# Patient Record
Sex: Female | Born: 1995 | Race: White | Hispanic: No | Marital: Single | Smoking: Never smoker
Health system: Southern US, Community
[De-identification: ages and names within clinical notes are randomized; demographics above are authoritative.]

## PROBLEM LIST (undated history)

## (undated) ENCOUNTER — Inpatient Hospital Stay (HOSPITAL_COMMUNITY): Payer: Self-pay

## (undated) ENCOUNTER — Inpatient Hospital Stay: Payer: Self-pay

## (undated) DIAGNOSIS — O139 Gestational [pregnancy-induced] hypertension without significant proteinuria, unspecified trimester: Secondary | ICD-10-CM

## (undated) DIAGNOSIS — M519 Unspecified thoracic, thoracolumbar and lumbosacral intervertebral disc disorder: Secondary | ICD-10-CM

## (undated) DIAGNOSIS — M1990 Unspecified osteoarthritis, unspecified site: Secondary | ICD-10-CM

## (undated) DIAGNOSIS — E282 Polycystic ovarian syndrome: Secondary | ICD-10-CM

## (undated) HISTORY — DX: Polycystic ovarian syndrome: E28.2

## (undated) HISTORY — DX: Unspecified osteoarthritis, unspecified site: M19.90

---
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
## 2015-04-13 ENCOUNTER — Other Ambulatory Visit: Payer: Self-pay

## 2015-04-13 DIAGNOSIS — Z3041 Encounter for surveillance of contraceptive pills: Secondary | ICD-10-CM

## 2015-04-13 MED ORDER — DESOGESTREL-ETHINYL ESTRADIOL 0.15-30 MG-MCG PO TABS: 1.0000 | ORAL_TABLET | Freq: Every day | ORAL | Status: DC

## 2015-09-27 ENCOUNTER — Ambulatory Visit (INDEPENDENT_AMBULATORY_CARE_PROVIDER_SITE_OTHER): Payer: PRIVATE HEALTH INSURANCE | Admitting: Physician Assistant

## 2015-09-27 ENCOUNTER — Encounter: Payer: Self-pay | Admitting: Physician Assistant

## 2015-09-27 VITALS — BP 110/70 | HR 111 | Temp 98.4°F | Resp 16 | Wt 167.0 lb

## 2015-09-27 DIAGNOSIS — N63 Unspecified lump in unspecified breast: Secondary | ICD-10-CM | POA: Insufficient documentation

## 2015-09-27 DIAGNOSIS — S8290XA Unspecified fracture of unspecified lower leg, initial encounter for closed fracture: Secondary | ICD-10-CM | POA: Insufficient documentation

## 2015-09-27 DIAGNOSIS — J029 Acute pharyngitis, unspecified: Secondary | ICD-10-CM | POA: Diagnosis not present

## 2015-09-27 DIAGNOSIS — Z811 Family history of alcohol abuse and dependence: Secondary | ICD-10-CM | POA: Insufficient documentation

## 2015-09-27 DIAGNOSIS — J45909 Unspecified asthma, uncomplicated: Secondary | ICD-10-CM | POA: Insufficient documentation

## 2015-09-27 LAB — POCT RAPID STREP A (OFFICE): Rapid Strep A Screen: NEGATIVE

## 2015-09-27 MED ORDER — AMOXICILLIN 875 MG PO TABS: 875.0000 mg | ORAL_TABLET | Freq: Two times a day (BID) | ORAL | Status: DC

## 2015-09-27 MED ORDER — MAGIC MOUTHWASH W/LIDOCAINE: 5.0000 mL | Freq: Three times a day (TID) | ORAL | Status: DC | PRN

## 2015-09-27 NOTE — Patient Instructions (Signed)

## 2015-09-27 NOTE — Progress Notes (Signed)
Patient: Kimberly Farmer Female    DOB: 10-06-1996   19 y.o.   MRN: [id number] Visit Date: 09/27/2015  Today's Provider: Margaretann Loveless, PA-C   Chief Complaint  Patient presents with  . Sore Throat   Subjective:    Sore Throat  This is a new problem. The current episode started in the past 7 days. The problem has been gradually worsening. There has been no fever. Associated symptoms include headaches. Pertinent negatives include no congestion, coughing, diarrhea, ear pain, shortness of breath, swollen glands, trouble swallowing or vomiting. She has tried nothing for the symptoms.   She did previously undergo surgery for fracture and dislocation of the patella of the right knee. This was done on 09/22/2015. She states that for this surgery they did not put her under complete anesthesia and she was not intubated. She is not currently on any antibiotics postoperatively. She has been trying Chloraseptic spray without relief as well as cough drops. She states that yesterday her throat hurts her back she did not even feel like speaking. She states today is a little bit better because she is able to talk but she still having difficulty drinking liquids or eating. She states that to be able to drink or eat she has to use Chloraseptic Spray prior and then she is able to drink something.    Allergies  Allergen Reactions  . Hydrocodone Other (See Comments)    Other Reaction: SEVERE HA & GI Upset  . Oxycodone Other (See Comments)    Other Reaction: SEVERE HA & GI Upset  . Tape Rash   Previous Medications   ASPIRIN EC 325 MG TABLET    TAKE 1 TABLET (325 MG TOTAL) BY MOUTH ONCE DAILY.   DESOGESTREL-ETHINYL ESTRADIOL (APRI,EMOQUETTE,SOLIA) 0.15-30 MG-MCG TABLET    Take 1 tablet by mouth daily.   IBUPROFEN (ADVIL,MOTRIN) 200 MG TABLET    Take by mouth.   LEVONORGESTREL-ETHINYL ESTRADIOL (AVIANE) 0.1-20 MG-MCG TABLET    Take by mouth.   OXYCODONE (OXY IR/ROXICODONE) 5 MG IMMEDIATE  RELEASE TABLET    TAKE 1 TABLET BY MOUTH EVERY 4 HOURS AS NEEDED FOR PAIN FOR UP TO 20 DAYS    Review of Systems  Constitutional: Negative for fever, chills and fatigue.  HENT: Positive for sore throat. Negative for congestion, ear pain, postnasal drip, rhinorrhea, sinus pressure, sneezing, tinnitus, trouble swallowing and voice change.   Eyes: Negative.   Respiratory: Negative.  Negative for cough and shortness of breath.   Cardiovascular: Negative.   Gastrointestinal: Negative for nausea, vomiting and diarrhea.  Neurological: Positive for headaches. Negative for dizziness.  All other systems reviewed and are negative.   Social History  Substance Use Topics  . Smoking status: Never Smoker   . Smokeless tobacco: Never Used  . Alcohol Use: No   Objective:   BP 110/70 mmHg  Pulse 111  Temp(Src) 98.4 F (36.9 C) (Oral)  Resp 16  Wt 167 lb (75.751 kg)  LMP 09/15/2015  Physical Exam  Constitutional: She appears well-developed and well-nourished. No distress.  HENT:  Head: Normocephalic and atraumatic.  Right Ear: Hearing, tympanic membrane, external ear and ear canal normal.  Left Ear: Hearing, tympanic membrane, external ear and ear canal normal.  Nose: Nose normal. Right sinus exhibits no maxillary sinus tenderness and no frontal sinus tenderness. Left sinus exhibits no maxillary sinus tenderness and no frontal sinus tenderness.  Mouth/Throat: Uvula is midline and mucous membranes are normal. Posterior oropharyngeal erythema present.  No oropharyngeal exudate or posterior oropharyngeal edema.  Eyes: Conjunctivae and EOM are normal. Pupils are equal, round, and reactive to light. Right eye exhibits no discharge. Left eye exhibits no discharge.  Neck: Normal range of motion. Neck supple. No JVD present. No tracheal deviation present. No Brudzinski's sign and no Kernig's sign noted. No thyromegaly present.  Cardiovascular: Normal rate, regular rhythm and normal heart sounds.  Exam  reveals no gallop and no friction rub.   No murmur heard. Pulmonary/Chest: Effort normal and breath sounds normal. No stridor. No respiratory distress. She has no wheezes. She has no rales. She exhibits no tenderness.  Lymphadenopathy:    She has no cervical adenopathy.  Skin: Skin is warm and dry.        Assessment & Plan:     1. Pharyngitis Sore throat began 2 days after her surgery on the seventh. She states that it has been worsening until today. She has tried Chloraseptic spray without relief. Rapid strep today in the office was negative. I will go ahead and prophylactically treat or bacterial pharyngitis because of her recent hospitalization. I will treat with amoxicillin as below. I will also give dukes magic mouthwash with lidocaine as below for pain. I will also check a CBC to make sure her white count is not abnormal. Did advise her to please call the office if symptoms fail to improve or worsen over the next 7-10 days. She voiced understanding. - amoxicillin (AMOXIL) 875 MG tablet; Take 1 tablet (875 mg total) by mouth 2 (two) times daily.  Dispense: 20 tablet; Refill: 0 - magic mouthwash w/lidocaine SOLN; Take 5 mLs by mouth 3 (three) times daily as needed for mouth pain.  Dispense: 120 mL; Refill: 0  2. Sore throat See above medical treatment plan. - POCT rapid strep A - CBC with Differential       Margaretann LovelessJennifer M Levana Minetti, PA-C  [facility]

## 2015-09-28 ENCOUNTER — Telehealth: Payer: Self-pay

## 2015-09-28 DIAGNOSIS — J029 Acute pharyngitis, unspecified: Secondary | ICD-10-CM

## 2015-09-28 LAB — CBC WITH DIFFERENTIAL/PLATELET
Basophils Absolute: 0 10*3/uL (ref 0.0–0.2)
Basos: 0 %
EOS (ABSOLUTE): 0 10*3/uL (ref 0.0–0.4)
Eos: 0 %
Hematocrit: 38 % (ref 34.0–46.6)
Hemoglobin: 12.3 g/dL (ref 11.1–15.9)
Immature Grans (Abs): 0 10*3/uL (ref 0.0–0.1)
Immature Granulocytes: 0 %
Lymphocytes Absolute: 3.2 10*3/uL — ABNORMAL HIGH (ref 0.7–3.1)
Lymphs: 35 %
MCH: 23.8 pg — ABNORMAL LOW (ref 26.6–33.0)
MCHC: 32.4 g/dL (ref 31.5–35.7)
MCV: 74 fL — ABNORMAL LOW (ref 79–97)
Monocytes Absolute: 1.1 10*3/uL — ABNORMAL HIGH (ref 0.1–0.9)
Monocytes: 12 %
Neutrophils Absolute: 4.7 10*3/uL (ref 1.4–7.0)
Neutrophils: 53 %
Platelets: 345 10*3/uL (ref 150–379)
RBC: 5.17 x10E6/uL (ref 3.77–5.28)
RDW: 15.2 % (ref 12.3–15.4)
WBC: 9 10*3/uL (ref 3.4–10.8)

## 2015-09-28 MED ORDER — AMOXICILLIN 400 MG/5ML PO SUSR: 400.0000 mg | Freq: Three times a day (TID) | ORAL | Status: DC

## 2015-09-28 NOTE — Telephone Encounter (Signed)
Patient's mother advised as directed below.  Thanks,  -Jimmylee Ratterree 

## 2015-09-28 NOTE — Telephone Encounter (Signed)
Hi Antony ContrasJenni, Eyvonne MechanicSamantha Helser mother Amada Kingfisher(Jennifer Blankenburg) wants to know if the bacterial infection you diagnosed  Patient yesterday is contagious, if it is where does it comes from,does it comes from the surgery, what precautions to take as family. Also wants a liquid antibiotic for patient to take because patient cannot get the Pills in. FYI: I look to see if mother was in the consent but I was not able to find her. I Advised patient about results and asked if she had any questions patient stated no.   Please advise,  Thanks,  -Joseline

## 2015-09-28 NOTE — Telephone Encounter (Signed)
Could be from surgery but unknown source.  As long as she continues to be afebrile most likely not contagious, also considered not contagious 24 hrs after starting antibiotic.  Good hand hygiene and lysol should help stopping spread.  [facility] [place]

## 2015-09-28 NOTE — Telephone Encounter (Signed)
-----   Message from Margaretann LovelessJennifer M Burnette, PA-C sent at 09/28/2015  9:13 AM EST ----- WBC is stable and WNL.

## 2015-09-28 NOTE — Telephone Encounter (Signed)
Patient advised as directed below.  Thanks,  -Joseline 

## 2016-01-05 ENCOUNTER — Other Ambulatory Visit: Payer: Self-pay | Admitting: Physician Assistant

## 2016-04-24 DIAGNOSIS — G8929 Other chronic pain: Secondary | ICD-10-CM | POA: Insufficient documentation

## 2016-04-24 DIAGNOSIS — M545 Low back pain, unspecified: Secondary | ICD-10-CM | POA: Insufficient documentation

## 2016-05-12 ENCOUNTER — Ambulatory Visit (INDEPENDENT_AMBULATORY_CARE_PROVIDER_SITE_OTHER): Payer: PRIVATE HEALTH INSURANCE | Admitting: Physician Assistant

## 2016-05-12 ENCOUNTER — Encounter: Payer: Self-pay | Admitting: Physician Assistant

## 2016-05-12 VITALS — BP 128/70 | HR 94 | Temp 98.8°F | Resp 16 | Wt 205.6 lb

## 2016-05-12 DIAGNOSIS — Z3041 Encounter for surveillance of contraceptive pills: Secondary | ICD-10-CM

## 2016-05-12 DIAGNOSIS — N83202 Unspecified ovarian cyst, left side: Secondary | ICD-10-CM | POA: Diagnosis not present

## 2016-05-12 MED ORDER — DESOGESTREL-ETHINYL ESTRADIOL 0.15-30 MG-MCG PO TABS: 1.0000 | ORAL_TABLET | Freq: Every day | ORAL | 3 refills | Status: DC

## 2016-05-12 NOTE — Progress Notes (Signed)
       Patient: Kimberly Farmer Female    DOB: [birth year]   20 y.o.   MRN: [id number] Visit Date: 05/12/2016  Today's Provider: Margaretann Loveless, PA-C   Chief Complaint  Patient presents with  . Discuss MRI results   Subjective:    HPI Patient is here to Discuss MRI results.She reports that she wants to talk about the Ovarian Cyst that was noted incidentally on the MRI. She reports her periods have always been irregular, but have been controlled on her OCP. Now she reports that she has notice more cramping during her menstrual cycle. She is not sure if there is any family history of ovarian cyst, uterine fibroids or endometriosis. No history of PCOS, uterine, cervical or ovarian cancer.    Allergies  Allergen Reactions  . Hydrocodone Other (See Comments)    Other Reaction: SEVERE HA & GI Upset  . Oxycodone Other (See Comments)    Other Reaction: SEVERE HA & GI Upset  . Tape Rash   Current Meds  Medication Sig  . desogestrel-ethinyl estradiol (APRI,EMOQUETTE,SOLIA) 0.15-30 MG-MCG tablet Take 1 tablet by mouth daily.    Review of Systems  Constitutional: Negative.   Respiratory: Negative.   Cardiovascular: Negative for chest pain, palpitations and leg swelling.  Gastrointestinal: Negative for abdominal pain, constipation, diarrhea, nausea and vomiting.  Genitourinary: Positive for menstrual problem (More cramping). Negative for dyspareunia, dysuria, flank pain, genital sores, vaginal bleeding, vaginal discharge and vaginal pain.  Musculoskeletal: Negative for back pain.    Social History  Substance Use Topics  . Smoking status: Never Smoker  . Smokeless tobacco: Never Used  . Alcohol use No   Objective:   BP 128/70 (BP Location: Left Arm, Patient Position: Sitting, Cuff Size: Normal)   Pulse 94   Temp 98.8 F (37.1 C) (Oral)   Resp 16   Wt 205 lb 9.6 oz (93.3 kg)   LMP 05/01/2016   Physical Exam  Constitutional: She is oriented to person, place, and time. She  appears well-developed and well-nourished. No distress.  Cardiovascular: Normal rate, regular rhythm and normal heart sounds.  Exam reveals no gallop and no friction rub.   No murmur heard. Pulmonary/Chest: Effort normal and breath sounds normal. No respiratory distress. She has no wheezes. She has no rales.  Abdominal: Soft. Normal appearance and bowel sounds are normal. She exhibits no distension and no mass. There is no hepatosplenomegaly. There is no tenderness. There is no rebound, no guarding and no CVA tenderness.  Neurological: She is alert and oriented to person, place, and time.  Skin: Skin is warm and dry. She is not diaphoretic.  Vitals reviewed.      Assessment & Plan:     1. Cyst of left ovary Will order future [place] for recheck in size of the ovarian cyst vs clearance. Orders were placed as below and do not need to be scheduled for 6-8 weeks. Will follow up pending results.  - US Pelvis Complete; Future - US Transvaginal Non-OB; Future  2. Encounter for surveillance of contraceptive pills Stable. Diagnosis pulled for medication refill. Continue current medical treatment plan. - desogestrel-ethinyl estradiol (APRI,EMOQUETTE,SOLIA) 0.15-30 MG-MCG tablet; Take 1 tablet by mouth daily.  Dispense: 3 Package; Refill: 3       Margaretann Loveless, PA-C  [facility]

## 2016-05-12 NOTE — Patient Instructions (Signed)
Ovarian Cyst An ovarian cyst is a fluid-filled sac that forms on an ovary. The ovaries are small organs that produce eggs in women. Various types of cysts can form on the ovaries. Most are not cancerous. Many do not cause problems, and they often go away on their own. Some may cause symptoms and require treatment. Common types of ovarian cysts include:  Functional cysts--These cysts may occur every month during the menstrual cycle. This is normal. The cysts usually go away with the next menstrual cycle if the woman does not get pregnant. Usually, there are no symptoms with a functional cyst.  Endometrioma cysts--These cysts form from the tissue that lines the uterus. They are also called "chocolate cysts" because they become filled with blood that turns brown. This type of cyst can cause pain in the lower abdomen during intercourse and with your menstrual period.  Cystadenoma cysts--This type develops from the cells on the outside of the ovary. These cysts can get very big and cause lower abdomen pain and pain with intercourse. This type of cyst can twist on itself, cut off its blood supply, and cause severe pain. It can also easily rupture and cause a lot of pain.  Dermoid cysts--This type of cyst is sometimes found in both ovaries. These cysts may contain different kinds of body tissue, such as skin, teeth, hair, or cartilage. They usually do not cause symptoms unless they get very big.  Theca lutein cysts--These cysts occur when too much of a certain hormone (human chorionic gonadotropin) is produced and overstimulates the ovaries to produce an egg. This is most common after procedures used to assist with the conception of a baby (in vitro fertilization). CAUSES   Fertility drugs can cause a condition in which multiple large cysts are formed on the ovaries. This is called ovarian hyperstimulation syndrome.  A condition called polycystic ovary syndrome can cause hormonal imbalances that can lead to  nonfunctional ovarian cysts. SIGNS AND SYMPTOMS  Many ovarian cysts do not cause symptoms. If symptoms are present, they may include:  Pelvic pain or pressure.  Pain in the lower abdomen.  Pain during sexual intercourse.  Increasing girth (swelling) of the abdomen.  Abnormal menstrual periods.  Increasing pain with menstrual periods.  Stopping having menstrual periods without being pregnant. DIAGNOSIS  These cysts are commonly found during a routine or annual pelvic exam. Tests may be ordered to find out more about the cyst. These tests may include:  Ultrasound.  X-ray of the pelvis.  CT scan.  MRI.  Blood tests. TREATMENT  Many ovarian cysts go away on their own without treatment. Your health care provider may want to check your cyst regularly for 2-3 months to see if it changes. For women in menopause, it is particularly important to monitor a cyst closely because of the higher rate of ovarian cancer in menopausal women. When treatment is needed, it may include any of the following:  A procedure to drain the cyst (aspiration). This may be done using a long needle and ultrasound. It can also be done through a laparoscopic procedure. This involves using a thin, lighted tube with a tiny camera on the end (laparoscope) inserted through a small incision.  Surgery to remove the whole cyst. This may be done using laparoscopic surgery or an open surgery involving a larger incision in the lower abdomen.  Hormone treatment or birth control pills. These methods are sometimes used to help dissolve a cyst. HOME CARE INSTRUCTIONS   Only take over-the-counter   or prescription medicines as directed by your health care provider.  Follow up with your health care provider as directed.  Get regular pelvic exams and Pap tests. SEEK MEDICAL CARE IF:   Your periods are late, irregular, or painful, or they stop.  Your pelvic pain or abdominal pain does not go away.  Your abdomen becomes  larger or swollen.  You have pressure on your bladder or trouble emptying your bladder completely.  You have pain during sexual intercourse.  You have feelings of fullness, pressure, or discomfort in your stomach.  You lose weight for no apparent reason.  You feel generally ill.  You become constipated.  You lose your appetite.  You develop acne.  You have an increase in body and facial hair.  You are gaining weight, without changing your exercise and eating habits.  You think you are pregnant. SEEK IMMEDIATE MEDICAL CARE IF:   You have increasing abdominal pain.  You feel sick to your stomach (nauseous), and you throw up (vomit).  You develop a fever that comes on suddenly.  You have abdominal pain during a bowel movement.  Your menstrual periods become heavier than usual. MAKE SURE YOU:  Understand these instructions.  Will watch your condition.  Will get help right away if you are not doing well or get worse.   This information is not intended to replace advice given to you by your health care provider. Make sure you discuss any questions you have with your health care provider.   Document Released: 10/02/2005 Document Revised: 10/07/2013 Document Reviewed: 06/09/2013 Elsevier Interactive Patient Education 2016 Elsevier Inc.  

## 2016-05-24 ENCOUNTER — Ambulatory Visit: Payer: 59

## 2016-07-26 ENCOUNTER — Encounter: Payer: Self-pay | Admitting: Physician Assistant

## 2016-07-26 ENCOUNTER — Ambulatory Visit (INDEPENDENT_AMBULATORY_CARE_PROVIDER_SITE_OTHER): Payer: PRIVATE HEALTH INSURANCE | Admitting: Physician Assistant

## 2016-07-26 VITALS — BP 110/70 | HR 68 | Temp 98.4°F | Resp 16 | Ht 66.0 in | Wt 216.0 lb

## 2016-07-26 DIAGNOSIS — N912 Amenorrhea, unspecified: Secondary | ICD-10-CM | POA: Diagnosis not present

## 2016-07-26 DIAGNOSIS — N83202 Unspecified ovarian cyst, left side: Secondary | ICD-10-CM

## 2016-07-26 LAB — POCT URINE PREGNANCY: Preg Test, Ur: NEGATIVE

## 2016-07-26 NOTE — Patient Instructions (Signed)
Primary Amenorrhea Primary amenorrhea is the absence of any menstrual flow in a female by the age of 15 years. An average age for the start of menstruation is the age of 12 years. Primary amenorrhea is not considered to have occurred until a female is older than 15 years and has never menstruated. This may occur with or without other signs of puberty. CAUSES  Some common causes of not menstruating include:  Chromosomal abnormality causing the ovaries to malfunction is the most common cause of primary amenorrhea.  Malnutrition.  Low blood sugar (hypoglycemia).  Polycystic ovary syndrome (cysts in the ovaries, not ovulating).  Absence of the vagina, uterus, or ovaries since birth (congenital).  Extreme obesity.  Cystic fibrosis.  Drastic weight loss from any cause.  Over-exercising (running, biking) causing loss of body fat.  Pituitary gland tumor in the brain.  Long-term (chronic) illnesses.  Cushing disease.  Thyroid disease (hypothyroidism, hyperthyroidism).  Part of the brain (hypothalamus) not functioning normally.  Premature ovarian failure. SYMPTOMS  No menstruation by age 15 years in normally developed females is the primary symptom. Other symptoms may include:  Discharge from the breasts.  Hot flashes.  Adult acne.  Facial or chest hair.  Headaches.  Impaired vision.  Recent stress.  Changes in weight, diet, or exercise patterns. DIAGNOSIS  Primary amenorrhea is diagnosed with the help of a medical history and a physical exam. Other tests that may be recommended include:  Blood tests to check for pregnancy, hormonal changes, a bleeding or thyroid disorder, low iron levels (anemia), or other problems.  Urine tests.  Specialized X-ray exams. TREATMENT  Treatment will depend on the cause. For example, some of the causes of primary amenorrhea, such as congenital absence of sex organs, will require surgery to correct. Others may respond to treatment  with medicine. SEEK MEDICAL CARE IF:  There has not been any menstrual flow by age 15 years.  Body maturation does not occur at a level typical of peers.  Pelvic area pain occurs.  There is unusual weight gain or hair growth.   This information is not intended to replace advice given to you by your health care provider. Make sure you discuss any questions you have with your health care provider.   Document Released: 10/02/2005 Document Revised: 10/23/2014 Document Reviewed: 05/14/2013 Elsevier Interactive Patient Education 2016 Elsevier Inc.  

## 2016-07-26 NOTE — Progress Notes (Signed)
       Patient: Kimberly MainSamantha K Mcmurry Female    DOB: 03/18/1996   20 y.o.   MRN: [id number] Visit Date: 07/26/2016  Today's Provider: Margaretann LovelessJennifer M Burnette, PA-C   Chief Complaint  Patient presents with  . Menstrual Problem   Subjective:    HPI Patient c/o of not having her period for 2 months. Patient reports last period started on 06/01/2016. Patient reports that she has done home pregnancy test and have been negative. Patient denies any symptoms today. Patient reports history of cyst on left ovary. She is taking OCP regularly.    Allergies  Allergen Reactions  . Hydrocodone Other (See Comments)    Other Reaction: SEVERE HA & GI Upset  . Oxycodone Other (See Comments)    Other Reaction: SEVERE HA & GI Upset  . Tape Rash     Current Outpatient Prescriptions:  .  desogestrel-ethinyl estradiol (APRI,EMOQUETTE,SOLIA) 0.15-30 MG-MCG tablet, Take 1 tablet by mouth daily., Disp: 3 Package, Rfl: 3 .  ibuprofen (ADVIL,MOTRIN) 200 MG tablet, Take by mouth., Disp: , Rfl:   Review of Systems  Constitutional: Negative.   Respiratory: Negative.   Cardiovascular: Negative.   Gastrointestinal: Negative.   Genitourinary: Positive for menstrual problem. Negative for dyspareunia, dysuria, flank pain, genital sores, hematuria, pelvic pain, vaginal bleeding, vaginal discharge and vaginal pain.  Musculoskeletal: Negative for back pain.    Social History  Substance Use Topics  . Smoking status: Never Smoker  . Smokeless tobacco: Never Used  . Alcohol use No   Objective:   BP 110/70 (BP Location: Left Arm, Patient Position: Sitting, Cuff Size: Large)   Pulse 68   Temp 98.4 F (36.9 C) (Oral)   Resp 16   Ht 5\' 6"  (1.676 m)   Wt 216 lb (98 kg)   LMP 06/01/2016 (Exact Date)   SpO2 99%   BMI 34.86 kg/m   Physical Exam  Constitutional: She is oriented to person, place, and time. She appears well-developed and well-nourished. No distress.  Cardiovascular: Normal rate, regular rhythm and  normal heart sounds.  Exam reveals no gallop and no friction rub.   No murmur heard. Pulmonary/Chest: Effort normal and breath sounds normal. No respiratory distress. She has no wheezes. She has no rales.  Abdominal: Soft. Normal appearance and bowel sounds are normal. She exhibits no distension and no mass. There is no hepatosplenomegaly. There is no tenderness. There is no rebound, no guarding and no CVA tenderness.  Neurological: She is alert and oriented to person, place, and time.  Skin: Skin is warm and dry. She is not diaphoretic.  Vitals reviewed.     Assessment & Plan:     1. Amenorrhea Urine pregnancy today was negative. Will still check HCG blood test. I will get US to R/O possible ovarian cysts or fibroid as cause for amenorrhea. - POCT urine pregnancy - US Pelvis Complete; Future - US Transvaginal Non-OB; Future - hCG, serum, qualitative  2. Cyst of left ovary H/O this noted on abdominal CT. Will get US to see if still present. - US Pelvis Complete; Future - US Transvaginal Non-OB; Future       Margaretann LovelessJennifer M Burnette, PA-C  [facility]

## 2016-07-27 ENCOUNTER — Telehealth: Payer: Self-pay

## 2016-07-27 LAB — HCG, SERUM, QUALITATIVE: hCG,Beta Subunit,Qual,Serum: NEGATIVE m[IU]/mL (ref <6)

## 2016-07-27 NOTE — Telephone Encounter (Signed)
-----   Message from Margaretann LovelessJennifer M Burnette, PA-C sent at 07/27/2016  8:43 AM EDT ----- Hcg blood test negative. Will f/u with US as discussed.

## 2016-07-27 NOTE — Telephone Encounter (Signed)
Advised pt of lab results. Pt verbally acknowledges understanding. Emily Drozdowski, CMA   

## 2016-07-28 ENCOUNTER — Ambulatory Visit: Payer: 59

## 2016-10-05 ENCOUNTER — Encounter: Payer: Self-pay | Admitting: Emergency Medicine

## 2016-10-05 ENCOUNTER — Emergency Department
Admission: EM | Admit: 2016-10-05 | Discharge: 2016-10-05 | Disposition: A | Discharge status: Home / Self Care | Payer: 59 | Attending: Emergency Medicine | Admitting: Emergency Medicine

## 2016-10-05 ENCOUNTER — Emergency Department: Payer: 59

## 2016-10-05 DIAGNOSIS — O200 Threatened abortion: Secondary | ICD-10-CM | POA: Diagnosis not present

## 2016-10-05 DIAGNOSIS — R102 Pelvic and perineal pain: Secondary | ICD-10-CM | POA: Insufficient documentation

## 2016-10-05 DIAGNOSIS — Z3A01 Less than 8 weeks gestation of pregnancy: Secondary | ICD-10-CM | POA: Insufficient documentation

## 2016-10-05 DIAGNOSIS — O26899 Other specified pregnancy related conditions, unspecified trimester: Secondary | ICD-10-CM

## 2016-10-05 DIAGNOSIS — O209 Hemorrhage in early pregnancy, unspecified: Secondary | ICD-10-CM | POA: Diagnosis present

## 2016-10-05 DIAGNOSIS — J45909 Unspecified asthma, uncomplicated: Secondary | ICD-10-CM | POA: Insufficient documentation

## 2016-10-05 LAB — CBC
HCT: 37.6 % (ref 35.0–47.0)
Hemoglobin: 12.2 g/dL (ref 12.0–16.0)
MCH: 25.3 pg — ABNORMAL LOW (ref 26.0–34.0)
MCHC: 32.5 g/dL (ref 32.0–36.0)
MCV: 78 fL — ABNORMAL LOW (ref 80.0–100.0)
Platelets: 313 10*3/uL (ref 150–440)
RBC: 4.82 MIL/uL (ref 3.80–5.20)
RDW: 15.5 % — ABNORMAL HIGH (ref 11.5–14.5)
WBC: 12.4 10*3/uL — ABNORMAL HIGH (ref 3.6–11.0)

## 2016-10-05 LAB — BASIC METABOLIC PANEL
Anion gap: 7 (ref 5–15)
BUN: 7 mg/dL (ref 6–20)
CO2: 25 mmol/L (ref 22–32)
Calcium: 9.3 mg/dL (ref 8.9–10.3)
Chloride: 105 mmol/L (ref 101–111)
Creatinine, Ser: 0.46 mg/dL (ref 0.44–1.00)
GFR calc Af Amer: >60 mL/min (ref >60)
GFR calc non Af Amer: >60 mL/min (ref >60)
Glucose, Bld: 97 mg/dL (ref 65–99)
Potassium: 3.5 mmol/L (ref 3.5–5.1)
Sodium: 137 mmol/L (ref 135–145)

## 2016-10-05 LAB — URINALYSIS, COMPLETE (UACMP) WITH MICROSCOPIC
Bacteria, UA: NONE SEEN
Bilirubin Urine: NEGATIVE
Glucose, UA: NEGATIVE mg/dL
Hgb urine dipstick: NEGATIVE
Ketones, ur: NEGATIVE mg/dL
Leukocytes, UA: NEGATIVE
Nitrite: NEGATIVE
Protein, ur: NEGATIVE mg/dL
RBC / HPF: NONE SEEN RBC/hpf (ref 0–5)
Specific Gravity, Urine: 1.018 (ref 1.005–1.030)
pH: 6 (ref 5.0–8.0)

## 2016-10-05 LAB — HCG, QUANTITATIVE, PREGNANCY: hCG, Beta Chain, Quant, S: 87950 m[IU]/mL — ABNORMAL HIGH (ref <5)

## 2016-10-05 LAB — ABO/RH: ABO/RH(D): O POS

## 2016-10-05 NOTE — ED Triage Notes (Signed)
Pt in with co lower abd cramping pt is 10-[redacted] weeks pregnant and has had cramping the entire pregnancy. States today pain worsened and noted small of vag bleeding.

## 2016-10-05 NOTE — ED Provider Notes (Signed)
[facility] Emergency Department Provider Note        Time seen: ----------------------------------------- 10:32 PM on 10/05/2016 -----------------------------------------    I have reviewed the triage vital signs and the nursing notes.   HISTORY  Chief Complaint Abdominal Pain    HPI Annie MainSamantha K Seiple is a 20 y.o. female who presents the ER for lower abdominal cramping in the first trimester pregnancy. Patient thinks she is around 10-[redacted] weeks pregnant has had cramping nature pregnancy. She reports a history of polycystic ovaries. She states today the pain is worsened and she noticed a small amount of vaginal bleeding. She denies fevers, chills, chest pain or other complaints.   No past medical history on file.  Patient Active Problem List   Diagnosis Date Noted  . Chronic midline low back pain without sciatica 04/24/2016  . 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 Breast lump 09/27/2015  . F/H of alcoholism 09/27/2015  . Broken leg 09/27/2015  . Contraception 04/13/2015  . Bone disease 09/24/2014  . 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 Idiopathic avascular necrosis 01/10/2012    Past Surgical History:  Procedure Laterality Date  . KNEE SURGERY Left 2011   [name] has had 5 surgeries on her knee  . TONSILLECTOMY AND ADENOIDECTOMY  2007    Allergies Hydrocodone; Oxycodone; and Tape  Social History Social History  Substance Use Topics  . Smoking status: Never Smoker  . Smokeless tobacco: Never Used  . Alcohol use No    Review of Systems Constitutional: Negative for fever. Cardiovascular: Negative for chest pain. Respiratory: Negative for shortness of breath. Gastrointestinal: Positive for abdominal pain Genitourinary: Negative for dysuria. positive for vaginal bleeding Musculoskeletal: Negative for back pain. Skin: Negative for rash. Neurological: Negative for headaches, focal weakness or numbness.  10-point ROS otherwise  negative.  ____________________________________________   PHYSICAL EXAM:  VITAL SIGNS: ED Triage Vitals  Enc Vitals Group     BP 10/05/16 2033 123/75     Pulse Rate 10/05/16 2033 98     Resp 10/05/16 2033 17     Temp 10/05/16 2033 98.1 F (36.7 C)     Temp Source 10/05/16 2033 Oral     SpO2 10/05/16 2033 100 %     Weight 10/05/16 2033 217 lb (98.4 kg)     Height 10/05/16 2033 5\' 6"  (1.676 m)     Head Circumference --      Peak Flow --      Pain Score 10/05/16 2038 5     Pain Loc --      Pain Edu? --      Excl. in GC? --     Constitutional: Alert and oriented. Well appearing and in no distress. Eyes: Conjunctivae are normal. PERRL. Normal extraocular movements. ENT   Head: Normocephalic and atraumatic.   Nose: No congestion/rhinnorhea.   Mouth/Throat: Mucous membranes are moist.   Neck: No stridor. Cardiovascular: Normal rate, regular rhythm. No murmurs, rubs, or gallops. Respiratory: Normal respiratory effort without tachypnea nor retractions. Breath sounds are clear and equal bilaterally. No wheezes/rales/rhonchi. Gastrointestinal: Soft and nontender. Normal bowel sounds Musculoskeletal: Nontender with normal range of motion in all extremities. No lower extremity tenderness nor edema. Neurologic:  Normal speech and language. No gross focal neurologic deficits are appreciated.  Skin:  Skin is warm, dry and intact. No rash noted. Psychiatric: Mood and affect are normal. Speech and behavior are normal.  ___________________________________________  ED COURSE:  Pertinent labs & imaging results that were available during my care of the  patient were reviewed by me and considered in my medical decision making (see chart for details). Clinical Course   Patient's no distress, we will assess with ultrasound and basic labs.  Procedures ____________________________________________   LABS (pertinent positives/negatives)  Labs Reviewed  CBC - Abnormal; Notable for  the following:       Result Value   WBC 12.4 (*)    MCV 78.0 (*)    MCH 25.3 (*)    RDW 15.5 (*)    All other components within normal limits  URINALYSIS, COMPLETE (UACMP) WITH MICROSCOPIC - Abnormal; Notable for the following:    Color, Urine YELLOW (*)    APPearance HAZY (*)    Squamous Epithelial / LPF 6-30 (*)    All other components within normal limits  HCG, QUANTITATIVE, PREGNANCY - Abnormal; Notable for the following:    hCG, Beta Chain, Quant, S 87,950 (*)    All other components within normal limits  BASIC METABOLIC PANEL  ABO/RH    RADIOLOGY  Pregnancy ultrasound Reveals 8 week IUP ____________________________________________  FINAL ASSESSMENT AND PLAN  Threatened miscarriage  Plan: Patient with labs and imaging as dictated above. Patient presents to the ER with single viable IUP. Her exam is very benign. Suspicion for ovarian pathology is low. She is stable for outpatient follow-up with the GYN doctor.   Emily FilbertWilliams, Jonathan E, MD   Note: This dictation was prepared with Dragon dictation. Any transcriptional errors that result from this process are unintentional    Emily FilbertJonathan E Williams, MD 10/05/16 2257

## 2016-10-10 ENCOUNTER — Ambulatory Visit (INDEPENDENT_AMBULATORY_CARE_PROVIDER_SITE_OTHER): Payer: 59 | Admitting: Obstetrics and Gynecology

## 2016-10-10 ENCOUNTER — Encounter: Payer: Self-pay | Admitting: Obstetrics and Gynecology

## 2016-10-10 VITALS — BP 112/75 | HR 93 | Wt 229.0 lb

## 2016-10-10 DIAGNOSIS — Z3491 Encounter for supervision of normal pregnancy, unspecified, first trimester: Secondary | ICD-10-CM | POA: Diagnosis not present

## 2016-10-10 DIAGNOSIS — Z3490 Encounter for supervision of normal pregnancy, unspecified, unspecified trimester: Secondary | ICD-10-CM | POA: Insufficient documentation

## 2016-10-10 DIAGNOSIS — E669 Obesity, unspecified: Secondary | ICD-10-CM

## 2016-10-10 DIAGNOSIS — O99211 Obesity complicating pregnancy, first trimester: Secondary | ICD-10-CM | POA: Diagnosis not present

## 2016-10-10 DIAGNOSIS — Z113 Encounter for screening for infections with a predominantly sexual mode of transmission: Secondary | ICD-10-CM

## 2016-10-10 DIAGNOSIS — Z6837 Body mass index (BMI) 37.0-37.9, adult: Secondary | ICD-10-CM

## 2016-10-10 DIAGNOSIS — O99210 Obesity complicating pregnancy, unspecified trimester: Secondary | ICD-10-CM

## 2016-10-10 LAB — COMPLETE METABOLIC PANEL WITH GFR
ALT: 17 U/L (ref 6–29)
AST: 19 U/L (ref 10–30)
Albumin: 4.1 g/dL (ref 3.6–5.1)
Alkaline Phosphatase: 60 U/L (ref 33–115)
BUN: 9 mg/dL (ref 7–25)
CO2: 25 mmol/L (ref 20–31)
Calcium: 8.8 mg/dL (ref 8.6–10.2)
Chloride: 103 mmol/L (ref 98–110)
Creat: 0.47 mg/dL — ABNORMAL LOW (ref 0.50–1.10)
GFR, Est African American: >89 mL/min (ref >=60)
GFR, Est Non African American: >89 mL/min (ref >=60)
Glucose, Bld: 65 mg/dL (ref 65–99)
Potassium: 4.3 mmol/L (ref 3.5–5.3)
Sodium: 136 mmol/L (ref 135–146)
Total Bilirubin: 0.2 mg/dL (ref 0.2–1.2)
Total Protein: 6.7 g/dL (ref 6.1–8.1)

## 2016-10-10 LAB — TSH: TSH: 0.96 mIU/L

## 2016-10-10 LAB — HEMOGLOBIN A1C
Hgb A1c MFr Bld: 5 % (ref <5.7)
Mean Plasma Glucose: 97 mg/dL

## 2016-10-10 LAB — OB RESULTS CONSOLE GC/CHLAMYDIA: Gonorrhea: NEGATIVE

## 2016-10-10 NOTE — Progress Notes (Addendum)
New OB Note  10/10/2016   [facility]  Chief Complaint: NOB  Transfer of Care Patient: no  History of Present Illness: [name] is a 20 y.o. [name] @ 8/5 weeks (EDC 8/1, based on 8wk u/s) Patient's last menstrual period was 07/26/2016.Preg complicated by has Airway hyperreactivity; F/H of alcoholism; Broken leg; Idiopathic avascular necrosis; Bone disease; Patellar instability; Chronic midline low back pain without sciatica; and Supervision of normal pregnancy, antepartum on her problem list.   She was using no method when she conceived.  She has Negative signs or symptoms of nausea/vomiting of pregnancy. She has Negative signs or symptoms of miscarriage or preterm labor since being seen in the ER with slight abdominal pain on 12/21.  On any different medications around the time she conceived/early pregnancy: No   ROS: A 12-point review of systems was performed and negative, except as stated in the above HPI.  OBGYN History: As per HPI. OB History  Gravida Para Term Preterm AB Living  2 0 0 0 1 0  SAB TAB Ectopic Multiple Live Births  1 0 0 0 0    # Outcome Date GA Lbr Len/2nd Weight Sex Delivery Anes PTL Lv  2 Current           1 SAB 10/16/14 681w0d    SAB         Any issues with any prior pregnancies: not applicable Any prior children are healthy, doing well, without any problems or issues: not applicable History of pap smears: No.  History of STIs: No   Past Medical History: Past Medical History:  Diagnosis Date  . Arthritis    Bilateral knees and back    Past Surgical History: Past Surgical History:  Procedure Laterality Date  . KNEE SURGERY Left 2011   [name] has had 5 surgeries on her knee  . TONSILLECTOMY AND ADENOIDECTOMY  2007    Family History:  Family History  Problem Relation Age of Onset  . Hypertension Mother   . Alcohol abuse Father   . Diabetes Maternal Grandmother   . Hypertension Maternal Grandmother   . Cancer Paternal  Grandfather     skin   She denies any female cancers, bleeding or blood clotting disorders.  She denies any history of mental retardation, birth defects or genetic disorders in her or the FOB's history  Social History:  Social History   Social History  . Marital status: Single    Spouse name: N/A  . Number of children: N/A  . Years of education: N/A   Occupational History  . Not on file.   Social History Main Topics  . Smoking status: Current Every Day Smoker    Types: E-cigarettes  . Smokeless tobacco: Never Used     Comment: Vape - Reduced Nicotine  . Alcohol use No  . Drug use: No  . Sexual activity: Yes    Partners: Male    Birth control/ protection: None   Other Topics Concern  . Not on file   Social History Narrative  . No narrative on file    Allergy: Allergies  Allergen Reactions  . Hydrocodone Other (See Comments)    Other Reaction: SEVERE HA & GI Upset  . Oxycodone Other (See Comments)    Other Reaction: SEVERE HA & GI Upset  . Tape Rash    Health Maintenance:  Mammogram Up to Date: not applicable  Current Outpatient Medications: PNV  Physical Exam:   BP 112/75   Pulse  93   Wt 229 lb (103.9 kg)   LMP 07/26/2016   BMI 36.96 kg/m  Body mass index is 36.96 kg/m. Fundal height: not applicable FHTs: 170s on bedside u/s  General appearance: Well nourished, well developed female in no acute distress.  Neck:  Supple, normal appearance, and no thyromegaly  Cardiovascular: S1, S2 normal, no murmur, rub or gallop, regular rate and rhythm Respiratory:  Clear to auscultation bilateral. Normal respiratory effort Abdomen: positive bowel sounds and no masses, hernias; diffusely non tender to palpation, non distended Neuro/Psych:  Normal mood and affect.  Skin:  Warm and dry.   Laboratory: none  Imaging:  [facility] ER u/s reviewed FHTs: 170s on bedside u/s  Assessment: pt doing well  Plan: 1. Encounter for supervision of normal pregnancy,  antepartum, unspecified gravidity Routine labs. Pt considering genetic screening after options d/w her.   2. BMI 37.0-37.9, adult Baseline pre-x labs and TSH. Consider baby ASA after 12wks. Weight gain during pregnancy d/w pt and goal for 20-25lbs  3. Obesity in pregnancy See above  Problem list reviewed and updated.  Follow up in 4 weeks.  >50% of 20 min visit spent on counseling and coordination of care.     Cornelia Copaharlie Keene Gilkey, Jr. MD Attending [facility](Faculty Practice)

## 2016-10-11 ENCOUNTER — Encounter: Payer: Self-pay | Admitting: Obstetrics and Gynecology

## 2016-10-11 DIAGNOSIS — Z6836 Body mass index (BMI) 36.0-36.9, adult: Secondary | ICD-10-CM | POA: Insufficient documentation

## 2016-10-11 DIAGNOSIS — O99211 Obesity complicating pregnancy, first trimester: Secondary | ICD-10-CM | POA: Insufficient documentation

## 2016-10-11 DIAGNOSIS — Z6837 Body mass index (BMI) 37.0-37.9, adult: Secondary | ICD-10-CM | POA: Insufficient documentation

## 2016-10-11 LAB — PAIN MGMT, PROFILE 6 CONF W/O MM, U
6 Acetylmorphine: NEGATIVE ng/mL (ref <10)
Alcohol Metabolites: NEGATIVE ng/mL (ref <500)
Amphetamines: NEGATIVE ng/mL (ref <500)
Barbiturates: NEGATIVE ng/mL (ref <300)
Benzodiazepines: NEGATIVE ng/mL (ref <100)
Cocaine Metabolite: NEGATIVE ng/mL (ref <150)
Creatinine: 75.7 mg/dL (ref >=20.0)
Marijuana Metabolite: NEGATIVE ng/mL (ref <20)
Methadone Metabolite: NEGATIVE ng/mL (ref <100)
Opiates: NEGATIVE ng/mL (ref <100)
Oxidant: NEGATIVE ug/mL (ref <200)
Oxycodone: NEGATIVE ng/mL (ref <100)
Phencyclidine: NEGATIVE ng/mL (ref <25)
Please note:: 0
pH: 6.74 (ref 4.5–9.0)

## 2016-10-11 LAB — PROTEIN / CREATININE RATIO, URINE
Creatinine, Urine: 92 mg/dL (ref 20–320)
Protein Creatinine Ratio: 87 mg/g creat (ref 21–161)
Total Protein, Urine: 8 mg/dL (ref 5–24)

## 2016-10-12 ENCOUNTER — Telehealth: Payer: Self-pay | Admitting: *Deleted

## 2016-10-12 DIAGNOSIS — Z3490 Encounter for supervision of normal pregnancy, unspecified, unspecified trimester: Secondary | ICD-10-CM

## 2016-10-12 LAB — GC/CHLAMYDIA PROBE AMP (~~LOC~~) NOT AT ARMC
Chlamydia: NEGATIVE
Neisseria Gonorrhea: NEGATIVE

## 2016-10-12 NOTE — Telephone Encounter (Signed)
Spoke to pt this morning and she opted for NT scan due to first cousin with down syndrome. Order placed and sent to scheduler to call pt with appt time.

## 2016-10-16 NOTE — L&D Delivery Note (Signed)
20 y.o. [name] at 3855w3d delivered a viable female infant in cephalic, LOA position. Nuchal cord x1, baby delivered through the cord and it was subsequently easily reduced. Anterior shoulder delivered. Cord clamped x2 and cut. Placenta delivered spontaneously intact, with 3VC. Fundus firm on exam with massage and pitocin. Good hemostasis noted.  Baby noted to have APGAR 0. Code APGAR called. Baby taken to warmer immediately and NICU team at bedside.   Anesthesia: Epidural Laceration: None Suture: None  Good hemostasis noted. EBL: 200cc  Mom and baby recovering in LDR.    Apgars: APGAR (1 MIN): 0   APGAR (5 MINS):  3 APGAR (10 MINS):  7 Weight: Pending skin to skin  Sponge and instrument count were correct x2. Placenta sent to pathology  Howard PouchLauren Feng, MD PGY-2 Family Medicine 04/28/2017, 5:19 AM  Patient is a [name] at 7155w3d who was admitted for IOL due to gHTN , but otherwise mostly uncomplicated prenatal course.  She progressed with augmentation via cytotec, Pit, AROM.  I was gloved and present for delivery in its entirety.  Second stage of labor progressed over a lengthy amount of time with becoming complete at 0100 and laboring down/pushing with min effectiveness for a significant amount of time, baby eventually delivered. Mod decels during second stage noted.  Complications: Peds team called due to unresponsive infant initially; infant taken to NICU for further eval  Lacerations: none  EBL: 200cc  Cord pH: 7.22  Kimberly Farmer, CNM 9:30 AM 04/28/2017

## 2016-10-18 ENCOUNTER — Encounter: Payer: Self-pay | Admitting: *Deleted

## 2016-10-18 LAB — CYSTIC FIBROSIS DIAGNOSTIC STUDY

## 2016-10-23 ENCOUNTER — Telehealth: Payer: Self-pay | Admitting: *Deleted

## 2016-10-23 ENCOUNTER — Ambulatory Visit (INDEPENDENT_AMBULATORY_CARE_PROVIDER_SITE_OTHER): Payer: 59 | Admitting: Obstetrics & Gynecology

## 2016-10-23 VITALS — BP 121/73 | HR 87 | Wt 233.0 lb

## 2016-10-23 DIAGNOSIS — O209 Hemorrhage in early pregnancy, unspecified: Secondary | ICD-10-CM

## 2016-10-23 DIAGNOSIS — Z3481 Encounter for supervision of other normal pregnancy, first trimester: Secondary | ICD-10-CM

## 2016-10-23 NOTE — Progress Notes (Signed)
   PRENATAL VISIT NOTE  Subjective:  Kimberly Farmer is a 21 y.o. [name] at 4639w5d being seen today for evaluation of episode of vaginal bleeding.  She states she experienced two episodes of bright red bleeding yesterday and passed a clot the size of a fifty cent piece.  No current bleeding, pain, abnormal vaginal discharge or other concerns.  Denies leaking of fluid.   She is currently monitored for the following issues for this low-risk pregnancy and has Airway hyperreactivity; F/H of alcoholism; Broken leg; Idiopathic avascular necrosis; Bone disease; Patellar instability; Chronic midline low back pain without sciatica; Supervision of normal pregnancy, antepartum; Obesity affecting pregnancy in first trimester; and BMI 37.0-37.9, adult on her problem list.  The following portions of the patient's history were reviewed and updated as appropriate: allergies, current medications, past family history, past medical history, past social history, past surgical history and problem list. Problem list updated.  Objective:   Vitals:   10/23/16 1359  BP: 121/73  Pulse: 87  Weight: 233 lb (105.7 kg)    Fetal Status:     Movement: Absent    +FHR on bedside ultrasound.  General:  Alert, oriented and cooperative. Patient is in no acute distress.  Skin: Skin is warm and dry. No rash noted.   Cardiovascular: Normal heart rate noted  Respiratory: Normal respiratory effort, no problems with respiration noted  Abdomen: Soft, gravid, appropriate for gestational age. Pain/Pressure: Absent     Pelvic:  Speculum exam done, no bleeding noted. Closed cervix. Normal vaginal discharge noted.         Extremities: Normal range of motion.  Edema: None  Mental Status: Normal mood and affect. Normal behavior. Normal judgment and thought content.   Assessment and Plan:  Pregnancy: [name] at 8539w5d  1. Vaginal bleeding in pregnancy, first trimester Threatened miscarriage. Viable fetus seen. Patient reassured.  Bleeding precautions reviewed.   2. Encounter for supervision of other normal pregnancy in first trimester Will draw missing labs from initial OB visit. - Prenatal Profile I - Hemoglobinopathy evaluation - Culture, OB Urine  No other complaints or concerns.  Routine obstetric precautions reviewed. Please refer to After Visit Summary for other counseling recommendations.  Follow up as scheduled on 11/07/2016 for 12 week Babyscripts visit or earlier if indicated.  Tereso NewcomerUgonna A Shiheem Corporan, MD

## 2016-10-23 NOTE — Telephone Encounter (Signed)
-----   Message from [place] Bingharlie Pickens, MD sent at 10/19/2016  8:55 PM EST ----- I don't see her prenatal panel or her hemoglobinopathy panel resulted. Can y'all find out why? thanks

## 2016-10-23 NOTE — Telephone Encounter (Signed)
Informed pt of results, states she experienced two episodes of bright red bleeding yesterday and passed a clot the size of a fifty cent piece. Scheduled appt today for evaluation.

## 2016-10-23 NOTE — Patient Instructions (Signed)
Return to clinic for any scheduled appointments or obstetric concerns, or go to MAU for evaluation  

## 2016-10-25 ENCOUNTER — Encounter: Payer: Self-pay | Admitting: Obstetrics & Gynecology

## 2016-10-25 DIAGNOSIS — Z283 Underimmunization status: Secondary | ICD-10-CM | POA: Insufficient documentation

## 2016-10-25 DIAGNOSIS — O09899 Supervision of other high risk pregnancies, unspecified trimester: Secondary | ICD-10-CM | POA: Insufficient documentation

## 2016-10-25 DIAGNOSIS — O9989 Other specified diseases and conditions complicating pregnancy, childbirth and the puerperium: Secondary | ICD-10-CM

## 2016-10-25 LAB — URINE CULTURE, OB REFLEX

## 2016-10-25 LAB — CULTURE, OB URINE

## 2016-10-25 NOTE — Progress Notes (Signed)
So [facility] and I will not be able to add it on due to something about the tubes have been opened, not really sure about that rule, guess its for contamination reasons so we will need to redraw.  Is it okay if we do this the next time she needs blood drawn like with her AFP?

## 2016-10-27 LAB — PRENATAL PROFILE I(LABCORP)
Antibody Screen: NEGATIVE
Basophils Absolute: 0 10*3/uL (ref 0.0–0.2)
Basos: 0 %
EOS (ABSOLUTE): 0 10*3/uL (ref 0.0–0.4)
Eos: 0 %
Hematocrit: 36.9 % (ref 34.0–46.6)
Hemoglobin: 11.9 g/dL (ref 11.1–15.9)
Hepatitis B Surface Ag: NEGATIVE
Immature Grans (Abs): 0 10*3/uL (ref 0.0–0.1)
Immature Granulocytes: 0 %
Lymphocytes Absolute: 2.7 10*3/uL (ref 0.7–3.1)
Lymphs: 29 %
MCH: 25.4 pg — ABNORMAL LOW (ref 26.6–33.0)
MCHC: 32.2 g/dL (ref 31.5–35.7)
MCV: 79 fL (ref 79–97)
Monocytes Absolute: 0.7 10*3/uL (ref 0.1–0.9)
Monocytes: 8 %
Neutrophils Absolute: 5.7 10*3/uL (ref 1.4–7.0)
Neutrophils: 63 %
Platelets: 323 10*3/uL (ref 150–379)
RBC: 4.69 x10E6/uL (ref 3.77–5.28)
RDW: 15.8 % — ABNORMAL HIGH (ref 12.3–15.4)
RPR Ser Ql: NONREACTIVE
Rh Factor: POSITIVE
Rubella Antibodies, IGG: <0.9 index — ABNORMAL LOW (ref >0.99)
WBC: 9.3 10*3/uL (ref 3.4–10.8)

## 2016-10-27 LAB — HEMOGLOBINOPATHY EVALUATION
HGB C: 0 %
HGB S: 0 %
HGB VARIANT: 0 %
Hemoglobin A2 Quantitation: 2.5 % (ref 1.8–3.2)
Hemoglobin F Quantitation: 0 % (ref 0.0–2.0)
Hgb A: 97.5 % (ref 96.4–98.8)

## 2016-10-31 ENCOUNTER — Encounter (HOSPITAL_COMMUNITY): Payer: Self-pay | Admitting: Obstetrics and Gynecology

## 2016-11-03 ENCOUNTER — Ambulatory Visit (INDEPENDENT_AMBULATORY_CARE_PROVIDER_SITE_OTHER): Payer: 59 | Admitting: Obstetrics & Gynecology

## 2016-11-03 VITALS — BP 115/81 | HR 99 | Wt 237.0 lb

## 2016-11-03 DIAGNOSIS — Z3490 Encounter for supervision of normal pregnancy, unspecified, unspecified trimester: Secondary | ICD-10-CM

## 2016-11-03 DIAGNOSIS — E669 Obesity, unspecified: Secondary | ICD-10-CM

## 2016-11-03 DIAGNOSIS — O99211 Obesity complicating pregnancy, first trimester: Secondary | ICD-10-CM

## 2016-11-03 NOTE — Progress Notes (Signed)
   PRENATAL VISIT NOTE  Subjective:  Kimberly Farmer is a 21 y.o. [name] at 5260w2d being seen today for ongoing prenatal care.  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  Patient reports no complaints. She fell on the ice at work today. Denies bleeding. Contractions: Not present. Vag. Bleeding: None.  Movement: Absent. Denies leaking of fluid.   The following portions of the patient's history were reviewed and updated as appropriate: allergies, current medications, past family history, past medical history, past social history, past surgical history and problem list. Problem list updated.  Objective:   Vitals:   11/03/16 0858  BP: 115/81  Pulse: 99  Weight: 237 lb (107.5 kg)    Fetal Status: Fetal Heart Rate (bpm): 156   Movement: Absent     General:  Alert, oriented and cooperative. Patient is in no acute distress.  Skin: Skin is warm and dry. No rash noted.   Cardiovascular: Normal heart rate noted  Respiratory: Normal respiratory effort, no problems with respiration noted  Abdomen: Soft, gravid, appropriate for gestational age. Pain/Pressure: Absent     Pelvic:  Cervical exam deferred        Extremities: Normal range of motion.  Edema: None  Mental Status: Normal mood and affect. Normal behavior. Normal judgment and thought content.   Assessment and Plan:  Pregnancy: [name] at 5260w2d  1. Obesity affecting pregnancy in first trimester - She has already gained the entire rec'd weight for the pregnancy. We discussed the increased risk of stillbirth with increasing weight gain. She has agreed to  see a nutritionist  2. Encounter for supervision of normal pregnancy, antepartum, unspecified gravidity  - US MFM OB COMP + 14 WK; Future  Preterm labor symptoms and general obstetric precautions including but not limited to vaginal bleeding, contractions, leaking of fluid and fetal movement were reviewed in detail with the patient. Please refer to After Visit Summary for other counseling recommendations.  Return in about 8 weeks (around 12/29/2016).   Allie BossierMyra C Laelynn Blizzard, MD

## 2016-11-03 NOTE — Progress Notes (Signed)
Pt here today for OB follow-up, states she slipped and fell on ice this morning landing on her left side, predominately the hip area, denies any pain or vaginal bleeding.  Informed pt to go to MAU if she starts to experience heavy vaginal bleeding.

## 2016-11-03 NOTE — Progress Notes (Signed)
   PRENATAL VISIT NOTE  Subjective:  Kimberly Farmer is a 21 y.o. [name] at 7421w2d being seen today for ongoing prenatal care.  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  Patient reports no complaints.   .  .   . Denies leaking of fluid.   The following portions of the patient's history were reviewed and updated as appropriate: allergies, current medications, past family history, past medical history, past social history, past surgical history and problem list. Problem list updated.  Objective:  There were no vitals filed for this visit.  Fetal Status:           General:  Alert, oriented and cooperative. Patient is in no acute distress.  Skin: Skin is warm and dry. No rash noted.   Cardiovascular: Normal heart rate noted  Respiratory: Normal respiratory effort, no problems with respiration noted  Abdomen: Soft, gravid, appropriate for gestational age.       Pelvic:  Cervical exam deferred        Extremities: Normal range of motion.     Mental Status: Normal mood and affect. Normal behavior. Normal judgment and thought content.   Assessment and Plan:  Pregnancy: [name] at 5721w2d  1. Obesity affecting pregnancy in first trimester - nutrition referral, discussed risk of stillbirth due to obesity and excess weight gain  2. Encounter for supervision of normal pregnancy, antepartum, unspecified gravidity - anatomy u/s at [redacted] weeks EGA  Preterm labor symptoms and general obstetric precautions including but not limited to vaginal bleeding, contractions, leaking of fluid and fetal movement were reviewed in  detail with the patient. Please refer to After Visit Summary for other counseling recommendations.  No Follow-up on file.   Allie BossierMyra C Ade Stmarie, MD

## 2016-11-07 ENCOUNTER — Encounter: Payer: 59 | Admitting: Obstetrics & Gynecology

## 2016-11-08 ENCOUNTER — Ambulatory Visit (HOSPITAL_COMMUNITY)
Admission: RE | Admit: 2016-11-08 | Discharge: 2016-11-08 | Disposition: A | Discharge status: Home / Self Care | Payer: 59 | Source: Ambulatory Visit | Attending: Obstetrics and Gynecology | Admitting: Obstetrics and Gynecology

## 2016-11-08 ENCOUNTER — Other Ambulatory Visit: Payer: Self-pay

## 2016-11-08 ENCOUNTER — Encounter (HOSPITAL_COMMUNITY): Payer: Self-pay

## 2016-11-08 ENCOUNTER — Other Ambulatory Visit: Payer: Self-pay | Admitting: Obstetrics and Gynecology

## 2016-11-08 DIAGNOSIS — Z3490 Encounter for supervision of normal pregnancy, unspecified, unspecified trimester: Secondary | ICD-10-CM

## 2016-11-08 DIAGNOSIS — Z3682 Encounter for antenatal screening for nuchal translucency: Secondary | ICD-10-CM

## 2016-11-08 DIAGNOSIS — Z3A13 13 weeks gestation of pregnancy: Secondary | ICD-10-CM | POA: Insufficient documentation

## 2016-11-08 DIAGNOSIS — O99211 Obesity complicating pregnancy, first trimester: Secondary | ICD-10-CM | POA: Insufficient documentation

## 2016-11-08 HISTORY — DX: Unspecified thoracic, thoracolumbar and lumbosacral intervertebral disc disorder: M51.9

## 2016-11-10 ENCOUNTER — Ambulatory Visit: Payer: 59 | Admitting: *Deleted

## 2016-11-10 ENCOUNTER — Ambulatory Visit: Payer: 59 | Admitting: Skilled Nursing Facility1

## 2016-11-10 ENCOUNTER — Encounter: Payer: Medicaid Other | Attending: Obstetrics & Gynecology | Admitting: *Deleted

## 2016-11-10 ENCOUNTER — Ambulatory Visit (HOSPITAL_COMMUNITY)
Admission: RE | Admit: 2016-11-10 | Discharge: 2016-11-10 | Disposition: A | Discharge status: Home / Self Care | Payer: Medicaid Other | Source: Ambulatory Visit | Attending: Physician Assistant | Admitting: Physician Assistant

## 2016-11-10 DIAGNOSIS — Z6837 Body mass index (BMI) 37.0-37.9, adult: Secondary | ICD-10-CM | POA: Insufficient documentation

## 2016-11-10 DIAGNOSIS — Z713 Dietary counseling and surveillance: Secondary | ICD-10-CM | POA: Insufficient documentation

## 2016-11-10 DIAGNOSIS — Z3A13 13 weeks gestation of pregnancy: Secondary | ICD-10-CM | POA: Diagnosis not present

## 2016-11-10 DIAGNOSIS — E669 Obesity, unspecified: Secondary | ICD-10-CM | POA: Diagnosis not present

## 2016-11-10 DIAGNOSIS — O99211 Obesity complicating pregnancy, first trimester: Secondary | ICD-10-CM | POA: Diagnosis present

## 2016-11-10 NOTE — Progress Notes (Signed)
Medical Nutrition Therapy:  Appt start time: 1400 end time:  1500.   Assessment:  Primary concerns today: Patient was scheduled for Nutrition Consult for Obesity with Pregnancy at the [facility] office today at 1:00 PM. [facility], [facility] She did not have time to make her appointment at the original location so I offered to see her here today instead.   She is currently [redacted] weeks pregnant and her weight last week at her OB office was 237 pounds with BMI documented at 37.0. She states she has had multiple knee surgeries resulting in several months of bedrest with weight gain. She states she followed a 500 calorie per day diet too lose weight and stopped when she found out she was pregnant. She has gained 15 pounds since she resumed her normal eating habits which include many convenience foods and fast foods. 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 She states this gives her limited time to cook at home or to exercise.  Preferred Learning Style:No preference indicated   Learning Readiness: Ready  Change in progress   MEDICATIONS: see list   DIETARY INTAKE:  Usual eating pattern includes 3 meals and 1-2 snacks per day.  Everyday foods include sweetened beverages, some higher fat foods, some vegetables and lean meats.  Avoided foods include bread.    24-hr recall:  B ( AM): Apple juice, PNB crackers or snack cake  Snk ( AM): no  L ( PM): fast food chicken sandwich with diluted sweet tea or gatoade Snk ( PM): no D ( PM): meat, starch and vegetables, gatorade Snk ( PM): small glass of milk Beverages: half and half sweet tea, gatorade, water, apple juice  Usual physical activity: not lately due to limited time with full time  job and other responsibilities  Estimated energy needs during pregnancy for appropriate weight gain: 2100 calories 235 g carbohydrates 158 g protein 58 g fat   Provided meal plan contaiing about 1800 calories, 200 g carbohydrate, 135 g protein and 50 g fat to allow for weight maintenance or possible loss of 0.5 pounds per week with added exercise..   Progress Towards Goal(s):  Some progress.   Nutritional Diagnosis:  NI-1.5 Excessive energy intake As related to activity level.  As evidenced by BMI of 37.0.    Intervention:  Nutrition counseling initiated. Discussed Carb Counting by food group as method of portion control, reading food labels, and benefits of increased activity. Also discussed importance of eating a variety of foods instead of calorie counting. Encouraged ways for her to prepare more foods from home to save money as well as improved nutrition. Suggested her boyfriend take his food with him to work and she use that time in the evenings to either prepare healthier meals as well as increase her activity level during that time.   Teaching Method Utilized:  Visual Auditory Hands on  Handouts given during visit include: Carb Counting and Food Label handouts Arm Chair Exercise video suggeations  Calorie [employer] APP for her phone  Barriers to learning/adherence to lifestyle change: very afraid of risks to baby with her own obesity as well as fear of how much she should limit her food and make sure her baby gets adequate nutrition  from her.   Demonstrated degree of understanding via:  Teach Back   Monitoring/Evaluation:  Dietary intake, exercise, reading food labels, and body weight prn  Patient aware that I saw her off site and she has my card to follow up at our [facility] office in the future as needed. Marland Kitchen

## 2016-11-13 ENCOUNTER — Other Ambulatory Visit: Payer: Self-pay

## 2016-11-15 ENCOUNTER — Telehealth: Payer: Self-pay | Admitting: *Deleted

## 2016-11-15 MED ORDER — CYCLOBENZAPRINE HCL 10 MG PO TABS: 10.0000 mg | ORAL_TABLET | Freq: Three times a day (TID) | ORAL | 1 refills | Status: DC | PRN

## 2016-11-15 NOTE — Telephone Encounter (Signed)
-----   Message from Lindell SparHeather L Bacon, [place] sent at 11/15/2016  2:25 PM EST ----- Regarding: pt has headache and seeing spots for 2 days Contact: [phone] Pt is concerned and wanted to speak w nurse about seeing spots and having a headache, requesting a call back please  Thanks

## 2016-11-15 NOTE — Telephone Encounter (Signed)
Sent Flexeril to pharmacy for headache per v/o Gildardo Griffes Pratt, MD

## 2016-11-16 NOTE — Telephone Encounter (Signed)
Mandy spoke to physician and sent Flexeril to pharmacy for pt to take and see if that would help with her symptoms.

## 2016-11-17 ENCOUNTER — Telehealth: Payer: Self-pay | Admitting: *Deleted

## 2016-11-17 DIAGNOSIS — O99210 Obesity complicating pregnancy, unspecified trimester: Secondary | ICD-10-CM

## 2016-11-20 NOTE — Telephone Encounter (Signed)
error 

## 2016-11-30 ENCOUNTER — Ambulatory Visit (INDEPENDENT_AMBULATORY_CARE_PROVIDER_SITE_OTHER): Payer: 59 | Admitting: Student

## 2016-11-30 VITALS — BP 115/78 | HR 112 | Wt 241.0 lb

## 2016-11-30 DIAGNOSIS — Z3490 Encounter for supervision of normal pregnancy, unspecified, unspecified trimester: Secondary | ICD-10-CM

## 2016-11-30 DIAGNOSIS — Z3492 Encounter for supervision of normal pregnancy, unspecified, second trimester: Secondary | ICD-10-CM

## 2016-11-30 NOTE — Patient Instructions (Signed)
Intrauterine Device Information Introduction An intrauterine device (IUD) is a medical device that gets inserted into the uterus to prevent pregnancy. It is a small, T-shaped device that has one or two nylon strings hanging down from it. The strings hang out of the lower part of the uterus (cervix) to allow for future IUD removal. There are two types of IUDs available:  Copper IUD. This type of IUD has copper wire wrapped around it. A copper IUD may last up to 10 years.  Hormone IUD. This type of IUD is made of plastic and contains the hormone progestin (synthetic progesterone). A hormone IUD may last 3 to 5 years. IUDs are inserted through the vagina and placed into the uterus with a minor medical procedure. How does the IUD work? Copper in the copper IUD prevents pregnancy by making the uterus and fallopian tubes produce a fluid that kills sperm. Synthetic progesterone in hormonal IUD prevents pregnancy by:  Thickening cervical mucus to prevent sperm from entering the uterus.  Thinning the uterine lining to prevent implantation of a fertilized egg.  Weakening or killing sperm that get into the uterus. What are the advantages of an IUD?  IUDs are highly effective, reversible, long-acting, and low-maintenance.  There are no estrogen-related side effects.  An IUD can be used when breastfeeding.  IUDs are not associated with weight gain.  Advantages of the copper IUD are that:  It works immediately after insertion.  It does not interfere with your body's natural hormones.  It can be used for 10 years.  Advantages of the hormone IUD are that:  If it is inserted within 7 days of your period starting, it works immediately after insertion. If the hormone IUD is inserted at any other time in your cycle, you will need to use a backup method of birth control for 7 days after insertion.  It can make menstrual periods lighter.  It can decrease menstrual cramping.  It can be used for 3  or 5 years. What are the disadvantages of an IUD?  The hormone IUD may cause irregular menstrual bleeding for a period of time after insertion.  The copper IUD can make your menstrual flow heavier and more painful.  You may experience some pain during insertion, and cramping and vaginal bleeding after insertion. How is the IUD removed? Is the IUD right for me?  Your health care provider will make sure you are a good candidate for an IUD and will discuss side effects with you. This information is not intended to replace advice given to you by your health care provider. Make sure you discuss any questions you have with your health care provider. Document Released: 09/05/2004 Document Revised: 03/09/2016 Document Reviewed: 03/23/2013  2017 Elsevier  

## 2016-11-30 NOTE — Progress Notes (Signed)
   PRENATAL VISIT NOTE  Subjective:  Kimberly Farmer is a 21 y.o. [name] at 4220w1d being seen today for ongoing prenatal care.  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  Patient reports no complaints.  Contractions: Not present. Vag. Bleeding: None.  Movement: Absent. Denies leaking of fluid.   The following portions of the patient's history were reviewed and updated as appropriate: allergies, current medications, past family history, past medical history, past social history, past surgical history and problem list. Problem list updated.  Objective:   Vitals:   11/30/16 1309  BP: 115/78  Pulse: (!) 112  Weight: 241 lb (109.3 kg)    Fetal Status: Fetal Heart Rate (bpm): 156   Movement: Absent     General:  Alert, oriented and cooperative. Patient is in no acute distress.  Skin: Skin is warm and dry. No rash noted.   Cardiovascular: Normal heart rate noted  Respiratory: Normal respiratory effort, no problems with respiration noted  Abdomen: Soft, gravid, appropriate for gestational age. Pain/Pressure: Present     Pelvic:  Cervical exam deferred        Extremities: Normal range of motion.  Edema: None  Mental Status: Normal mood and affect. Normal behavior. Normal judgment and thought content.   Assessment and Plan:  Pregnancy: [name] at 6720w1d  1. Encounter for supervision of normal pregnancy, antepartum, unspecified gravidity -Discussed diet and exercise; encouraged her to do prenatal yoga or work out at home   - HIV antibody (with reflex) - AFP, Serum, Open Spina Bifida  Preterm labor  symptoms and general obstetric precautions including but not limited to vaginal bleeding, contractions, leaking of fluid and fetal movement were reviewed in detail with the patient. Please refer to After Visit Summary for other counseling recommendations.  Return in about 4 weeks (around 12/28/2016).   Kimberly Farmer, CNM

## 2016-12-01 LAB — AFP, SERUM, OPEN SPINA BIFIDA

## 2016-12-01 LAB — HIV ANTIBODY (ROUTINE TESTING W REFLEX): HIV Screen 4th Generation wRfx: NONREACTIVE

## 2016-12-07 LAB — AFP, SERUM, OPEN SPINA BIFIDA
AFP MoM: 0.56
AFP Value: 23.2 ng/mL
Gest. Age on Collection Date: 16.1 weeks
Maternal Age At EDD: 20.8 years
OSBR Risk 1 IN: 10000
Test Results:: NEGATIVE
Weight: 109 [lb_av]

## 2016-12-13 ENCOUNTER — Ambulatory Visit (HOSPITAL_COMMUNITY)
Admission: RE | Admit: 2016-12-13 | Discharge: 2016-12-13 | Disposition: A | Discharge status: Home / Self Care | Payer: Medicaid Other | Source: Ambulatory Visit | Attending: Physician Assistant | Admitting: Physician Assistant

## 2016-12-13 ENCOUNTER — Other Ambulatory Visit: Payer: Self-pay | Admitting: Obstetrics & Gynecology

## 2016-12-13 DIAGNOSIS — Z3A18 18 weeks gestation of pregnancy: Secondary | ICD-10-CM

## 2016-12-13 DIAGNOSIS — Z363 Encounter for antenatal screening for malformations: Secondary | ICD-10-CM

## 2016-12-13 DIAGNOSIS — O99212 Obesity complicating pregnancy, second trimester: Secondary | ICD-10-CM

## 2016-12-13 DIAGNOSIS — Z3490 Encounter for supervision of normal pregnancy, unspecified, unspecified trimester: Secondary | ICD-10-CM

## 2016-12-14 ENCOUNTER — Other Ambulatory Visit (HOSPITAL_COMMUNITY): Payer: Self-pay | Admitting: *Deleted

## 2016-12-14 DIAGNOSIS — O283 Abnormal ultrasonic finding on antenatal screening of mother: Secondary | ICD-10-CM

## 2016-12-28 ENCOUNTER — Encounter: Payer: 59 | Admitting: Student

## 2017-01-05 ENCOUNTER — Encounter: Payer: 59 | Admitting: Obstetrics and Gynecology

## 2017-01-11 ENCOUNTER — Ambulatory Visit (INDEPENDENT_AMBULATORY_CARE_PROVIDER_SITE_OTHER): Payer: 59 | Admitting: Student

## 2017-01-11 DIAGNOSIS — Z3400 Encounter for supervision of normal first pregnancy, unspecified trimester: Secondary | ICD-10-CM

## 2017-01-11 DIAGNOSIS — Z3482 Encounter for supervision of other normal pregnancy, second trimester: Secondary | ICD-10-CM

## 2017-01-11 NOTE — Patient Instructions (Signed)

## 2017-01-11 NOTE — Progress Notes (Signed)
   PRENATAL VISIT NOTE  Subjective:  Kimberly Farmer is a 21 y.o. [name] at 7075w1d being seen today for ongoing prenatal care.  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  Patient reports no complaints.  Contractions: Not present.  .  Movement: Present. Denies leaking of fluid.   The following portions of the patient's history were reviewed and updated as appropriate: allergies, current medications, past family history, past medical history, past social history, past surgical history and problem list. Problem list updated.  Objective:   Vitals:   01/11/17 1510  BP: 114/78  Pulse: (!) 103  Weight: 116.1 kg (256 lb)    Fetal Status: Fetal Heart Rate (bpm): 154   Movement: Present     General:  Alert, oriented and cooperative. Patient is in no acute distress.  Skin: Skin is warm and dry. No rash noted.   Cardiovascular: Normal heart rate noted  Respiratory: Normal respiratory effort, no problems with respiration noted  Abdomen: Soft, gravid, appropriate for gestational age.       Pelvic:  Cervical exam deferred        Extremities: Normal range of motion.  Edema: None  Mental Status: Normal mood and affect. Normal behavior. Normal judgment and thought content.   Assessment and Plan:  Pregnancy: [name] at 8375w1d  1. Supervision of normal first pregnancy, antepartum -follow up US scheduled for CP and pyelactasis on April 11 -patient knows to start using the Baby scripts app, otherwise she will have to transition to regular prenatal visit. I stressed that if she did not start using it witihin 4 days  she would need to re-schedule her next visit for four weeks   Preterm labor symptoms and general obstetric precautions including but not limited to vaginal bleeding, contractions, leaking of fluid and fetal movement were reviewed in detail with the patient. Please refer to After Visit Summary for other counseling recommendations.  Return in about 6 weeks (around 02/22/2017).   Marylene LandKathryn Lorraine Kooistra, CNM

## 2017-01-23 ENCOUNTER — Inpatient Hospital Stay (HOSPITAL_COMMUNITY)
Admission: AD | Admit: 2017-01-23 | Discharge: 2017-01-23 | Disposition: A | Discharge status: Home / Self Care | Payer: Medicaid Other | Source: Ambulatory Visit | Attending: Family Medicine | Admitting: Family Medicine

## 2017-01-23 ENCOUNTER — Encounter (HOSPITAL_COMMUNITY): Payer: Self-pay | Admitting: *Deleted

## 2017-01-23 DIAGNOSIS — Z8249 Family history of ischemic heart disease and other diseases of the circulatory system: Secondary | ICD-10-CM | POA: Insufficient documentation

## 2017-01-23 DIAGNOSIS — O26899 Other specified pregnancy related conditions, unspecified trimester: Secondary | ICD-10-CM

## 2017-01-23 DIAGNOSIS — O162 Unspecified maternal hypertension, second trimester: Secondary | ICD-10-CM

## 2017-01-23 DIAGNOSIS — Z3A23 23 weeks gestation of pregnancy: Secondary | ICD-10-CM | POA: Insufficient documentation

## 2017-01-23 DIAGNOSIS — R109 Unspecified abdominal pain: Secondary | ICD-10-CM | POA: Diagnosis not present

## 2017-01-23 DIAGNOSIS — Z3400 Encounter for supervision of normal first pregnancy, unspecified trimester: Secondary | ICD-10-CM

## 2017-01-23 DIAGNOSIS — Z885 Allergy status to narcotic agent status: Secondary | ICD-10-CM | POA: Insufficient documentation

## 2017-01-23 DIAGNOSIS — Z87891 Personal history of nicotine dependence: Secondary | ICD-10-CM | POA: Diagnosis not present

## 2017-01-23 DIAGNOSIS — O26892 Other specified pregnancy related conditions, second trimester: Secondary | ICD-10-CM | POA: Insufficient documentation

## 2017-01-23 DIAGNOSIS — Z811 Family history of alcohol abuse and dependence: Secondary | ICD-10-CM | POA: Insufficient documentation

## 2017-01-23 LAB — CBC
HCT: 33.5 % — ABNORMAL LOW (ref 36.0–46.0)
Hemoglobin: 10.9 g/dL — ABNORMAL LOW (ref 12.0–15.0)
MCH: 25.9 pg — ABNORMAL LOW (ref 26.0–34.0)
MCHC: 32.5 g/dL (ref 30.0–36.0)
MCV: 79.6 fL (ref 78.0–100.0)
Platelets: 312 10*3/uL (ref 150–400)
RBC: 4.21 MIL/uL (ref 3.87–5.11)
RDW: 14.5 % (ref 11.5–15.5)
WBC: 10.3 10*3/uL (ref 4.0–10.5)

## 2017-01-23 LAB — URINALYSIS, ROUTINE W REFLEX MICROSCOPIC
Bilirubin Urine: NEGATIVE
Glucose, UA: NEGATIVE mg/dL
Hgb urine dipstick: NEGATIVE
Ketones, ur: NEGATIVE mg/dL
Leukocytes, UA: NEGATIVE
Nitrite: NEGATIVE
Protein, ur: NEGATIVE mg/dL
Specific Gravity, Urine: 1.01 (ref 1.005–1.030)
pH: 8 (ref 5.0–8.0)

## 2017-01-23 LAB — COMPREHENSIVE METABOLIC PANEL
ALT: 12 U/L — ABNORMAL LOW (ref 14–54)
AST: 15 U/L (ref 15–41)
Albumin: 3.1 g/dL — ABNORMAL LOW (ref 3.5–5.0)
Alkaline Phosphatase: 74 U/L (ref 38–126)
Anion gap: 7 (ref 5–15)
BUN: 8 mg/dL (ref 6–20)
CO2: 24 mmol/L (ref 22–32)
Calcium: 9 mg/dL (ref 8.9–10.3)
Chloride: 104 mmol/L (ref 101–111)
Creatinine, Ser: 0.38 mg/dL — ABNORMAL LOW (ref 0.44–1.00)
GFR calc Af Amer: >60 mL/min (ref >60)
GFR calc non Af Amer: >60 mL/min (ref >60)
Glucose, Bld: 83 mg/dL (ref 65–99)
Potassium: 4.4 mmol/L (ref 3.5–5.1)
Sodium: 135 mmol/L (ref 135–145)
Total Bilirubin: 0.4 mg/dL (ref 0.3–1.2)
Total Protein: 7.1 g/dL (ref 6.5–8.1)

## 2017-01-23 LAB — PROTEIN / CREATININE RATIO, URINE
Creatinine, Urine: 45 mg/dL
Protein Creatinine Ratio: 0.18 mg/mg{Cre} — ABNORMAL HIGH (ref 0.00–0.15)
Total Protein, Urine: 8 mg/dL

## 2017-01-23 NOTE — MAU Provider Note (Signed)
Chief Complaint:  Abdominal Pain and pelvic pressure   First Provider Initiated Contact with Patient 01/23/17 1055      HPI: Kimberly Farmer is a 21 y.o. [name] at [redacted]w[redacted]d who presents to maternity admissions reporting onset of abdominal cramping yesterday, continuing until now.  The pain is low in the front of her abdomen, is constant at a 7/10, then increases in waves 2-3x/hour to 10/10. She reports waking up through the night crying in pain.  She has tried drinking water and resting but these have not helped.  She has not taken any medications.  There are no associated symptoms.She denies hx of HTN in pregnancy or outside of pregnancy. She reports good fetal movement, denies LOF, vaginal bleeding, vaginal itching/burning, urinary symptoms, h/a, dizziness, n/v, or fever/chills.    HPI  Past Medical History: Past Medical History:  Diagnosis Date  . Arthritis    Bilateral knees and back  . Lumbar disc disease     Past obstetric history: OB History  Gravida Para Term Preterm AB Living  2 0 0 0 1 0  SAB TAB Ectopic Multiple Live Births  1 0 0 0 0    # Outcome Date GA Lbr Len/2nd Weight Sex Delivery Anes PTL Lv  2 Current           1 SAB 10/16/14 [redacted]w[redacted]d    SAB         Past Surgical History: Past Surgical History:  Procedure Laterality Date  . KNEE SURGERY Left 2011   [name] has had 5 surgeries on her knee  . TONSILLECTOMY AND ADENOIDECTOMY  2007    Family History: Family History  Problem Relation Age of Onset  . Hypertension Mother   . Alcohol abuse Father   . Diabetes Maternal Grandmother   . Hypertension Maternal Grandmother   . Cancer Paternal Grandfather     skin    Social History: Social History  Substance Use Topics  . Smoking status: Former Smoker    Types: E-cigarettes  . Smokeless tobacco: Former Neurosurgeon     Comment: Vape - Reduced Nicotine  . Alcohol use No    Allergies:  Allergies  Allergen Reactions  . Hydrocodone Other (See Comments)    Other  Reaction: SEVERE HA & GI Upset  . Oxycodone Other (See Comments)    Other Reaction: SEVERE HA & GI Upset  . Tape Rash    Meds:  Prescriptions Prior to Admission  Medication Sig Dispense Refill Last Dose  . prenatal vitamin w/FE, FA (PRENATAL 1 + 1) 27-1 MG TABS tablet Take 1 tablet by mouth daily at 12 noon.   Past Week at Unknown time  . cyclobenzaprine (FLEXERIL) 10 MG tablet Take 1 tablet (10 mg total) by mouth 3 (three) times daily as needed for muscle spasms (headache). (Patient not taking: Reported on 11/30/2016) 30 tablet 1 Not Taking at Unknown time    ROS:  Review of Systems  Constitutional: Negative for chills, fatigue and fever.  Respiratory: Negative for shortness of breath.   Cardiovascular: Negative for chest pain.  Gastrointestinal: Positive for abdominal pain. Negative for nausea and vomiting.  Genitourinary: Positive for pelvic pain. Negative for difficulty urinating, dysuria, flank pain, vaginal bleeding, vaginal discharge and vaginal pain.  Neurological: Negative for dizziness and headaches.  Psychiatric/Behavioral: Negative.      I have reviewed patient's Past Medical Hx, Surgical Hx, Family Hx, Social Hx, medications and allergies.   Physical Exam   Patient Vitals for the past 24  hrs:  BP Temp Temp src Pulse Resp SpO2 Height Weight  01/23/17 1201 138/85 - - 97 - - - -  01/23/17 1146 130/80 - - 95 - - - -  01/23/17 1131 (!) 141/76 - - 90 - - - -  01/23/17 1116 127/90 - - (!) 128 - - - -  01/23/17 1105 (!) 142/85 - - (!) 101 - - - -  01/23/17 1100 (!) 147/77 - - (!) 103 - - - -  01/23/17 1056 140/84 - - (!) 101 - - - -  01/23/17 1021 (!) 157/83 97.8 F (36.6 C) Oral (!) 108 18 100 %  (1.676 m) 159 lb 0.6 oz (72.1 kg)   Constitutional: Well-developed, well-nourished female in no acute distress.  Cardiovascular: normal rate Respiratory: normal effort GI: Abd soft, non-tender, gravid appropriate for gestational age.  MS: Extremities nontender, no  edema, normal ROM Neurologic: Alert and oriented x 4.  GU: Neg CVAT.   Dilation: Closed Effacement (%): Thick Exam by:: L. Courtney Paris CNM  FHT:  Baseline 155 , moderate variability, accelerations present, no decelerations Contractions: None on toco or to palpation   Labs: Results for orders placed or performed during the hospital encounter of 01/23/17 (from the past 24 hour(s))  Protein / creatinine ratio, urine     Status: Abnormal   Collection Time: 01/23/17 10:23 AM  Result Value Ref Range   Creatinine, Urine 45.00 mg/dL   Total Protein, Urine 8 mg/dL   Protein Creatinine Ratio 0.18 (H) 0.00 - 0.15 mg/mg[Cre]  Urinalysis, Routine w reflex microscopic     Status: None   Collection Time: 01/23/17 10:25 AM  Result Value Ref Range   Color, Urine YELLOW YELLOW   APPearance CLEAR CLEAR   Specific Gravity, Urine 1.010 1.005 - 1.030   pH 8.0 5.0 - 8.0   Glucose, UA NEGATIVE NEGATIVE mg/dL   Hgb urine dipstick NEGATIVE NEGATIVE   Bilirubin Urine NEGATIVE NEGATIVE   Ketones, ur NEGATIVE NEGATIVE mg/dL   Protein, ur NEGATIVE NEGATIVE mg/dL   Nitrite NEGATIVE NEGATIVE   Leukocytes, UA NEGATIVE NEGATIVE  CBC     Status: Abnormal   Collection Time: 01/23/17 11:10 AM  Result Value Ref Range   WBC 10.3 4.0 - 10.5 K/uL   RBC 4.21 3.87 - 5.11 MIL/uL   Hemoglobin 10.9 (L) 12.0 - 15.0 g/dL   HCT 82.9 (L) 56.2 - 13.0 %   MCV 79.6 78.0 - 100.0 fL   MCH 25.9 (L) 26.0 - 34.0 pg   MCHC 32.5 30.0 - 36.0 g/dL   RDW 86.5 78.4 - 69.6 %   Platelets 312 150 - 400 K/uL  Comprehensive metabolic panel     Status: Abnormal   Collection Time: 01/23/17 11:10 AM  Result Value Ref Range   Sodium 135 135 - 145 mmol/L   Potassium 4.4 3.5 - 5.1 mmol/L   Chloride 104 101 - 111 mmol/L   CO2 24 22 - 32 mmol/L   Glucose, Bld 83 65 - 99 mg/dL   BUN 8 6 - 20 mg/dL   Creatinine, Ser 2.95 (L) 0.44 - 1.00 mg/dL   Calcium 9.0 8.9 - 28.4 mg/dL   Total Protein 7.1 6.5 - 8.1 g/dL   Albumin 3.1 (L) 3.5 -  5.0 g/dL   AST 15 15 - 41 U/L   ALT 12 (L) 14 - 54 U/L   Alkaline Phosphatase 74 38 - 126 U/L   Total Bilirubin 0.4 0.3 - 1.2 mg/dL  GFR calc non Af Amer >60 >60 mL/min   GFR calc Af Amer >60 >60 mL/min   Anion gap 7 5 - 15   O/Positive/-- (01/08 1420)  Imaging:  No results found.  MAU Course/MDM: U/A CBC CMP P/C ratio FFN collected but not sent to lab with closed cervix and no contractions on toco I have ordered labs and reviewed results. Preeclampsia labs all wnl. BP borderline, with last 2 wnl. NST reviewed and reactive No evidence of preterm labor, no evidence of infection or other known cause for cramping today. Preterm labor and preeclampsia precautions reviewed. Pt to f/u this week for BP check in office Return to MAU as needed for emergencies Pt stable at time of discharge.  Today's evaluation included a work-up for preterm labor and preeclampsia which can be life-threatening for both mom and baby.  Assessment: 1. Abdominal pain affecting pregnancy   2. Supervision of normal first pregnancy, antepartum   3. Hypertension affecting pregnancy in second trimester     Plan: Discharge home Preterm labor and preeclampsia precautions given  Follow-up Information    [facility] Follow up.   Specialty:  Obstetrics and Gynecology Why:  The office will call you or call to schedule BP check visit this week.  Return to MAU as needed for emergencies. Contact information: [address] [place] [address] [phone]         Allergies as of 01/23/2017      Reactions   Hydrocodone Other (See Comments)   Other Reaction: SEVERE HA & GI Upset   Oxycodone Other (See Comments)   Other Reaction: SEVERE HA & GI Upset   Tape Rash      Medication List    STOP taking these medications   cyclobenzaprine 10 MG tablet Commonly known as:  FLEXERIL     TAKE these medications   prenatal vitamin w/FE, FA 27-1 MG Tabs  tablet Take 1 tablet by mouth daily at 12 noon.       Sharen Counter Certified Nurse-Midwife 01/23/2017 12:55 PM

## 2017-01-23 NOTE — MAU Note (Signed)
Patient c/o lower abdominal cramping that started yesterday with increased vaginal pressure. Denies vaginal bleeding or discharge at this time. States called OB and was advised to come in.

## 2017-01-24 ENCOUNTER — Encounter (HOSPITAL_COMMUNITY): Payer: Self-pay

## 2017-01-24 ENCOUNTER — Ambulatory Visit (HOSPITAL_COMMUNITY)
Admission: RE | Admit: 2017-01-24 | Discharge: 2017-01-24 | Disposition: A | Discharge status: Home / Self Care | Payer: Medicaid Other | Source: Ambulatory Visit | Attending: Physician Assistant | Admitting: Physician Assistant

## 2017-01-24 DIAGNOSIS — O283 Abnormal ultrasonic finding on antenatal screening of mother: Secondary | ICD-10-CM | POA: Diagnosis present

## 2017-01-24 DIAGNOSIS — E669 Obesity, unspecified: Secondary | ICD-10-CM | POA: Diagnosis not present

## 2017-01-24 DIAGNOSIS — Z3A24 24 weeks gestation of pregnancy: Secondary | ICD-10-CM | POA: Diagnosis not present

## 2017-01-24 DIAGNOSIS — O99212 Obesity complicating pregnancy, second trimester: Secondary | ICD-10-CM | POA: Diagnosis not present

## 2017-01-25 ENCOUNTER — Other Ambulatory Visit (HOSPITAL_COMMUNITY): Payer: Self-pay | Admitting: *Deleted

## 2017-01-25 DIAGNOSIS — O358XX0 Maternal care for other (suspected) fetal abnormality and damage, not applicable or unspecified: Secondary | ICD-10-CM

## 2017-01-25 DIAGNOSIS — O35EXX0 Maternal care for other (suspected) fetal abnormality and damage, fetal genitourinary anomalies, not applicable or unspecified: Secondary | ICD-10-CM

## 2017-02-09 ENCOUNTER — Telehealth: Payer: Self-pay | Admitting: *Deleted

## 2017-02-09 NOTE — Telephone Encounter (Signed)
-----   Message from Lindell Spar, [place] sent at 02/09/2017  8:16 AM EDT ----- Regarding: call pt about some issues Contact: [phone] Please call Maniah about some concerns she is having she didn't give me any details

## 2017-02-09 NOTE — Telephone Encounter (Signed)
Tried calling patient - VM not set up and unable to leave message

## 2017-02-23 ENCOUNTER — Ambulatory Visit (INDEPENDENT_AMBULATORY_CARE_PROVIDER_SITE_OTHER): Payer: Medicaid Other | Admitting: Obstetrics and Gynecology

## 2017-02-23 VITALS — BP 119/73 | HR 102 | Wt 266.0 lb

## 2017-02-23 DIAGNOSIS — O9989 Other specified diseases and conditions complicating pregnancy, childbirth and the puerperium: Secondary | ICD-10-CM

## 2017-02-23 DIAGNOSIS — O09899 Supervision of other high risk pregnancies, unspecified trimester: Secondary | ICD-10-CM

## 2017-02-23 DIAGNOSIS — Z3400 Encounter for supervision of normal first pregnancy, unspecified trimester: Secondary | ICD-10-CM

## 2017-02-23 DIAGNOSIS — E669 Obesity, unspecified: Secondary | ICD-10-CM

## 2017-02-23 DIAGNOSIS — Z3482 Encounter for supervision of other normal pregnancy, second trimester: Secondary | ICD-10-CM

## 2017-02-23 DIAGNOSIS — Z283 Underimmunization status: Secondary | ICD-10-CM

## 2017-02-23 DIAGNOSIS — O99211 Obesity complicating pregnancy, first trimester: Secondary | ICD-10-CM

## 2017-02-23 DIAGNOSIS — O99212 Obesity complicating pregnancy, second trimester: Secondary | ICD-10-CM

## 2017-02-23 NOTE — Progress Notes (Signed)
Pt is not fasting today and will need to schedule GTT when she can come in fasting.

## 2017-02-23 NOTE — Progress Notes (Signed)
   PRENATAL VISIT NOTE  Subjective:  Kimberly Farmer is a 21 y.o. [name] at 7638w2d being seen today for ongoing prenatal care.  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  Patient reports no complaints.  Contractions: Not present. Vag. Bleeding: None.  Movement: Present. Denies leaking of fluid.   The following portions of the patient's history were reviewed and updated as appropriate: allergies, current medications, past family history, past medical history, past social history, past surgical history and problem list. Problem list updated.  Objective:   Vitals:   02/23/17 0816  BP: 119/73  Pulse: (!) 102  Weight: 266 lb (120.7 kg)    Fetal Status: Fetal Heart Rate (bpm): 155 Fundal Height: 28 cm Movement: Present     General:  Alert, oriented and cooperative. Patient is in no acute distress.  Skin: Skin is warm and dry. No rash noted.   Cardiovascular: Normal heart rate noted  Respiratory: Normal respiratory effort, no problems with respiration noted  Abdomen: Soft, gravid, appropriate for gestational age. Pain/Pressure: Present     Pelvic:  Cervical exam deferred        Extremities: Normal range of motion.  Edema: None  Mental Status: Normal mood and affect. Normal behavior. Normal judgment and thought content.   Assessment and Plan:  Pregnancy: [name] at 5538w2d  1. Supervision of normal first pregnancy, antepartum Patient is doing well without complaints Patient to return prior to next appointment for 2hr glucola and labs  2. Rubella non-immune status, antepartum Will offer pp  3.  Obesity affecting pregnancy in first trimester Reviewed pregnancy weight gain goal with patient. Advised not to diet but to consume a healthier diet  Preterm labor symptoms and general obstetric precautions including but not limited to vaginal bleeding, contractions, leaking of fluid and fetal movement were reviewed in detail with the patient. Please refer to After Visit Summary for other counseling recommendations.  Return in about 2 weeks (around 03/09/2017) for ROB.   Kyl Givler, Gigi GinPeggy, MD

## 2017-02-26 ENCOUNTER — Other Ambulatory Visit (INDEPENDENT_AMBULATORY_CARE_PROVIDER_SITE_OTHER): Payer: Medicaid Other | Admitting: *Deleted

## 2017-02-26 DIAGNOSIS — Z23 Encounter for immunization: Secondary | ICD-10-CM | POA: Diagnosis not present

## 2017-02-26 DIAGNOSIS — Z3403 Encounter for supervision of normal first pregnancy, third trimester: Secondary | ICD-10-CM

## 2017-02-26 NOTE — Progress Notes (Signed)
Pt is here for 28 wk labs and Tdap injection.

## 2017-02-27 LAB — GLUCOSE TOLERANCE, 2 HOURS W/ 1HR
Glucose, 1 hour: 122 mg/dL (ref 65–179)
Glucose, 2 hour: 120 mg/dL (ref 65–152)
Glucose, Fasting: 74 mg/dL (ref 65–91)

## 2017-02-27 LAB — CBC
Hematocrit: 32.6 % — ABNORMAL LOW (ref 34.0–46.6)
Hemoglobin: 10.3 g/dL — ABNORMAL LOW (ref 11.1–15.9)
MCH: 24.9 pg — ABNORMAL LOW (ref 26.6–33.0)
MCHC: 31.6 g/dL (ref 31.5–35.7)
MCV: 79 fL (ref 79–97)
Platelets: 303 10*3/uL (ref 150–379)
RBC: 4.13 x10E6/uL (ref 3.77–5.28)
RDW: 14.8 % (ref 12.3–15.4)
WBC: 12.6 10*3/uL — ABNORMAL HIGH (ref 3.4–10.8)

## 2017-02-27 LAB — RPR: RPR Ser Ql: NONREACTIVE

## 2017-02-27 LAB — HIV ANTIBODY (ROUTINE TESTING W REFLEX): HIV Screen 4th Generation wRfx: NONREACTIVE

## 2017-03-05 ENCOUNTER — Inpatient Hospital Stay
Admission: EM | Admit: 2017-03-05 | Discharge: 2017-03-05 | Disposition: A | Discharge status: Home / Self Care | Payer: Medicaid Other | Attending: Obstetrics and Gynecology | Admitting: Obstetrics and Gynecology

## 2017-03-05 DIAGNOSIS — Z3A29 29 weeks gestation of pregnancy: Secondary | ICD-10-CM | POA: Diagnosis not present

## 2017-03-05 DIAGNOSIS — N898 Other specified noninflammatory disorders of vagina: Secondary | ICD-10-CM | POA: Diagnosis not present

## 2017-03-05 DIAGNOSIS — O26893 Other specified pregnancy related conditions, third trimester: Secondary | ICD-10-CM | POA: Insufficient documentation

## 2017-03-05 DIAGNOSIS — Z3400 Encounter for supervision of normal first pregnancy, unspecified trimester: Secondary | ICD-10-CM

## 2017-03-05 NOTE — Progress Notes (Signed)
Discharge instructions given and reviewed.  Questions answered.  Verbalized understanding.  

## 2017-03-05 NOTE — OB Triage Note (Signed)
Pt presents c/o mucous discharge with a little bit of blood. States she has had a few ctx but that is normal for her. Denies any LOF. Reports positive fetal movement..Marland Kitchen

## 2017-03-05 NOTE — Final Progress Note (Signed)
Physician Final Progress Note  Patient ID: Kimberly Farmer MRN: [id number] DOB/AGE: 21/06/1996 20 y.o.  Admit date: 03/05/2017 Admitting provider: Vena AustriaAndreas Staebler, MD/ Gasper Lloydolleen L. Sharen HonesGutierrez, CNM Discharge date: 03/05/2017   Admission Diagnoses: IUP at 29wk5d Blood tinged vaginal discharge  Discharge Diagnoses:  Same as above Consults: none  Significant Findings/ Diagnostic Studies: 21 year old G2 P0010 with EDC=05/16/2017 by a 8wk ultrasound presents to L&D at 29wk5d with complaints of blood tinged mucous type discharge x1 when wiping this AM. Had felt a few contractions during the night. No IC last night. No vulvar itching or irritation.  [facility] notable for obesity and bilateral  fetal pyelectasis.   Exam: pleasant WF in NAD Abdomen: obese, soft, NT FHT: baseline 145 with accelerations to 160s to 170, moderate variability Toco: some uterine irritability initially that resolved with po hydration Spec exam: Vulva: no lesions or inflammation Vagina: white mucoepithelial discharge, no blood in vault or on glove. Wet prep: negative for hyphae, Trich, or clue cells Cervix: closed, long, OOP  A: IUP at 29wk5d with c/o blood tinged mucous discharge No current bleeding Not in labor  P: Discharge home Follow up this week as scheduled with prenatal provider.  Procedures: none  Discharge Condition: stable  Disposition: 01-Home or Self Care  Diet: Regular diet  Discharge Activity: Activity as tolerated  Discharge Instructions    Discharge patient    Complete by:  As directed    Discharge disposition:  01-Home or Self Care   Discharge patient date:  03/05/2017     Allergies as of 03/05/2017      Reactions   Hydrocodone Other (See Comments)   Other Reaction: SEVERE HA & GI Upset   Oxycodone Other (See Comments)   Other Reaction: SEVERE HA & GI Upset   Tape Rash      Medication List    TAKE these medications   prenatal vitamin w/FE, FA 27-1 MG Tabs  tablet Take 1 tablet by mouth daily at 12 noon.        Total time spent taking care of this patient: 20 minutes  Signed: [name] 03/05/2017, 10:15 AM

## 2017-03-08 ENCOUNTER — Ambulatory Visit (INDEPENDENT_AMBULATORY_CARE_PROVIDER_SITE_OTHER): Payer: Medicaid Other | Admitting: Obstetrics and Gynecology

## 2017-03-08 VITALS — BP 124/79 | HR 105 | Wt 271.0 lb

## 2017-03-08 DIAGNOSIS — O99211 Obesity complicating pregnancy, first trimester: Secondary | ICD-10-CM

## 2017-03-08 DIAGNOSIS — O09899 Supervision of other high risk pregnancies, unspecified trimester: Secondary | ICD-10-CM

## 2017-03-08 DIAGNOSIS — O99213 Obesity complicating pregnancy, third trimester: Secondary | ICD-10-CM

## 2017-03-08 DIAGNOSIS — Z283 Underimmunization status: Secondary | ICD-10-CM

## 2017-03-08 DIAGNOSIS — Z3400 Encounter for supervision of normal first pregnancy, unspecified trimester: Secondary | ICD-10-CM

## 2017-03-08 DIAGNOSIS — Z3483 Encounter for supervision of other normal pregnancy, third trimester: Secondary | ICD-10-CM

## 2017-03-08 DIAGNOSIS — O9989 Other specified diseases and conditions complicating pregnancy, childbirth and the puerperium: Secondary | ICD-10-CM

## 2017-03-08 DIAGNOSIS — E669 Obesity, unspecified: Secondary | ICD-10-CM

## 2017-03-08 NOTE — Progress Notes (Signed)
   PRENATAL VISIT NOTE  Subjective:  Kimberly Farmer is a 21 y.o. [name] at 7913w1d being seen today for ongoing prenatal care.  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  Patient reports no complaints.  Contractions: Irregular. Vag. Bleeding: None.  Movement: Present. Denies leaking of fluid.   The following portions of the patient's history were reviewed and updated as appropriate: allergies, current medications, past family history, past medical history, past social history, past surgical history and problem list. Problem list updated.  Objective:   Vitals:   03/08/17 0849  BP: 124/79  Pulse: (!) 105  Weight: 271 lb (122.9 kg)    Fetal Status: Fetal Heart Rate (bpm): 160 Fundal Height: 32 cm Movement: Present     General:  Alert, oriented and cooperative. Patient is in no acute distress.  Skin: Skin is warm and dry. No rash noted.   Cardiovascular: Normal heart rate noted  Respiratory: Normal respiratory effort, no problems with respiration noted  Abdomen: Soft, gravid, appropriate for gestational age. Pain/Pressure: Absent     Pelvic:  Cervical exam deferred        Extremities: Normal range of motion.  Edema: Trace  Mental Status: Normal mood and affect. Normal behavior. Normal judgment and thought content.   Assessment and Plan:  Pregnancy: [name] at 5413w1d  1. Supervision of normal first pregnancy, antepartum Patient is doing well [facility] for preterm contractions. Cervix was closed and long Follow up growth ultrasound tomorrow  2. Rubella non-immune status,  antepartum Will offer pp  3. Obesity affecting pregnancy in first trimester   Preterm labor symptoms and general obstetric precautions including but not limited to vaginal bleeding, contractions, leaking of fluid and fetal movement were reviewed in detail with the patient. Please refer to After Visit Summary for other counseling recommendations.  Return in about 2 weeks (around 03/22/2017) for ROB.   Catalina AntiguaPeggy Glen Kesinger, MD

## 2017-03-09 ENCOUNTER — Encounter (HOSPITAL_COMMUNITY): Payer: Self-pay

## 2017-03-09 ENCOUNTER — Other Ambulatory Visit (HOSPITAL_COMMUNITY): Payer: Self-pay | Admitting: Maternal and Fetal Medicine

## 2017-03-09 ENCOUNTER — Ambulatory Visit (HOSPITAL_COMMUNITY)
Admission: RE | Admit: 2017-03-09 | Discharge: 2017-03-09 | Disposition: A | Discharge status: Home / Self Care | Payer: Medicaid Other | Source: Ambulatory Visit | Attending: Physician Assistant | Admitting: Physician Assistant

## 2017-03-09 DIAGNOSIS — Z3A30 30 weeks gestation of pregnancy: Secondary | ICD-10-CM

## 2017-03-09 DIAGNOSIS — O35EXX0 Maternal care for other (suspected) fetal abnormality and damage, fetal genitourinary anomalies, not applicable or unspecified: Secondary | ICD-10-CM

## 2017-03-09 DIAGNOSIS — Z3400 Encounter for supervision of normal first pregnancy, unspecified trimester: Secondary | ICD-10-CM

## 2017-03-09 DIAGNOSIS — O283 Abnormal ultrasonic finding on antenatal screening of mother: Secondary | ICD-10-CM | POA: Insufficient documentation

## 2017-03-09 DIAGNOSIS — O358XX0 Maternal care for other (suspected) fetal abnormality and damage, not applicable or unspecified: Secondary | ICD-10-CM

## 2017-03-21 ENCOUNTER — Encounter: Payer: Self-pay | Admitting: Radiology

## 2017-03-21 ENCOUNTER — Ambulatory Visit (INDEPENDENT_AMBULATORY_CARE_PROVIDER_SITE_OTHER): Payer: Medicaid Other | Admitting: Obstetrics and Gynecology

## 2017-03-21 VITALS — BP 136/82 | HR 109 | Wt 273.0 lb

## 2017-03-21 DIAGNOSIS — O9989 Other specified diseases and conditions complicating pregnancy, childbirth and the puerperium: Secondary | ICD-10-CM

## 2017-03-21 DIAGNOSIS — O99211 Obesity complicating pregnancy, first trimester: Secondary | ICD-10-CM

## 2017-03-21 DIAGNOSIS — Z283 Underimmunization status: Secondary | ICD-10-CM

## 2017-03-21 DIAGNOSIS — O09899 Supervision of other high risk pregnancies, unspecified trimester: Secondary | ICD-10-CM

## 2017-03-21 DIAGNOSIS — O99213 Obesity complicating pregnancy, third trimester: Secondary | ICD-10-CM

## 2017-03-21 DIAGNOSIS — Z3483 Encounter for supervision of other normal pregnancy, third trimester: Secondary | ICD-10-CM

## 2017-03-21 DIAGNOSIS — Z3400 Encounter for supervision of normal first pregnancy, unspecified trimester: Secondary | ICD-10-CM

## 2017-03-21 DIAGNOSIS — E669 Obesity, unspecified: Secondary | ICD-10-CM

## 2017-03-21 NOTE — Progress Notes (Signed)
   PRENATAL VISIT NOTE  Subjective:  Annie MainSamantha K Janak is a 21 y.o. [name] at 35100w0d being seen today for ongoing prenatal care.  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  Patient reports no complaints.  Contractions: Irregular. Vag. Bleeding: None.  Movement: Present. Denies leaking of fluid.   The following portions of the patient's history were reviewed and updated as appropriate: allergies, current medications, past family history, past medical history, past social history, past surgical history and problem list. Problem list updated.  Objective:   Vitals:   03/21/17 0926  BP: 136/82  Pulse: (!) 109  Weight: 273 lb (123.8 kg)    Fetal Status: Fetal Heart Rate (bpm): 156 Fundal Height: 33 cm Movement: Present     General:  Alert, oriented and cooperative. Patient is in no acute distress.  Skin: Skin is warm and dry. No rash noted.   Cardiovascular: Normal heart rate noted  Respiratory: Normal respiratory effort, no problems with respiration noted  Abdomen: Soft, gravid, appropriate for gestational age. Pain/Pressure: Absent     Pelvic:  Cervical exam deferred        Extremities: Normal range of motion.  Edema: Trace  Mental Status: Normal mood and affect. Normal behavior. Normal judgment and thought content.   Assessment and Plan:  Pregnancy: [name] at 44100w0d  1. Supervision of normal first pregnancy, antepartum Patient is doing well without complaints Ultrasound results reviewed with the patient demonstrating minimal bilateral pyelectasis- postnatal follow up needed  2. Rubella non-immune  status, antepartum Will offer pp  3. Obesity affecting pregnancy in first trimester Excessive weight gain discussed  Preterm labor symptoms and general obstetric precautions including but not limited to vaginal bleeding, contractions, leaking of fluid and fetal movement were reviewed in detail with the patient. Please refer to After Visit Summary for other counseling recommendations.  Return in about 2 weeks (around 04/04/2017) for ROB.   Catalina AntiguaPeggy Wynton Hufstetler, MD

## 2017-04-02 ENCOUNTER — Inpatient Hospital Stay (HOSPITAL_COMMUNITY)
Admission: AD | Admit: 2017-04-02 | Discharge: 2017-04-02 | Disposition: A | Discharge status: Home / Self Care | Payer: Medicaid Other | Source: Ambulatory Visit | Attending: Obstetrics and Gynecology | Admitting: Obstetrics and Gynecology

## 2017-04-02 ENCOUNTER — Ambulatory Visit (INDEPENDENT_AMBULATORY_CARE_PROVIDER_SITE_OTHER): Payer: Medicaid Other | Admitting: Obstetrics and Gynecology

## 2017-04-02 VITALS — BP 122/77 | HR 111 | Wt 275.0 lb

## 2017-04-02 DIAGNOSIS — Z885 Allergy status to narcotic agent status: Secondary | ICD-10-CM | POA: Diagnosis not present

## 2017-04-02 DIAGNOSIS — R102 Pelvic and perineal pain: Secondary | ICD-10-CM | POA: Diagnosis present

## 2017-04-02 DIAGNOSIS — O26893 Other specified pregnancy related conditions, third trimester: Secondary | ICD-10-CM

## 2017-04-02 DIAGNOSIS — R109 Unspecified abdominal pain: Secondary | ICD-10-CM | POA: Diagnosis not present

## 2017-04-02 DIAGNOSIS — O9989 Other specified diseases and conditions complicating pregnancy, childbirth and the puerperium: Secondary | ICD-10-CM | POA: Diagnosis not present

## 2017-04-02 DIAGNOSIS — O358XX0 Maternal care for other (suspected) fetal abnormality and damage, not applicable or unspecified: Secondary | ICD-10-CM

## 2017-04-02 DIAGNOSIS — Z3483 Encounter for supervision of other normal pregnancy, third trimester: Secondary | ICD-10-CM

## 2017-04-02 DIAGNOSIS — Z3480 Encounter for supervision of other normal pregnancy, unspecified trimester: Secondary | ICD-10-CM

## 2017-04-02 DIAGNOSIS — O35EXX0 Maternal care for other (suspected) fetal abnormality and damage, fetal genitourinary anomalies, not applicable or unspecified: Secondary | ICD-10-CM

## 2017-04-02 DIAGNOSIS — Z87891 Personal history of nicotine dependence: Secondary | ICD-10-CM | POA: Insufficient documentation

## 2017-04-02 DIAGNOSIS — Z3A33 33 weeks gestation of pregnancy: Secondary | ICD-10-CM | POA: Insufficient documentation

## 2017-04-02 DIAGNOSIS — N949 Unspecified condition associated with female genital organs and menstrual cycle: Secondary | ICD-10-CM

## 2017-04-02 DIAGNOSIS — M8700 Idiopathic aseptic necrosis of unspecified bone: Secondary | ICD-10-CM

## 2017-04-02 DIAGNOSIS — Z6837 Body mass index (BMI) 37.0-37.9, adult: Secondary | ICD-10-CM

## 2017-04-02 DIAGNOSIS — M9320 Osteochondritis dissecans of unspecified site: Secondary | ICD-10-CM

## 2017-04-02 DIAGNOSIS — Z79899 Other long term (current) drug therapy: Secondary | ICD-10-CM | POA: Insufficient documentation

## 2017-04-02 LAB — URINALYSIS, ROUTINE W REFLEX MICROSCOPIC
Bilirubin Urine: NEGATIVE
Glucose, UA: NEGATIVE mg/dL
Hgb urine dipstick: NEGATIVE
Ketones, ur: NEGATIVE mg/dL
Leukocytes, UA: NEGATIVE
Nitrite: NEGATIVE
Protein, ur: NEGATIVE mg/dL
Specific Gravity, Urine: 1.017 (ref 1.005–1.030)
pH: 6 (ref 5.0–8.0)

## 2017-04-02 NOTE — MAU Provider Note (Signed)
History     CSN: [id number]  Arrival date and time: 04/02/17 [id number]   None     Chief Complaint  Patient presents with  . Pelvic Pain   Patient is a 21 year old G2 P0 at 33 weeks and 5 days who presents today with bilateral lower abdomen pain. She reports his been present over the last week or so but is getting worse. She has not taken any medicine and does not use any abdominal support band's. She reports the pain to 3 or 4 out of 10. It is not constant. It is only present with exertion either walking or particularly with getting up off the toilet. She reports its a daily chart note worse on one side over the other. She reports no dysuria or frequency and reports regular fetal movement with no contractions. She really just wanted to make sure it was safe for her to go to work today.    OB History    Gravida Para Term Preterm AB Living   2 0 0 0 1 0   SAB TAB Ectopic Multiple Live Births   1 0 0 0 0      Past Medical History:  Diagnosis Date  . Arthritis    Bilateral knees and back  . Lumbar disc disease     Past Surgical History:  Procedure Laterality Date  . KNEE SURGERY Left 2011   [name] has had 5 surgeries on her knee  . TONSILLECTOMY AND ADENOIDECTOMY  2007    Family History  Problem Relation Age of Onset  . Hypertension Mother   . Alcohol abuse Father   . Diabetes Maternal Grandmother   . Hypertension Maternal Grandmother   . Cancer Paternal Grandfather        skin    Social History  Substance Use Topics  . Smoking status: Former Smoker    Types: E-cigarettes  . Smokeless tobacco: Former NeurosurgeonUser     Comment: Vape - Reduced Nicotine  . Alcohol use No    Allergies:  Allergies  Allergen Reactions  . Hydrocodone Other (See Comments)    Other Reaction: SEVERE HA & GI Upset  . Oxycodone Other (See Comments)    Other Reaction: SEVERE HA & GI Upset  . Tape Rash    Prescriptions Prior to Admission  Medication Sig Dispense Refill Last Dose  . Cetirizine HCl  (ZYRTEC PO) Take by mouth.   Taking  . prenatal vitamin w/FE, FA (PRENATAL 1 + 1) 27-1 MG TABS tablet Take 1 tablet by mouth daily at 12 noon.   Taking    Review of Systems  Constitutional: Negative for chills and fever.  HENT: Negative for congestion and rhinorrhea.   Respiratory: Negative for cough and shortness of breath.   Cardiovascular: Negative for chest pain and palpitations.  Gastrointestinal: Positive for abdominal pain. Negative for abdominal distention, constipation, diarrhea, nausea and vomiting.  Genitourinary: Negative for difficulty urinating, dysuria and frequency.  Neurological: Negative for dizziness and tremors.   Physical Exam   Blood pressure 129/82, pulse (!) 120, temperature 98.2 F (36.8 C), temperature source Oral, resp. rate 20, height 5\' 6"  (1.676 m), weight 277 lb (125.6 kg), last menstrual period 07/26/2016, SpO2 100 %.  Physical Exam  Constitutional: She is oriented to person, place, and time. She appears well-developed and well-nourished.  Obese  HENT:  Head: Normocephalic and atraumatic.  Cardiovascular: Normal rate and intact distal pulses.   Respiratory: Effort normal. No respiratory distress.  GI: Soft. Bowel sounds are normal.  There is no tenderness. There is no rebound and no guarding.  Gravid  Neurological: She is oriented to person, place, and time.  Psychiatric: She has a normal mood and affect. Her behavior is normal.    MAU Course  Procedures  MDM In MA U patient underwent monitoring which revealed no contractions on the fetal heart rate about 150 and was reactive and had moderate variability.   Urinalysis was reviewed and within normal limits.   Assessment and Plan  #1: Round ligament/pelvic pain advised increase hydration and supportive abdominal bracing. Patient voiced understanding. Reactive NST. Okay for DC home.  Ernestina Penna 04/02/2017, 9:17 AM

## 2017-04-02 NOTE — MAU Note (Signed)
Pt reports pelvic pain since yesterday but is worsening today. States the pain is mostly when she is moving around.

## 2017-04-02 NOTE — Discharge Instructions (Signed)

## 2017-04-02 NOTE — Patient Instructions (Signed)
Ask your orthopedic doctors if you can push in dorsal lithotomy.

## 2017-04-02 NOTE — Progress Notes (Signed)
Prenatal Visit Note Date: 04/02/2017 Clinic: [facility]  Subjective:  Kimberly Farmer is a 21 y.o. [name] at 6324w5d being seen today for ongoing prenatal care.  She is currently monitored for the following issues for this high-risk pregnancy and has Airway hyperreactivity; F/H of alcoholism; Broken leg; Idiopathic avascular necrosis; Bone disease; Patellar instability; Chronic midline low back pain without sciatica; Supervision of normal pregnancy, antepartum; Obesity affecting pregnancy in first trimester; BMI 37.0-37.9, adult; Rubella non-immune status, antepartum; and Encounter for repeat ultrasound of fetal pyelectasis in singleton pregnancy, antepartum on her problem list.  Patient reports no complaints.   Contractions: Not present. Vag. Bleeding: None.  Movement: Present. Denies leaking of fluid.   The following portions of the patient's history were reviewed and updated as appropriate: allergies, current medications, past family history, past medical history, past social history, past surgical history and problem list. Problem list updated.  Objective:   Vitals:   04/02/17 1553  BP: 122/77  Pulse: (!) 111  Weight: 275 lb (124.7 kg)    Fetal Status: Fetal Heart Rate (bpm): 151 Fundal Height: 33 cm Movement: Present  Presentation: Vertex  General:  Alert, oriented and cooperative. Patient is in no acute distress.  Skin: Skin is warm and dry. No rash noted.   Cardiovascular: Normal heart rate noted  Respiratory: Normal respiratory effort, no problems with respiration noted  Abdomen: Soft, gravid, appropriate for gestational age. Pain/Pressure: Present     Pelvic:  Cervical exam deferred        Extremities: Normal range of motion.  Edema: Trace  Mental Status: Normal mood and affect. Normal behavior. Normal judgment and thought content.   Urinalysis:      Assessment and Plan:  Pregnancy: [name] at 1824w5d  1. Supervision of other normal pregnancy,  antepartum Seen in MAU earlier today and dx with RL pain. No issues currently. Pt thinks it was fetal positioning. GBS and d/w pt re: BC nv.  2. BMI 37.0-37.9, adult See above  3. Encounter for repeat ultrasound of fetal pyelectasis in singleton pregnancy, antepartum NTD. MFM recs to tell peds at delivery  4. History of Idiopathic avascular necrosis D/w that recommend she call primary ortho to see if okay to push in dorsal lithotomy. D/w her that may want to push on side given her history.   Preterm labor symptoms and general obstetric precautions including but not limited to vaginal bleeding, contractions, leaking of fluid and fetal movement were reviewed in detail with the patient. Please refer to After Visit Summary for other counseling recommendations.  Return in about 2 weeks (around 04/16/2017) for rob.   Maupin BingPickens, Kimberly Siers, MD

## 2017-04-09 ENCOUNTER — Telehealth: Payer: Self-pay | Admitting: *Deleted

## 2017-04-09 NOTE — Telephone Encounter (Signed)
Pt called in stating her dog was recently treated for round worms and wanted to know if it could be transmitted by the dog licking her and cleaning up after urination/BM. Called MD on call to confirm no lab tests are needed. Advised pt to stay away from dog as much as possible until cured. If any symptoms arise she will need eval. Pt expressed understanding.

## 2017-04-16 ENCOUNTER — Telehealth: Payer: Self-pay | Admitting: Obstetrics & Gynecology

## 2017-04-16 DIAGNOSIS — M9320 Osteochondritis dissecans of unspecified site: Secondary | ICD-10-CM

## 2017-04-16 DIAGNOSIS — M8700 Idiopathic aseptic necrosis of unspecified bone: Secondary | ICD-10-CM

## 2017-04-16 NOTE — Telephone Encounter (Signed)
Faculty Practice OB/GYN Attending Phone Call Documentation  I placed call to Kimberly Farmer, who had called earlier requesting that I call her back. Patient has idiopathic vascular necrosis with patellar instability and is under the care of an orthopedist.  Over the last couple of weeks, she reports her "knees give way" and she slips and falls due to her enlarging gravid abdomen. No abdominal injury. This has happened three times in the last couple of weeks; all falls are minor so far.  She reports other family members have this condition and had to be induced around 38 weeks given concern about worsening traumatic injury in pregnancy.  She wanted to know if she can be induced earlier.  Patient was informed that consideration about such inductions will occur around [redacted] weeks gestation, assuming there are no other complications prior to this time.  She was told to follow up with orthopedist to be evaluate for any gait stability aids in the meantime; also recommended cutting down/discontinuing work for now.  She is considering stopping work soon.  Fall precautions reviewed; told to call EMS or come to the ER for any serious falls or any falls involving the abdomen, vaginal bleeding, LOF, contractions or decreased FM.  She will continue regular prenatal care for now; we will consider IOL at 39 weeks if symptoms continue.   [name]  [name], MD, FACOG Attending Obstetrician & Gynecologist, [facility], [facility]

## 2017-04-19 ENCOUNTER — Inpatient Hospital Stay (HOSPITAL_COMMUNITY): Payer: Medicaid Other

## 2017-04-19 ENCOUNTER — Observation Stay (HOSPITAL_COMMUNITY)
Admission: AD | Admit: 2017-04-19 | Discharge: 2017-04-20 | Disposition: A | Discharge status: Home / Self Care | Payer: Medicaid Other | Source: Ambulatory Visit | Attending: Obstetrics and Gynecology | Admitting: Obstetrics and Gynecology

## 2017-04-19 ENCOUNTER — Encounter: Payer: Medicaid Other | Admitting: Obstetrics & Gynecology

## 2017-04-19 ENCOUNTER — Encounter (HOSPITAL_COMMUNITY): Payer: Self-pay | Admitting: *Deleted

## 2017-04-19 DIAGNOSIS — O9989 Other specified diseases and conditions complicating pregnancy, childbirth and the puerperium: Secondary | ICD-10-CM | POA: Diagnosis not present

## 2017-04-19 DIAGNOSIS — Y998 Other external cause status: Secondary | ICD-10-CM | POA: Diagnosis not present

## 2017-04-19 DIAGNOSIS — W010XXA Fall on same level from slipping, tripping and stumbling without subsequent striking against object, initial encounter: Secondary | ICD-10-CM | POA: Insufficient documentation

## 2017-04-19 DIAGNOSIS — O368131 Decreased fetal movements, third trimester, fetus 1: Secondary | ICD-10-CM | POA: Insufficient documentation

## 2017-04-19 DIAGNOSIS — S40811A Abrasion of right upper arm, initial encounter: Secondary | ICD-10-CM | POA: Insufficient documentation

## 2017-04-19 DIAGNOSIS — R296 Repeated falls: Secondary | ICD-10-CM | POA: Diagnosis present

## 2017-04-19 DIAGNOSIS — Z3A36 36 weeks gestation of pregnancy: Secondary | ICD-10-CM | POA: Diagnosis not present

## 2017-04-19 DIAGNOSIS — Z87891 Personal history of nicotine dependence: Secondary | ICD-10-CM | POA: Insufficient documentation

## 2017-04-19 DIAGNOSIS — Z3480 Encounter for supervision of other normal pregnancy, unspecified trimester: Secondary | ICD-10-CM

## 2017-04-19 DIAGNOSIS — T1490XA Injury, unspecified, initial encounter: Secondary | ICD-10-CM | POA: Diagnosis not present

## 2017-04-19 DIAGNOSIS — O4703 False labor before 37 completed weeks of gestation, third trimester: Secondary | ICD-10-CM | POA: Diagnosis present

## 2017-04-19 DIAGNOSIS — O9A213 Injury, poisoning and certain other consequences of external causes complicating pregnancy, third trimester: Secondary | ICD-10-CM | POA: Insufficient documentation

## 2017-04-19 DIAGNOSIS — Y929 Unspecified place or not applicable: Secondary | ICD-10-CM | POA: Diagnosis not present

## 2017-04-19 DIAGNOSIS — Y939 Activity, unspecified: Secondary | ICD-10-CM | POA: Insufficient documentation

## 2017-04-19 DIAGNOSIS — Y92009 Unspecified place in unspecified non-institutional (private) residence as the place of occurrence of the external cause: Secondary | ICD-10-CM

## 2017-04-19 DIAGNOSIS — W19XXXA Unspecified fall, initial encounter: Secondary | ICD-10-CM

## 2017-04-19 LAB — TYPE AND SCREEN
ABO/RH(D): O POS
Antibody Screen: NEGATIVE

## 2017-04-19 MED ORDER — DOCUSATE SODIUM 100 MG PO CAPS: 100.0000 mg | ORAL_CAPSULE | Freq: Every day | ORAL | Status: DC

## 2017-04-19 MED ORDER — SODIUM CHLORIDE 0.9% FLUSH: 3.0000 mL | INTRAVENOUS | Status: DC | PRN

## 2017-04-19 MED ORDER — PRENATAL MULTIVITAMIN CH: 1.0000 | ORAL_TABLET | Freq: Every day | ORAL | Status: DC

## 2017-04-19 MED ORDER — ZOLPIDEM TARTRATE 5 MG PO TABS: 5.0000 mg | ORAL_TABLET | Freq: Every evening | ORAL | Status: DC | PRN

## 2017-04-19 MED ORDER — SODIUM CHLORIDE 0.9 % IV SOLN: 250.0000 mL | INTRAVENOUS | Status: DC | PRN

## 2017-04-19 MED ORDER — SODIUM CHLORIDE 0.9% FLUSH: 3.0000 mL | Freq: Two times a day (BID) | INTRAVENOUS | Status: DC

## 2017-04-19 MED ORDER — CALCIUM CARBONATE ANTACID 500 MG PO CHEW: 2.0000 | CHEWABLE_TABLET | ORAL | Status: DC | PRN

## 2017-04-19 MED ORDER — ACETAMINOPHEN 325 MG PO TABS: 650.0000 mg | ORAL_TABLET | ORAL | Status: DC | PRN

## 2017-04-19 NOTE — MAU Provider Note (Signed)
History     CSN: [id number]  Arrival date and time: 04/19/17 [id number]  First Provider Initiated Contact with Patient 04/19/17 1929      Chief Complaint  Patient presents with  . Fall   HPI Kimberly Farmer is a 21 y.o. [name] at 6086w1d who presents s/p fall. 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 Reports 7 falls in the last 2 weeks. Last fall occurred around 6 pm. States her knees gave out & she landed on her right arm, hip & right side of her abdomen. Reports low back pain since fall. Describes as dull ache that she rates 0/10. Has not taken medication. No fetal movement since fall but states she normally has "low movement". Denies abdominal pain, vaginal bleeding, or LOF.   OB History    Gravida Para Term Preterm AB Living   2 0 0 0 1 0   SAB TAB Ectopic Multiple Live Births   1 0 0 0 0      Past Medical History:  Diagnosis Date  . Arthritis    Bilateral knees and back  . Lumbar disc disease     Past Surgical History:  Procedure Laterality Date  . KNEE SURGERY Left 2011   [name] has had 5 surgeries on her knee  . TONSILLECTOMY AND ADENOIDECTOMY  2007    Family History  Problem Relation Age of Onset  . Hypertension Mother   . Alcohol abuse Father   . Diabetes Maternal Grandmother   . Hypertension Maternal Grandmother   . Cancer Paternal Grandfather        skin    Social History  Substance Use Topics  . Smoking status: Former Smoker    Types: E-cigarettes  . Smokeless tobacco: Former NeurosurgeonUser     Comment: Vape - Reduced Nicotine  . Alcohol use No    Allergies:  Allergies  Allergen Reactions  . Hydrocodone Other (See Comments)    Other Reaction: SEVERE HA & GI Upset  . Oxycodone Other (See Comments)    Other Reaction: SEVERE HA & GI Upset  . Tape Rash    Prescriptions Prior to Admission  Medication Sig Dispense Refill Last Dose  . Cetirizine HCl (ZYRTEC PO) Take by mouth.   Taking  . prenatal vitamin w/FE, FA (PRENATAL 1 + 1) 27-1 MG TABS  tablet Take 1 tablet by mouth daily at 12 noon.   Taking    Review of Systems  Gastrointestinal: Negative.   Genitourinary: Negative.   Musculoskeletal: Positive for back pain.       + fall  Neurological: Negative for dizziness and syncope.   Physical Exam   Blood pressure 132/81, pulse (!) 111, temperature 98.1 F (36.7 C), temperature source Oral, resp. rate 18, last menstrual period 07/26/2016.  Physical Exam  Nursing note and vitals reviewed. Constitutional: She is oriented to person, place, and time. She appears well-developed and well-nourished. No distress.  HENT:  Head: Normocephalic and atraumatic.  Eyes: Conjunctivae are normal. Right eye exhibits no discharge. Left eye exhibits no discharge. No scleral icterus.  Neck: Normal range of motion.  Respiratory: Effort normal. No respiratory distress.  GI: Soft. There is no tenderness.  Abdomen soft & non tender; no bruising  Neurological: She is alert and oriented to person, place, and time.  Skin: Skin is warm and dry. She is not diaphoretic.  Minor abrasions noted on posterior upper arm, no bleeding or bruising  Psychiatric: She has a normal mood and affect.  Her behavior is normal. Judgment and thought content normal.   Fetal Tracing:  Baseline: 140 bpm Variability:moderate Accelerations: 15 x 15 Decelerations: None  Toco: q 2-3 minutes   MAU Course  Procedures No results found for this or any previous visit (from the past 24 hour(s)).  MDM Plan on 4 hours of monitoring Limited ultrasound ordered to assess placenta Patient declines medication for back pain Care turned over to Vonzella Nipple [facility]    2000 - Care assumed from Judeth Horn, NP Preliminary US WNL Discussed patient with Dr. Jolayne Panther. Admit for observation due to regular contractions after a fall.  Assessment and Plan   A: [facility] at [redacted]w[redacted]d Fall at home, initial encounter Preterm contractions, third trimester  P:  Admit to [facility] for continuous  monitoring and observation   [name] 04/19/2017 10:02 PM

## 2017-04-19 NOTE — MAU Note (Signed)
Pt reports she has a bone disorder that makes her knee give out. She has fallen about 7 times in the past 2 weeks. Larey SeatFell today and landed on her stomach. Denies any pain right now but does report she had not felt baby move since the fall. Also reports she often has decreased fetal movemement so it is not unusual for her not to feel the baby move. Pt stated she thought sh might have a a little leakage earlier today prior to her falling.

## 2017-04-20 LAB — ABO/RH: ABO/RH(D): O POS

## 2017-04-20 NOTE — Discharge Instructions (Signed)
Bathing    Place all items within easy reach to avoid bending and twisting. Use long-handled brush to soap and hand-held shower to rinse off. Step on non-skid floor mat to dry feet off. Make sure that tub or shower has non-skid bottom, or use rubber mat to avoid slipping. Safety bars are also recommended.  Copyright  VHI. All rights reserved.

## 2017-04-20 NOTE — Discharge Summary (Signed)
Physician Discharge Summary  Patient ID: Kimberly Farmer MRN: [id number] DOB/AGE: 21/06/1996 20 y.o.  Admit date: 04/19/2017 Discharge date: 04/20/2017  Admission Diagnoses:observation s/p fall  Pregnancy 36+ wk  DAnnie Mainischarge Diagnoses:  Active Problems:   Preterm uterine contractions in third trimester, antepartum Preterm uterine contractions resolved  Discharged Condition: good  Hospital Course: HPI upon admission: Kimberly HarrisonSamantha K Perryis a 20 y.o.[name] at 4931w1d who presents s/p fall. 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 Reports 7 falls in the last 2 weeks. Last fall occurred around 6 pm. States her knees gave out &she landed on her right arm, hip &right side of her abdomen. Reports low back pain since fall. Describes as dull ache that she rates 0/10. Has not taken medication. No fetal movement since fall but state Blood pressure 132/81, pulse (!) 111, temperature 98.1 F (36.7 C), temperature source Oral, resp. rate 18, s she normally has "low movement". Denies abdominal pain, vaginal bleeding, or LOF . Fetal Tracing:  Baseline: 140 bpm Variability:moderate Accelerations: 15 x 15 Decelerations: None  Toco: q 2-3 minutes Patient was kept on continuous fetal monitoring overnight with spontaneous resolution of contractions. Patient overnight borderline elevation of pressure is 130 to 140/85, with nontender abdomen category 1 fetal heart rate tracing upon morning assessment and was discharged home at 12 hours approximately.  Consults: None  Significant Diagnostic Studies: NST, continuous monitoring  Treatments: EFMonitoring  Discharge Exam: Blood pressure 139/85, pulse (!) 122, temperature 98 F (36.7 C), temperature source Oral, resp. rate 20, height 5\' 6"  (1.676 m), weight 278 lb (126.1 kg), last menstrual period 07/26/2016, SpO2 98 %. General appearance: alert, cooperative and no distress Head: Normocephalic, without obvious abnormality,  atraumatic GI: soft, non-tender; bowel sounds normal; no masses,  no organomegaly and nontender uterus. monitor Cat I Extremities: extremities normal, atraumatic, no cyanosis or edema  Disposition: 01-Home or Self Care  Discharge Instructions    Call MD for:  severe uncontrolled pain    Complete by:  As directed    Diet - low sodium heart healthy    Complete by:  As directed    Discharge instructions    Complete by:  As directed    Please speak to work about reduced activity due to tendency to fall. Also speak with husband re: being there when you take showers.   Increase activity slowly    Complete by:  As directed      Allergies as of 04/20/2017      Reactions   Hydrocodone Other (See Comments)   Other Reaction: SEVERE HA & GI Upset   Oxycodone Other (See Comments)   Other Reaction: SEVERE HA & GI Upset   Tape Rash      Medication List    TAKE these medications   prenatal vitamin w/FE, FA 27-1 MG Tabs tablet Take 1 tablet by mouth daily at 12 noon.   ZYRTEC PO Take by mouth.      Follow-up Information    [facility].   Specialty:  Obstetrics and Gynecology Contact information: [address] [place] [id number] [phone]          Signed: Tilda BurrowFERGUSON,Frances Joynt V 04/20/2017, 8:15 AM

## 2017-04-20 NOTE — H&P (Signed)
HPI Kimberly Farmer is a [age] y.o. [name] at 2131w1d who presents s/p fall. 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 Reports 7 falls in the last 2 weeks. Last fall occurred around 6 pm. States her knees gave out & she landed on her right arm, hip & right side of her abdomen. Reports low back pain since fall. Describes as dull ache that she rates 0/10. Has not taken medication. No fetal movement since fall but states she normally has "low movement". Denies abdominal pain, vaginal bleeding, or LOF.           OB History    Gravida Para Term Preterm AB Living   2 0 0 0 1 0   SAB TAB Ectopic Multiple Live Births   1 0 0 0 0          Past Medical History:  Diagnosis Date  . Arthritis    Bilateral knees and back  . Lumbar disc disease          Past Surgical History:  Procedure Laterality Date  . KNEE SURGERY Left 2011   [name] has had 5 surgeries on her knee  . TONSILLECTOMY AND ADENOIDECTOMY  2007         Family History  Problem Relation Age of Onset  . Hypertension Mother   . Alcohol abuse Father   . Diabetes Maternal Grandmother   . Hypertension Maternal Grandmother   . Cancer Paternal Grandfather        skin          Social History  Substance Use Topics  . Smoking status: Former Smoker    Types: E-cigarettes  . Smokeless tobacco: Former NeurosurgeonUser     Comment: Vape - Reduced Nicotine  . Alcohol use No    Allergies:       Allergies  Allergen Reactions  . Hydrocodone Other (See Comments)    Other Reaction: SEVERE HA & GI Upset  . Oxycodone Other (See Comments)    Other Reaction: SEVERE HA & GI Upset  . Tape Rash           Prescriptions Prior to Admission  Medication Sig Dispense Refill Last Dose  . Cetirizine HCl (ZYRTEC PO) Take by mouth.   Taking  . prenatal vitamin w/FE, FA (PRENATAL 1 + 1) 27-1 MG TABS tablet Take 1 tablet by mouth daily at 12 noon.   Taking    Review of Systems  Gastrointestinal:  Negative.   Genitourinary: Negative.   Musculoskeletal: Positive for back pain.       + fall  Neurological: Negative for dizziness and syncope.   Physical Exam   Blood pressure 132/81, pulse (!) 111, temperature 98.1 F (36.7 C), temperature source Oral, resp. rate 18, last menstrual period 07/26/2016.  Physical Exam  Nursing note and vitals reviewed. Constitutional: She is oriented to person, place, and time. She appears well-developed and well-nourished. No distress.  HENT:  Head: Normocephalic and atraumatic.  Eyes: Conjunctivae are normal. Right eye exhibits no discharge. Left eye exhibits no discharge. No scleral icterus.  Neck: Normal range of motion.  Respiratory: Effort normal. No respiratory distress.  GI: Soft. There is no tenderness.  Abdomen soft & non tender; no bruising  Neurological: She is alert and oriented to person, place, and time.  Skin: Skin is warm and dry. She is not diaphoretic.  Minor abrasions noted on posterior upper arm, no bleeding or bruising  Psychiatric: She has a normal mood and  affect. Her behavior is normal. Judgment and thought content normal.   Fetal Tracing:  Baseline: 140 bpm Variability:moderate Accelerations: 15 x 15 Decelerations: None  Toco: q 2-3 minutes    Assessment and Plan   21 yo [name] at [redacted]w[redacted]d s/p fall with frequent contractions - Admit to L&D for 24 hour observation - Ultrasound negative for abruption - Close monitoring - Routine antepartum care

## 2017-04-20 NOTE — Progress Notes (Signed)
Discharge and fall prevention teaching done questions answered, Pt left ambulating with significant other.

## 2017-04-23 ENCOUNTER — Inpatient Hospital Stay (HOSPITAL_COMMUNITY)
Admission: AD | Admit: 2017-04-23 | Discharge: 2017-04-23 | Disposition: A | Discharge status: Home / Self Care | Payer: Medicaid Other | Source: Ambulatory Visit | Attending: Family Medicine | Admitting: Family Medicine

## 2017-04-23 ENCOUNTER — Encounter (HOSPITAL_COMMUNITY): Payer: Self-pay | Admitting: *Deleted

## 2017-04-23 DIAGNOSIS — Z808 Family history of malignant neoplasm of other organs or systems: Secondary | ICD-10-CM | POA: Diagnosis not present

## 2017-04-23 DIAGNOSIS — O9989 Other specified diseases and conditions complicating pregnancy, childbirth and the puerperium: Secondary | ICD-10-CM | POA: Insufficient documentation

## 2017-04-23 DIAGNOSIS — M170 Bilateral primary osteoarthritis of knee: Secondary | ICD-10-CM | POA: Diagnosis not present

## 2017-04-23 DIAGNOSIS — Z9889 Other specified postprocedural states: Secondary | ICD-10-CM | POA: Insufficient documentation

## 2017-04-23 DIAGNOSIS — Z833 Family history of diabetes mellitus: Secondary | ICD-10-CM | POA: Insufficient documentation

## 2017-04-23 DIAGNOSIS — Z885 Allergy status to narcotic agent status: Secondary | ICD-10-CM | POA: Insufficient documentation

## 2017-04-23 DIAGNOSIS — Z3A36 36 weeks gestation of pregnancy: Secondary | ICD-10-CM | POA: Diagnosis not present

## 2017-04-23 DIAGNOSIS — Z8249 Family history of ischemic heart disease and other diseases of the circulatory system: Secondary | ICD-10-CM | POA: Insufficient documentation

## 2017-04-23 DIAGNOSIS — O133 Gestational [pregnancy-induced] hypertension without significant proteinuria, third trimester: Secondary | ICD-10-CM | POA: Diagnosis not present

## 2017-04-23 DIAGNOSIS — Z811 Family history of alcohol abuse and dependence: Secondary | ICD-10-CM | POA: Diagnosis not present

## 2017-04-23 DIAGNOSIS — Z3689 Encounter for other specified antenatal screening: Secondary | ICD-10-CM

## 2017-04-23 DIAGNOSIS — O139 Gestational [pregnancy-induced] hypertension without significant proteinuria, unspecified trimester: Secondary | ICD-10-CM | POA: Diagnosis present

## 2017-04-23 DIAGNOSIS — R296 Repeated falls: Secondary | ICD-10-CM | POA: Insufficient documentation

## 2017-04-23 DIAGNOSIS — O368120 Decreased fetal movements, second trimester, not applicable or unspecified: Secondary | ICD-10-CM

## 2017-04-23 DIAGNOSIS — Z87891 Personal history of nicotine dependence: Secondary | ICD-10-CM | POA: Diagnosis not present

## 2017-04-23 DIAGNOSIS — Z9109 Other allergy status, other than to drugs and biological substances: Secondary | ICD-10-CM | POA: Insufficient documentation

## 2017-04-23 DIAGNOSIS — O368130 Decreased fetal movements, third trimester, not applicable or unspecified: Secondary | ICD-10-CM | POA: Diagnosis present

## 2017-04-23 LAB — CBC
HCT: 31.6 % — ABNORMAL LOW (ref 36.0–46.0)
Hemoglobin: 9.9 g/dL — ABNORMAL LOW (ref 12.0–15.0)
MCH: 22.6 pg — ABNORMAL LOW (ref 26.0–34.0)
MCHC: 31.3 g/dL (ref 30.0–36.0)
MCV: 72.1 fL — ABNORMAL LOW (ref 78.0–100.0)
Platelets: 398 10*3/uL (ref 150–400)
RBC: 4.38 MIL/uL (ref 3.87–5.11)
RDW: 15.4 % (ref 11.5–15.5)
WBC: 13.5 10*3/uL — ABNORMAL HIGH (ref 4.0–10.5)

## 2017-04-23 LAB — COMPREHENSIVE METABOLIC PANEL
ALT: 13 U/L — ABNORMAL LOW (ref 14–54)
AST: 16 U/L (ref 15–41)
Albumin: 3 g/dL — ABNORMAL LOW (ref 3.5–5.0)
Alkaline Phosphatase: 143 U/L — ABNORMAL HIGH (ref 38–126)
Anion gap: 6 (ref 5–15)
BUN: 7 mg/dL (ref 6–20)
CO2: 24 mmol/L (ref 22–32)
Calcium: 9.1 mg/dL (ref 8.9–10.3)
Chloride: 104 mmol/L (ref 101–111)
Creatinine, Ser: 0.54 mg/dL (ref 0.44–1.00)
GFR calc Af Amer: >60 mL/min (ref >60)
GFR calc non Af Amer: >60 mL/min (ref >60)
Glucose, Bld: 84 mg/dL (ref 65–99)
Potassium: 4.4 mmol/L (ref 3.5–5.1)
Sodium: 134 mmol/L — ABNORMAL LOW (ref 135–145)
Total Bilirubin: 0.4 mg/dL (ref 0.3–1.2)
Total Protein: 7.2 g/dL (ref 6.5–8.1)

## 2017-04-23 LAB — PROTEIN / CREATININE RATIO, URINE
Creatinine, Urine: 50 mg/dL
Protein Creatinine Ratio: 0.12 mg/mg{Cre} (ref 0.00–0.15)
Total Protein, Urine: 6 mg/dL

## 2017-04-23 NOTE — MAU Note (Signed)
Urine in lab 

## 2017-04-23 NOTE — MAU Note (Signed)
C/o falling 5 days ago and had to stay in the hospital over night; c/o decreased fetal movement since leaving the hospital;

## 2017-04-23 NOTE — MAU Provider Note (Signed)
History     CSN: [id number]  Arrival date and time: 04/23/17 1348   First Provider Initiated Contact with Patient 04/23/17 1439      Chief Complaint  Patient presents with  . Fall  . Decreased Fetal Movement   HPI   Kimberly Farmer is a 21 y.o. 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  She had a fall last week and was admitted for observation due to frequent contractions. Since she has gone home she has had decreased fetal movement.   Patient states she has fallen 7 times in the last 2 weeks. Wants to be induced due to the falls; her fiance works 2nd shift and is worried about being home alone. She states both knees give out due to the instability and this is the cause of her frequent falls. Since her arrival today she has felt her baby move 5 times in the last hour.   OB History    Gravida Para Term Preterm AB Living   2 0 0 0 1 0   SAB TAB Ectopic Multiple Live Births   1 0 0 0 0      Past Medical History:  Diagnosis Date  . Arthritis    Bilateral knees and back  . Lumbar disc disease     Past Surgical History:  Procedure Laterality Date  . KNEE SURGERY Left 2011   PFraser Dinhas had 5 surgeries on her knee  . TONSILLECTOMY AND ADENOIDECTOMY  2007    Family History  Problem Relation Age of Onset  . Hypertension Mother   . Alcohol abuse Father   . Diabetes Maternal Grandmother   . Hypertension Maternal Grandmother   . Cancer Paternal Grandfather        skin    Social History  Substance Use Topics  . Smoking status: Former Smoker    Types: E-cigarettes  . Smokeless tobacco: Former USystems developer    Comment: Vape - Reduced Nicotine  . Alcohol use No    Allergies:  Allergies  Allergen Reactions  . Hydrocodone Other (See Comments)    Other Reaction: SEVERE HA & GI Upset  . Oxycodone Other (See Comments)    Other Reaction: SEVERE HA & GI Upset  .  Tape Rash    Prescriptions Prior to Admission  Medication Sig Dispense Refill Last Dose  . Prenatal Vit-Fe Fumarate-FA (PRENATAL MULTIVITAMIN) TABS tablet Take 1 tablet by mouth daily at 12 noon.   04/23/2017 at Unknown time   Results for orders placed or performed during the hospital encounter of 04/23/17 (from the past 48 hour(s))  Protein / creatinine ratio, urine     Status: None   Collection Time: 04/23/17  2:55 PM  Result Value Ref Range   Creatinine, Urine 50.00 mg/dL   Total Protein, Urine 6 mg/dL    Comment: NO NORMAL RANGE ESTABLISHED FOR THIS TEST   Protein Creatinine Ratio 0.12 0.00 - 0.15 mg/mg[Cre]  CBC     Status: Abnormal   Collection Time: 04/23/17  3:17 PM  Result Value Ref Range   WBC 13.5 (H) 4.0 - 10.5 K/uL   RBC 4.38 3.87 - 5.11 MIL/uL   Hemoglobin 9.9 (L) 12.0 - 15.0 g/dL   HCT 31.6 (L) 36.0 - 46.0 %   MCV 72.1 (L) 78.0 - 100.0 fL   MCH 22.6 (L) 26.0 - 34.0 pg   MCHC 31.3  30.0 - 36.0 g/dL   RDW 15.4 11.5 - 15.5 %   Platelets 398 150 - 400 K/uL  Comprehensive metabolic panel     Status: Abnormal   Collection Time: 04/23/17  3:17 PM  Result Value Ref Range   Sodium 134 (L) 135 - 145 mmol/L   Potassium 4.4 3.5 - 5.1 mmol/L   Chloride 104 101 - 111 mmol/L   CO2 24 22 - 32 mmol/L   Glucose, Bld 84 65 - 99 mg/dL   BUN 7 6 - 20 mg/dL   Creatinine, Ser 0.54 0.44 - 1.00 mg/dL   Calcium 9.1 8.9 - 10.3 mg/dL   Total Protein 7.2 6.5 - 8.1 g/dL   Albumin 3.0 (L) 3.5 - 5.0 g/dL   AST 16 15 - 41 U/L   ALT 13 (L) 14 - 54 U/L   Alkaline Phosphatase 143 (H) 38 - 126 U/L   Total Bilirubin 0.4 0.3 - 1.2 mg/dL   GFR calc non Af Amer >60 >60 mL/min   GFR calc Af Amer >60 >60 mL/min    Comment: (NOTE) The eGFR has been calculated using the CKD EPI equation. This calculation has not been validated in all clinical situations. eGFR's persistently <60 mL/min signify possible Chronic Kidney Disease.    Anion gap 6 5 - 15   Review of Systems  Constitutional: Negative  for fever.  Eyes: Negative for photophobia and visual disturbance.  Gastrointestinal: Negative for abdominal pain.  Genitourinary: Negative for dysuria.  Neurological: Negative for headaches.   Physical Exam   Blood pressure (!) 137/91, pulse (!) 107, temperature 97.8 F (36.6 C), temperature source Oral, resp. rate 18, last menstrual period 07/26/2016.  Patient Vitals for the past 24 hrs:  BP Temp Temp src Pulse Resp  04/23/17 1530 (!) 137/91 - - (!) 107 -  04/23/17 1515 (!) 149/91 - - (!) 115 -  04/23/17 1502 (!) 152/98 - - (!) 124 -  04/23/17 1453 (!) 156/92 - - (!) 122 -  04/23/17 1410 138/83 - - (!) 129 -  04/23/17 1358 (!) 141/81 97.8 F (36.6 C) Oral (!) 130 18    Physical Exam  Constitutional: She is oriented to person, place, and time. She appears well-developed and well-nourished. No distress.  HENT:  Head: Normocephalic.  Eyes: Pupils are equal, round, and reactive to light.  Neck: Neck supple.  Respiratory: Effort normal and breath sounds normal.  GI: Soft. She exhibits no distension. There is no tenderness. There is no rebound.  Musculoskeletal: Normal range of motion.  Neurological: She is alert and oriented to person, place, and time. She has normal reflexes. She displays normal reflexes.  Negative clonus   Skin: Skin is warm. She is not diaphoretic.  Psychiatric: Her behavior is normal.   Fetal Tracing: Baseline: 125 bpm Variability: Moderate  Accelerations: 15x15 Decelerations: none Toco: UI  MAU Course  Procedures  None  MDM  Elevated BP readings in MAU today   Elevated BP readings on 3 separate visits to the MAU.  PIH labs WNL today, patient is asymptomatic. Discussed patient with Dr. Kennon Rounds, including BP readings and labs. Patient is scheduled to see Dr. Ilda Basset tomorrow in the [place]; will send a message to him regarding elevated BP readings.   Assessment and Plan   A:  1. Pregnancy-induced hypertension in third trimester   2. Decreased  fetal movements in second trimester, single or unspecified fetus   3. NST (non-stress test) reactive     P:  Discharge home in stable condition Keep your appointment with Dr. Ilda Basset tomorrow 7/10 Preeclampsia precautions Strict return precautions Change positions slowly Fetal kick counts   Jermiya Reichl, Artist Pais, NP 04/23/2017 5:23 PM

## 2017-04-23 NOTE — Discharge Instructions (Signed)
Hypertension During Pregnancy Hypertension is also called high blood pressure. High blood pressure means that the force of your blood moving in your body is too strong. When you are pregnant, this condition should be watched carefully. It can cause problems for you and your baby. Follow these instructions at home: Eating and drinking  Drink enough fluid to keep your pee (urine) clear or pale yellow.  Eat healthy foods that are low in salt (sodium). ? Do not add salt to your food. ? Check labels on foods and drinks to see much salt is in them. Look on the label where you see "Sodium." Lifestyle  Do not use any products that contain nicotine or tobacco, such as cigarettes and e-cigarettes. If you need help quitting, ask your doctor.  Do not use alcohol.  Avoid caffeine.  Avoid stress. Rest and get plenty of sleep. General instructions  Take over-the-counter and prescription medicines only as told by your doctor.  While lying down, lie on your left side. This keeps pressure off your baby.  While sitting or lying down, raise (elevate) your feet. Try putting some pillows under your lower legs.  Exercise regularly. Ask your doctor what kinds of exercise are best for you.  Keep all prenatal and follow-up visits as told by your doctor. This is important. Contact a doctor if:  You have symptoms that your doctor told you to watch for, such as: ? Fever. ? Throwing up (vomiting). ? Headache. Get help right away if:  You have very bad pain in your belly (abdomen).  You are throwing up, and this does not get better with treatment.  You suddenly get swelling in your hands, ankles, or face.  You gain 4 lb (1.8 kg) or more in 1 week.  You get bleeding from your vagina.  You have blood in your pee.  You do not feel your baby moving as much as normal.  You have a change in vision.  You have muscle twitching or sudden tightening (spasms).  You have trouble breathing.  Your lips  or fingernails turn blue. This information is not intended to replace advice given to you by your health care provider. Make sure you discuss any questions you have with your health care provider. Document Released: 11/04/2010 Document Revised: 06/13/2016 Document Reviewed: 06/13/2016 Elsevier Interactive Patient Education  2017 Elsevier Inc.  

## 2017-04-24 ENCOUNTER — Other Ambulatory Visit (HOSPITAL_COMMUNITY)
Admission: RE | Admit: 2017-04-24 | Discharge: 2017-04-24 | Disposition: A | Discharge status: Home / Self Care | Payer: Medicaid Other | Source: Ambulatory Visit | Attending: Obstetrics & Gynecology | Admitting: Obstetrics & Gynecology

## 2017-04-24 ENCOUNTER — Ambulatory Visit (INDEPENDENT_AMBULATORY_CARE_PROVIDER_SITE_OTHER): Payer: Medicaid Other | Admitting: Obstetrics and Gynecology

## 2017-04-24 VITALS — BP 123/78 | HR 117 | Wt 286.0 lb

## 2017-04-24 DIAGNOSIS — O99211 Obesity complicating pregnancy, first trimester: Secondary | ICD-10-CM

## 2017-04-24 DIAGNOSIS — O0993 Supervision of high risk pregnancy, unspecified, third trimester: Secondary | ICD-10-CM | POA: Insufficient documentation

## 2017-04-24 DIAGNOSIS — E669 Obesity, unspecified: Secondary | ICD-10-CM

## 2017-04-24 DIAGNOSIS — O99213 Obesity complicating pregnancy, third trimester: Secondary | ICD-10-CM

## 2017-04-24 DIAGNOSIS — O133 Gestational [pregnancy-induced] hypertension without significant proteinuria, third trimester: Secondary | ICD-10-CM

## 2017-04-24 DIAGNOSIS — O35EXX0 Maternal care for other (suspected) fetal abnormality and damage, fetal genitourinary anomalies, not applicable or unspecified: Secondary | ICD-10-CM

## 2017-04-24 DIAGNOSIS — O358XX0 Maternal care for other (suspected) fetal abnormality and damage, not applicable or unspecified: Secondary | ICD-10-CM

## 2017-04-24 LAB — POCT URINALYSIS DIPSTICK
Bilirubin, UA: NEGATIVE
Blood, UA: NEGATIVE
Glucose, UA: NEGATIVE
Ketones, UA: NEGATIVE
Leukocytes, UA: NEGATIVE
Nitrite, UA: NEGATIVE
Protein, UA: NEGATIVE
Spec Grav, UA: 1.01 (ref 1.010–1.025)
Urobilinogen, UA: 0.2 E.U./dL
pH, UA: 7 (ref 5.0–8.0)

## 2017-04-24 NOTE — Addendum Note (Signed)
Addended by: Gita KudoLASSITER, Kamy Poinsett S on: 04/24/2017 04:53 PM   Modules accepted: Orders

## 2017-04-24 NOTE — Progress Notes (Signed)
Prenatal Visit Note Date: 04/24/2017 Clinic: [facility]  Subjective:  Kimberly Farmer is a 21 y.o. [name] at 5354w6d being seen today for ongoing prenatal care.  She is currently monitored for the following issues for this high-risk pregnancy and has Airway hyperreactivity; F/H of alcoholism; Broken leg; Osteochondritis dissecans of bilateral knees; Bone disease; Patellar instability; Chronic midline low back pain without sciatica; Supervision of normal pregnancy, antepartum; Obesity affecting pregnancy in first trimester; BMI 37.0-37.9, adult; Rubella non-immune status, antepartum; Encounter for repeat ultrasound of fetal pyelectasis in singleton pregnancy, antepartum; Preterm uterine contractions in third trimester, antepartum; and Gestational hypertension on her problem list.  Patient reports no complaints.   Contractions: Irregular. Vag. Bleeding: None.  Movement: Present. Denies leaking of fluid.   The following portions of the patient's history were reviewed and updated as appropriate: allergies, current medications, past family history, past medical history, past social history, past surgical history and problem list. Problem list updated.  Objective:   Vitals:   04/24/17 1608  BP: 123/78  Pulse: (!) 117  Weight: 286 lb (129.7 kg)    Fetal Status: Fetal Heart Rate (bpm): 145 Fundal Height: 36 cm Movement: Present  Presentation: Vertex  General:  Alert, oriented and cooperative. Patient is in no acute distress.  Skin: Skin is warm and dry. No rash noted.   Cardiovascular: Normal heart rate noted  Respiratory: Normal respiratory effort, no problems with respiration noted  Abdomen: Soft, gravid, appropriate for gestational age. Pain/Pressure: Absent     Pelvic:  Cervical exam performed Dilation: Closed Effacement (%): Thick Station: Ballotable  Extremities: Normal range of motion.  Edema: Trace  Mental Status: Normal mood and affect. Normal behavior. Normal  judgment and thought content.   Urinalysis:      Assessment and Plan:  Pregnancy: [name] at 3654w6d  1. Supervision of high risk pregnancy in third trimester Routine care. D/w her may have to do PCR if not back in time - Strep Gp B NAA  2. Pregnancy-induced hypertension in third trimester D/w pt that she has this dx. Recommend 37wk IOL which she is amenable. Set up for 2345 on 7/11 IOL. Pre-x precautions given. D/w her likely prolonged IOL given P0 and unripe cervix.   3. Encounter for repeat ultrasound of fetal pyelectasis in singleton pregnancy, antepartum Follow up PP   Preterm labor symptoms and general obstetric precautions including but not limited to vaginal bleeding, contractions, leaking of fluid and fetal movement were reviewed in detail with the patient. Please refer to After Visit Summary for other counseling recommendations.  Return in about 3 weeks (around 05/15/2017) for PP visit.   [place] BingPickens, Ansleigh Safer, MD

## 2017-04-25 ENCOUNTER — Other Ambulatory Visit: Payer: Self-pay | Admitting: Advanced Practice Midwife

## 2017-04-25 ENCOUNTER — Telehealth (HOSPITAL_COMMUNITY): Payer: Self-pay | Admitting: *Deleted

## 2017-04-25 NOTE — Telephone Encounter (Signed)
Preadmission screen  

## 2017-04-26 ENCOUNTER — Encounter (HOSPITAL_COMMUNITY): Payer: Self-pay

## 2017-04-26 ENCOUNTER — Inpatient Hospital Stay (HOSPITAL_COMMUNITY)
Admission: RE | Admit: 2017-04-26 | Discharge: 2017-04-30 | DRG: 775 | Disposition: A | Discharge status: Home / Self Care | Payer: Medicaid Other | Source: Ambulatory Visit | Attending: Obstetrics & Gynecology | Admitting: Obstetrics & Gynecology

## 2017-04-26 VITALS — BP 124/70 | HR 100 | Temp 98.3°F | Resp 18 | Ht 66.0 in | Wt 280.0 lb

## 2017-04-26 DIAGNOSIS — Z68.41 Body mass index (BMI) pediatric, 85th percentile to less than 95th percentile for age: Secondary | ICD-10-CM

## 2017-04-26 DIAGNOSIS — O358XX0 Maternal care for other (suspected) fetal abnormality and damage, not applicable or unspecified: Secondary | ICD-10-CM | POA: Diagnosis present

## 2017-04-26 DIAGNOSIS — Z3A37 37 weeks gestation of pregnancy: Secondary | ICD-10-CM

## 2017-04-26 DIAGNOSIS — O139 Gestational [pregnancy-induced] hypertension without significant proteinuria, unspecified trimester: Secondary | ICD-10-CM | POA: Diagnosis present

## 2017-04-26 DIAGNOSIS — Z87891 Personal history of nicotine dependence: Secondary | ICD-10-CM | POA: Diagnosis not present

## 2017-04-26 DIAGNOSIS — O99214 Obesity complicating childbirth: Secondary | ICD-10-CM | POA: Diagnosis present

## 2017-04-26 DIAGNOSIS — Z3480 Encounter for supervision of other normal pregnancy, unspecified trimester: Secondary | ICD-10-CM

## 2017-04-26 DIAGNOSIS — O99824 Streptococcus B carrier state complicating childbirth: Secondary | ICD-10-CM | POA: Diagnosis present

## 2017-04-26 DIAGNOSIS — O133 Gestational [pregnancy-induced] hypertension without significant proteinuria, third trimester: Secondary | ICD-10-CM

## 2017-04-26 DIAGNOSIS — O134 Gestational [pregnancy-induced] hypertension without significant proteinuria, complicating childbirth: Secondary | ICD-10-CM | POA: Diagnosis present

## 2017-04-26 DIAGNOSIS — O99820 Streptococcus B carrier state complicating pregnancy: Secondary | ICD-10-CM

## 2017-04-26 LAB — CBC
HCT: 30.7 % — ABNORMAL LOW (ref 36.0–46.0)
Hemoglobin: 9.6 g/dL — ABNORMAL LOW (ref 12.0–15.0)
MCH: 22.4 pg — ABNORMAL LOW (ref 26.0–34.0)
MCHC: 31.3 g/dL (ref 30.0–36.0)
MCV: 71.7 fL — ABNORMAL LOW (ref 78.0–100.0)
Platelets: 401 10*3/uL — ABNORMAL HIGH (ref 150–400)
RBC: 4.28 MIL/uL (ref 3.87–5.11)
RDW: 15.5 % (ref 11.5–15.5)
WBC: 13.6 10*3/uL — ABNORMAL HIGH (ref 4.0–10.5)

## 2017-04-26 LAB — TYPE AND SCREEN
ABO/RH(D): O POS
Antibody Screen: NEGATIVE

## 2017-04-26 LAB — STREP GP B NAA: Strep Gp B NAA: POSITIVE — AB

## 2017-04-26 LAB — GROUP B STREP BY PCR: Group B strep by PCR: POSITIVE — AB

## 2017-04-26 LAB — RPR: RPR Ser Ql: NONREACTIVE

## 2017-04-26 LAB — OB RESULTS CONSOLE GBS: GBS: POSITIVE

## 2017-04-26 MED ORDER — ACETAMINOPHEN 325 MG PO TABS: 650.0000 mg | ORAL_TABLET | ORAL | Status: DC | PRN

## 2017-04-26 MED ORDER — PENICILLIN G POT IN DEXTROSE 60000 UNIT/ML IV SOLN
3.0000 10*6.[IU] | INTRAVENOUS | Status: DC
  Administered 2017-04-27 – 2017-04-28 (×5): 3 10*6.[IU] via INTRAVENOUS
  Filled 2017-04-26 (×12): qty 50

## 2017-04-26 MED ORDER — ZOLPIDEM TARTRATE 5 MG PO TABS
5.0000 mg | ORAL_TABLET | Freq: Every evening | ORAL | Status: DC | PRN
  Administered 2017-04-26: 5 mg via ORAL
  Filled 2017-04-26: qty 1

## 2017-04-26 MED ORDER — FENTANYL CITRATE (PF) 100 MCG/2ML IJ SOLN
100.0000 ug | INTRAMUSCULAR | Status: DC | PRN
  Administered 2017-04-26 – 2017-04-27 (×7): 100 ug via INTRAVENOUS
  Filled 2017-04-26 (×7): qty 2

## 2017-04-26 MED ORDER — LIDOCAINE HCL (PF) 1 % IJ SOLN
30.0000 mL | INTRAMUSCULAR | Status: DC | PRN
  Filled 2017-04-26: qty 30

## 2017-04-26 MED ORDER — PENICILLIN G POTASSIUM 5000000 UNITS IJ SOLR
5.0000 10*6.[IU] | Freq: Once | INTRAVENOUS | Status: AC
  Administered 2017-04-27: 5 10*6.[IU] via INTRAVENOUS
  Filled 2017-04-26: qty 5

## 2017-04-26 MED ORDER — ONDANSETRON HCL 4 MG/2ML IJ SOLN: 4.0000 mg | Freq: Four times a day (QID) | INTRAMUSCULAR | Status: DC | PRN

## 2017-04-26 MED ORDER — LACTATED RINGERS IV SOLN
INTRAVENOUS | Status: DC
  Administered 2017-04-26 – 2017-04-28 (×6): via INTRAVENOUS

## 2017-04-26 MED ORDER — SOD CITRATE-CITRIC ACID 500-334 MG/5ML PO SOLN: 30.0000 mL | ORAL | Status: DC | PRN

## 2017-04-26 MED ORDER — LACTATED RINGERS IV SOLN
500.0000 mL | INTRAVENOUS | Status: DC | PRN
  Administered 2017-04-27 – 2017-04-28 (×3): 500 mL via INTRAVENOUS

## 2017-04-26 MED ORDER — OXYTOCIN 40 UNITS IN LACTATED RINGERS INFUSION - SIMPLE MED
2.5000 [IU]/h | INTRAVENOUS | Status: DC
  Filled 2017-04-26: qty 1000

## 2017-04-26 MED ORDER — OXYTOCIN BOLUS FROM INFUSION
500.0000 mL | Freq: Once | INTRAVENOUS | Status: AC
  Administered 2017-04-28: 500 mL via INTRAVENOUS

## 2017-04-26 MED ORDER — TERBUTALINE SULFATE 1 MG/ML IJ SOLN: 0.2500 mg | Freq: Once | INTRAMUSCULAR | Status: DC | PRN

## 2017-04-26 MED ORDER — MISOPROSTOL 25 MCG QUARTER TABLET
25.0000 ug | ORAL_TABLET | ORAL | Status: DC | PRN
  Administered 2017-04-26 (×3): 25 ug via VAGINAL
  Filled 2017-04-26 (×4): qty 1

## 2017-04-26 MED ORDER — OXYTOCIN 40 UNITS IN LACTATED RINGERS INFUSION - SIMPLE MED
1.0000 m[IU]/min | INTRAVENOUS | Status: DC
  Administered 2017-04-26 – 2017-04-27 (×2): 2 m[IU]/min via INTRAVENOUS

## 2017-04-26 NOTE — Progress Notes (Addendum)
Patient ID: Kimberly MainSamantha K Milford, female   DOB: 07/11/1996, 21 y.o.   MRN: [id number]  LABOR PROGRESS NOTE  Kimberly Farmer is a 21 y.o. [name] at 4354w1d  admitted for IOL for gHTN.   Subjective: Doing well  Objective: BP 126/72   Pulse (!) 101   Temp 98.3 F (36.8 C) (Oral)   Resp 15   Ht 5\' 6"  (1.676 m)   Wt 286 lb (129.7 kg)   LMP 07/26/2016   BMI 46.16 kg/m  or  Vitals:   04/26/17 1000 04/26/17 1100 04/26/17 1200 04/26/17 1348  BP: 125/69 (!) 141/85 126/72   Pulse: (!) 106 100 (!) 101   Resp: 15 15 15 15   Temp:   98.3 F (36.8 C)   TempSrc:   Oral   Weight:      Height:        Dilation: Fingertip Effacement (%): Thick Cervical Position: Posterior Station: -3 Presentation: Vertex Exam by:: Fellow MD FHT: baseline rate 145, moderate varibility, +acel, occasional variable Toco: irregular   Assessment / Plan: 21 y.o. [name] at 5154w1d here for IOL for gHTN  Labor: 3rd cytotec placed Fetal Wellbeing:  Cat I Pain Control:  Per patient's request Anticipated MOD:  SVD ID: GBS+ -- start PCN when in active labor  Frederik PearJulie P Faye Strohman, MD 04/26/2017, 1:54 PM

## 2017-04-26 NOTE — Progress Notes (Signed)
Telemetry monitors applied.  Pt up and walking with family

## 2017-04-26 NOTE — Progress Notes (Signed)
Attempt to place a Foley bulb in cervix unsuccessful.

## 2017-04-26 NOTE — Progress Notes (Signed)
Labor Progress Note  S: [name] here for IOL for gHTN. Patient asleep and resting comfortably in bed.  O:  BP 108/74   Pulse 98   Temp 97.6 F (36.4 C) (Oral)   Resp 16   Ht 5\' 6"  (1.676 m)   Wt 129.7 kg (286 lb)   LMP 07/26/2016   BMI 46.16 kg/m    Cat 1. Baseline FHR 135, mod variability, accels present, no decels.  CVE: Dilation: Closed Effacement (%): Thick Cervical Position: Posterior Station: Ballotable Exam by:: L.Stubbs, RN   A&P: 21 y.o. [name] 3912w1d  1st Cytotec given Expectant management Anticipate SVD  Kimberly Farmer, Medical Student 5:22 AM

## 2017-04-26 NOTE — Progress Notes (Signed)
Dr Erin FullingHarraway-Smith and Dr Omer JackMumaw in department.  Will be rounding on pt momentarily.

## 2017-04-26 NOTE — Progress Notes (Signed)
Patient ID: Annie MainSamantha K Reppert, female   DOB: 07/11/1996, 21 y.o.   MRN: [id number]  S: Patient seen & examined for progress of labor induction. Patient comfortable.    O:  Vitals:   04/26/17 1401 04/26/17 1501 04/26/17 1605 04/26/17 1701  BP:      Pulse:      Resp: 18 16 16 16   Temp:      TempSrc:      Weight:      Height:        Dilation: 1 Effacement (%): Thick Cervical Position: Posterior Station: -3 Presentation: Vertex Exam by:: Dr Omer JackMumaw  FB placed with ease, inflated with 60cc saline.   FHT: 135bpm, mod var, +accels, no decels TOCO: Quiet   A/P: FB placed Continue cytotec Continue expectant management Anticipate SVD

## 2017-04-26 NOTE — Progress Notes (Signed)
Pt contracting too often to place cytotec.  Faculty updated.  Will come by room after C/S another pt and reassess POC.

## 2017-04-26 NOTE — H&P (Signed)
LABOR ADMISSION HISTORY AND PHYSICAL  Kimberly Farmer is a 21 y.o. female [name] with IUP at 4874w1d by US presenting for IOL for gHTN. She reports fetal movements. No painful contractions, leakage of fluid, or vaginal bleeding. No blurry vision, headaches or peripheral edema, and RUQ pain.  She plans on breast feeding. She request POP for birth control.  Dating: By [place] --->  Estimated Date of Delivery: 05/16/17  Sono:    @[redacted]w[redacted]d , CWD, normal anatomy, cephalic presentation, placenta posterior above cervical os, 53% EFW   Prenatal History/Complications:  Past Medical History: Past Medical History:  Diagnosis Date  . Arthritis    Bilateral knees and back  . Lumbar disc disease     Past Surgical History: Past Surgical History:  Procedure Laterality Date  . KNEE SURGERY Left 2011   [name] has had 5 surgeries on her knee  . TONSILLECTOMY AND ADENOIDECTOMY  2007    Obstetrical History: OB History    Gravida Para Term Preterm AB Living   2 0 0 0 1 0   SAB TAB Ectopic Multiple Live Births   1 0 0 0 0      Social History: Social History   Social History  . Marital status: Single    Spouse name: N/A  . Number of children: N/A  . Years of education: N/A   Social History Main Topics  . Smoking status: Former Smoker    Types: E-cigarettes  . Smokeless tobacco: Former NeurosurgeonUser     Comment: Vape - Reduced Nicotine  . Alcohol use No  . Drug use: No  . Sexual activity: Yes    Partners: Male    Birth control/ protection: None   Other Topics Concern  . None   Social History Narrative  . None    Family History: Family History  Problem Relation Age of Onset  . Hypertension Mother   . Alcohol abuse Father   . Diabetes Maternal Grandmother   . Hypertension Maternal Grandmother   . Cancer Paternal Grandfather        skin    Allergies: Allergies  Allergen Reactions  . Hydrocodone Other (See Comments)    Other Reaction: SEVERE HA & GI Upset  . Oxycodone Other (See  Comments)    Other Reaction: SEVERE HA & GI Upset  . Tape Rash    Prescriptions Prior to Admission  Medication Sig Dispense Refill Last Dose  . Prenatal Vit-Fe Fumarate-FA (PRENATAL MULTIVITAMIN) TABS tablet Take 1 tablet by mouth daily at 12 noon.   04/25/2017 at 1300     Review of Systems   All systems reviewed and negative except as stated in HPI  Blood pressure 127/72, pulse 98, temperature 98.2 F (36.8 C), temperature source Oral, resp. rate 16, height 5\' 6"  (1.676 m), weight 129.7 kg (286 lb), last menstrual period 07/26/2016. General appearance: alert, cooperative and no distress Presentation: cephalic Fetal monitoring: Baseline: 130 bpm, Variability: Good {> 6 bpm), Accelerations: Reactive and Decelerations: Absent Uterine activity: None  Dilation: Closed Effacement (%): Thick Station: Ballotable Exam by:: L.Stubbs, RN   Prenatal labs: ABO, Rh: --/--/O POS (07/12 0142) Antibody: NEG (07/12 0142) Rubella: Nonimmune RPR: Non Reactive (05/14 0814)  HBsAg: Negative (01/08 1420)  HIV: Non Reactive (05/14 0814)  GBS:    1 hr Glucola: 122, normal Genetic screening: Normal Anatomy US: Normal anatomy  Prenatal Transfer Tool  Maternal Diabetes: No Genetic Screening: Normal Maternal Ultrasounds/Referrals: Abnormal:  Findings:   Fetal renal pyelectasis- mild, bilateral Fetal Ultrasounds  or other Referrals:  None Maternal Substance Abuse:  No Significant Maternal Medications:  None Significant Maternal Lab Results: Lab values include: Other: GBS culture and PCR pending  Results for orders placed or performed during the hospital encounter of 04/26/17 (from the past 24 hour(s))  CBC   Collection Time: 04/26/17  1:41 AM  Result Value Ref Range   WBC 13.6 (H) 4.0 - 10.5 K/uL   RBC 4.28 3.87 - 5.11 MIL/uL   Hemoglobin 9.6 (L) 12.0 - 15.0 g/dL   HCT 96.0 (L) 45.4 - 09.8 %   MCV 71.7 (L) 78.0 - 100.0 fL   MCH 22.4 (L) 26.0 - 34.0 pg   MCHC 31.3 30.0 - 36.0 g/dL   RDW  11.9 14.7 - 82.9 %   Platelets 401 (H) 150 - 400 K/uL  Type and screen   Collection Time: 04/26/17  1:42 AM  Result Value Ref Range   ABO/RH(D) O POS    Antibody Screen NEG    Sample Expiration 04/29/2017     Patient Active Problem List   Diagnosis Date Noted  . Gestational hypertension 04/23/2017  . Preterm uterine contractions in third trimester, antepartum 04/19/2017  . Encounter for repeat ultrasound of fetal pyelectasis in singleton pregnancy, antepartum 04/02/2017  . Rubella non-immune status, antepartum 10/25/2016  . Obesity affecting pregnancy in first trimester 10/11/2016  . BMI 37.0-37.9, adult 10/11/2016  . Supervision of normal pregnancy, antepartum 10/10/2016  . Chronic midline low back pain without sciatica 04/24/2016  . 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 F/H of alcoholism 09/27/2015  . Broken leg 09/27/2015  . Bone disease 09/24/2014  . 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    Assessment: Kimberly Farmer is a 21 y.o. [name] at [redacted]w[redacted]d here for IOL for gHTN  #Labor: Expectant management #Pain: Epidural PRN #FWB: Cat 1 #ID:  #MOF: Breast #MOC: POP #Circ: Yes, outpatient   Jeanie Cooks, Medical Student 04/26/2017, 3:18 AM   CNM attestation:  I have seen and examined this patient; I agree with above documentation in the med student's note.   Kimberly Farmer is a 21 y.o. [name] here for IOL for gHTN  PE: BP 126/66   Pulse (!) 104   Temp 97.6 F (36.4 C) (Oral)   Resp 16   Ht 5\' 6"  (1.676 m)   Wt 129.7 kg (286 lb)   LMP 07/26/2016   BMI 46.16 kg/m  Gen: calm comfortable, NAD Resp: normal effort, no distress Abd: gravid  ROS, labs, PMH reviewed  Nl P/C ratio and pre-e labs on 7/9  Plan: Admit to [facility] cytotec for cx ripening until able to place cervical foley GBS PCR as GBS culture from 7/10 is still pending Anticipate SVD  Cam Hai CNM 04/26/2017, 6:34  AM

## 2017-04-26 NOTE — Anesthesia Pain Management Evaluation Note (Signed)
  CRNA Pain Management Visit Note  Patient: Kimberly Farmer, 21 y.o., female  "Hello I am a [facility]. We have an anesthesia team available at all times to provide care throughout the hospital, including epidural management and anesthesia for C-section. I don't know your plan for the delivery whether it a natural birth, water birth, IV sedation, nitrous supplementation, doula or epidural, but we want to meet your pain goals."   1.Was your pain managed to your expectations on prior hospitalizations?   Yes   2.What is your expectation for pain management during this hospitalization?    natural  3.How can we help you reach that goal? Nursing support   Record the patient's initial score and the patient's pain goal.  0/10  Pain: 0/10  Pain Goal: 10/10 [facility] wants you to be able to say your pain was always managed very well.  Salome ArntSterling, Falicity Sheets Marie 04/26/2017

## 2017-04-26 NOTE — Progress Notes (Signed)
Foley fell out around 2215.  Cx 4/thick/post/-3.  FHR Cat 1.  Mild and irregular ctx  Will start pitocin  Vitals:   04/26/17 1701 04/26/17 1928 04/26/17 2102 04/26/17 2305  BP:  117/72  136/82  Pulse:  79  85  Resp: 16   16  Temp:   98.5 F (36.9 C) 98.7 F (37.1 C)  TempSrc:   Oral Oral  Weight:      Height:        Temp:  [97.6 F (36.4 C)-98.7 F (37.1 C)] 98.7 F (37.1 C) (07/12 2305) Pulse Rate:  [79-118] 85 (07/12 2305) Resp:  [15-18] 16 (07/12 2305) BP: (101-149)/(60-97) 136/82 (07/12 2305) Weight:  [129.7 kg (286 lb)] 129.7 kg (286 lb) (07/12 0036) No intake/output data recorded. No intake/output data recorded. No intake or output data in the 24 hours ending 04/26/17 2345   Current Vital Signs 24h Vital Sign Ranges  T 98.7 F (37.1 C) Temp  Avg: 98.3 F (36.8 C)  Min: 97.6 F (36.4 C)  Max: 98.7 F (37.1 C)  BP 136/82 BP  Min: 101/63  Max: 149/97  HR 85 Pulse  Avg: 97.4  Min: 79  Max: 118  RR 16 Resp  Avg: 16  Min: 15  Max: 18  SaO2     No Data Recorded       24 Hour I/O Current Shift I/O  Time Ins Outs No intake/output data recorded. No intake/output data recorded.   Patient Vitals for the past 12 hrs:  BP Temp Temp src Pulse Resp  04/26/17 2305 136/82 98.7 F (37.1 C) Oral 85 16  04/26/17 2102 - 98.5 F (36.9 C) Oral - -  04/26/17 1928 117/72 - - 79 -  04/26/17 1701 - - - - 16  04/26/17 1700 137/90 - - 80 -  04/26/17 1605 - - - - 16  04/26/17 1501 - - - - 16  04/26/17 1401 - - - - 18  04/26/17 1348 - - - - 15  04/26/17 1200 126/72 98.3 F (36.8 C) Oral (!) 101 15

## 2017-04-26 NOTE — Progress Notes (Signed)
Patient ID: Kimberly Farmer, female   DOB: 01/24/1996, 21 y.o.   MRN: [id number]  Doing well BP 126/66, other VSS FHR 150s, +accels, no decels Ctx q 2 min, mild  IUP@term  gHTN Cx unfavorable  Second cytotec placed Anticipate a couple more doses before cervical foley may be able to be placed  Cam HaiSHAW, Ozelle Brubacher [facility] Hospital[facility] 04/26/2017 6:31 AM

## 2017-04-27 ENCOUNTER — Inpatient Hospital Stay (HOSPITAL_COMMUNITY): Payer: Medicaid Other | Admitting: Anesthesiology

## 2017-04-27 LAB — CBC
HCT: 32 % — ABNORMAL LOW (ref 36.0–46.0)
Hemoglobin: 9.8 g/dL — ABNORMAL LOW (ref 12.0–15.0)
MCH: 22.2 pg — ABNORMAL LOW (ref 26.0–34.0)
MCHC: 30.6 g/dL (ref 30.0–36.0)
MCV: 72.6 fL — ABNORMAL LOW (ref 78.0–100.0)
Platelets: 364 10*3/uL (ref 150–400)
RBC: 4.41 MIL/uL (ref 3.87–5.11)
RDW: 15.2 % (ref 11.5–15.5)
WBC: 17.1 10*3/uL — ABNORMAL HIGH (ref 4.0–10.5)

## 2017-04-27 LAB — URINE CYTOLOGY ANCILLARY ONLY
Chlamydia: NEGATIVE
Neisseria Gonorrhea: NEGATIVE

## 2017-04-27 MED ORDER — LACTATED RINGERS IV SOLN
500.0000 mL | Freq: Once | INTRAVENOUS | Status: AC
  Administered 2017-04-27: 500 mL via INTRAVENOUS

## 2017-04-27 MED ORDER — DIPHENHYDRAMINE HCL 50 MG/ML IJ SOLN: 12.5000 mg | INTRAMUSCULAR | Status: DC | PRN

## 2017-04-27 MED ORDER — FENTANYL 2.5 MCG/ML BUPIVACAINE 1/10 % EPIDURAL INFUSION (WH - ANES)
14.0000 mL/h | INTRAMUSCULAR | Status: DC | PRN
  Administered 2017-04-27 – 2017-04-28 (×3): 14 mL/h via EPIDURAL
  Filled 2017-04-27 (×3): qty 100

## 2017-04-27 MED ORDER — PHENYLEPHRINE 40 MCG/ML (10ML) SYRINGE FOR IV PUSH (FOR BLOOD PRESSURE SUPPORT)
80.0000 ug | PREFILLED_SYRINGE | INTRAVENOUS | Status: DC | PRN
  Filled 2017-04-27: qty 5
  Filled 2017-04-27: qty 10

## 2017-04-27 MED ORDER — EPHEDRINE 5 MG/ML INJ
10.0000 mg | INTRAVENOUS | Status: DC | PRN
  Filled 2017-04-27: qty 2

## 2017-04-27 MED ORDER — LIDOCAINE HCL (PF) 1 % IJ SOLN
INTRAMUSCULAR | Status: DC | PRN
Start: 2017-04-27 — End: 2017-04-28
  Administered 2017-04-27 (×2): 4 mL via EPIDURAL

## 2017-04-27 MED ORDER — PHENYLEPHRINE 40 MCG/ML (10ML) SYRINGE FOR IV PUSH (FOR BLOOD PRESSURE SUPPORT)
80.0000 ug | PREFILLED_SYRINGE | INTRAVENOUS | Status: DC | PRN
  Filled 2017-04-27: qty 5

## 2017-04-27 NOTE — Anesthesia Preprocedure Evaluation (Signed)
Anesthesia Evaluation  Patient identified by MRN, date of birth, ID band Patient awake    Reviewed: Allergy & Precautions, H&P , NPO status , Patient's Chart, lab work & pertinent test results  History of Anesthesia Complications Negative for: history of anesthetic complications  Airway Mallampati: II  TM Distance: >3 FB Neck ROM: full    Dental no notable dental hx. (+) Teeth Intact   Pulmonary former smoker,    Pulmonary exam normal breath sounds clear to auscultation       Cardiovascular negative cardio ROS Normal cardiovascular exam Rhythm:regular Rate:Normal     Neuro/Psych negative neurological ROS  negative psych ROS   GI/Hepatic negative GI ROS, Neg liver ROS,   Endo/Other  Morbid obesity  Renal/GU negative Renal ROS  negative genitourinary   Musculoskeletal   Abdominal   Peds  Hematology  (+) anemia ,   Anesthesia Other Findings Super obese  Reproductive/Obstetrics (+) Pregnancy                             Anesthesia Physical Anesthesia Plan  ASA: III  Anesthesia Plan: Epidural   Post-op Pain Management:    Induction:   PONV Risk Score and Plan:   Airway Management Planned:   Additional Equipment:   Intra-op Plan:   Post-operative Plan:   Informed Consent: I have reviewed the patients History and Physical, chart, labs and discussed the procedure including the risks, benefits and alternatives for the proposed anesthesia with the patient or authorized representative who has indicated his/her understanding and acceptance.     Plan Discussed with:   Anesthesia Plan Comments:         Anesthesia Quick Evaluation

## 2017-04-27 NOTE — Progress Notes (Signed)
Patient feeling inc pressure, checked and 7/80/-2. FHR 150 bmp with accels and no decels. Noted to have resting uterine tone elevated after IUPC was re-zeroed. IUPC replaced and resting uterine tone now normal.  Anticipate SVD. Continue expectant management.

## 2017-04-27 NOTE — Progress Notes (Signed)
Kimberly MainSamantha K Farmer is a 21 y.o. [name] at 462w2d admitted for [facility] of labor [facility].  Subjective: Pt comfortable with epidural, family in room for support  Objective: BP 109/67   Pulse 84   Temp 97.9 F (36.6 C) (Oral)   Resp 18   Ht 5\' 6"  (1.676 m)   Wt 286 lb (129.7 kg)   LMP 07/26/2016   BMI 46.16 kg/m  No intake/output data recorded. No intake/output data recorded.  FHT:  FHR: 140 bpm, variability: moderate,  accelerations:  Present,  decelerations:  Present repetitive variables, resolved after Pit off/IV fluids/Position change but resumed following AROM UC:   regular, every 2 minutes SVE:   Dilation: 5 Effacement (%): 70 Station: -2 Exam by:: Leftwich-Kirby, CNM AROM with clear fluid, pt tolerated well  Labs: Lab Results  Component Value Date   WBC 17.1 (H) 04/27/2017   HGB 9.8 (L) 04/27/2017   HCT 32.0 (L) 04/27/2017   MCV 72.6 (L) 04/27/2017   PLT 364 04/27/2017    Assessment / Plan: Induction of labor due to gestational hypertension Pitocin off  Labor: Progressing normally Preeclampsia:  n/a Fetal Wellbeing:  Category II Pain Control:  Epidural I/D:  GBS pos on PCN Anticipated MOD:  NSVD  Sharen CounterLisa Leftwich-Kirby 04/27/2017, 1:06 PM

## 2017-04-27 NOTE — Anesthesia Procedure Notes (Addendum)
Epidural Patient location during procedure: OB Start time: 04/27/2017 10:07 AM  Staffing Anesthesiologist: Karna ChristmasELLENDER, RYAN P Performed: anesthesiologist   Preanesthetic Checklist Completed: patient identified, site marked, pre-op evaluation, timeout performed, IV checked, risks and benefits discussed and monitors and equipment checked  Epidural Patient position: sitting Prep: DuraPrep Patient monitoring: heart rate, cardiac monitor, continuous pulse ox and blood pressure Approach: midline Location: L2-L3 Injection technique: LOR air  Needle:  Needle type: Tuohy  Needle gauge: 17 G Needle length: 9 cm Needle insertion depth: 9 cm Catheter type: closed end flexible Catheter size: 19 Gauge Catheter at skin depth: 14 cm Test dose: negative and Other  Assessment Events: blood not aspirated, injection not painful, no injection resistance and negative IV test  Additional Notes Informed consent obtained prior to proceeding including risk of failure, 1% risk of PDPH, risk of minor discomfort and bruising. Discussed alternatives to epidural analgesia and patient desires to proceed.  Timeout performed pre-procedure verifying patient name, procedure, and platelet count.  Patient tolerated procedure well. Reason for block:procedure for pain

## 2017-04-27 NOTE — Progress Notes (Signed)
Patient ID: Kimberly MainSamantha K Shelley, female   DOB: 10/26/1995, 21 y.o.   MRN: [id number]  Mostly comfortable w/ epidural, other than some back pain  BP 132/91, P103, T99.8 FHR 120-130, +accels, no decels Ctx q 2-4 mins w/ 1414mu/min Pit, MVUs hovering around adequate, 185-195 Cx deferred, but was 5/80/-2 @ 1945  IUP@term  IOL process gHTN  Continue to increase Pit to achieve adequate MVUs over time Fluid bolus for temp  Cam HaiSHAW, Onell Mcmath CNM 04/27/2017 9:46 PM

## 2017-04-28 ENCOUNTER — Encounter (HOSPITAL_COMMUNITY): Payer: Self-pay

## 2017-04-28 DIAGNOSIS — O134 Gestational [pregnancy-induced] hypertension without significant proteinuria, complicating childbirth: Secondary | ICD-10-CM

## 2017-04-28 DIAGNOSIS — Z3A37 37 weeks gestation of pregnancy: Secondary | ICD-10-CM

## 2017-04-28 MED ORDER — IBUPROFEN 600 MG PO TABS
600.0000 mg | ORAL_TABLET | Freq: Four times a day (QID) | ORAL | Status: DC
  Administered 2017-04-28 – 2017-04-30 (×7): 600 mg via ORAL
  Filled 2017-04-28 (×7): qty 1

## 2017-04-28 MED ORDER — ACETAMINOPHEN 325 MG PO TABS
650.0000 mg | ORAL_TABLET | ORAL | Status: DC | PRN
  Administered 2017-04-28: 650 mg via ORAL
  Filled 2017-04-28: qty 2

## 2017-04-28 MED ORDER — DIPHENHYDRAMINE HCL 25 MG PO CAPS: 25.0000 mg | ORAL_CAPSULE | Freq: Four times a day (QID) | ORAL | Status: DC | PRN

## 2017-04-28 MED ORDER — SENNOSIDES-DOCUSATE SODIUM 8.6-50 MG PO TABS
2.0000 | ORAL_TABLET | ORAL | Status: DC
  Administered 2017-04-29 – 2017-04-30 (×2): 2 via ORAL
  Filled 2017-04-28 (×2): qty 2

## 2017-04-28 MED ORDER — WITCH HAZEL-GLYCERIN EX PADS: 1.0000 "application " | MEDICATED_PAD | CUTANEOUS | Status: DC | PRN

## 2017-04-28 MED ORDER — BENZOCAINE-MENTHOL 20-0.5 % EX AERO: 1.0000 "application " | INHALATION_SPRAY | CUTANEOUS | Status: DC | PRN

## 2017-04-28 MED ORDER — TETANUS-DIPHTH-ACELL PERTUSSIS 5-2.5-18.5 LF-MCG/0.5 IM SUSP: 0.5000 mL | Freq: Once | INTRAMUSCULAR | Status: DC

## 2017-04-28 MED ORDER — DIBUCAINE 1 % RE OINT
1.0000 | TOPICAL_OINTMENT | RECTAL | Status: DC | PRN
Start: 2017-04-28 — End: 2017-04-30

## 2017-04-28 MED ORDER — ONDANSETRON HCL 4 MG/2ML IJ SOLN: 4.0000 mg | INTRAMUSCULAR | Status: DC | PRN

## 2017-04-28 MED ORDER — ONDANSETRON HCL 4 MG PO TABS: 4.0000 mg | ORAL_TABLET | ORAL | Status: DC | PRN

## 2017-04-28 MED ORDER — ZOLPIDEM TARTRATE 5 MG PO TABS: 5.0000 mg | ORAL_TABLET | Freq: Every evening | ORAL | Status: DC | PRN

## 2017-04-28 MED ORDER — LACTATED RINGERS IV SOLN
INTRAVENOUS | Status: DC
  Administered 2017-04-28: 03:00:00 via INTRAUTERINE

## 2017-04-28 MED ORDER — PRENATAL MULTIVITAMIN CH
1.0000 | ORAL_TABLET | Freq: Every day | ORAL | Status: DC
  Administered 2017-04-28 – 2017-04-29 (×2): 1 via ORAL
  Filled 2017-04-28 (×2): qty 1

## 2017-04-28 MED ORDER — COCONUT OIL OIL
1.0000 "application " | TOPICAL_OIL | Status: DC | PRN
  Administered 2017-04-28: 1 via TOPICAL
  Filled 2017-04-28: qty 120

## 2017-04-28 MED ORDER — SIMETHICONE 80 MG PO CHEW: 80.0000 mg | CHEWABLE_TABLET | ORAL | Status: DC | PRN

## 2017-04-28 NOTE — Lactation Note (Signed)
This note was copied from a baby's chart. Lactation Consultation Note  Patient Name: Boy Aftyn Hamman ZHYEyvonne MechanicQM'VToday's Date: 04/28/2017 Reason for consult: Initial assessment;NICU baby  NICU baby 7 hours old. Mom reports that she is comfortable with hand expression and has colostrum container with 1-2 ml of colostrum collected. Mom using DEBP for the first times and states that she will be taking EBM to NICU after she finishes pumping. Discussed progression of milk coming to volume and supply and demand. Enc mom to pump every 2-3 hours for a total of 8-12 times/24 hours followed by hand expression. Discussed the benefits of hospital-grade DEBP and mom aware of rental program. Mom given NICU booklet with EBM storage guidelines reviewed, and discussed how to transport EBM back to the hospital. Mom aware of pumping rooms in NICU as well. Mom aware of OP/BFSG and LC phone line assistance after D/C.   Maternal Data Has patient been taught Hand Expression?: Yes Does the patient have breastfeeding experience prior to this delivery?: No  Feeding    LATCH Score/Interventions                      Lactation Tools Discussed/Used Tools: Pump Breast pump type: Double-Electric Breast Pump Pump Review: Setup, frequency, and cleaning;Milk Storage Initiated by:: bedside RN Date initiated:: 04/28/17   Consult Status Consult Status: Follow-up Date: 04/29/17 Follow-up type: In-patient    Sherlyn HayJennifer D Jhalil Silvera 04/28/2017, 12:15 PM

## 2017-04-28 NOTE — Anesthesia Postprocedure Evaluation (Signed)
Anesthesia Post Note  Patient: Kimberly Farmer  Procedure(s) Performed: * No procedures listed *     Patient location during evaluation: [facility] Unit Anesthesia Type: Epidural Level of consciousness: awake and alert, oriented and patient cooperative Pain management: pain level controlled Vital Signs Assessment: post-procedure vital signs reviewed and stable Respiratory status: spontaneous breathing Cardiovascular status: stable Postop Assessment: no headache, epidural receding, patient able to bend at knees and no signs of nausea or vomiting Anesthetic complications: no Comments: Pt sleeping prior to interview.  Denies pain.     Last Vitals:  Vitals:   04/28/17 0600 04/28/17 0641  BP: 135/67 125/72  Pulse: 95 83  Resp: 18   Temp: 37 C 36.8 C    Last Pain:  Vitals:   04/28/17 0730  TempSrc:   PainSc: Asleep   Pain Goal: Patients Stated Pain Goal: 3 (04/28/17 0641)               Merrilyn PumaWRINKLE,Raelan Burgoon

## 2017-04-28 NOTE — Progress Notes (Signed)
Pt and FOB up to visit baby in NICU.  

## 2017-04-28 NOTE — Progress Notes (Signed)
Pt and FOB up to visit baby in NICU.

## 2017-04-28 NOTE — Progress Notes (Signed)
Pt and Friend going up to visit baby in NICU. Pt states she will be doing skin to skin time with baby this visit.

## 2017-04-29 NOTE — Progress Notes (Signed)
Tech called this RN to check patient because patient was in the room crying. Pt states that she was upset about the baby needing x-rays. Asked the patient if there was any thing that she needed and she declined. Will continue to monitor. Carmelina DaneERRI L Ajaya Crutchfield, RN

## 2017-04-29 NOTE — Lactation Note (Signed)
This note was copied from a baby's chart. Lactation Consultation Note  Patient Name: Kimberly Farmer FAOZH'YToday's Date: 04/29/2017 Reason for consult: Follow-up assessment Baby at 30 hr of life. Mom reports her nipples re cracked and sore. She is using the #24 flange and pumping on level 5. Instructed mom to use coconut oil on her breast before pumping, move to the #27 flanges, do not press the pump flanges into the breast, and turn down the suction to 1-4. Mom will call when she pumps again to have lactation assess the flange size. Mom was in the NICU visiting with the baby and did not want to pump at this time. Reviewed nipple care and breast changes. Mom is aware of lactation services and support group.    Maternal Data    Feeding Feeding Type: Breast Milk with Formula added Nipple Type: Slow - flow Length of feed: 5 min  LATCH Score/Interventions                      Lactation Tools Discussed/Used     Consult Status Consult Status: Follow-up Date: 04/30/17 Follow-up type: In-patient    Kimberly Farmer 04/29/2017, 11:32 AM

## 2017-04-29 NOTE — Progress Notes (Signed)
Patient ID: Kimberly Farmer, female   DOB: 05/26/1996, 21 y.o.   MRN: [id number] Post Partum Day 1 Subjective: Pt without complaints this morning. Ambulating and voiding without problems. Pain controlled. Tolerating diet. Breast pumping. Lochia min. Reports infant stable in NICU.   Objective: Blood pressure 124/74, pulse 100, temperature 97.9 F (36.6 C), temperature source Oral, resp. rate 20, height 5\' 6"  (1.676 m), weight 127 kg (280 lb), last menstrual period 07/26/2016, SpO2 99 %, unknown if currently breastfeeding.  Physical Exam:  General: alert Lochia: appropriate Uterine Fundus: firm DVT Evaluation: No evidence of DVT seen on physical exam.   Recent Labs  04/27/17 0845  HGB 9.8*  HCT 32.0*    Assessment/Plan: Plan for discharge tomorrow   LOS: 3 days   Kimberly StaggersMichael L Yarexi Pawlicki 04/29/2017, 7:48 AM

## 2017-04-30 MED ORDER — IBUPROFEN 600 MG PO TABS: 600.0000 mg | ORAL_TABLET | Freq: Four times a day (QID) | ORAL | 0 refills | Status: DC

## 2017-04-30 NOTE — Progress Notes (Signed)
Patient discharged with printed instructions. Pt verbalized an understanding. No concern noted. Carmelina DaneERRI L Joslyne Marshburn, RN

## 2017-04-30 NOTE — Clinical Social Work Maternal (Signed)
  CLINICAL SOCIAL WORK MATERNAL/CHILD NOTE  Patient Details  Name: Kimberly Farmer MRN: [id number] Date of Birth: 02/05/1996  Date:  04/30/2017  Clinical Social Worker Initiating Note:  Xariah Silvernail, LCSW Date/ Time Initiated:  04/30/17/1030     Child's Name:  Kimberly Farmer   Legal Guardian:  Other (Comment) (Parents: Una Face and Caleb Farmer)   Need for Interpreter:  None   Date of Referral:   (No referral-NICU admission)     Reason for Referral:      Referral Source:      Address:  [address] [place], [place] [address]  Phone number:  [phone] (FOB's cell: [phone])   Household Members:  Significant Other   Natural Supports (not living in the home):  Immediate Family, Friends, Extended Family (Parents report that they have a good support system.)   Professional Supports: None   Employment:     Type of Work:     Education:      Financial Resources:  Medicaid   Other Resources:      Cultural/Religious Considerations Which May Impact Care: None stated.  MOB's facesheet notes religion as Christian.  Strengths:  Ability to meet basic needs , Home prepared for child    Risk Factors/Current Problems:  None   Cognitive State:  Able to Concentrate , Alert , Linear Thinking , Insightful    Mood/Affect:  Calm , Euthymic , Interested , Comfortable    CSW Assessment: CSW met with parents in MOB's third floor room/320 to offer support, introduce services and complete assessment due to baby's admission to NICU at 37.3 weeks following Code Apgar.  Parents were quiet, but extremely pleasant and welcoming of CSW's visit. Parents report they are doing well and seem to have a good understanding of baby's medical needs at this time.  FOB was quiet, but engaged.  MOB did most of the talking.  She concludes that she just wants her baby to get better and understands that no one can tell her how long this will take.  She states, "he will let us know  when he is ready to go home."  CSW praised her for this outlook on the situation.  She states she has felt very comfortable with the NICU staff and feels "he is in good hands.  Everyone has been so wonderful."     Parents report that they have all supplies needed for baby at home and are aware of SIDS precautions as reviewed by CSW.  They state they have a great support system and that they are engaged, but have postponed the wedding to focus on baby.  MOB reports that they found out she was pregnant 7 days after getting engaged.   CSW discussed common emotions often experienced after delivery and with a NICU hospitalization.  CSW provided information about PMADs and informed parents of ongoing support services offered by NICU CSW.  Parents stated appreciation and no current concerns.  Parents report no hx of mental illness.   CSW offered gas cards as parents will be traveling from [place] to be with baby after MOB's discharged today.  Parents were appreciative.  CSW provided parents with contact information and encouraged them to call anytime.    CSW Plan/Description:  No Further Intervention Required/No Barriers to Discharge, Patient/Family Education     Sherrise Liberto Elizabeth, LCSW 04/30/2017, 12:43 PM 

## 2017-04-30 NOTE — Lactation Note (Addendum)
This note was copied from a baby's chart. Lactation Consultation Note  Patient Name: Kimberly Eyvonne MechanicSamantha Farmer ZOXWR'UToday's Date: 04/30/2017 Reason for consult: Follow-up assessment;NICU baby  NICU baby 2554 hours old. Mom reports that she has been pumping at least 8-12 times/24 hours followed by hand expression. Mom states that she intends to use the hospital-grade pumps in NICU often while visiting the baby. Mom aware of DEBP rental program as needed. Mom reports that her nipples were sore, but after "drinking more water and getting enough rest" her nipples are fine. Mom states that she was able to pump and hand express 10 ml this morning and then took the EBM to the NICU. Enc mom to take pumping supplies, and mom aware of OP/BFSG and LC phone line assistance after D/C.   Maternal Data    Feeding    LATCH Score/Interventions                      Lactation Tools Discussed/Used Tools: Pump Breast pump type: Double-Electric Breast Pump   Consult Status Consult Status: PRN    Sherlyn HayJennifer D Halei Hanover 04/30/2017, 11:01 AM

## 2017-04-30 NOTE — Discharge Instructions (Signed)

## 2017-04-30 NOTE — Discharge Summary (Signed)
OB Discharge Summary     Patient Name: Annie MainSamantha K Acree DOB: 12/27/1995 MRN: [id number]  Date of admission: 04/26/2017 Delivering MD: Cam HaiSHAW, KIMBERLY D   Date of discharge: 04/30/2017  Admitting diagnosis: INDUCTION Intrauterine pregnancy: 877w3d     Secondary diagnosis:  Active Problems:   Gestational hypertension   GBS (group B Streptococcus carrier), +RV culture, currently pregnant   Spontaneous vaginal delivery  Additional problems: none     Discharge diagnosis: Term Pregnancy Delivered                                                                                                Post partum procedures:none  Augmentation: AROM, Pitocin, Cytotec and Foley Balloon  Complications: None  Hospital course:  Induction of Labor With Vaginal Delivery   21 y.o. yo G2P1011 at 657w3d was admitted to the hospital 04/26/2017 for induction of labor.  Indication for induction: Gestational hypertension.  Patient had an uncomplicated labor course as follows: Membrane Rupture Time/Date: 1:00 PM ,04/27/2017   Intrapartum Procedures: Episiotomy: None [1]                                         Lacerations:  None [1]  Patient had delivery of a Viable infant.  Information for the patient's newborn:  Domenica Failerry, Boy Lumina [[id number]][[id number]]  Delivery Method: Vaginal, Spontaneous Delivery (Filed from Delivery Summary)   04/28/2017  Details of delivery can be found in separate delivery note.  Patient had a routine postpartum course. Patient is discharged home 04/30/17.  Physical exam  Vitals:   04/29/17 2100 04/30/17 0100 04/30/17 0422 04/30/17 0829  BP: (!) 141/74 (!) 145/83 (!) 149/95 124/70  Pulse: 80 87 (!) 111 100  Resp: 16 18 17 18   Temp: 98.4 F (36.9 C) 97.9 F (36.6 C) 97.7 F (36.5 C) 98.3 F (36.8 C)  TempSrc: Oral Oral Oral Oral  SpO2: 100% 100% 99% 100%  Weight:      Height:       General: alert, cooperative and no distress Lochia: appropriate Uterine Fundus: firm DVT Evaluation:  No evidence of DVT seen on physical exam. Negative Homan's sign. No cords or calf tenderness. Labs: Lab Results  Component Value Date   WBC 17.1 (H) 04/27/2017   HGB 9.8 (L) 04/27/2017   HCT 32.0 (L) 04/27/2017   MCV 72.6 (L) 04/27/2017   PLT 364 04/27/2017   CMP Latest Ref Rng & Units 04/23/2017  Glucose 65 - 99 mg/dL 84  BUN 6 - 20 mg/dL 7  Creatinine 7.820.44 - 9.561.00 mg/dL 2.130.54  Sodium 086135 - 578145 mmol/L 134(L)  Potassium 3.5 - 5.1 mmol/L 4.4  Chloride 101 - 111 mmol/L 104  CO2 22 - 32 mmol/L 24  Calcium 8.9 - 10.3 mg/dL 9.1  Total Protein 6.5 - 8.1 g/dL 7.2  Total Bilirubin 0.3 - 1.2 mg/dL 0.4  Alkaline Phos 38 - 126 U/L 143(H)  AST 15 - 41 U/L 16  ALT 14 - 54 U/L 13(L)  Discharge instruction: per After Visit Summary and "Baby and Me Booklet".  After visit meds:  Allergies as of 04/30/2017      Reactions   Hydrocodone Other (See Comments)   Other Reaction: SEVERE HA & GI Upset   Oxycodone Other (See Comments)   Other Reaction: SEVERE HA & GI Upset   Tape Rash      Medication List    TAKE these medications   ibuprofen 600 MG tablet Commonly known as:  ADVIL,MOTRIN Take 1 tablet (600 mg total) by mouth every 6 (six) hours.   prenatal multivitamin Tabs tablet Take 1 tablet by mouth daily at 12 noon.       Diet: routine diet  Activity: Advance as tolerated. Pelvic rest for 6 weeks.   Outpatient follow up:1 week Follow up Appt:Future Appointments Date Time Provider Department Center  05/15/2017 1:45 PM Anyanwu, Jethro Bastos, MD [facility]   Follow up Visit:No Follow-up on file.  Postpartum contraception: Progesterone only pills  Newborn Data: Live born female  Birth Weight: 7 lb 14.8 oz (3595 g) APGAR: 0, 3  Baby Feeding: Breast Disposition:NICU   04/30/2017 Levie Heritage, DO

## 2017-05-15 ENCOUNTER — Ambulatory Visit: Payer: Medicaid Other | Admitting: Obstetrics & Gynecology

## 2017-05-15 NOTE — Progress Notes (Deleted)
Post Partum Exam  Kimberly MainSamantha K Warmoth is a 21 y.o. 142P1011 female who presents for a postpartum visit. She is {1-10:13787} {time; units:18646} postpartum following a {delivery:12449}. I have fully reviewed the prenatal and intrapartum course. The delivery was at 37.3 gestational weeks.  Anesthesia: {anesthesia types:812}. Postpartum course has been ***. Baby's course has been ***. Baby is feeding by {breast/bottle:69}. Bleeding {vag bleed:12292}. Bowel function is {normal:32111}. Bladder function is {normal:32111}. Patient {is/is not:9024} sexually active. Contraception method is {contraceptive method:5051}. Postpartum depression screening:neg  {Common ambulatory SmartLinks:19316}  Review of Systems {ros; complete:30496}    Objective:  unknown if currently breastfeeding.  General:  {gen appearance:16600}   Breasts:  {breast exam:1202::"inspection negative, no nipple discharge or bleeding, no masses or nodularity palpable"}  Lungs: {lung exam:16931}  Heart:  {heart exam:5510}  Abdomen: {abdomen exam:16834}   Vulva:  {labia exam:12198}  Vagina: {vagina exam:12200}  Cervix:  {cervix exam:14595}  Corpus: {uterus exam:12215}  Adnexa:  {adnexa exam:12223}  Rectal Exam: {rectal/vaginal exam:12274}        Assessment:    *** postpartum exam. Pap smear {done:10129} at today's visit.   Plan:   1. Contraception: {method:5051} 2. *** 3. Follow up in: {1-10:13787} {time; units:19136} or as needed.

## 2017-05-30 ENCOUNTER — Telehealth: Payer: Self-pay | Admitting: Radiology

## 2017-05-30 NOTE — Telephone Encounter (Signed)
[facility], patient's phone has been disconnected or is no longer in service. No other number available in chart.

## 2017-08-09 ENCOUNTER — Telehealth (HOSPITAL_COMMUNITY): Payer: Self-pay | Admitting: Lactation Services

## 2017-08-09 NOTE — Telephone Encounter (Signed)
Lactation Services- return Telephone -  Mom called due to milk supply decreasing since she has been back to work  5 weeks ago. Per mom has been only pumping one breast at a time due to her dog chewing  Up the pump piece and she hasn't replaced it. Baby had been in NICU to start so the baby has never latched  And she has been exclusively pumping since the birth of the baby.  Initially was pump[ing off 6-8 oz a session and now it is 1-2 oz.  LC reviewed supply and demand and recommended in the next day of 2 to obtain the other pump piece so she can meet her  Pumping needs. Pumping at least 9 x's a day both breast , and not avoid staring at the bottles while pumping.  Consider power pumping once a day for 60 mins.  Also consider taking Mother Love Plus - with the 3 beneficial herbs to increase milk supply  Google Low milk supply for receipt for lactation cookies.  Work on the above plan and call back if needed.

## 2017-10-23 ENCOUNTER — Encounter: Payer: Self-pay | Admitting: Family Medicine

## 2017-10-23 ENCOUNTER — Ambulatory Visit: Payer: PRIVATE HEALTH INSURANCE | Admitting: Family Medicine

## 2017-10-23 VITALS — BP 120/88 | HR 104 | Temp 98.5°F | Resp 16 | Wt 261.0 lb

## 2017-10-23 DIAGNOSIS — Z8669 Personal history of other diseases of the nervous system and sense organs: Secondary | ICD-10-CM

## 2017-10-23 DIAGNOSIS — H1031 Unspecified acute conjunctivitis, right eye: Secondary | ICD-10-CM | POA: Diagnosis not present

## 2017-10-23 MED ORDER — ERYTHROMYCIN 5 MG/GM OP OINT: TOPICAL_OINTMENT | OPHTHALMIC | 0 refills | Status: DC

## 2017-10-23 NOTE — Progress Notes (Signed)
Subjective:     Patient ID: Kimberly Farmer, female   DOB: 06/07/1996, 22 y.o.   MRN: [id number] Chief Complaint  Patient presents with  . Eye Problem    Patient woke up with morning with red watery eye on her right side, patient states it felt like something was in her eye but was not able to see anything. Patient reports that she had blurred vision in eye and has swelling of lid and pressure in eye  . Migraine    Patient reports having a migraine headache that began yesterday she states that she has tried taking otc Tylenol for relief.    HPI Reports her young son is not ill at this time. She is breast feeding. States vision ok now and redness has spontaneously improved. No contact use. Headache improved. States she usually get two migraines a month with use of a cold rag, dark room, and sleep to resolve. No sure of the triggers.  Currently working at [employer]. Review of Systems     Objective:   Physical Exam  Constitutional: She appears well-developed and well-nourished. No distress.  Eyes: Pupils are equal, round, and reactive to light.  R eye with no f.b.visualized. Minimal sclera injection. V.A. Intact to # of fingers. No discharge.       Assessment:    1. Acute conjunctivitis of right eye, unspecified acute conjunctivitis type: will start erythromycin abx ointment if sx not improving in 24 hours.  2. History of migraine headaches    Plan:    Warm wet compresses today. Start abx ointment if not improving.

## 2017-10-23 NOTE — Patient Instructions (Signed)
Discusses use of frequent warm, wet compresses to her right eye. If eye matting or not improving in 24 hours start the eye antibiotic.

## 2018-03-04 NOTE — Progress Notes (Signed)
Needs pap - Had ovarian cyst last Saturday  Wants to discuss birth control options.

## 2018-03-05 ENCOUNTER — Other Ambulatory Visit (HOSPITAL_COMMUNITY)
Admission: RE | Admit: 2018-03-05 | Discharge: 2018-03-05 | Disposition: A | Discharge status: Home / Self Care | Payer: 59 | Source: Ambulatory Visit | Attending: Obstetrics & Gynecology | Admitting: Obstetrics & Gynecology

## 2018-03-05 ENCOUNTER — Ambulatory Visit (INDEPENDENT_AMBULATORY_CARE_PROVIDER_SITE_OTHER): Payer: 59 | Admitting: Obstetrics & Gynecology

## 2018-03-05 ENCOUNTER — Encounter: Payer: Self-pay | Admitting: Obstetrics & Gynecology

## 2018-03-05 VITALS — BP 128/88 | HR 89 | Ht 66.0 in | Wt 249.0 lb

## 2018-03-05 DIAGNOSIS — Z30011 Encounter for initial prescription of contraceptive pills: Secondary | ICD-10-CM

## 2018-03-05 DIAGNOSIS — Z8249 Family history of ischemic heart disease and other diseases of the circulatory system: Secondary | ICD-10-CM | POA: Diagnosis not present

## 2018-03-05 DIAGNOSIS — Z01419 Encounter for gynecological examination (general) (routine) without abnormal findings: Secondary | ICD-10-CM

## 2018-03-05 DIAGNOSIS — Z885 Allergy status to narcotic agent status: Secondary | ICD-10-CM | POA: Diagnosis not present

## 2018-03-05 DIAGNOSIS — E282 Polycystic ovarian syndrome: Secondary | ICD-10-CM | POA: Insufficient documentation

## 2018-03-05 DIAGNOSIS — Z3202 Encounter for pregnancy test, result negative: Secondary | ICD-10-CM | POA: Diagnosis not present

## 2018-03-05 DIAGNOSIS — Z87891 Personal history of nicotine dependence: Secondary | ICD-10-CM | POA: Insufficient documentation

## 2018-03-05 LAB — POCT URINE PREGNANCY: Preg Test, Ur: NEGATIVE

## 2018-03-05 MED ORDER — LEVONORGESTREL-ETHINYL ESTRAD 0.1-20 MG-MCG PO TABS: 1.0000 | ORAL_TABLET | Freq: Every day | ORAL | 11 refills | Status: DC

## 2018-03-05 MED ORDER — SPIRONOLACTONE 50 MG PO TABS: 50.0000 mg | ORAL_TABLET | Freq: Two times a day (BID) | ORAL | 2 refills | Status: DC

## 2018-03-05 NOTE — Patient Instructions (Signed)
Preventive Care 18-39 Years, Female Preventive care refers to lifestyle choices and visits with your health care provider that can promote health and wellness. What does preventive care include?  A yearly physical exam. This is also called an annual well check.  Dental exams once or twice a year.  Routine eye exams. Ask your health care provider how often you should have your eyes checked.  Personal lifestyle choices, including: ? Daily care of your teeth and gums. ? Regular physical activity. ? Eating a healthy diet. ? Avoiding tobacco and drug use. ? Limiting alcohol use. ? Practicing safe sex. ? Taking vitamin and mineral supplements as recommended by your health care provider. What happens during an annual well check? The services and screenings done by your health care provider during your annual well check will depend on your age, overall health, lifestyle risk factors, and family history of disease. Counseling Your health care provider may ask you questions about your:  Alcohol use.  Tobacco use.  Drug use.  Emotional well-being.  Home and relationship well-being.  Sexual activity.  Eating habits.  Work and work Statistician.  Method of birth control.  Menstrual cycle.  Pregnancy history.  Screening You may have the following tests or measurements:  Height, weight, and BMI.  Diabetes screening. This is done by checking your blood sugar (glucose) after you have not eaten for a while (fasting).  Blood pressure.  Lipid and cholesterol levels. These may be checked every 5 years starting at age 66.  Skin check.  Hepatitis C blood test.  Hepatitis B blood test.  Sexually transmitted disease (STD) testing.  BRCA-related cancer screening. This may be done if you have a family history of breast, ovarian, tubal, or peritoneal cancers.  Pelvic exam and Pap test. This may be done every 3 years starting at age 40. Starting at age 59, this may be done every 5  years if you have a Pap test in combination with an HPV test.  Discuss your test results, treatment options, and if necessary, the need for more tests with your health care provider. Vaccines Your health care provider may recommend certain vaccines, such as:  Influenza vaccine. This is recommended every year.  Tetanus, diphtheria, and acellular pertussis (Tdap, Td) vaccine. You may need a Td booster every 10 years.  Varicella vaccine. You may need this if you have not been vaccinated.  HPV vaccine. If you are 69 or younger, you may need three doses over 6 months.  Measles, mumps, and rubella (MMR) vaccine. You may need at least one dose of MMR. You may also need a second dose.  Pneumococcal 13-valent conjugate (PCV13) vaccine. You may need this if you have certain conditions and were not previously vaccinated.  Pneumococcal polysaccharide (PPSV23) vaccine. You may need one or two doses if you smoke cigarettes or if you have certain conditions.  Meningococcal vaccine. One dose is recommended if you are age 27-21 years and a first-year college student living in a residence hall, or if you have one of several medical conditions. You may also need additional booster doses.  Hepatitis A vaccine. You may need this if you have certain conditions or if you travel or work in places where you may be exposed to hepatitis A.  Hepatitis B vaccine. You may need this if you have certain conditions or if you travel or work in places where you may be exposed to hepatitis B.  Haemophilus influenzae type b (Hib) vaccine. You may need this if  you have certain risk factors.  Talk to your health care provider about which screenings and vaccines you need and how often you need them. This information is not intended to replace advice given to you by your health care provider. Make sure you discuss any questions you have with your health care provider. Document Released: 11/28/2001 Document Revised: 06/21/2016  Document Reviewed: 08/03/2015 Elsevier Interactive Patient Education  [employer].

## 2018-03-05 NOTE — Progress Notes (Signed)
GYNECOLOGY ANNUAL PREVENTATIVE CARE ENCOUNTER NOTE  Subjective:   Kimberly Farmer is a 22 y.o. G81P1011 female here for a routine annual gynecologic exam.  Current complaints: desires medication to help with PCOS related hirsutism, also wants to restart OCPs for contraception.   Denies abnormal vaginal bleeding, discharge, pelvic pain, problems with intercourse or other gynecologic concerns.    Gynecologic History Patient's last menstrual period was 01/08/2018. Contraception: none currently Has received HPV vaccine series  Obstetric History OB History  Gravida Para Term Preterm AB Living  0 1 1  SAB TAB Ectopic Multiple Live Births  1 0 0 0 1    # Outcome Date GA Lbr Len/2nd Weight Sex Delivery Anes PTL Lv  2 Term 04/28/17 [redacted]w[redacted]d 02:57 / 03:53 7 lb 14.8 oz (3.595 kg) M Vag-Spont EPI  LIV  1 SAB 10/16/14 [redacted]w[redacted]d    SAB       Past Medical History:  Diagnosis Date  . Arthritis    Bilateral knees and back  . Lumbar disc disease   . PCOS (polycystic ovarian syndrome)     Past Surgical History:  Procedure Laterality Date  . KNEE SURGERY Left 2011   Kimberly Farmer has had 5 surgeries on her knee  . TONSILLECTOMY AND ADENOIDECTOMY  2007    No current outpatient medications on file prior to visit.   No current facility-administered medications on file prior to visit.     Allergies  Allergen Reactions  . Hydrocodone Other (See Comments)    Other Reaction: SEVERE HA & GI Upset  . Oxycodone Other (See Comments)    Other Reaction: SEVERE HA & GI Upset  . Tape Rash    Social History   Socioeconomic History  . Marital status: Single    Spouse name: Not on file  . Number of children: Not on file  . Years of education: Not on file  . Highest education level: Not on file  Occupational History  . Not on file  Social Needs  . Financial resource strain: Not on file  . Food insecurity:    Worry: Not on file    Inability: Not on file  . Transportation needs:    Medical: Not  on file    Non-medical: Not on file  Tobacco Use  . Smoking status: Former Smoker    Types: E-cigarettes  . Smokeless tobacco: Former Neurosurgeon  . Tobacco comment: Vape - Reduced Nicotine  Substance and Sexual Activity  . Alcohol use: No  . Drug use: No  . Sexual activity: Yes    Partners: Male    Birth control/protection: None  Lifestyle  . Physical activity:    Days per week: Not on file    Minutes per session: Not on file  . Stress: Not on file  Relationships  . Social connections:    Talks on phone: Not on file    Gets together: Not on file    Attends religious service: Not on file    Active member of club or organization: Not on file    Attends meetings of clubs or organizations: Not on file    Relationship status: Not on file  . Intimate partner violence:    Fear of current or ex partner: Not on file    Emotionally abused: Not on file    Physically abused: Not on file    Forced sexual activity: Not on file  Other Topics Concern  . Not on file  Social History  Narrative  . Not on file    Family History  Problem Relation Age of Onset  . Hypertension Mother   . Alcohol abuse Father   . Diabetes Maternal Grandmother   . Hypertension Maternal Grandmother   . Cancer Paternal Grandfather        skin    The following portions of the patient's history were reviewed and updated as appropriate: allergies, current medications, past family history, past medical history, past social history, past surgical history and problem list.  Review of Systems Pertinent items noted in HPI and remainder of comprehensive ROS otherwise negative.   Objective:  BP 128/88   Pulse 89   Ht  (1.676 m)   Wt 249 lb (112.9 kg)   LMP 01/08/2018   Breastfeeding? No   BMI 40.19 kg/m  CONSTITUTIONAL: Well-developed, well-nourished female in no acute distress.  HENT:  Normocephalic, atraumatic, External right and left ear normal. Oropharynx is clear and moist EYES: Conjunctivae and EOM are  normal. Pupils are equal, round, and reactive to light. No scleral icterus.  NECK: Normal range of motion, supple, no masses.  Normal thyroid.  SKIN: Skin is warm and dry. No rash noted. Not diaphoretic. No erythema. No pallor. NEUROLOGIC: Alert and oriented to person, place, and time. Normal reflexes, muscle tone coordination. No cranial nerve deficit noted. PSYCHIATRIC: Normal mood and affect. Normal behavior. Normal judgment and thought content. CARDIOVASCULAR: Normal heart rate noted, regular rhythm RESPIRATORY: Clear to auscultation bilaterally. Effort and breath sounds normal, no problems with respiration noted. BREASTS: Symmetric in size. No masses, skin changes, nipple drainage, or lymphadenopathy. ABDOMEN: Soft, normal bowel sounds, no distention noted.  No tenderness, rebound or guarding.  PELVIC: Normal appearing external genitalia; normal appearing vaginal mucosa and cervix.  No abnormal discharge noted.  Pap smear obtained.  Normal uterine size, no other palpable masses, no uterine or adnexal tenderness. MUSCULOSKELETAL: Normal range of motion. No tenderness.  No cyanosis, clubbing, or edema.  2+ distal pulses.   Assessment and Plan:  1. Well woman exam with routine gynecological exam Will follow up results of pap smear and manage accordingly. - Cytology - PAP  - Cervicovaginal ancillary only  2. Oral contraception initiation The use of the oral contraceptive has been fully discussed with the patient. This includes the proper method to initiate (same day start recommended) and continue the pills, the need for regular compliance to ensure adequate contraceptive effect, the physiology which make the pill effective, the instructions for what to do in event of a missed pill, and warnings about anticipated minor side effects such as breakthrough spotting, nausea, breast tenderness, weight changes, acne, headaches, etc.  She has been told of the more serious potential side effects such as  MI, stroke, and deep vein thrombosis, all of which are very unlikely.  She has been asked to report any signs of such serious problems immediately.  Backup contraception was recommended for first two weeks. The need for additional protection, such as a condom, to prevent exposure to sexually transmitted diseases has also been discussed- the patient has been clearly reminded that OCP's cannot protect her against diseases such as HIV and others. She understands and wishes to take the medication as prescribed.She was given a prescription for Aviane (has tolerated this in past) and will return in 2 months for BP and contraceptive check. - POCT urine pregnancy today was negative - levonorgestrel-ethinyl estradiol (AVIANE) 0.1-20 MG-MCG tablet; Take 1 tablet by mouth daily.  Dispense: 1 Package; Refill:  11  3. PCOS (polycystic ovarian syndrome) Spironolactone prescribed, cautioned about possible hypokalemia and encouraged to eat potassium-rich foods. Will check BMET at next visit.  - spironolactone (ALDACTONE) 50 MG tablet; Take 1 tablet (50 mg total) by mouth 2 (two) times daily. Will start with 50 mg twice daily, and increase to 100 mg twice daily as needed  Dispense: 60 tablet; Refill: 2  Routine preventative health maintenance measures emphasized. Please refer to After Visit Summary for other counseling recommendations.    Jaynie Collins, MD, FACOG Obstetrician & Gynecologist, [facility] for [employer], [facility]

## 2018-03-06 LAB — CYTOLOGY - PAP: Diagnosis: NEGATIVE

## 2018-03-06 LAB — CERVICOVAGINAL ANCILLARY ONLY
Chlamydia: NEGATIVE
Neisseria Gonorrhea: NEGATIVE

## 2018-05-02 ENCOUNTER — Encounter: Payer: Self-pay | Admitting: Obstetrics & Gynecology

## 2018-05-02 ENCOUNTER — Ambulatory Visit (INDEPENDENT_AMBULATORY_CARE_PROVIDER_SITE_OTHER): Payer: 59 | Admitting: Obstetrics & Gynecology

## 2018-05-02 VITALS — BP 126/83 | HR 72 | Wt 248.0 lb

## 2018-05-02 DIAGNOSIS — Z3041 Encounter for surveillance of contraceptive pills: Secondary | ICD-10-CM

## 2018-05-02 DIAGNOSIS — E282 Polycystic ovarian syndrome: Secondary | ICD-10-CM | POA: Diagnosis not present

## 2018-05-02 MED ORDER — DROSPIRENONE-ETHINYL ESTRADIOL 3-0.03 MG PO TABS: 1.0000 | ORAL_TABLET | Freq: Every day | ORAL | 11 refills | Status: DC

## 2018-05-02 NOTE — Progress Notes (Signed)
   GYNECOLOGY OFFICE VISIT NOTE  History:  22 y.o. G2P1011 here today for OCP check.  Was prescribed Aviane.  Reports significant breakthrough bleeding and cramping. Wants to try another type of OCP. She denies any other concerns.   Past Medical History:  Diagnosis Date  . Arthritis    Bilateral knees and back  . Lumbar disc disease   . PCOS (polycystic ovarian syndrome)     Past Surgical History:  Procedure Laterality Date  . KNEE SURGERY Left 2011   [name] has had 5 surgeries on her knee  . TONSILLECTOMY AND ADENOIDECTOMY  2007    The following portions of the patient's history were reviewed and updated as appropriate: allergies, current medications, past family history, past medical history, past social history, past surgical history and problem list.   Health Maintenance:  Normal pap on 03/05/2018.    Review of Systems:  Pertinent items noted in HPI and remainder of comprehensive ROS otherwise negative.  Objective:  Physical Exam BP 126/83   Pulse 72   Wt 248 lb (112.5 kg)   BMI 40.03 kg/m  CONSTITUTIONAL: Well-developed, well-nourished female in no acute distress.  MUSCULOSKELETAL: Normal range of motion. No edema noted. NEUROLOGIC: Alert and oriented to person, place, and time. Normal muscle tone coordination. No cranial nerve deficit noted. PSYCHIATRIC: Normal mood and affect. Normal behavior. Normal judgment and thought content. CARDIOVASCULAR: Normal heart rate noted RESPIRATORY: Effort and breath sounds normal, no problems with respiration noted ABDOMEN: Soft, no distention noted.   PELVIC: Deferred   Assessment & Plan:  1. Oral contraceptive pill surveillance 2. PCOS (polycystic ovarian syndrome) Will go from the low dose Aviane to Zarah (also has PCOS associated hirsutism and acne).  Will monitor response. - drospirenone-ethinyl estradiol (YASMIN,ZARAH,SYEDA) 3-0.03 MG tablet; Take 1 tablet by mouth daily.  Dispense: 1 Package; Refill: 11  Return in about 2  months (around 07/03/2018) for OCP Reevaluation.   Total face-to-face time with patient: 10 minutes.  Over 50% of encounter was spent on counseling and coordination of care.   [name]  Yariel Ferraris, MD, FACOG Obstetrician & Gynecologist, [facility] [employer], [facility]

## 2018-05-16 ENCOUNTER — Ambulatory Visit (INDEPENDENT_AMBULATORY_CARE_PROVIDER_SITE_OTHER): Payer: 59 | Admitting: Obstetrics and Gynecology

## 2018-05-16 ENCOUNTER — Encounter: Payer: Self-pay | Admitting: Obstetrics and Gynecology

## 2018-05-16 ENCOUNTER — Ambulatory Visit: Payer: 59 | Admitting: Physician Assistant

## 2018-05-16 ENCOUNTER — Encounter: Payer: Self-pay | Admitting: Physician Assistant

## 2018-05-16 VITALS — BP 122/80 | HR 90 | Temp 98.5°F | Resp 16 | Wt 235.0 lb

## 2018-05-16 VITALS — BP 120/76 | HR 83 | Ht 66.0 in | Wt 233.0 lb

## 2018-05-16 DIAGNOSIS — N921 Excessive and frequent menstruation with irregular cycle: Secondary | ICD-10-CM | POA: Insufficient documentation

## 2018-05-16 DIAGNOSIS — Z3041 Encounter for surveillance of contraceptive pills: Secondary | ICD-10-CM | POA: Diagnosis not present

## 2018-05-16 DIAGNOSIS — G43109 Migraine with aura, not intractable, without status migrainosus: Secondary | ICD-10-CM

## 2018-05-16 HISTORY — DX: Excessive and frequent menstruation with irregular cycle: N92.1

## 2018-05-16 MED ORDER — TOPIRAMATE 50 MG PO TABS: ORAL_TABLET | ORAL | 0 refills | Status: DC

## 2018-05-16 NOTE — Patient Instructions (Signed)
Excedrin migraine for breakthrough migraine MigraineBuddy app for tracking  Migraine Headache A migraine headache is a very strong throbbing pain on one side or both sides of your head. Migraines can also cause other symptoms. Talk with your doctor about what things may bring on (trigger) your migraine headaches. Follow these instructions at home: Medicines  Take over-the-counter and prescription medicines only as told by your doctor.  Do not drive or use heavy machinery while taking prescription pain medicine.  To prevent or treat constipation while you are taking prescription pain medicine, your doctor may recommend that you: ? Drink enough fluid to keep your pee (urine) clear or pale yellow. ? Take over-the-counter or prescription medicines. ? Eat foods that are high in fiber. These include fresh fruits and vegetables, whole grains, and beans. ? Limit foods that are high in fat and processed sugars. These include fried and sweet foods. Lifestyle  Avoid alcohol.  Do not use any products that contain nicotine or tobacco, such as cigarettes and e-cigarettes. If you need help quitting, ask your doctor.  Get at least 8 hours of sleep every night.  Limit your stress. General instructions   Keep a journal to find out what may bring on your migraines. For example, write down: ? What you eat and drink. ? How much sleep you get. ? Any change in what you eat or drink. ? Any change in your medicines.  If you have a migraine: ? Avoid things that make your symptoms worse, such as bright lights. ? It may help to lie down in a dark, quiet room. ? Do not drive or use heavy machinery. ? Ask your doctor what activities are safe for you.  Keep all follow-up visits as told by your doctor. This is important. Contact a doctor if:  You get a migraine that is different or worse than your usual migraines. Get help right away if:  Your migraine gets very bad.  You have a fever.  You have a  stiff neck.  You have trouble seeing.  Your muscles feel weak or like you cannot control them.  You start to lose your balance a lot.  You start to have trouble walking.  You pass out (faint). This information is not intended to replace advice given to you by your health care provider. Make sure you discuss any questions you have with your health care provider. Document Released: 07/11/2008 Document Revised: 04/21/2016 Document Reviewed: 03/20/2016 Elsevier Interactive Patient Education  2018 [employer]

## 2018-05-16 NOTE — Progress Notes (Signed)
Patient: Kimberly Farmer Female    DOB: 12/08/1995   21 y.o.   MRN: [id number] Visit Date: 05/16/2018  Today's Provider: Margaretann LovelessJennifer M Burnette, PA-C   Chief Complaint  Patient presents with  . Headache   Subjective:    Headache   This is a new problem. The current episode started more than 1 year ago. The problem occurs intermittently (2-3 times week she gets headache). The problem has been unchanged. The pain is located in the frontal and temporal region. The pain does not radiate. The quality of the pain is described as dull and throbbing. The pain is at a severity of 6/10. The pain is moderate. Associated symptoms include blurred vision, photophobia and a visual change. Pertinent negatives include no eye pain, facial sweating, nausea or scalp tenderness. The symptoms are aggravated by bright light, fatigue and noise. She has tried NSAIDs (IBU and Bc powders) for the symptoms. The treatment provided mild relief. Her past medical history is significant for migraine headaches.      Allergies  Allergen Reactions  . Hydrocodone Other (See Comments)    Other Reaction: SEVERE HA & GI Upset  . Oxycodone Other (See Comments)    Other Reaction: SEVERE HA & GI Upset  . Tape Rash     Current Outpatient Medications:  .  drospirenone-ethinyl estradiol (YASMIN,ZARAH,SYEDA) 3-0.03 MG tablet, Take 1 tablet by mouth daily., Disp: 1 Package, Rfl: 11  Review of Systems  Constitutional: Positive for fatigue.  Eyes: Positive for blurred vision and photophobia. Negative for pain.  Respiratory: Negative.   Cardiovascular: Negative.   Gastrointestinal: Negative for nausea.  Neurological: Positive for headaches.  Psychiatric/Behavioral: Positive for decreased concentration.    Social History   Tobacco Use  . Smoking status: Former Smoker    Types: E-cigarettes  . Smokeless tobacco: Former NeurosurgeonUser  . Tobacco comment: Vape - Reduced Nicotine  Substance Use Topics  . Alcohol use: No    Objective:   BP 122/80 (BP Location: Right Arm, Patient Position: Sitting, Cuff Size: Normal)   Pulse 90   Temp 98.5 F (36.9 C) (Oral)   Resp 16   Wt 235 lb (106.6 kg)   LMP 04/11/2018   SpO2 97%   BMI 37.93 kg/m  Vitals:   05/16/18 0923  BP: 122/80  Pulse: 90  Resp: 16  Temp: 98.5 F (36.9 C)  TempSrc: Oral  SpO2: 97%  Weight: 235 lb (106.6 kg)     Physical Exam  Constitutional: She is oriented to person, place, and time. She appears well-developed and well-nourished. No distress.  Neck: Normal range of motion. Neck supple. No JVD present. No tracheal deviation present. No thyromegaly present.  Cardiovascular: Normal rate, regular rhythm and normal heart sounds. Exam reveals no gallop and no friction rub.  No murmur heard. Pulmonary/Chest: Effort normal and breath sounds normal. No respiratory distress. She has no wheezes. She has no rales.  Lymphadenopathy:    She has no cervical adenopathy.  Neurological: She is alert and oriented to person, place, and time. She has normal strength. She is not disoriented. No cranial nerve deficit or sensory deficit. She displays a negative Romberg sign. Coordination and gait normal.  Skin: She is not diaphoretic.  Psychiatric: She has a normal mood and affect. Her behavior is normal. Her mood appears not anxious. She is not agitated.  Vitals reviewed.      Assessment & Plan:     1. 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 She is to try Excedrin Migraine for any breakthrough migraines. Discussed using MigraineBuddy app to track migraines. I will see her back in 4 weeks. Call if no improvements or symptoms worsen.  - topiramate (TOPAMAX) 50 MG tablet; Start with one tab PO daily x one week then increase to one tab PO BID  Dispense: 60 tablet; Refill: 0       Margaretann Loveless, PA-C  [facility]

## 2018-05-16 NOTE — Progress Notes (Signed)
Margaretann LovelessBurnette, Jennifer M, PA-C   Chief Complaint  Patient presents with  . Dysmenorrhea    Severe Pain with periods, goes through tampon and bad in 20 mins, with big blood clots     HPI:      Ms. Annie MainSamantha K Baise is a 22 y.o. G2P1011 who LMP was Patient's last menstrual period was 05/12/2018 (exact date)., [facility]. Hx of PCOS with menses Q3-4 months, very heavy with clots and dysmen. Pt had annual 5/19 with Dr. [name] Anyanwu and was started on aviane for cycle control/hirsutism sx. Pt then went back 7/19  because she was having BTB doing cont dosing of OCPs, and was changed to yasmin. Pt still taking yasmin and hasn't done placebo wk yet. Still bleeding. Pt had normal pap/neg STD testing 5/19.     Past Medical History:  Diagnosis Date  . Arthritis    Bilateral knees and back  . Lumbar disc disease   . PCOS (polycystic ovarian syndrome)     Past Surgical History:  Procedure Laterality Date  . KNEE SURGERY Left 2011   [name] has had 5 surgeries on her knee  . TONSILLECTOMY AND ADENOIDECTOMY  2007    Family History  Problem Relation Age of Onset  . Hypertension Mother   . Alcohol abuse Father   . Diabetes Maternal Grandmother   . Hypertension Maternal Grandmother   . Cancer Paternal Grandfather        skin    Social History   Socioeconomic History  . Marital status: Single    Spouse name: Not on file  . Number of children: Not on file  . Years of education: Not on file  . Highest education level: Not on file  Occupational History  . Not on file  Social Needs  . Financial resource strain: Not on file  . Food insecurity:    Worry: Not on file    Inability: Not on file  . Transportation needs:    Medical: Not on file    Non-medical: Not on file  Tobacco Use  . Smoking status: Former Smoker    Types: E-cigarettes  . Smokeless tobacco: Former NeurosurgeonUser  . Tobacco comment: Vape - Reduced Nicotine  Substance and Sexual Activity  . Alcohol use: No  .  Drug use: No  . Sexual activity: Not Currently    Partners: Male    Birth control/protection: Pill  Lifestyle  . Physical activity:    Days per week: Not on file    Minutes per session: Not on file  . Stress: Not on file  Relationships  . Social connections:    Talks on phone: Not on file    Gets together: Not on file    Attends religious service: Not on file    Active member of club or organization: Not on file    Attends meetings of clubs or organizations: Not on file    Relationship status: Not on file  . Intimate partner violence:    Fear of current or ex partner: Not on file    Emotionally abused: Not on file    Physically abused: Not on file    Forced sexual activity: Not on file  Other Topics Concern  . Not on file  Social History Narrative  . Not on file    Outpatient Medications Prior to Visit  Medication Sig Dispense Refill  . drospirenone-ethinyl estradiol (YASMIN,ZARAH,SYEDA) 3-0.03 MG tablet Take 1 tablet by mouth daily. 1 Package 11  .  topiramate (TOPAMAX) 50 MG tablet Start with one tab PO daily x one week then increase to one tab PO BID 60 tablet 0   No facility-administered medications prior to visit.       ROS:  Review of Systems  Constitutional: Positive for fatigue. Negative for fever and unexpected weight change.  Respiratory: Negative for cough, shortness of breath and wheezing.   Cardiovascular: Negative for chest pain, palpitations and leg swelling.  Gastrointestinal: Negative for blood in stool, constipation, diarrhea, nausea and vomiting.  Endocrine: Negative for cold intolerance, heat intolerance and polyuria.  Genitourinary: Positive for menstrual problem. Negative for dyspareunia, dysuria, flank pain, frequency, genital sores, hematuria, pelvic pain, urgency, vaginal bleeding, vaginal discharge and vaginal pain.  Musculoskeletal: Negative for back pain, joint swelling and myalgias.  Skin: Negative for rash.  Neurological: Positive for  headaches. Negative for dizziness, syncope, light-headedness and numbness.  Hematological: Negative for adenopathy.  Psychiatric/Behavioral: Positive for dysphoric mood. Negative for agitation, confusion, sleep disturbance and suicidal ideas. The patient is not nervous/anxious.     OBJECTIVE:   Vitals:  BP 120/76   Pulse 83   Ht 5\' 6"  (1.676 m)   Wt 233 lb (105.7 kg)   LMP 05/12/2018 (Exact Date)   Breastfeeding? No   BMI 37.61 kg/m   Physical Exam  Constitutional: She is oriented to person, place, and time. She appears well-developed.  Neck: Normal range of motion.  Pulmonary/Chest: Effort normal.  Musculoskeletal: Normal range of motion.  Neurological: She is alert and oriented to person, place, and time. No cranial nerve deficit.  Psychiatric: She has a normal mood and affect. Her behavior is normal. Judgment and thought content normal.  Vitals reviewed.   Assessment/Plan: Breakthrough bleeding on birth control pills - Since starting OCPs 5/19, doing cont dosing. Suggested she do placebo wk and start over on active pills. Has f/u with GYN 9/19.   Encounter for surveillance of contraceptive pills    Return if symptoms worsen or fail to improve.  Britiney Blahnik B. Jeb Schloemer, PA-C 05/16/2018 4:52 PM

## 2018-05-16 NOTE — Patient Instructions (Signed)
I value your feedback and entrusting us with your care. If you get a [facility] patient survey, I would appreciate you taking the time to let us know about your experience today. Thank you! 

## 2018-06-10 ENCOUNTER — Encounter: Payer: Self-pay | Admitting: Physician Assistant

## 2018-06-10 ENCOUNTER — Ambulatory Visit (INDEPENDENT_AMBULATORY_CARE_PROVIDER_SITE_OTHER): Payer: 59 | Admitting: Physician Assistant

## 2018-06-10 ENCOUNTER — Other Ambulatory Visit: Payer: Self-pay | Admitting: Physician Assistant

## 2018-06-10 VITALS — BP 128/80 | HR 110 | Temp 98.5°F | Resp 16 | Wt 223.0 lb

## 2018-06-10 DIAGNOSIS — M5030 Other cervical disc degeneration, unspecified cervical region: Secondary | ICD-10-CM

## 2018-06-10 DIAGNOSIS — M5020 Other cervical disc displacement, unspecified cervical region: Secondary | ICD-10-CM | POA: Diagnosis not present

## 2018-06-10 DIAGNOSIS — M5137 Other intervertebral disc degeneration, lumbosacral region: Secondary | ICD-10-CM | POA: Diagnosis not present

## 2018-06-10 DIAGNOSIS — M62830 Muscle spasm of back: Secondary | ICD-10-CM

## 2018-06-10 MED ORDER — METHYLPREDNISOLONE 4 MG PO TBPK: ORAL_TABLET | ORAL | 0 refills | Status: DC

## 2018-06-10 MED ORDER — PREDNISONE 10 MG (21) PO TBPK: ORAL_TABLET | ORAL | 0 refills | Status: DC

## 2018-06-10 MED ORDER — CYCLOBENZAPRINE HCL 5 MG PO TABS: 5.0000 mg | ORAL_TABLET | Freq: Three times a day (TID) | ORAL | 1 refills | Status: DC | PRN

## 2018-06-10 NOTE — Progress Notes (Signed)
Patient: Kimberly Farmer Female    DOB: 1996/05/24   22 y.o.   MRN: [id number] Visit Date: 06/10/2018  Today's Provider: Margaretann Loveless, PA-C   Chief Complaint  Patient presents with  . Back Pain   Subjective:    Back Pain  This is a chronic (She usually sees [facility] for this.) problem. The problem has been gradually worsening since onset. The quality of the pain is described as aching and stabbing ("Pressure Pain"). The pain does not radiate. The pain is the same all the time. The symptoms are aggravated by bending, sitting and position. Pertinent negatives include no numbness, tingling or weakness. She has tried nothing (Reports that she was going to start some injections at [facility] but she got pregnant. She has not followed with them.Marland Kitchen) for the symptoms.   Patient states that on Saturday she went to bend down to pick up her son. Before she could even pick him up she felt a sharp, stabbing pain in the left side of her low back that radiated across midline. She was unable to stand up and ending up falling over into a door frame. She reports she went and took some IBU and started using a heating pad. She took it easy all day yesterday hoping to get better by work today. Then this morning when she awoke symptoms were worse. Very stiff, sharp pains. No sciatica or radiating symptoms down legs. Has been seen by [facility] in 2017 for similar symptoms. Was found to have DDD in L3-L4, L4-L5, and L5-S1. There was also noted to have moderate disc protrusions at L4-L5 with some mild-mod central canal stenosis. This was from an MRI done on 05/02/16 (report attached below). No loss of bowel or bladder control.     Allergies  Allergen Reactions  . Hydrocodone Other (See Comments)    Other Reaction: SEVERE HA & GI Upset  . Oxycodone Other (See Comments)    Other Reaction: SEVERE HA & GI Upset  . Tape Rash     Current Outpatient Medications:  .  drospirenone-ethinyl estradiol  (YASMIN,ZARAH,SYEDA) 3-0.03 MG tablet, Take 1 tablet by mouth daily., Disp: 1 Package, Rfl: 11 .  topiramate (TOPAMAX) 50 MG tablet, Start with one tab PO daily x one week then increase to one tab PO BID, Disp: 60 tablet, Rfl: 0  Review of Systems  Constitutional: Negative.   Respiratory: Negative.   Cardiovascular: Negative.   Gastrointestinal: Negative.   Musculoskeletal: Positive for back pain and myalgias.  Neurological: Negative for tingling, weakness and numbness.    Social History   Tobacco Use  . Smoking status: Former Smoker    Types: E-cigarettes  . Smokeless tobacco: Former Neurosurgeon  . Tobacco comment: Vape - Reduced Nicotine  Substance Use Topics  . Alcohol use: No   Objective:   BP 128/80 (BP Location: Left Arm, Patient Position: Sitting, Cuff Size: Normal)   Pulse (!) 110   Temp 98.5 F (36.9 C) (Oral)   Resp 16   Wt 223 lb (101.2 kg)   LMP 05/12/2018 (Exact Date)   SpO2 97%   BMI 35.99 kg/m  Vitals:   06/10/18 1047  BP: 128/80  Pulse: (!) 110  Resp: 16  Temp: 98.5 F (36.9 C)  TempSrc: Oral  SpO2: 97%  Weight: 223 lb (101.2 kg)     Physical Exam  Constitutional: She appears well-developed and well-nourished. No distress.  Neck: Normal range of motion. Neck supple.  Cardiovascular:  Normal rate, regular rhythm and normal heart sounds. Exam reveals no gallop and no friction rub.  No murmur heard. Pulmonary/Chest: Effort normal and breath sounds normal. No respiratory distress. She has no wheezes. She has no rales.  Musculoskeletal:       Lumbar back: She exhibits decreased range of motion (back flexion), tenderness, bony tenderness and spasm.       Back:  Skin: She is not diaphoretic.  Vitals reviewed.   MRI LUMBAR SPINE WITHOUT CONTRAST  Comparison: Radiographs 02/17/2016  Indication: LOW BACK PAIN, >6 WKS / RED FLAG(S) / RADICULOPATHY, Midline low back pain without radiation to LEs, M54.5 Low back pain, G89.29 Other chronic pain. Severe low  back pain.  Technique: Multiplanar MR sequences were performed of the lumbar spine without IV contrast.  Findings: The conus is normal in appearance and position.Alignment of the lumbar spine is normal.Bone marrow signal is normal for age.   L1-L2: No disc protrusion, canal stenosis, or foraminal stenosis.   L2-L3: No disc protrusion, canal stenosis, or foraminal stenosis.   L3-L4: Mild disc desiccation. Minimal broad-based disc bulge with superimposed small posterior disc protrusion. No canal or foraminal stenosis.  L4-L5: Mild disc desiccation. Small broad-based disc bulge with superimposed large central disc protrusion which appears to abut or have mild mass effect on the descending right L5 nerve roots. Together with mild bilateral facet hypertrophy, this results in mild-moderate narrowing of the central canal and moderate narrowing of the right lateral recess. No neural foraminal narrowing  L5-S1: Moderate disc desiccation with minimal broad-based disc bulge. Mild bilateral facet hypertrophy. No canal or foraminal stenosis.  Sacroiliac joints: Normal.  Soft tissues: Incidental note is made of a 2.4 cm T2 bright cyst in the left ovary. Tubular T2 bright structures within the pelvis (series 9B, image 62) appear to represent bowel.  Impression: 1. Multilevel degenerative disc and facet disease in the lower lumbar spine, greatest at L4-L5 where a large central disc protrusion results in mild-moderate central canal stenosis and moderate narrowing of the right lateral recess. The disc protrusion appears to abut or have mild mass effect on the descending right L5 nerve roots. 2. Incidental 2.4 cm left ovarian cyst.  Electronically Signed ZO:XWRUEA Riofrio, MD Electronically Signed on:05/02/2016 10:12 AM    Assessment & Plan:     1. DDD (degenerative disc disease), lumbosacral Will treat acute exacerbation with medrol dose pak and muscle relaxer. Continue using  moist heating pad on area. Back exercises and stretches printed for patient. If symptoms fail to improve or worsen she is to call the office and we can facilitate referral back to the [facility] She is in agreement with plan. - methylPREDNISolone (MEDROL) 4 MG TBPK tablet; 6 day taper; take as directed on package instructions  Dispense: 21 tablet; Refill: 0 - cyclobenzaprine (FLEXERIL) 5 MG tablet; Take 1 tablet (5 mg total) by mouth 3 (three) times daily as needed for muscle spasms.  Dispense: 30 tablet; Refill: 1  2. 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 - methylPREDNISolone (MEDROL) 4 MG TBPK tablet; 6 day taper; take as directed on package instructions  Dispense: 21 tablet; Refill: 0 - cyclobenzaprine (FLEXERIL) 5 MG tablet; Take 1 tablet (5 mg total) by mouth 3 (three) times daily as needed for muscle spasms.  Dispense: 30 tablet; Refill: 1  3. Muscle spasm of back See above medical treatment plan. - methylPREDNISolone (MEDROL) 4 MG TBPK tablet; 6 day taper; take as directed on package  instructions  Dispense: 21 tablet; Refill: 0 - cyclobenzaprine (FLEXERIL) 5 MG tablet; Take 1 tablet (5 mg total) by mouth 3 (three) times daily as needed for muscle spasms.  Dispense: 30 tablet; Refill: 1       Margaretann LovelessJennifer M Burnette, PA-C  [facility]

## 2018-06-10 NOTE — Telephone Encounter (Signed)
Changed due to medrol dose pak on backprder

## 2018-06-10 NOTE — Patient Instructions (Signed)

## 2018-06-12 ENCOUNTER — Other Ambulatory Visit: Payer: Self-pay | Admitting: Physician Assistant

## 2018-06-12 DIAGNOSIS — G43109 Migraine with aura, not intractable, without status migrainosus: Secondary | ICD-10-CM

## 2018-06-13 ENCOUNTER — Telehealth: Payer: Self-pay | Admitting: Physician Assistant

## 2018-06-13 ENCOUNTER — Ambulatory Visit: Payer: Self-pay | Admitting: Physician Assistant

## 2018-06-13 NOTE — Telephone Encounter (Signed)
[facility] Top[facility] 50MG .  States she has questions regarding the medication.

## 2018-06-13 NOTE — Telephone Encounter (Signed)
Called patient to get more detail but no answer.

## 2018-06-14 ENCOUNTER — Ambulatory Visit (INDEPENDENT_AMBULATORY_CARE_PROVIDER_SITE_OTHER): Payer: 59

## 2018-06-14 VITALS — BP 124/81 | HR 106 | Resp 20 | Ht 66.0 in | Wt 225.4 lb

## 2018-06-14 DIAGNOSIS — Z3201 Encounter for pregnancy test, result positive: Secondary | ICD-10-CM

## 2018-06-14 DIAGNOSIS — Z3200 Encounter for pregnancy test, result unknown: Secondary | ICD-10-CM

## 2018-06-14 LAB — POCT URINE PREGNANCY: Preg Test, Ur: POSITIVE — AB

## 2018-06-14 NOTE — Telephone Encounter (Signed)
Tried calling; pt's voicemail is not set up.   Thanks,   -Laura  

## 2018-06-14 NOTE — Telephone Encounter (Signed)
Patient advised as directed below.  Thanks,  -Jalayia Bagheri 

## 2018-06-14 NOTE — Progress Notes (Signed)
[name] presents today for UPT. She has no unusual complaints. Patient reports she discontinued taking Cyclobenzaprine (Flexeril), Topiramate (Topamax) and OCP once she confirmed pregnancy at home.   LMP: 05/12/18  EDD: 02/16/2019    OBJECTIVE: Appears well, in no apparent distress.  OB History    Gravida  2   Para  1   Term  1   Preterm  0   AB  1   Living  1     SAB  1   TAB  0   Ectopic  0   Multiple  0   Live Births  1          Home UPT Result: 06/13/18  Positive In-Office UPT result: Positive   I have reviewed the patient's medical, obstetrical, social, and family histories, and medications.   ASSESSMENT: Positive pregnancy test  PLAN:  [facility] Advised patient to schedule Initial OB visit for 10-12 weeks. Begin Prenatal vitamins. Re emphasized to discontinue taking prescribed medications.  Advised if begins having shortness of breath, chest pains, acute heavy bleeding to go to the nearest hospital or [facility] to be evaluated and treated accordingly.

## 2018-06-14 NOTE — Telephone Encounter (Signed)
Pt returned call. Please advised. Thanks TNP

## 2018-06-14 NOTE — Telephone Encounter (Signed)
Yes it is ok to stop Topamax  Bacigalupo, Marzella SchleinAngela M, MD, MPH [facility] 06/14/2018 11:45 AM

## 2018-06-14 NOTE — Telephone Encounter (Signed)
Patient calling wanting to know if she can stop the Topiramate because she is [redacted] weeks pregnant. Please advise.  FYI: Patient was seen by Verdis FredericksonMorehead,[facility] today and reported not taking her medications and was re emphasized to discontinue taking prescribed medications. So in reality I don't know why she called today.

## 2018-06-18 ENCOUNTER — Other Ambulatory Visit: Payer: Self-pay

## 2018-06-18 ENCOUNTER — Emergency Department: Payer: 59

## 2018-06-18 ENCOUNTER — Emergency Department
Admission: EM | Admit: 2018-06-18 | Discharge: 2018-06-18 | Disposition: A | Discharge status: Home / Self Care | Payer: 59 | Attending: Emergency Medicine | Admitting: Emergency Medicine

## 2018-06-18 DIAGNOSIS — Z3A01 Less than 8 weeks gestation of pregnancy: Secondary | ICD-10-CM | POA: Insufficient documentation

## 2018-06-18 DIAGNOSIS — Z87891 Personal history of nicotine dependence: Secondary | ICD-10-CM | POA: Insufficient documentation

## 2018-06-18 DIAGNOSIS — O200 Threatened abortion: Secondary | ICD-10-CM | POA: Insufficient documentation

## 2018-06-18 DIAGNOSIS — O26851 Spotting complicating pregnancy, first trimester: Secondary | ICD-10-CM

## 2018-06-18 DIAGNOSIS — O26891 Other specified pregnancy related conditions, first trimester: Secondary | ICD-10-CM

## 2018-06-18 DIAGNOSIS — R102 Pelvic and perineal pain: Secondary | ICD-10-CM | POA: Diagnosis not present

## 2018-06-18 DIAGNOSIS — O9989 Other specified diseases and conditions complicating pregnancy, childbirth and the puerperium: Secondary | ICD-10-CM | POA: Diagnosis present

## 2018-06-18 LAB — COMPREHENSIVE METABOLIC PANEL
ALT: 21 U/L (ref 0–44)
AST: 18 U/L (ref 15–41)
Albumin: 4.1 g/dL (ref 3.5–5.0)
Alkaline Phosphatase: 54 U/L (ref 38–126)
Anion gap: 7 (ref 5–15)
BUN: 5 mg/dL — ABNORMAL LOW (ref 6–20)
CO2: 25 mmol/L (ref 22–32)
Calcium: 9.1 mg/dL (ref 8.9–10.3)
Chloride: 107 mmol/L (ref 98–111)
Creatinine, Ser: 0.49 mg/dL (ref 0.44–1.00)
GFR calc Af Amer: >60 mL/min (ref >60)
GFR calc non Af Amer: >60 mL/min (ref >60)
Glucose, Bld: 84 mg/dL (ref 70–99)
Potassium: 3.5 mmol/L (ref 3.5–5.1)
Sodium: 139 mmol/L (ref 135–145)
Total Bilirubin: 0.5 mg/dL (ref 0.3–1.2)
Total Protein: 7.1 g/dL (ref 6.5–8.1)

## 2018-06-18 LAB — LIPASE, BLOOD: Lipase: 41 U/L (ref 11–51)

## 2018-06-18 LAB — URINALYSIS, COMPLETE (UACMP) WITH MICROSCOPIC
Bilirubin Urine: NEGATIVE
Glucose, UA: NEGATIVE mg/dL
Hgb urine dipstick: NEGATIVE
Ketones, ur: 5 mg/dL — AB
Leukocytes, UA: NEGATIVE
Nitrite: NEGATIVE
Protein, ur: NEGATIVE mg/dL
Specific Gravity, Urine: 1.003 — ABNORMAL LOW (ref 1.005–1.030)
pH: 6 (ref 5.0–8.0)

## 2018-06-18 LAB — CBC
HCT: 38.4 % (ref 35.0–47.0)
Hemoglobin: 12.6 g/dL (ref 12.0–16.0)
MCH: 25.3 pg — ABNORMAL LOW (ref 26.0–34.0)
MCHC: 32.9 g/dL (ref 32.0–36.0)
MCV: 76.9 fL — ABNORMAL LOW (ref 80.0–100.0)
Platelets: 293 10*3/uL (ref 150–440)
RBC: 4.99 MIL/uL (ref 3.80–5.20)
RDW: 18.1 % — ABNORMAL HIGH (ref 11.5–14.5)
WBC: 9 10*3/uL (ref 3.6–11.0)

## 2018-06-18 LAB — HCG, QUANTITATIVE, PREGNANCY: hCG, Beta Chain, Quant, S: 4567 m[IU]/mL — ABNORMAL HIGH (ref <5)

## 2018-06-18 NOTE — ED Provider Notes (Signed)
[facility] Emergency Department Provider Note       Time seen: ----------------------------------------- 10:10 AM on 06/18/2018 -----------------------------------------   I have reviewed the triage vital signs and the nursing notes.  HISTORY   Chief Complaint Abdominal Pain    HPI Kimberly Farmer is a 22 y.o. female with a history of arthritis, polycystic ovarian disease who presents to the ED for left-sided abdominal pain for the past 3 weeks.  She has had nausea and has had intermittent abdominal spotting.  She also has had nausea, just found out she is pregnant last week.  She thinks she is between 4 and [redacted] weeks pregnant.  She is G2, P1 Ab0  Past Medical History:  Diagnosis Date  . Arthritis    Bilateral knees and back  . Lumbar disc disease   . PCOS (polycystic ovarian syndrome)     Patient Active Problem List   Diagnosis Date Noted  . Breakthrough bleeding on birth control pills 05/16/2018  . PCOS (polycystic ovarian syndrome) 03/05/2018  . BMI 37.0-37.9, adult 10/11/2016  . Chronic midline low back pain without sciatica 04/24/2016  . 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 F/H of alcoholism 09/27/2015  . Broken leg 09/27/2015  . Bone disease 09/24/2014  . 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 KNEE SURGERY Left 2011   [name] has had 5 surgeries on her knee  . TONSILLECTOMY AND ADENOIDECTOMY  2007    Allergies Hydrocodone; Oxycodone; and Tape  Social History Social History   Tobacco Use  . Smoking status: Former Smoker    Types: E-cigarettes  . Smokeless tobacco: Former Neurosurgeon  . Tobacco comment: Vape - Reduced Nicotine  Substance Use Topics  . Alcohol use: No  . Drug use: No   Review of Systems Constitutional: Negative for fever. Cardiovascular: Negative for chest pain. Respiratory: Negative for shortness of  breath. Gastrointestinal: Positive for abdominal pain Genitourinary: Positive for vaginal spotting Musculoskeletal: Negative for back pain. Skin: Negative for rash. Neurological: Negative for headaches, focal weakness or numbness.  All systems negative/normal/unremarkable except as stated in the HPI  ____________________________________________   PHYSICAL EXAM:  VITAL SIGNS: ED Triage Vitals  Enc Vitals Group     BP 06/18/18 0924 134/67     Pulse Rate 06/18/18 0924 91     Resp 06/18/18 0924 20     Temp 06/18/18 0924 98.5 F (36.9 C)     Temp Source 06/18/18 0924 Oral     SpO2 06/18/18 0924 100 %     Weight 06/18/18 0924 225 lb (102.1 kg)     Height 06/18/18 0924 5\' 6"  (1.676 m)     Head Circumference --      Peak Flow --      Pain Score 06/18/18 0936 4     Pain Loc --      Pain Edu? --      Excl. in GC? --    Constitutional: Alert and oriented. Well appearing and in no distress. Eyes: Conjunctivae are normal. Normal extraocular movements. Cardiovascular: Normal rate, regular rhythm. No murmurs, rubs, or gallops. Respiratory: Normal respiratory effort without tachypnea nor retractions. Breath sounds are clear and equal bilaterally. No wheezes/rales/rhonchi. Gastrointestinal: Soft and nontender. Normal bowel sounds Musculoskeletal: Nontender with normal range of motion in extremities. No lower extremity tenderness nor edema. Neurologic:  Normal speech and language. No gross focal neurologic deficits are appreciated.  Skin:  Skin is warm, dry and intact. No rash noted. Psychiatric: Mood and affect are normal. Speech and behavior are normal.   ____________________________________________  ED COURSE:  As part of my medical decision making, I reviewed the following data within the electronic MEDICAL RECORD NUMBER History obtained from family if available, nursing notes, old chart and ekg, as well as notes from prior ED visits. Patient presented for abdominal pain as well as  vaginal spotting in early pregnancy, we will assess with labs and imaging as indicated at this time.   Procedures ____________________________________________   LABS (pertinent positives/negatives)  Labs Reviewed  COMPREHENSIVE METABOLIC PANEL - Abnormal; Notable for the following components:      Result Value   BUN 5 (*)    All other components within normal limits  CBC - Abnormal; Notable for the following components:   MCV 76.9 (*)    MCH 25.3 (*)    RDW 18.1 (*)    All other components within normal limits  URINALYSIS, COMPLETE (UACMP) WITH MICROSCOPIC - Abnormal; Notable for the following components:   Color, Urine STRAW (*)    APPearance CLEAR (*)    Specific Gravity, Urine 1.003 (*)    Ketones, ur 5 (*)    Bacteria, UA RARE (*)    All other components within normal limits  HCG, QUANTITATIVE, PREGNANCY - Abnormal; Notable for the following components:   hCG, Beta Chain, Quant, S 4,567 (*)    All other components within normal limits  LIPASE, BLOOD    RADIOLOGY Images were viewed by me  Pregnancy ultrasound IMPRESSION: Small intrauterine gestational sac without fetal pole or yolk sac. By mean sac diameter, the estimated gestational age is 5 weeks 2 days which is significantly less than the estimated gestational age based on last menstrual period. Recommend follow-up quantitative B-HCG levels and follow-up US in 14 days to assess viability. This recommendation follows SRU consensus guidelines: Diagnostic Criteria for Nonviable Pregnancy Early in the First Trimester. Malva Limes Med 2013; 161:0960-45.  Normal ovaries.  No evidence of ovarian torsion at this time. ____________________________________________  DIFFERENTIAL DIAGNOSIS   Normal pregnancy, polycystic ovaries, ectopic, threatened miscarriage  FINAL ASSESSMENT AND PLAN  Threatened miscarriage   Plan: The patient had presented for abdominal pain and spotting in early pregnancy. Patient's labs did  reveal an elevated hCG level as expected. Patient's imaging revealed about a 5-week and 2-day intrauterine pregnancy.  She is advised to follow-up in 14 days for reevaluation.   Ulice Dash, MD   Note: This note was generated in part or whole with voice recognition software. Voice recognition is usually quite accurate but there are transcription errors that can and very often do occur. I apologize for any typographical errors that were not detected and corrected.     Emily Filbert, MD 06/18/18 1153

## 2018-06-18 NOTE — ED Triage Notes (Signed)
Pt c/o left mid abd pain for the past 3 weeks  With nausea and spotting. Pt states she just found out she is pregnant last week.

## 2018-06-19 NOTE — Progress Notes (Signed)
I have reviewed the chart and agree with nursing staff's documentation of this patient's encounter.  [place] Bing, MD 06/19/2018 11:35 AM

## 2018-06-21 ENCOUNTER — Other Ambulatory Visit (HOSPITAL_COMMUNITY)
Admission: RE | Admit: 2018-06-21 | Discharge: 2018-06-21 | Disposition: A | Discharge status: Home / Self Care | Payer: 59 | Source: Ambulatory Visit | Attending: Obstetrics & Gynecology | Admitting: Obstetrics & Gynecology

## 2018-06-21 ENCOUNTER — Encounter: Payer: Self-pay | Admitting: Obstetrics & Gynecology

## 2018-06-21 ENCOUNTER — Ambulatory Visit (INDEPENDENT_AMBULATORY_CARE_PROVIDER_SITE_OTHER): Payer: 59 | Admitting: Obstetrics & Gynecology

## 2018-06-21 VITALS — BP 110/60 | Wt 233.0 lb

## 2018-06-21 DIAGNOSIS — Z124 Encounter for screening for malignant neoplasm of cervix: Secondary | ICD-10-CM

## 2018-06-21 DIAGNOSIS — N926 Irregular menstruation, unspecified: Secondary | ICD-10-CM

## 2018-06-21 DIAGNOSIS — O09299 Supervision of pregnancy with other poor reproductive or obstetric history, unspecified trimester: Secondary | ICD-10-CM

## 2018-06-21 DIAGNOSIS — O0991 Supervision of high risk pregnancy, unspecified, first trimester: Secondary | ICD-10-CM

## 2018-06-21 DIAGNOSIS — O0990 Supervision of high risk pregnancy, unspecified, unspecified trimester: Secondary | ICD-10-CM | POA: Insufficient documentation

## 2018-06-21 DIAGNOSIS — Z3A01 Less than 8 weeks gestation of pregnancy: Secondary | ICD-10-CM

## 2018-06-21 DIAGNOSIS — O09291 Supervision of pregnancy with other poor reproductive or obstetric history, first trimester: Secondary | ICD-10-CM

## 2018-06-21 LAB — BETA HCG QUANT (REF LAB): hCG Quant: 8396 m[IU]/mL

## 2018-06-21 NOTE — Progress Notes (Signed)
06/21/2018   Chief Complaint: Missed period, Irreg Cycles  Transfer of Care Patient: no  History of Present Illness: Kimberly Farmer is a 22 y.o. G3P1011 [redacted]w[redacted]d based on Patient's last menstrual period was 05/12/2018. with an Estimated Date of Delivery: 02/16/19, with the above CC.   Her periods were: irregular periods w PCOS, LMP June although had 1 days bleed in July. She was using oral contraceptives (estrogen/progesterone) when she conceived.  She has Positive signs or symptoms of nausea/vomiting of pregnancy. She has Negative signs or symptoms of miscarriage or preterm labor She identifies Negative Zika risk factors for her and her partner On any different medications around the time she conceived/early pregnancy: Yes  History of varicella: Yes    Pt had beta 4500 on 06/18/18 and [place] that day w Gest Sac but no CRL  ROS: A 12-point review of systems was performed and negative, except as stated in the above HPI.  OBGYN History: As per HPI. OB History  Gravida Para Term Preterm AB Living  3 1 1  0 1 1  SAB TAB Ectopic Multiple Live Births  1 0 0 0 1    # Outcome Date GA Lbr Len/2nd Weight Sex Delivery Anes PTL Lv  3 Current           2 Term 04/28/17 [redacted]w[redacted]d 02:57 / 03:53 7 lb 14.8 oz (3.595 kg) M Vag-Spont EPI  LIV  1 SAB 10/16/14 [redacted]w[redacted]d    SAB       Any issues with any prior pregnancies: yes- Preeclampsia Any prior children are healthy, doing well, without any problems or issues: yes History of pap smears:No. Abnormal: not applicable  History of STIs: No   Past Medical History: Past Medical History:  Diagnosis Date  . Arthritis    Bilateral knees and back  . Lumbar disc disease   . PCOS (polycystic ovarian syndrome)     Past Surgical History: Past Surgical History:  Procedure Laterality Date  . KNEE SURGERY Left 2011   [name] has had 5 surgeries on her knee  . TONSILLECTOMY AND ADENOIDECTOMY  2007    Family History:  Family History  Problem Relation Age of Onset  .  Hypertension Mother   . Alcohol abuse Father   . Diabetes Maternal Grandmother   . Hypertension Maternal Grandmother   . Cancer Paternal Grandfather        skin   She denies any female cancers, bleeding or blood clotting disorders.  She denies any history of mental retardation, birth defects or genetic disorders in her or the FOB's history  Social History:  Social History   Socioeconomic History  . Marital status: Single    Spouse name: Not on file  . Number of children: Not on file  . Years of education: Not on file  . Highest education level: Not on file  Occupational History  . Not on file  Social Needs  . Financial resource strain: Not on file  . Food insecurity:    Worry: Not on file    Inability: Not on file  . Transportation needs:    Medical: Not on file    Non-medical: Not on file  Tobacco Use  . Smoking status: Former Smoker    Types: E-cigarettes  . Smokeless tobacco: Former Neurosurgeon  . Tobacco comment: Vape - Reduced Nicotine  Substance and Sexual Activity  . Alcohol use: No  . Drug use: No  . Sexual activity: Not Currently    Partners: Male  Birth control/protection: Pill  Lifestyle  . Physical activity:    Days per week: Not on file    Minutes per session: Not on file  . Stress: Not on file  Relationships  . Social connections:    Talks on phone: Not on file    Gets together: Not on file    Attends religious service: Not on file    Active member of club or organization: Not on file    Attends meetings of clubs or organizations: Not on file    Relationship status: Not on file  . Intimate partner violence:    Fear of current or ex partner: Not on file    Emotionally abused: Not on file    Physically abused: Not on file    Forced sexual activity: Not on file  Other Topics Concern  . Not on file  Social History Narrative  . Not on file   Any pets in the household: no  Allergy: Allergies  Allergen Reactions  . Hydrocodone Other (See Comments)     Other Reaction: SEVERE HA & GI Upset  . Oxycodone Other (See Comments)    Other Reaction: SEVERE HA & GI Upset  . Tape Rash    Current Outpatient Medications:  Current Outpatient Medications:  .  cyclobenzaprine (FLEXERIL) 5 MG tablet, Take 1 tablet (5 mg total) by mouth 3 (three) times daily as needed for muscle spasms. (Patient not taking: Reported on 06/14/2018), Disp: 30 tablet, Rfl: 1 .  topiramate (TOPAMAX) 50 MG tablet, START WITH 1 TABLET BY MOUTH DAILY FOR 1 WEEK, THEN INCREASE TO 1 TABLET BY MOUTH TWICE A DAY (Patient not taking: Reported on 06/14/2018), Disp: 60 tablet, Rfl: 5   Physical Exam:   BP 110/60   Wt 233 lb (105.7 kg)   LMP 05/12/2018   BMI 37.61 kg/m  Body mass index is 37.61 kg/m. Constitutional: Well nourished, well developed female in no acute distress.  Neck:  Supple, normal appearance, and no thyromegaly  Cardiovascular: S1, S2 normal, no murmur, rub or gallop, regular rate and rhythm Respiratory:  Clear to auscultation bilateral. Normal respiratory effort Abdomen: positive bowel sounds and no masses, hernias; diffusely non tender to palpation, non distended Breasts: breasts appear normal, no suspicious masses, no skin or nipple changes or axillary nodes. Neuro/Psych:  Normal mood and affect.  Skin:  Warm and dry.  Lymphatic:  No inguinal lymphadenopathy.   Pelvic exam: is not limited by body habitus EGBUS: within normal limits, Vagina: within normal limits and with no blood in the vault, Cervix: normal appearing cervix without discharge or lesions, closed/long/high, Uterus:  enlarged: 4-5 weeks, and Adnexa:  normal adnexa  Assessment: Kimberly Farmer is a 22 y.o. G3P1011 [redacted]w[redacted]d based on Patient's last menstrual period was 05/12/2018. with an Estimated Date of Delivery: 02/16/19,  for prenatal care.  Plan:  1) Avoid alcoholic beverages. 2) Patient encouraged not to smoke.  3) Discontinue the use of all non-medicinal drugs and chemicals.  4) Take prenatal  vitamins daily.  5) Seatbelt use advised 6) Nutrition, food safety (fish, cheese advisories, and high nitrite foods) and exercise discussed. 7) Hospital and practice style delivering at [facility] discussed  8) Patient is asked about travel to areas at risk for the Zika virus, and counseled to avoid travel and exposure to mosquitoes or sexual partners who may have themselves been exposed to the virus. Testing is discussed, and will be ordered as appropriate.  9) Childbirth classes at [facility] advised 10) [place] for  dating next week and beta hCG today 11) h/o Preeclampsia. Baseline labs today. Plan ASA after 12 weeks as preventative measure (discussed w pt today). 12) Obesity (BMI 36) discussed.  Problem list reviewed and updated.  Annamarie Major, MD, [facility], [facility] 06/21/2018  9:54 AM

## 2018-06-21 NOTE — Patient Instructions (Signed)
First Trimester of Pregnancy The first trimester of pregnancy is from week 1 until the end of week 13 (months 1 through 3). A week after a sperm fertilizes an egg, the egg will implant on the wall of the uterus. This embryo will begin to develop into a baby. Genes from you and your partner will form the baby. The female genes will determine whether the baby will be a boy or a girl. At 6-8 weeks, the eyes and face will be formed, and the heartbeat can be seen on ultrasound. At the end of 12 weeks, all the baby's organs will be formed. Now that you are pregnant, you will want to do everything you can to have a healthy baby. Two of the most important things are to get good prenatal care and to follow your health care provider's instructions. Prenatal care is all the medical care you receive before the baby's birth. This care will help prevent, find, and treat any problems during the pregnancy and childbirth. Body changes during your first trimester Your body goes through many changes during pregnancy. The changes vary from woman to woman.  You may gain or lose a couple of pounds at first.  You may feel sick to your stomach (nauseous) and you may throw up (vomit). If the vomiting is uncontrollable, call your health care provider.  You may tire easily.  You may develop headaches that can be relieved by medicines. All medicines should be approved by your health care provider.  You may urinate more often. Painful urination may mean you have a bladder infection.  You may develop heartburn as a result of your pregnancy.  You may develop constipation because certain hormones are causing the muscles that push stool through your intestines to slow down.  You may develop hemorrhoids or swollen veins (varicose veins).  Your breasts may begin to grow larger and become tender. Your nipples may stick out more, and the tissue that surrounds them (areola) may become darker.  Your gums may bleed and may be  sensitive to brushing and flossing.  Dark spots or blotches (chloasma, mask of pregnancy) may develop on your face. This will likely fade after the baby is born.  Your menstrual periods will stop.  You may have a loss of appetite.  You may develop cravings for certain kinds of food.  You may have changes in your emotions from day to day, such as being excited to be pregnant or being concerned that something may go wrong with the pregnancy and baby.  You may have more vivid and strange dreams.  You may have changes in your hair. These can include thickening of your hair, rapid growth, and changes in texture. Some women also have hair loss during or after pregnancy, or hair that feels dry or thin. Your hair will most likely return to normal after your baby is born.  What to expect at prenatal visits During a routine prenatal visit:  You will be weighed to make sure you and the baby are growing normally.  Your blood pressure will be taken.  Your abdomen will be measured to track your baby's growth.  The fetal heartbeat will be listened to between weeks 10 and 14 of your pregnancy.  Test results from any previous visits will be discussed.  Your health care provider may ask you:  How you are feeling.  If you are feeling the baby move.  If you have had any abnormal symptoms, such as leaking fluid, bleeding, severe headaches,   or abdominal cramping.  If you are using any tobacco products, including cigarettes, chewing tobacco, and electronic cigarettes.  If you have any questions.  Other tests that may be performed during your first trimester include:  Blood tests to find your blood type and to check for the presence of any previous infections. The tests will also be used to check for low iron levels (anemia) and protein on red blood cells (Rh antibodies). Depending on your risk factors, or if you previously had diabetes during pregnancy, you may have tests to check for high blood  sugar that affects pregnant women (gestational diabetes).  Urine tests to check for infections, diabetes, or protein in the urine.  An ultrasound to confirm the proper growth and development of the baby.  Fetal screens for spinal cord problems (spina bifida) and Down syndrome.  HIV (human immunodeficiency virus) testing. Routine prenatal testing includes screening for HIV, unless you choose not to have this test.  You may need other tests to make sure you and the baby are doing well.  Follow these instructions at home: Medicines  Follow your health care provider's instructions regarding medicine use. Specific medicines may be either safe or unsafe to take during pregnancy.  Take a prenatal vitamin that contains at least 600 micrograms (mcg) of folic acid.  If you develop constipation, try taking a stool softener if your health care provider approves. Eating and drinking  Eat a balanced diet that includes fresh fruits and vegetables, whole grains, good sources of protein such as meat, eggs, or tofu, and low-fat dairy. Your health care provider will help you determine the amount of weight gain that is right for you.  Avoid raw meat and uncooked cheese. These carry germs that can cause birth defects in the baby.  Eating four or five small meals rather than three large meals a day may help relieve nausea and vomiting. If you start to feel nauseous, eating a few soda crackers can be helpful. Drinking liquids between meals, instead of during meals, also seems to help ease nausea and vomiting.  Limit foods that are high in fat and processed sugars, such as fried and sweet foods.  To prevent constipation: ? Eat foods that are high in fiber, such as fresh fruits and vegetables, whole grains, and beans. ? Drink enough fluid to keep your urine clear or pale yellow. Activity  Exercise only as directed by your health care provider. Most women can continue their usual exercise routine during  pregnancy. Try to exercise for 30 minutes at least 5 days a week. Exercising will help you: ? Control your weight. ? Stay in shape. ? Be prepared for labor and delivery.  Experiencing pain or cramping in the lower abdomen or lower back is a good sign that you should stop exercising. Check with your health care provider before continuing with normal exercises.  Try to avoid standing for long periods of time. Move your legs often if you must stand in one place for a long time.  Avoid heavy lifting.  Wear low-heeled shoes and practice good posture.  You may continue to have sex unless your health care provider tells you not to. Relieving pain and discomfort  Wear a good support bra to relieve breast tenderness.  Take warm sitz baths to soothe any pain or discomfort caused by hemorrhoids. Use hemorrhoid cream if your health care provider approves.  Rest with your legs elevated if you have leg cramps or low back pain.  If you develop   varicose veins in your legs, wear support hose. Elevate your feet for 15 minutes, 3-4 times a day. Limit salt in your diet. Prenatal care  Schedule your prenatal visits by the twelfth week of pregnancy. They are usually scheduled monthly at first, then more often in the last 2 months before delivery.  Write down your questions. Take them to your prenatal visits.  Keep all your prenatal visits as told by your health care provider. This is important. Safety  Wear your seat belt at all times when driving.  Make a list of emergency phone numbers, including numbers for family, friends, the hospital, and police and fire departments. General instructions  Ask your health care provider for a referral to a local prenatal education class. Begin classes no later than the beginning of month 6 of your pregnancy.  Ask for help if you have counseling or nutritional needs during pregnancy. Your health care provider can offer advice or refer you to specialists for help  with various needs.  Do not use hot tubs, steam rooms, or saunas.  Do not douche or use tampons or scented sanitary pads.  Do not cross your legs for long periods of time.  Avoid cat litter boxes and soil used by cats. These carry germs that can cause birth defects in the baby and possibly loss of the fetus by miscarriage or stillbirth.  Avoid all smoking, herbs, alcohol, and medicines not prescribed by your health care provider. Chemicals in these products affect the formation and growth of the baby.  Do not use any products that contain nicotine or tobacco, such as cigarettes and e-cigarettes. If you need help quitting, ask your health care provider. You may receive counseling support and other resources to help you quit.  Schedule a dentist appointment. At home, brush your teeth with a soft toothbrush and be gentle when you floss. Contact a health care provider if:  You have dizziness.  You have mild pelvic cramps, pelvic pressure, or nagging pain in the abdominal area.  You have persistent nausea, vomiting, or diarrhea.  You have a bad smelling vaginal discharge.  You have pain when you urinate.  You notice increased swelling in your face, hands, legs, or ankles.  You are exposed to fifth disease or chickenpox.  You are exposed to German measles (rubella) and have never had it. Get help right away if:  You have a fever.  You are leaking fluid from your vagina.  You have spotting or bleeding from your vagina.  You have severe abdominal cramping or pain.  You have rapid weight gain or loss.  You vomit blood or material that looks like coffee grounds.  You develop a severe headache.  You have shortness of breath.  You have any kind of trauma, such as from a fall or a car accident. Summary  The first trimester of pregnancy is from week 1 until the end of week 13 (months 1 through 3).  Your body goes through many changes during pregnancy. The changes vary from  woman to woman.  You will have routine prenatal visits. During those visits, your health care provider will examine you, discuss any test results you may have, and talk with you about how you are feeling. This information is not intended to replace advice given to you by your health care provider. Make sure you discuss any questions you have with your health care provider. Document Released: 09/26/2001 Document Revised: 09/13/2016 Document Reviewed: 09/13/2016 Elsevier Interactive Patient Education  2018 Elsevier   Inc.  

## 2018-06-22 LAB — CMP AND LIVER
ALT: 16 IU/L (ref 0–32)
AST: 14 IU/L (ref 0–40)
Albumin: 4.5 g/dL (ref 3.5–5.5)
Alkaline Phosphatase: 62 IU/L (ref 39–117)
BUN: 5 mg/dL — ABNORMAL LOW (ref 6–20)
Bilirubin Total: 0.2 mg/dL (ref 0.0–1.2)
Bilirubin, Direct: 0.08 mg/dL (ref 0.00–0.40)
CO2: 20 mmol/L (ref 20–29)
Calcium: 9.7 mg/dL (ref 8.7–10.2)
Chloride: 106 mmol/L (ref 96–106)
Creatinine, Ser: 0.56 mg/dL — ABNORMAL LOW (ref 0.57–1.00)
GFR calc Af Amer: 154 mL/min/{1.73_m2} (ref >59)
GFR calc non Af Amer: 134 mL/min/{1.73_m2} (ref >59)
Glucose: 85 mg/dL (ref 65–99)
Potassium: 3.9 mmol/L (ref 3.5–5.2)
Sodium: 138 mmol/L (ref 134–144)
Total Protein: 6.9 g/dL (ref 6.0–8.5)

## 2018-06-22 LAB — RPR+RH+ABO+RUB AB+AB SCR+CB...
Antibody Screen: NEGATIVE
HIV Screen 4th Generation wRfx: NONREACTIVE
Hematocrit: 41.1 % (ref 34.0–46.6)
Hemoglobin: 13 g/dL (ref 11.1–15.9)
Hepatitis B Surface Ag: NEGATIVE
MCH: 24.7 pg — ABNORMAL LOW (ref 26.6–33.0)
MCHC: 31.6 g/dL (ref 31.5–35.7)
MCV: 78 fL — ABNORMAL LOW (ref 79–97)
Platelets: 377 10*3/uL (ref 150–450)
RBC: 5.27 x10E6/uL (ref 3.77–5.28)
RDW: 16.6 % — ABNORMAL HIGH (ref 12.3–15.4)
RPR Ser Ql: NONREACTIVE
Rh Factor: POSITIVE
Rubella Antibodies, IGG: <0.9 index — ABNORMAL LOW (ref >0.99)
Varicella zoster IgG: 202 index (ref >165)
WBC: 8.9 10*3/uL (ref 3.4–10.8)

## 2018-06-23 LAB — PROTEIN / CREATININE RATIO, URINE
Creatinine, Urine: 14 mg/dL
Protein, Ur: <4 mg/dL

## 2018-06-23 LAB — DRUG SCREEN, URINE
Amphetamines, Urine: NEGATIVE ng/mL
Barbiturate screen, urine: NEGATIVE ng/mL
Benzodiazepine Quant, Ur: NEGATIVE ng/mL
Cannabinoid Quant, Ur: NEGATIVE ng/mL
Cocaine (Metab.): NEGATIVE ng/mL
Opiate Quant, Ur: NEGATIVE ng/mL
PCP Quant, Ur: NEGATIVE ng/mL

## 2018-06-23 LAB — URINE CULTURE: Organism ID, Bacteria: NO GROWTH

## 2018-06-24 ENCOUNTER — Telehealth: Payer: Self-pay | Admitting: Obstetrics & Gynecology

## 2018-06-24 LAB — CYTOLOGY - PAP
Chlamydia: NEGATIVE
Diagnosis: NEGATIVE
Neisseria Gonorrhea: NEGATIVE
Trichomonas: NEGATIVE

## 2018-06-24 NOTE — Progress Notes (Signed)
Plz try to move her [place] and F/U appt to Wednesday, OK to see me anytime.

## 2018-06-24 NOTE — Telephone Encounter (Signed)
Patient is reschedule 06/26/18

## 2018-06-24 NOTE — Telephone Encounter (Signed)
-----   Message from Nadara Mustard, MD sent at 06/24/2018  7:35 AM EDT ----- Plz try to move her [place] and F/U appt to Wednesday, OK to see me anytime.

## 2018-06-25 ENCOUNTER — Ambulatory Visit (INDEPENDENT_AMBULATORY_CARE_PROVIDER_SITE_OTHER): Payer: 59 | Admitting: Obstetrics and Gynecology

## 2018-06-25 ENCOUNTER — Ambulatory Visit (INDEPENDENT_AMBULATORY_CARE_PROVIDER_SITE_OTHER): Payer: 59

## 2018-06-25 ENCOUNTER — Encounter: Payer: Self-pay | Admitting: Obstetrics and Gynecology

## 2018-06-25 VITALS — BP 118/78 | Wt 228.0 lb

## 2018-06-25 DIAGNOSIS — Z3A01 Less than 8 weeks gestation of pregnancy: Secondary | ICD-10-CM

## 2018-06-25 DIAGNOSIS — O99210 Obesity complicating pregnancy, unspecified trimester: Secondary | ICD-10-CM | POA: Insufficient documentation

## 2018-06-25 DIAGNOSIS — O0991 Supervision of high risk pregnancy, unspecified, first trimester: Secondary | ICD-10-CM

## 2018-06-25 DIAGNOSIS — Z6837 Body mass index (BMI) 37.0-37.9, adult: Secondary | ICD-10-CM

## 2018-06-25 DIAGNOSIS — O09299 Supervision of pregnancy with other poor reproductive or obstetric history, unspecified trimester: Secondary | ICD-10-CM

## 2018-06-25 DIAGNOSIS — O99211 Obesity complicating pregnancy, first trimester: Secondary | ICD-10-CM

## 2018-06-25 DIAGNOSIS — O09291 Supervision of pregnancy with other poor reproductive or obstetric history, first trimester: Secondary | ICD-10-CM

## 2018-06-25 DIAGNOSIS — O3680X0 Pregnancy with inconclusive fetal viability, not applicable or unspecified: Secondary | ICD-10-CM

## 2018-06-25 NOTE — Progress Notes (Signed)
Routine Prenatal Care Visit  Subjective  Kimberly Farmer is a 22 y.o. G3P1011 at [redacted]w[redacted]d being seen today for ongoing prenatal care.  She is currently monitored for the following issues for this high-risk pregnancy and has Airway hyperreactivity; F/H of alcoholism; Broken leg; Osteochondritis dissecans of bilateral knees; Bone disease; Patellar instability; Chronic midline low back pain without sciatica; BMI 37.0-37.9, adult; PCOS (polycystic ovarian syndrome); Breakthrough bleeding on birth control pills; Supervision of high risk pregnancy, antepartum, first trimester; History of pre-eclampsia in prior pregnancy, currently pregnant; and Obesity affecting pregnancy on their problem list.  ----------------------------------------------------------------------------------- Patient reports no complaints.    . Vag. Bleeding: None.   . Denies leaking of fluid.  U/S confirms EDD. Heart rate 104 bpm, likely due to early gestational age (measured [redacted]w[redacted]d on u/s).  ----------------------------------------------------------------------------------- The following portions of the patient's history were reviewed and updated as appropriate: allergies, current medications, past family history, past medical history, past social history, past surgical history and problem list. Problem list updated.  Objective  Blood pressure 118/78, weight 228 lb (103.4 kg), last menstrual period 05/12/2018, not currently breastfeeding. Pregravid weight 225 lb (102.1 kg) Total Weight Gain 3 lb (1.361 kg) Urinalysis: Urine Protein    Urine Glucose    Fetal Status: Fetal Heart Rate (bpm): 104         General:  Alert, oriented and cooperative. Patient is in no acute distress.  Skin: Skin is warm and dry. No rash noted.   Cardiovascular: Normal heart rate noted  Respiratory: Normal respiratory effort, no problems with respiration noted  Abdomen: Soft, gravid, appropriate for gestational age. Pain/Pressure: Absent     Pelvic:   Cervical exam deferred        Extremities: Normal range of motion.     Mental Status: Normal mood and affect. Normal behavior. Normal judgment and thought content.   US Ob Transvaginal  Result Date: 06/25/2018 Patient Name: Kimberly Farmer DOB: April 01, 1996 MRN: [id number] ULTRASOUND REPORT Location: [facility] Date of Service: 06/25/2018 Indications:dating Findings: Mason Jim intrauterine pregnancy is visualized with a CRL consistent with [redacted]w[redacted]d gestation, giving an (U/S) EDD of 02/19/19. The (U/S) EDD is consistent with the clinically established EDD of 02/16/19. FHR: 104bpm (lower than the normal) CRL measurement: 2.9 mm Yolk sac is visualized and appears normal and early anatomy is normal. Amnion: not visualized Right Ovary is normal in appearance. Left Ovary is normal appearance. Corpus luteal cyst:  is not visualized Survey of the adnexa demonstrates no adnexal masses. There is no free peritoneal fluid in the cul de sac. Impression: 1. [redacted]w[redacted]d Viable Singleton Intrauterine pregnancy by U/S. 2. (U/S) EDD is consistent with Clinically established EDD of 02/16/19 3. FHR: 104bpm (lower than the normal). Needs a f/u US to recheck the FHR Recommendations: 1.Clinical correlation with the patient's History and Physical Exam. 2. Follow up ultrasound in one week to re-assess fetal heart rate. Abeer Alsammarraie,RDMS There is a viable singleton gestation.  Detailed evaluation of the fetal anatomy is precluded by early gestational age.  It must be noted that a normal ultrasound particular at this early gestational age is unable to rule out fetal aneuploidy, risk of first trimester miscarriage, or anatomic birth defects. Thomasene Mohair, MD, [facility], [facility] 06/25/2018 11:34 AM    Assessment   21 y.o. [id number] at [redacted]w[redacted]d by  02/16/2019, by Last Menstrual Period presenting for routine prenatal visit  Plan   pregnancy 3 Problems (from 04/08/18 to present)    Problem Noted  Resolved   Obesity  affecting pregnancy 06/25/2018 by Conard Novak, MD No   Supervision of high risk pregnancy, antepartum, first trimester 06/21/2018 by Nadara Mustard, MD No   Overview Signed 06/21/2018  9:58 AM by Nadara Mustard, MD    Clinic [facility] Prenatal Labs  Dating [place] Blood type:     Genetic Screen 1 Screen:    AFP:     Quad:     NIPS: Antibody:   Anatomic [place]  Rubella:   Varicella: @VZVIGG @  GTT Early:               Third trimester:  RPR:     Rhogam na HBsAg:     TDaP vaccine                       Flu Shot: HIV:     Baby Food                                GBS:   Contraception  Pap:  CBB     CS/VBAC    Support Person         History of pre-eclampsia in prior pregnancy, currently pregnant 06/21/2018 by Nadara Mustard, MD No   BMI 37.0-37.9, adult 10/11/2016 by Matthews Bing, MD No       Preterm labor symptoms and general obstetric precautions including but not limited to vaginal bleeding, contractions, leaking of fluid and fetal movement were reviewed in detail with the patient. Please refer to After Visit Summary for other counseling recommendations.   Return in about 1 week (around 07/02/2018) for schedule viability u/s, 1 hour gtt lab, and routine prenatal.  Thomasene Mohair, MD, [facility], [facility] 06/27/2018 10:17 AM

## 2018-06-26 ENCOUNTER — Encounter: Payer: 59 | Admitting: Obstetrics & Gynecology

## 2018-06-26 ENCOUNTER — Other Ambulatory Visit: Payer: 59

## 2018-06-27 ENCOUNTER — Other Ambulatory Visit: Payer: 59

## 2018-06-27 ENCOUNTER — Encounter: Payer: 59 | Admitting: Advanced Practice Midwife

## 2018-07-04 ENCOUNTER — Encounter: Payer: 59 | Admitting: Obstetrics and Gynecology

## 2018-07-04 ENCOUNTER — Other Ambulatory Visit: Payer: 59

## 2018-07-04 ENCOUNTER — Encounter: Payer: Self-pay | Admitting: Obstetrics and Gynecology

## 2018-07-04 ENCOUNTER — Ambulatory Visit (INDEPENDENT_AMBULATORY_CARE_PROVIDER_SITE_OTHER): Payer: 59 | Admitting: Obstetrics and Gynecology

## 2018-07-04 ENCOUNTER — Ambulatory Visit (INDEPENDENT_AMBULATORY_CARE_PROVIDER_SITE_OTHER): Payer: 59

## 2018-07-04 VITALS — BP 118/78 | Wt 222.0 lb

## 2018-07-04 DIAGNOSIS — O09299 Supervision of pregnancy with other poor reproductive or obstetric history, unspecified trimester: Secondary | ICD-10-CM

## 2018-07-04 DIAGNOSIS — O09291 Supervision of pregnancy with other poor reproductive or obstetric history, first trimester: Secondary | ICD-10-CM | POA: Diagnosis not present

## 2018-07-04 DIAGNOSIS — O99211 Obesity complicating pregnancy, first trimester: Secondary | ICD-10-CM

## 2018-07-04 DIAGNOSIS — Z3A01 Less than 8 weeks gestation of pregnancy: Secondary | ICD-10-CM

## 2018-07-04 DIAGNOSIS — O0991 Supervision of high risk pregnancy, unspecified, first trimester: Secondary | ICD-10-CM

## 2018-07-04 DIAGNOSIS — Z6837 Body mass index (BMI) 37.0-37.9, adult: Secondary | ICD-10-CM

## 2018-07-04 DIAGNOSIS — O3680X0 Pregnancy with inconclusive fetal viability, not applicable or unspecified: Secondary | ICD-10-CM | POA: Diagnosis not present

## 2018-07-04 DIAGNOSIS — O0990 Supervision of high risk pregnancy, unspecified, unspecified trimester: Secondary | ICD-10-CM

## 2018-07-04 DIAGNOSIS — Z1379 Encounter for other screening for genetic and chromosomal anomalies: Secondary | ICD-10-CM

## 2018-07-04 NOTE — Progress Notes (Signed)
Routine Prenatal Care Visit  Subjective  Kimberly Farmer is a 22 y.o. G3P1011 at [redacted]w[redacted]d being seen today for ongoing prenatal care.  She is currently monitored for the following issues for this high-risk pregnancy and has Airway hyperreactivity; F/H of alcoholism; Broken leg; Osteochondritis dissecans of bilateral knees; Bone disease; Patellar instability; Chronic midline low back pain without sciatica; BMI 37.0-37.9, adult; PCOS (polycystic ovarian syndrome); Breakthrough bleeding on birth control pills; Supervision of high risk pregnancy, antepartum; History of pre-eclampsia in prior pregnancy, currently pregnant; and Obesity affecting pregnancy on their problem list.  ----------------------------------------------------------------------------------- Patient reports no complaints.   Contractions: Not present. Vag. Bleeding: None.   . Denies leaking of fluid.  ----------------------------------------------------------------------------------- The following portions of the patient's history were reviewed and updated as appropriate: allergies, current medications, past family history, past medical history, past social history, past surgical history and problem list. Problem list updated.   Objective  Blood pressure 118/78, weight 222 lb (100.7 kg), last menstrual period 05/12/2018, not currently breastfeeding. Pregravid weight 225 lb (102.1 kg) Total Weight Gain -3 lb (-1.361 kg) Urinalysis: Urine Protein    Urine Glucose    Fetal Status: Fetal Heart Rate (bpm): 145         General:  Alert, oriented and cooperative. Patient is in no acute distress.  Skin: Skin is warm and dry. No rash noted.   Cardiovascular: Normal heart rate noted  Respiratory: Normal respiratory effort, no problems with respiration noted  Abdomen: Soft, gravid, appropriate for gestational age. Pain/Pressure: Absent     Pelvic:  Cervical exam deferred        Extremities: Normal range of motion.     Mental Status: Normal  mood and affect. Normal behavior. Normal judgment and thought content.   Imaging Studies: US Ob Comp Less 14 Wks  Result Date: 07/04/2018 ULTRASOUND REPORT Location: [facility] Date of Service: 07/04/2018 Patient Name: Kimberly Farmer DOB: [birth year] MRN: [id number] Indications:Viability, Low heart rate Findings: Mason Jim intrauterine pregnancy is visualized with a CRL consistent with [redacted]w[redacted]d gestation, giving an (U/S) EDD of 02/18/2019. The (U/S) EDD is consistent with the clinically established EDD of 02/16/2019. FHR: 145 bpm CRL measurement: 11.6 mm Yolk sac is visualized and appears normal and early anatomy is normal. Amnion: visualized and appears normal Right Ovary is normal in appearance. Left Ovary is normal appearance. Left ovary paraovarian cystic area measures 2.0 x 1.7 cm Corpus luteal cyst:  is not visualized Survey of the adnexa demonstrates no adnexal masses. There is no free peritoneal fluid in the cul de sac. Impression: 1. [redacted]w[redacted]d Viable Singleton Intrauterine pregnancy by U/S. 2. (U/S) EDD is consistent with Clinically established EDD of 02/16/2019. 3. Left ovary paraovarian cystic area measures 2.0 x 1.7 cm Mital Patel, RDMS There is a viable singleton gestation.  Detailed evaluation of the fetal anatomy is precluded by early gestational age.  It must be noted that a normal ultrasound particular at this early gestational age is unable to rule out fetal aneuploidy, risk of first trimester miscarriage, or anatomic birth defects. Thomasene Mohair, MD, [facility], [facility] 07/04/2018 3:15 PM      Assessment   21 y.o. [id number] at [redacted]w[redacted]d by  02/16/2019, by Last Menstrual Period presenting for routine prenatal visit  Plan   pregnancy 3 Problems (from 04/08/18 to present)    Problem Noted Resolved   Obesity affecting pregnancy 06/25/2018 by Conard Novak, MD No   Supervision of high risk pregnancy, antepartum 06/21/2018 by Tiburcio Pea,  Harrel Lemonobert P, MD No   Overview Signed  06/21/2018  9:58 AM by Nadara MustardHarris, Robert P, MD    Clinic [facility] Prenatal Labs  Dating US Blood type:     Genetic Screen 1 Screen:    AFP:     Quad:     NIPS: Antibody:   Anatomic US  Rubella:   Varicella: @VZVIGG @  GTT Early:               Third trimester:  RPR:     Rhogam na HBsAg:     TDaP vaccine                       Flu Shot: HIV:     Baby Food                                GBS:   Contraception  Pap:  CBB     CS/VBAC    Support Person            History of pre-eclampsia in prior pregnancy, currently pregnant 06/21/2018 by Nadara MustardHarris, Robert P, MD No   BMI 37.0-37.9, adult 10/11/2016 by [place] BingPickens, Charlie, MD No       Preterm labor symptoms and general obstetric precautions including but not limited to vaginal bleeding, contractions, leaking of fluid and fetal movement were reviewed in detail with the patient. Please refer to After Visit Summary for other counseling recommendations.   - Patient reassured regarding heart rate. - accepts genetic screening testing  Return in about 4 weeks (around 08/01/2018) for U/S For NT and labs and Routine Prenatal Appointment.  Thomasene MohairStephen Beverlee Wilmarth, MD, [facility] FrederickFACOG [facility], [facility] 07/04/2018 3:27 PM

## 2018-07-05 LAB — GLUCOSE, 1 HOUR GESTATIONAL: Gestational Diabetes Screen: 98 mg/dL (ref 65–139)

## 2018-07-12 ENCOUNTER — Ambulatory Visit: Payer: 59 | Admitting: Obstetrics & Gynecology

## 2018-07-17 ENCOUNTER — Telehealth: Payer: Self-pay

## 2018-07-17 NOTE — Telephone Encounter (Signed)
vm not set up yet

## 2018-07-17 NOTE — Telephone Encounter (Signed)
Pt has had a lot of mucus. It was normal colored & then turned pink and about an hour ago has a red tint. ZO#[phone].

## 2018-07-17 NOTE — Telephone Encounter (Signed)
Patient is returning missed call please advise? 

## 2018-07-24 ENCOUNTER — Encounter: Payer: 59 | Admitting: Family Medicine

## 2018-08-01 ENCOUNTER — Ambulatory Visit (INDEPENDENT_AMBULATORY_CARE_PROVIDER_SITE_OTHER): Payer: 59 | Admitting: Maternal Newborn

## 2018-08-01 ENCOUNTER — Ambulatory Visit (INDEPENDENT_AMBULATORY_CARE_PROVIDER_SITE_OTHER): Payer: 59

## 2018-08-01 VITALS — BP 110/60 | Wt 224.0 lb

## 2018-08-01 DIAGNOSIS — O0990 Supervision of high risk pregnancy, unspecified, unspecified trimester: Secondary | ICD-10-CM

## 2018-08-01 DIAGNOSIS — Z3682 Encounter for antenatal screening for nuchal translucency: Secondary | ICD-10-CM | POA: Diagnosis not present

## 2018-08-01 DIAGNOSIS — Z23 Encounter for immunization: Secondary | ICD-10-CM | POA: Diagnosis not present

## 2018-08-01 DIAGNOSIS — Z1379 Encounter for other screening for genetic and chromosomal anomalies: Secondary | ICD-10-CM

## 2018-08-01 DIAGNOSIS — O09291 Supervision of pregnancy with other poor reproductive or obstetric history, first trimester: Secondary | ICD-10-CM

## 2018-08-01 DIAGNOSIS — Z3A11 11 weeks gestation of pregnancy: Secondary | ICD-10-CM

## 2018-08-01 NOTE — Patient Instructions (Signed)
First Trimester of Pregnancy The first trimester of pregnancy is from week 1 until the end of week 13 (months 1 through 3). A week after a sperm fertilizes an egg, the egg will implant on the wall of the uterus. This embryo will begin to develop into a baby. Genes from you and your partner will form the baby. The female genes will determine whether the baby will be a boy or a girl. At 6-8 weeks, the eyes and face will be formed, and the heartbeat can be seen on ultrasound. At the end of 12 weeks, all the baby's organs will be formed. Now that you are pregnant, you will want to do everything you can to have a healthy baby. Two of the most important things are to get good prenatal care and to follow your health care provider's instructions. Prenatal care is all the medical care you receive before the baby's birth. This care will help prevent, find, and treat any problems during the pregnancy and childbirth. Body changes during your first trimester Your body goes through many changes during pregnancy. The changes vary from woman to woman.  You may gain or lose a couple of pounds at first.  You may feel sick to your stomach (nauseous) and you may throw up (vomit). If the vomiting is uncontrollable, call your health care provider.  You may tire easily.  You may develop headaches that can be relieved by medicines. All medicines should be approved by your health care provider.  You may urinate more often. Painful urination may mean you have a bladder infection.  You may develop heartburn as a result of your pregnancy.  You may develop constipation because certain hormones are causing the muscles that push stool through your intestines to slow down.  You may develop hemorrhoids or swollen veins (varicose veins).  Your breasts may begin to grow larger and become tender. Your nipples may stick out more, and the tissue that surrounds them (areola) may become darker.  Your gums may bleed and may be  sensitive to brushing and flossing.  Dark spots or blotches (chloasma, mask of pregnancy) may develop on your face. This will likely fade after the baby is born.  Your menstrual periods will stop.  You may have a loss of appetite.  You may develop cravings for certain kinds of food.  You may have changes in your emotions from day to day, such as being excited to be pregnant or being concerned that something may go wrong with the pregnancy and baby.  You may have more vivid and strange dreams.  You may have changes in your hair. These can include thickening of your hair, rapid growth, and changes in texture. Some women also have hair loss during or after pregnancy, or hair that feels dry or thin. Your hair will most likely return to normal after your baby is born.  What to expect at prenatal visits During a routine prenatal visit:  You will be weighed to make sure you and the baby are growing normally.  Your blood pressure will be taken.  Your abdomen will be measured to track your baby's growth.  The fetal heartbeat will be listened to between weeks 10 and 14 of your pregnancy.  Test results from any previous visits will be discussed.  Your health care provider may ask you:  How you are feeling.  If you are feeling the baby move.  If you have had any abnormal symptoms, such as leaking fluid, bleeding, severe headaches,   or abdominal cramping.  If you are using any tobacco products, including cigarettes, chewing tobacco, and electronic cigarettes.  If you have any questions.  Other tests that may be performed during your first trimester include:  Blood tests to find your blood type and to check for the presence of any previous infections. The tests will also be used to check for low iron levels (anemia) and protein on red blood cells (Rh antibodies). Depending on your risk factors, or if you previously had diabetes during pregnancy, you may have tests to check for high blood  sugar that affects pregnant women (gestational diabetes).  Urine tests to check for infections, diabetes, or protein in the urine.  An ultrasound to confirm the proper growth and development of the baby.  Fetal screens for spinal cord problems (spina bifida) and Down syndrome.  HIV (human immunodeficiency virus) testing. Routine prenatal testing includes screening for HIV, unless you choose not to have this test.  You may need other tests to make sure you and the baby are doing well.  Follow these instructions at home: Medicines  Follow your health care provider's instructions regarding medicine use. Specific medicines may be either safe or unsafe to take during pregnancy.  Take a prenatal vitamin that contains at least 600 micrograms (mcg) of folic acid.  If you develop constipation, try taking a stool softener if your health care provider approves. Eating and drinking  Eat a balanced diet that includes fresh fruits and vegetables, whole grains, good sources of protein such as meat, eggs, or tofu, and low-fat dairy. Your health care provider will help you determine the amount of weight gain that is right for you.  Avoid raw meat and uncooked cheese. These carry germs that can cause birth defects in the baby.  Eating four or five small meals rather than three large meals a day may help relieve nausea and vomiting. If you start to feel nauseous, eating a few soda crackers can be helpful. Drinking liquids between meals, instead of during meals, also seems to help ease nausea and vomiting.  Limit foods that are high in fat and processed sugars, such as fried and sweet foods.  To prevent constipation: ? Eat foods that are high in fiber, such as fresh fruits and vegetables, whole grains, and beans. ? Drink enough fluid to keep your urine clear or pale yellow. Activity  Exercise only as directed by your health care provider. Most women can continue their usual exercise routine during  pregnancy. Try to exercise for 30 minutes at least 5 days a week. Exercising will help you: ? Control your weight. ? Stay in shape. ? Be prepared for labor and delivery.  Experiencing pain or cramping in the lower abdomen or lower back is a good sign that you should stop exercising. Check with your health care provider before continuing with normal exercises.  Try to avoid standing for long periods of time. Move your legs often if you must stand in one place for a long time.  Avoid heavy lifting.  Wear low-heeled shoes and practice good posture.  You may continue to have sex unless your health care provider tells you not to. Relieving pain and discomfort  Wear a good support bra to relieve breast tenderness.  Take warm sitz baths to soothe any pain or discomfort caused by hemorrhoids. Use hemorrhoid cream if your health care provider approves.  Rest with your legs elevated if you have leg cramps or low back pain.  If you develop   varicose veins in your legs, wear support hose. Elevate your feet for 15 minutes, 3-4 times a day. Limit salt in your diet. Prenatal care  Schedule your prenatal visits by the twelfth week of pregnancy. They are usually scheduled monthly at first, then more often in the last 2 months before delivery.  Write down your questions. Take them to your prenatal visits.  Keep all your prenatal visits as told by your health care provider. This is important. Safety  Wear your seat belt at all times when driving.  Make a list of emergency phone numbers, including numbers for family, friends, the hospital, and police and fire departments. General instructions  Ask your health care provider for a referral to a local prenatal education class. Begin classes no later than the beginning of month 6 of your pregnancy.  Ask for help if you have counseling or nutritional needs during pregnancy. Your health care provider can offer advice or refer you to specialists for help  with various needs.  Do not use hot tubs, steam rooms, or saunas.  Do not douche or use tampons or scented sanitary pads.  Do not cross your legs for long periods of time.  Avoid cat litter boxes and soil used by cats. These carry germs that can cause birth defects in the baby and possibly loss of the fetus by miscarriage or stillbirth.  Avoid all smoking, herbs, alcohol, and medicines not prescribed by your health care provider. Chemicals in these products affect the formation and growth of the baby.  Do not use any products that contain nicotine or tobacco, such as cigarettes and e-cigarettes. If you need help quitting, ask your health care provider. You may receive counseling support and other resources to help you quit.  Schedule a dentist appointment. At home, brush your teeth with a soft toothbrush and be gentle when you floss. Contact a health care provider if:  You have dizziness.  You have mild pelvic cramps, pelvic pressure, or nagging pain in the abdominal area.  You have persistent nausea, vomiting, or diarrhea.  You have a bad smelling vaginal discharge.  You have pain when you urinate.  You notice increased swelling in your face, hands, legs, or ankles.  You are exposed to fifth disease or chickenpox.  You are exposed to German measles (rubella) and have never had it. Get help right away if:  You have a fever.  You are leaking fluid from your vagina.  You have spotting or bleeding from your vagina.  You have severe abdominal cramping or pain.  You have rapid weight gain or loss.  You vomit blood or material that looks like coffee grounds.  You develop a severe headache.  You have shortness of breath.  You have any kind of trauma, such as from a fall or a car accident. Summary  The first trimester of pregnancy is from week 1 until the end of week 13 (months 1 through 3).  Your body goes through many changes during pregnancy. The changes vary from  woman to woman.  You will have routine prenatal visits. During those visits, your health care provider will examine you, discuss any test results you may have, and talk with you about how you are feeling. This information is not intended to replace advice given to you by your health care provider. Make sure you discuss any questions you have with your health care provider. Document Released: 09/26/2001 Document Revised: 09/13/2016 Document Reviewed: 09/13/2016 Elsevier Interactive Patient Education  2018 Elsevier   Inc.  

## 2018-08-01 NOTE — Progress Notes (Signed)
Routine Prenatal Care Visit  Subjective  Kimberly Farmer is a 22 y.o. G3P1011 at [redacted]w[redacted]d being seen today for ongoing prenatal care.  She is currently monitored for the following issues for this high-risk pregnancy and has Airway hyperreactivity; F/H of alcoholism; Broken leg; Osteochondritis dissecans of bilateral knees; Bone disease; Patellar instability; Chronic midline low back pain without sciatica; BMI 37.0-37.9, adult; PCOS (polycystic ovarian syndrome); Breakthrough bleeding on birth control pills; Supervision of high risk pregnancy, antepartum; History of pre-eclampsia in prior pregnancy, currently pregnant; and Obesity affecting pregnancy on their problem list.  ----------------------------------------------------------------------------------- Patient reports nausea.  Prefers not to take medications. Contractions: Not present. Vag. Bleeding: None. No leaking of fluid.  ----------------------------------------------------------------------------------- The following portions of the patient's history were reviewed and updated as appropriate: allergies, current medications, past family history, past medical history, past social history, past surgical history and problem list. Problem list updated.   Objective  Blood pressure 110/60, weight 224 lb (101.6 kg), last menstrual period 05/12/2018. Pregravid weight 225 lb (102.1 kg) Total Weight Gain -1 lb (-0.454 kg) Body mass index is 36.15 kg/m. Urinalysis: Not done Fetal Status: Fetal Heart Rate (bpm): 160         General:  Alert, oriented and cooperative. Patient is in no acute distress.  Skin: Skin is warm and dry. No rash noted.   Cardiovascular: Normal heart rate noted  Respiratory: Normal respiratory effort, no problems with respiration noted  Abdomen: Soft, gravid, appropriate for gestational age. Pain/Pressure: Absent     Pelvic:  Cervical exam deferred        Extremities: Normal range of motion.     Mental Status: Normal  mood and affect. Normal behavior. Normal judgment and thought content.     Assessment   22 y.o. G3P1011 at [redacted]w[redacted]d, EDD 02/16/2019 by Last Menstrual Period presenting for a routine prenatal visit.  Plan   pregnancy 3 Problems (from 04/08/18 to present)    Problem Noted Resolved   Obesity affecting pregnancy 06/25/2018 by Conard Novak, MD No   Supervision of high risk pregnancy, antepartum 06/21/2018 by Nadara Mustard, MD No   Overview Signed 06/21/2018  9:58 AM by Nadara Mustard, MD    Clinic [facility] Prenatal Labs  Dating [place] Blood type:     Genetic Screen 1 Screen:    AFP:     Quad:     NIPS: Antibody:   Anatomic [place]  Rubella:   Varicella: @VZVIGG @  GTT Early:               Third trimester:  RPR:     Rhogam na HBsAg:     TDaP vaccine                       Flu Shot: HIV:     Baby Food                                GBS:   Contraception  Pap:  CBB     CS/VBAC    Support Person            History of pre-eclampsia in prior pregnancy, currently pregnant 06/21/2018 by Nadara Mustard, MD No   BMI 37.0-37.9, adult 10/11/2016 by Reedley Bing, MD No     First trimester screen and NT scan done today. Flu vaccine accepted.  Return in about 4 weeks (around 08/29/2018) for ROB.  Marcelyn Bruins, CNM 08/01/2018  11:02 AM

## 2018-08-02 ENCOUNTER — Encounter: Payer: Self-pay | Admitting: Maternal Newborn

## 2018-08-04 LAB — FIRST TRIMESTER SCREEN W/NT
CRL: 51.7 mm
DIA MoM: 0.75
DIA Value: 155.7 pg/mL
Gest Age-Collect: 11.7 weeks
Maternal Age At EDD: 22.6 yr
Nuchal Translucency MoM: 1.41
Nuchal Translucency: 1.8 mm
Number of Fetuses: 1
PAPP-A MoM: 0.84
PAPP-A Value: 374.3 ng/mL
Test Results:: NEGATIVE
Weight: 224 [lb_av]
hCG MoM: 0.65
hCG Value: 52.5 IU/mL

## 2018-08-06 ENCOUNTER — Emergency Department
Admission: EM | Admit: 2018-08-06 | Discharge: 2018-08-06 | Disposition: A | Discharge status: Home / Self Care | Payer: Medicaid Other | Attending: Emergency Medicine | Admitting: Emergency Medicine

## 2018-08-06 ENCOUNTER — Other Ambulatory Visit: Payer: Self-pay

## 2018-08-06 DIAGNOSIS — Z3A13 13 weeks gestation of pregnancy: Secondary | ICD-10-CM | POA: Insufficient documentation

## 2018-08-06 DIAGNOSIS — O99332 Smoking (tobacco) complicating pregnancy, second trimester: Secondary | ICD-10-CM | POA: Insufficient documentation

## 2018-08-06 DIAGNOSIS — N898 Other specified noninflammatory disorders of vagina: Secondary | ICD-10-CM | POA: Diagnosis not present

## 2018-08-06 DIAGNOSIS — F17290 Nicotine dependence, other tobacco product, uncomplicated: Secondary | ICD-10-CM | POA: Diagnosis not present

## 2018-08-06 DIAGNOSIS — O9989 Other specified diseases and conditions complicating pregnancy, childbirth and the puerperium: Secondary | ICD-10-CM | POA: Diagnosis not present

## 2018-08-06 LAB — CHLAMYDIA/NGC RT PCR (ARMC ONLY)
Chlamydia Tr: NOT DETECTED
N gonorrhoeae: NOT DETECTED

## 2018-08-06 LAB — URINALYSIS, ROUTINE W REFLEX MICROSCOPIC
Bacteria, UA: NONE SEEN
Bilirubin Urine: NEGATIVE
Glucose, UA: NEGATIVE mg/dL
Hgb urine dipstick: NEGATIVE
Ketones, ur: NEGATIVE mg/dL
Nitrite: NEGATIVE
Protein, ur: NEGATIVE mg/dL
Specific Gravity, Urine: 1.005 (ref 1.005–1.030)
pH: 7 (ref 5.0–8.0)

## 2018-08-06 LAB — CBC
HCT: 39.2 % (ref 36.0–46.0)
Hemoglobin: 12.5 g/dL (ref 12.0–15.0)
MCH: 25.5 pg — ABNORMAL LOW (ref 26.0–34.0)
MCHC: 31.9 g/dL (ref 30.0–36.0)
MCV: 80 fL (ref 80.0–100.0)
Platelets: 301 10*3/uL (ref 150–400)
RBC: 4.9 MIL/uL (ref 3.87–5.11)
RDW: 15.4 % (ref 11.5–15.5)
WBC: 10.6 10*3/uL — ABNORMAL HIGH (ref 4.0–10.5)
nRBC: 0 % (ref 0.0–0.2)

## 2018-08-06 LAB — BASIC METABOLIC PANEL
Anion gap: 9 (ref 5–15)
BUN: 6 mg/dL (ref 6–20)
CO2: 24 mmol/L (ref 22–32)
Calcium: 9.1 mg/dL (ref 8.9–10.3)
Chloride: 103 mmol/L (ref 98–111)
Creatinine, Ser: 0.46 mg/dL (ref 0.44–1.00)
GFR calc Af Amer: >60 mL/min (ref >60)
GFR calc non Af Amer: >60 mL/min (ref >60)
Glucose, Bld: 83 mg/dL (ref 70–99)
Potassium: 3.6 mmol/L (ref 3.5–5.1)
Sodium: 136 mmol/L (ref 135–145)

## 2018-08-06 LAB — WET PREP, GENITAL
Clue Cells Wet Prep HPF POC: NONE SEEN
Sperm: NONE SEEN
Trich, Wet Prep: NONE SEEN
Yeast Wet Prep HPF POC: NONE SEEN

## 2018-08-06 MED ORDER — METRONIDAZOLE 500 MG PO TABS: 500.0000 mg | ORAL_TABLET | Freq: Two times a day (BID) | ORAL | 0 refills | Status: DC

## 2018-08-06 NOTE — ED Provider Notes (Addendum)
[facility] Emergency Department Provider Note  ____________________________________________  Time seen: Approximately 5:54 PM  I have reviewed the triage vital signs and the nursing notes.   HISTORY  Chief Complaint Vaginal Discharge   HPI Kimberly Farmer is a 22 y.o. female G2, P1 at [redacted] weeks gestational age who presents for evaluation of vaginal discharge.  Patient reports that she was wiping after urinating when she noticed a thick brownish discharge on the toilet paper.  She has not been having any more discharge since then.  She denies abdominal pain, loss of fluid per vagina, vaginal bleeding, dysuria, hematuria, fever or chills.  Patient was last sexually active 4 days ago with no condom.  She has establish care with OB/GYN and has had several ultrasounds already.  Past Medical History:  Diagnosis Date  . Arthritis    Bilateral knees and back  . Lumbar disc disease   . PCOS (polycystic ovarian syndrome)     Patient Active Problem List   Diagnosis Date Noted  . Obesity affecting pregnancy 06/25/2018  . Supervision of high risk pregnancy, antepartum 06/21/2018  . History of pre-eclampsia in prior pregnancy, currently pregnant 06/21/2018  . Breakthrough bleeding on birth control pills 05/16/2018  . PCOS (polycystic ovarian syndrome) 03/05/2018  . BMI 37.0-37.9, adult 10/11/2016  . Chronic midline low back pain without sciatica 04/24/2016  . 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 F/H of alcoholism 09/27/2015  . Broken leg 09/27/2015  . Bone disease 09/24/2014  . 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 KNEE SURGERY Left 2011   [name] has had 5 surgeries on her knee  . TONSILLECTOMY AND ADENOIDECTOMY  2007    Prior to Admission medications   Medication Sig Start Date End Date Taking? Authorizing Provider  cyclobenzaprine (FLEXERIL) 5 MG  tablet Take 1 tablet (5 mg total) by mouth 3 (three) times daily as needed for muscle spasms. Patient not taking: Reported on 06/14/2018 06/10/18   Margaretann Loveless, PA-C  topiramate (TOPAMAX) 50 MG tablet START WITH 1 TABLET BY MOUTH DAILY FOR 1 WEEK, THEN INCREASE TO 1 TABLET BY MOUTH TWICE A DAY Patient not taking: Reported on 06/14/2018 06/12/18   Margaretann Loveless, PA-C    Allergies Hydrocodone; Oxycodone; and Tape  Family History  Problem Relation Age of Onset  . Hypertension Mother   . Alcohol abuse Father   . Diabetes Maternal Grandmother   . Hypertension Maternal Grandmother   . Cancer Paternal Grandfather        skin    Social History Social History   Tobacco Use  . Smoking status: Former Smoker    Types: E-cigarettes  . Smokeless tobacco: Former Neurosurgeon  . Tobacco comment: Vape - Reduced Nicotine  Substance Use Topics  . Alcohol use: No  . Drug use: No    Review of Systems  Constitutional: Negative for fever. Eyes: Negative for visual changes. ENT: Negative for sore throat. Neck: No neck pain  Cardiovascular: Negative for chest pain. Respiratory: Negative for shortness of breath. Gastrointestinal: Negative for abdominal pain, vomiting or diarrhea. Genitourinary: Negative for dysuria. + vaginal discharge Musculoskeletal: Negative for back pain. Skin: Negative for rash. Neurological: Negative for headaches, weakness or numbness. Psych: No SI or HI  ____________________________________________   PHYSICAL EXAM:  VITAL SIGNS: ED Triage Vitals  Enc Vitals Group     BP 08/06/18 1550 137/69  Pulse Rate 08/06/18 1550 99     Resp --      Temp 08/06/18 1550 98.6 F (37 C)     Temp Source 08/06/18 1550 Oral     SpO2 08/06/18 1550 98 %     Weight 08/06/18 1550 223 lb (101.2 kg)     Height 08/06/18 1550 5\' 6"  (1.676 m)     Head Circumference --      Peak Flow --      Pain Score 08/06/18 1552 0     Pain Loc --      Pain Edu? --      Excl. in GC? --      Constitutional: Alert and oriented. Well appearing and in no apparent distress. HEENT:      Head: Normocephalic and atraumatic.         Eyes: Conjunctivae are normal. Sclera is non-icteric.       Mouth/Throat: Mucous membranes are moist.       Neck: Supple with no signs of meningismus. Cardiovascular: Regular rate and rhythm. No murmurs, gallops, or rubs. 2+ symmetrical distal pulses are present in all extremities. No JVD. Respiratory: Normal respiratory effort. Lungs are clear to auscultation bilaterally. No wheezes, crackles, or rhonchi.  Gastrointestinal: Soft, non tender, and non distended with positive bowel sounds. No rebound or guarding. Pelvic exam: Normal external genitalia, no rashes or lesions. Normal cervical mucus. Os closed. No cervical motion tenderness.  No uterine or adnexal tenderness.   Musculoskeletal: Nontender with normal range of motion in all extremities. No edema, cyanosis, or erythema of extremities. Neurologic: Normal speech and language. Face is symmetric. Moving all extremities. No gross focal neurologic deficits are appreciated. Skin: Skin is warm, dry and intact. No rash noted. Psychiatric: Mood and affect are normal. Speech and behavior are normal.  ____________________________________________   LABS (all labs ordered are listed, but only abnormal results are displayed)  Labs Reviewed  WET PREP, GENITAL - Abnormal; Notable for the following components:      Result Value   WBC, Wet Prep HPF POC FEW (*)    All other components within normal limits  URINALYSIS, ROUTINE W REFLEX MICROSCOPIC - Abnormal; Notable for the following components:   Color, Urine STRAW (*)    APPearance CLEAR (*)    Leukocytes, UA TRACE (*)    All other components within normal limits  CBC - Abnormal; Notable for the following components:   WBC 10.6 (*)    MCH 25.5 (*)    All other components within normal limits  CHLAMYDIA/NGC RT PCR ([facility] ONLY)  BASIC METABOLIC  PANEL   ____________________________________________  EKG  none  ____________________________________________  RADIOLOGY  none  ____________________________________________   PROCEDURES  Procedure(s) performed: None Procedures Critical Care performed:  None ____________________________________________   INITIAL IMPRESSION / ASSESSMENT AND PLAN / ED COURSE   22 y.o. female G49, P1 at [redacted] weeks gestational age who presents for evaluation of vaginal discharge.  Pelvic exam normal showing no CMT, no discharge, closed os.  Abdomen soft with no tenderness.  Wet prep, GC and chlamydia sent.  Bedside ultrasound showing normal fetal activity with fetal heart rate of 152.   ED COURSE: wet prep, Gc/chlamydia negative. Patient dc with f/u with ObGYN.  As part of my medical decision making, I reviewed the following data within the electronic MEDICAL RECORD NUMBER Nursing notes reviewed and incorporated, Labs reviewed , Old chart reviewed, Notes from prior ED visits and [place] Controlled Substance Database  Pertinent labs & imaging results that were available during my care of the patient were reviewed by me and considered in my medical decision making (see chart for details).    ____________________________________________   FINAL CLINICAL IMPRESSION(S) / ED DIAGNOSES  Final diagnoses:  Vaginal discharge      NEW MEDICATIONS STARTED DURING THIS VISIT:  ED Discharge Orders         Ordered    metroNIDAZOLE (FLAGYL) 500 MG tablet  2 times daily,   Status:  Discontinued     08/06/18 1903           Note:  This document was prepared using Dragon voice recognition software and may include unintentional dictation errors.    Don Perking, [place], MD 08/06/18 Avanell Shackleton    Don Perking, [place], MD 08/07/18 [phone]

## 2018-08-06 NOTE — ED Notes (Signed)
Pt is ambulatory to POV without difficulty. VSS. NAD. Discharge instructions, RX and follow up discussed. All questions and concerns addressed.

## 2018-08-06 NOTE — ED Triage Notes (Addendum)
Pt states [redacted] weeks pregnant. Had some "brown mucous stuff" today. States "2 drops of blood" which she states is normal for her during pregnancy. A&O, ambulatory. Anxious. Sees [facility] for OB care.

## 2018-08-14 ENCOUNTER — Encounter: Payer: Self-pay | Admitting: Obstetrics and Gynecology

## 2018-08-14 ENCOUNTER — Ambulatory Visit (INDEPENDENT_AMBULATORY_CARE_PROVIDER_SITE_OTHER): Payer: 59 | Admitting: Obstetrics and Gynecology

## 2018-08-14 VITALS — BP 114/68 | Wt 229.5 lb

## 2018-08-14 DIAGNOSIS — Z3A13 13 weeks gestation of pregnancy: Secondary | ICD-10-CM

## 2018-08-14 DIAGNOSIS — O09291 Supervision of pregnancy with other poor reproductive or obstetric history, first trimester: Secondary | ICD-10-CM

## 2018-08-14 DIAGNOSIS — O99211 Obesity complicating pregnancy, first trimester: Secondary | ICD-10-CM

## 2018-08-14 DIAGNOSIS — O09299 Supervision of pregnancy with other poor reproductive or obstetric history, unspecified trimester: Secondary | ICD-10-CM

## 2018-08-14 DIAGNOSIS — O0990 Supervision of high risk pregnancy, unspecified, unspecified trimester: Secondary | ICD-10-CM

## 2018-08-14 MED ORDER — FOLIC ACID 1 MG PO TABS: 1.0000 mg | ORAL_TABLET | Freq: Every day | ORAL | 10 refills | Status: DC

## 2018-08-14 MED ORDER — ASPIRIN EC 81 MG PO TBEC: 81.0000 mg | DELAYED_RELEASE_TABLET | Freq: Every day | ORAL | 2 refills | Status: DC

## 2018-08-14 NOTE — Progress Notes (Signed)
ER follow up  Vaginal spotting for a few hours, stomach is hurting the entire pregnancy

## 2018-08-14 NOTE — Progress Notes (Signed)
Routine Prenatal Care Visit  Subjective  Kimberly Farmer is a 22 y.o. G3P1011 at [redacted]w[redacted]d being seen today for ongoing prenatal care.  She is currently monitored for the following issues for this high-risk pregnancy and has Airway hyperreactivity; F/H of alcoholism; Broken leg; Osteochondritis dissecans of bilateral knees; Bone disease; Patellar instability; Chronic midline low back pain without sciatica; BMI 37.0-37.9, adult; PCOS (polycystic ovarian syndrome); Breakthrough bleeding on birth control pills; Supervision of high risk pregnancy, antepartum; History of pre-eclampsia in prior pregnancy, currently pregnant; and Obesity affecting pregnancy on their problem list.  ----------------------------------------------------------------------------------- Patient reports she has had some small discharge. She feels like bleeding and discharge are improved since her visit in the ER> She does not have itching or pain. She was negative for infection in the ER> .   Contractions: Not present. Vag. Bleeding: None.   . Denies leaking of fluid.  ----------------------------------------------------------------------------------- The following portions of the patient's history were reviewed and updated as appropriate: allergies, current medications, past family history, past medical history, past social history, past surgical history and problem list. Problem list updated.   Objective  Blood pressure 114/68, weight 229 lb 8 oz (104.1 kg), last menstrual period 05/12/2018, not currently breastfeeding. Pregravid weight 225 lb (102.1 kg) Total Weight Gain 4 lb 8 oz (2.041 kg) Urinalysis:      Fetal Status: Fetal Heart Rate (bpm): 156         General:  Alert, oriented and cooperative. Patient is in no acute distress.  Skin: Skin is warm and dry. No rash noted.   Cardiovascular: Normal heart rate noted  Respiratory: Normal respiratory effort, no problems with respiration noted  Abdomen: Soft, gravid,  appropriate for gestational age. Pain/Pressure: Absent     Pelvic:  Cervical exam deferred        Extremities: Normal range of motion.     Mental Status: Normal mood and affect. Normal behavior. Normal judgment and thought content.     Assessment   22 y.o. G3P1011 at [redacted]w[redacted]d by  02/16/2019, by Last Menstrual Period presenting for routine prenatal visit  Plan   pregnancy 3 Problems (from 04/08/18 to present)    Problem Noted Resolved   Obesity affecting pregnancy 06/25/2018 by Conard Novak, MD No   Supervision of high risk pregnancy, antepartum 06/21/2018 by Nadara Mustard, MD No   Overview Addendum 08/14/2018  2:02 PM by Natale Milch, MD    Clinic [facility] Prenatal Labs  Dating LMP=6wk [place] Blood type: O/Positive/-- (09/06 1006)   Genetic Screen 1 Screen: Negative   AFP:     Antibody:Negative (09/06 1006)  Anatomic [place]  Rubella: <0.90 NON Immune Varicella:Immune   GTT Early:  98              Third trimester:  RPR: Non Reactive (09/06 1006)   Rhogam na HBsAg: Negative (09/06 1006)   TDaP vaccine                        Flu Shot:08/01/18 HIV: Non Reactive (09/06 1006)   Baby Food                                GBS:   Contraception  Pap: 2019 NIL  CBB     CS/VBAC    Support Person            History of pre-eclampsia in prior pregnancy, currently  pregnant 06/21/2018 by Nadara Mustard, MD No   BMI 37.0-37.9, adult 10/11/2016 by  Bing, MD No       Gestational age appropriate obstetric precautions including but not limited to vaginal bleeding, contractions, leaking of fluid and fetal movement were reviewed in detail with the patient.    Return for ROb as scheduled.  Natale Milch MD [facility], [facility] 08/14/2018, 2:06 PM

## 2018-08-27 ENCOUNTER — Encounter: Payer: Self-pay | Admitting: Obstetrics and Gynecology

## 2018-08-27 ENCOUNTER — Ambulatory Visit (INDEPENDENT_AMBULATORY_CARE_PROVIDER_SITE_OTHER): Payer: 59 | Admitting: Obstetrics and Gynecology

## 2018-08-27 VITALS — BP 112/68 | Wt 229.0 lb

## 2018-08-27 DIAGNOSIS — O09292 Supervision of pregnancy with other poor reproductive or obstetric history, second trimester: Secondary | ICD-10-CM

## 2018-08-27 DIAGNOSIS — O0990 Supervision of high risk pregnancy, unspecified, unspecified trimester: Secondary | ICD-10-CM

## 2018-08-27 DIAGNOSIS — O09299 Supervision of pregnancy with other poor reproductive or obstetric history, unspecified trimester: Secondary | ICD-10-CM

## 2018-08-27 DIAGNOSIS — Z3A15 15 weeks gestation of pregnancy: Secondary | ICD-10-CM

## 2018-08-27 LAB — POCT URINALYSIS DIPSTICK OB: Glucose, UA: NEGATIVE

## 2018-08-27 NOTE — Progress Notes (Signed)
Routine Prenatal Care Visit  Subjective  Kimberly Farmer is a 22 y.o. G3P1011 at 10768w2d being seen today for ongoing prenatal care.  She is currently monitored for the following issues for this high-risk pregnancy and has Airway hyperreactivity; F/H of alcoholism; Broken leg; Osteochondritis dissecans of bilateral knees; Bone disease; Patellar instability; Chronic midline low back pain without sciatica; BMI 37.0-37.9, adult; PCOS (polycystic ovarian syndrome); Breakthrough bleeding on birth control pills; Supervision of high risk pregnancy, antepartum; History of pre-eclampsia in prior pregnancy, currently pregnant; and Obesity affecting pregnancy on their problem list.  ----------------------------------------------------------------------------------- Patient reports that yesterday she felt a crampy contraction pain but today she feels better.  Denies abnormal discharge. Denies dysuria. Denies vaginal bleeding.  Contractions: Not present. Vag. Bleeding: None.   . Denies leaking of fluid.  ----------------------------------------------------------------------------------- The following portions of the patient's history were reviewed and updated as appropriate: allergies, current medications, past family history, past medical history, past social history, past surgical history and problem list. Problem list updated.   Objective  Blood pressure 112/68, weight 229 lb (103.9 kg), last menstrual period 05/12/2018, not currently breastfeeding. Pregravid weight 225 lb (102.1 kg) Total Weight Gain 4 lb (1.814 kg) Body mass index is 36.96 kg/m.  Urinalysis:      Fetal Status: Fetal Heart Rate (bpm): 141         General:  Alert, oriented and cooperative. Patient is in no acute distress.  Skin: Skin is warm and dry. No rash noted.   Cardiovascular: Normal heart rate noted  Respiratory: Normal respiratory effort, no problems with respiration noted  Abdomen: Soft, gravid, appropriate for  gestational age. Pain/Pressure: Absent     Pelvic:  Cervical exam deferred        Extremities: Normal range of motion.     Mental Status: Normal mood and affect. Normal behavior. Normal judgment and thought content.     Assessment   22 y.o. G3P1011 at 6068w2d by  02/16/2019, by Last Menstrual Period presenting for routine prenatal visit  Plan   pregnancy 3 Problems (from 04/08/18 to present)    Problem Noted Resolved   Obesity affecting pregnancy 06/25/2018 by Conard NovakJackson, Stephen D, MD No   Supervision of high risk pregnancy, antepartum 06/21/2018 by Nadara MustardHarris, Robert P, MD No   Overview Addendum 08/14/2018  2:02 PM by Natale MilchSchuman,  R, MD    Clinic [facility] Prenatal Labs  Dating LMP=6wk US Blood type: O/Positive/-- (09/06 1006)   Genetic Screen 1 Screen: Negative   AFP:     Antibody:Negative (09/06 1006)  Anatomic US  Rubella: <0.90 NON Immune Varicella:Immune   GTT Early:  98              Third trimester:  RPR: Non Reactive (09/06 1006)   Rhogam na HBsAg: Negative (09/06 1006)   TDaP vaccine                        Flu Shot:08/01/18 HIV: Non Reactive (09/06 1006)   Baby Food                                GBS:   Contraception  Pap: 2019 NIL  CBB     CS/VBAC    Support Person            History of pre-eclampsia in prior pregnancy, currently pregnant 06/21/2018 by Nadara MustardHarris, Robert P, MD No   BMI 37.0-37.9,  adult 10/11/2016 by [place] Bing, MD No       Gestational age appropriate obstetric precautions including but not limited to vaginal bleeding, contractions, leaking of fluid and fetal movement were reviewed in detail with the patient.    Encouraged her to start taking ASA, she has not yet because it will cost $12.00 to pick up the prescription  Return in about 4 weeks (around 09/24/2018) for ROB and [place].  Natale Milch MD [facility], [facility] 08/27/2018, 8:41 AM

## 2018-08-27 NOTE — Progress Notes (Signed)
ROB  Early braxton hicks possibly yesderday

## 2018-08-29 ENCOUNTER — Encounter: Payer: 59 | Admitting: Advanced Practice Midwife

## 2018-09-17 ENCOUNTER — Ambulatory Visit (INDEPENDENT_AMBULATORY_CARE_PROVIDER_SITE_OTHER): Payer: 59 | Admitting: Obstetrics and Gynecology

## 2018-09-17 ENCOUNTER — Ambulatory Visit (INDEPENDENT_AMBULATORY_CARE_PROVIDER_SITE_OTHER): Payer: 59

## 2018-09-17 VITALS — BP 122/80 | Wt 238.0 lb

## 2018-09-17 DIAGNOSIS — O3503X0 Maternal care for (suspected) central nervous system malformation or damage in fetus, choroid plexus cysts, not applicable or unspecified: Secondary | ICD-10-CM | POA: Insufficient documentation

## 2018-09-17 DIAGNOSIS — O350XX0 Maternal care for (suspected) central nervous system malformation in fetus, not applicable or unspecified: Secondary | ICD-10-CM | POA: Insufficient documentation

## 2018-09-17 DIAGNOSIS — O99212 Obesity complicating pregnancy, second trimester: Secondary | ICD-10-CM

## 2018-09-17 DIAGNOSIS — O09292 Supervision of pregnancy with other poor reproductive or obstetric history, second trimester: Secondary | ICD-10-CM

## 2018-09-17 DIAGNOSIS — O99211 Obesity complicating pregnancy, first trimester: Secondary | ICD-10-CM

## 2018-09-17 DIAGNOSIS — Z363 Encounter for antenatal screening for malformations: Secondary | ICD-10-CM | POA: Diagnosis not present

## 2018-09-17 DIAGNOSIS — O09299 Supervision of pregnancy with other poor reproductive or obstetric history, unspecified trimester: Secondary | ICD-10-CM

## 2018-09-17 DIAGNOSIS — Z3A18 18 weeks gestation of pregnancy: Secondary | ICD-10-CM

## 2018-09-17 DIAGNOSIS — IMO0002 Reserved for concepts with insufficient information to code with codable children: Secondary | ICD-10-CM

## 2018-09-17 DIAGNOSIS — Z0489 Encounter for examination and observation for other specified reasons: Secondary | ICD-10-CM

## 2018-09-17 DIAGNOSIS — M25369 Other instability, unspecified knee: Secondary | ICD-10-CM

## 2018-09-17 DIAGNOSIS — O0990 Supervision of high risk pregnancy, unspecified, unspecified trimester: Secondary | ICD-10-CM

## 2018-09-17 HISTORY — DX: Maternal care for (suspected) central nervous system malformation in fetus, not applicable or unspecified: O35.0XX0

## 2018-09-17 HISTORY — DX: Maternal care for (suspected) central nervous system malformation or damage in fetus, choroid plexus cysts, not applicable or unspecified: O35.03X0

## 2018-09-17 LAB — POCT URINALYSIS DIPSTICK OB
Glucose, UA: NEGATIVE
POC,PROTEIN,UA: NEGATIVE

## 2018-09-17 NOTE — Progress Notes (Signed)
ROB Anatomy Scan/ It is a boy 

## 2018-09-18 NOTE — Progress Notes (Signed)
Routine Prenatal Care Visit  Subjective  Kimberly Farmer is a 22 y.o. G3P1011 at 2566w3d being seen today for ongoing prenatal care.  She is currently monitored for the following issues for this low-risk pregnancy and has Airway hyperreactivity; F/H of alcoholism; Broken leg; Osteochondritis dissecans of bilateral knees; Bone disease; Patellar instability; Chronic midline low back pain without sciatica; BMI 37.0-37.9, adult; PCOS (polycystic ovarian syndrome); Breakthrough bleeding on birth control pills; Supervision of high risk pregnancy, antepartum; History of pre-eclampsia in prior pregnancy, currently pregnant; Obesity affecting pregnancy; and Choroid plexus cysts, fetal, affecting care of mother, antepartum on their problem list.  ----------------------------------------------------------------------------------- Patient reports no complaints.   Contractions: Not present. Vag. Bleeding: None.  Movement: Present. Denies leaking of fluid.  ----------------------------------------------------------------------------------- The following portions of the patient's history were reviewed and updated as appropriate: allergies, current medications, past family history, past medical history, past social history, past surgical history and problem list. Problem list updated.   Objective  Blood pressure 122/80, weight 238 lb (108 kg), last menstrual period 05/12/2018, not currently breastfeeding. Pregravid weight 225 lb (102.1 kg) Total Weight Gain 13 lb (5.897 kg) Urinalysis:      Fetal Status: Fetal Heart Rate (bpm): 150   Movement: Present     General:  Alert, oriented and cooperative. Patient is in no acute distress.  Skin: Skin is warm and dry. No rash noted.   Cardiovascular: Normal heart rate noted  Respiratory: Normal respiratory effort, no problems with respiration noted  Abdomen: Soft, gravid, appropriate for gestational age. Pain/Pressure: Absent     Pelvic:  Cervical exam deferred         Extremities: Normal range of motion.     ental Status: Normal mood and affect. Normal behavior. Normal judgment and thought content.   [place] Ob Comp + 14 Wk  Result Date: 09/17/2018 Patient Name: Kimberly Farmer DOB: 09/25/1996 MRN: [id number] ULTRASOUND REPORT Location: [facility] Date of Service: 09/17/2018 Indications:Anatomy Ultrasound Findings: Mason JimSingleton intrauterine pregnancy is visualized with FHR at 144 BPM. Biometrics give an (U/S) Gestational age of 183w2d and an (U/S) EDD of 02/16/2019; this correlates with the clinically established Estimated Date of Delivery: 02/16/19 Fetal presentation is Cephalic. EFW: 227g (8oz). Placenta: posterior. Grade: 0 AFI: subjectively normal. Anatomic survey is incomplete for nose/lips and cardiac views; Gender - female.  Right Ovary is normal in appearance. Left Ovary is normal appearance. Survey of the adnexa demonstrates no adnexal masses. There is no free peritoneal fluid in the cul de sac. Impression: 1. 1783w2d Viable Singleton Intrauterine pregnancy by U/S. 2. (U/S) EDD is consistent with Clinically established Estimated Date of Delivery: 02/16/19 . 3. Bilateral choroid plexus cysts seen. 4. Follow up nose/lips and cardiac views. Recommendations: 1.Clinical correlation with the patient's History and Physical Exam. Darlina GuysAbby M Clarke, RDMS RVT  There is a singleton gestation with subjectively normal amniotic fluid volume. The fetal biometry correlates with established dating. Detailed evaluation of the fetal anatomy was performed.The fetal anatomical survey appears within normal limits within the resolution of ultrasound as described above.  Choroid plexus cysts are identified in approximately 1% to 2% of fetuses in the second trimester and they occur equally in female and female fetuses. The only association of some significance between an isolated choroid plexus cyst and a possible fetal problem is with trisomy 8718. When a fetus is affected by trisomy 3518, multiple  structural anomalies are almost always evident, including structural heart defects, clenched hands, talipes deformity of the feet, growth restriction,  and polyhydramnios. When a structural anomaly is present in addition to choroid plexus cysts, the probability of trisomy 18 is 37%. In the absence of associated sonographic abnormalities, the likelihood of trisomy 30 is extremely low in otherwise low-risk pregnancies. The patient had negative first trimester screening. It must be noted that a normal ultrasound is unable to rule out fetal aneuploidy.  Vena Austria, MD, FACOG [facility], [facility] 09/17/2018, 4:00 PM     Assessment   22 y.o. G3P1011 at [redacted]w[redacted]d by  02/16/2019, by Last Menstrual Period presenting for routine prenatal visit  Plan   pregnancy 3 Problems (from 04/08/18 to present)    Problem Noted Resolved   Obesity affecting pregnancy 06/25/2018 by Conard Novak, MD No   Supervision of high risk pregnancy, antepartum 06/21/2018 by Nadara Mustard, MD No   Overview Addendum 08/14/2018  2:02 PM by Natale Milch, MD    Clinic [facility] Prenatal Labs  Dating LMP=6wk [place] Blood type: O/Positive/-- (09/06 1006)   Genetic Screen 1 Screen: Negative      Antibody:Negative (09/06 1006)  Anatomic [place] Incomplete face/profile, bilateral chorid plexus cyst Rubella: <0.90 NON Immune Varicella:Immune   GTT Early:  98              Third trimester:  RPR: Non Reactive (09/06 1006)   Rhogam na HBsAg: Negative (09/06 1006)   TDaP vaccine                        Flu Shot:08/01/18 HIV: Non Reactive (09/06 1006)   Baby Food                                GBS:   Contraception  Pap: 2019 NIL  CBB     CS/VBAC    Support Person            History of pre-eclampsia in prior pregnancy, currently pregnant 06/21/2018 by Nadara Mustard, MD No   Overview Signed 08/27/2018  8:35 AM by Natale Milch, MD    [ ]   ASA prescription sent, but patient has not started taking it  yet because of the expense.       BMI 37.0-37.9, adult 10/11/2016 by Adrian Bing, MD No       Gestational age appropriate obstetric precautions including but not limited to vaginal bleeding, contractions, leaking of fluid and fetal movement were reviewed in detail with the patient.    Return in about 4 weeks (around 10/15/2018) for ROB and follow up anatomy scan.  Vena Austria, MD, [facility], [facility]

## 2018-09-24 ENCOUNTER — Other Ambulatory Visit: Payer: 59

## 2018-09-24 ENCOUNTER — Encounter: Payer: 59 | Admitting: Obstetrics and Gynecology

## 2018-10-04 ENCOUNTER — Emergency Department
Admission: EM | Admit: 2018-10-04 | Discharge: 2018-10-04 | Disposition: A | Discharge status: Home / Self Care | Payer: Medicaid Other | Attending: Emergency Medicine | Admitting: Emergency Medicine

## 2018-10-04 ENCOUNTER — Encounter: Payer: Self-pay | Admitting: Emergency Medicine

## 2018-10-04 ENCOUNTER — Other Ambulatory Visit: Payer: Self-pay

## 2018-10-04 DIAGNOSIS — Z3A21 21 weeks gestation of pregnancy: Secondary | ICD-10-CM | POA: Diagnosis not present

## 2018-10-04 DIAGNOSIS — E860 Dehydration: Secondary | ICD-10-CM | POA: Diagnosis not present

## 2018-10-04 DIAGNOSIS — O26892 Other specified pregnancy related conditions, second trimester: Secondary | ICD-10-CM | POA: Diagnosis not present

## 2018-10-04 DIAGNOSIS — Z87891 Personal history of nicotine dependence: Secondary | ICD-10-CM | POA: Insufficient documentation

## 2018-10-04 DIAGNOSIS — Z7982 Long term (current) use of aspirin: Secondary | ICD-10-CM | POA: Insufficient documentation

## 2018-10-04 DIAGNOSIS — Z79899 Other long term (current) drug therapy: Secondary | ICD-10-CM | POA: Diagnosis not present

## 2018-10-04 DIAGNOSIS — Z3492 Encounter for supervision of normal pregnancy, unspecified, second trimester: Secondary | ICD-10-CM

## 2018-10-04 DIAGNOSIS — R42 Dizziness and giddiness: Secondary | ICD-10-CM | POA: Diagnosis not present

## 2018-10-04 LAB — URINALYSIS, COMPLETE (UACMP) WITH MICROSCOPIC
Bilirubin Urine: NEGATIVE
Glucose, UA: NEGATIVE mg/dL
Hgb urine dipstick: NEGATIVE
Ketones, ur: NEGATIVE mg/dL
Nitrite: NEGATIVE
Protein, ur: NEGATIVE mg/dL
Specific Gravity, Urine: 1.017 (ref 1.005–1.030)
pH: 6 (ref 5.0–8.0)

## 2018-10-04 LAB — BASIC METABOLIC PANEL
Anion gap: 9 (ref 5–15)
BUN: 5 mg/dL — ABNORMAL LOW (ref 6–20)
CO2: 22 mmol/L (ref 22–32)
Calcium: 8.8 mg/dL — ABNORMAL LOW (ref 8.9–10.3)
Chloride: 105 mmol/L (ref 98–111)
Creatinine, Ser: 0.41 mg/dL — ABNORMAL LOW (ref 0.44–1.00)
GFR calc Af Amer: >60 mL/min (ref >60)
GFR calc non Af Amer: >60 mL/min (ref >60)
Glucose, Bld: 96 mg/dL (ref 70–99)
Potassium: 3.6 mmol/L (ref 3.5–5.1)
Sodium: 136 mmol/L (ref 135–145)

## 2018-10-04 LAB — CBC
HCT: 37.7 % (ref 36.0–46.0)
Hemoglobin: 12.2 g/dL (ref 12.0–15.0)
MCH: 26.6 pg (ref 26.0–34.0)
MCHC: 32.4 g/dL (ref 30.0–36.0)
MCV: 82.3 fL (ref 80.0–100.0)
Platelets: 274 10*3/uL (ref 150–400)
RBC: 4.58 MIL/uL (ref 3.87–5.11)
RDW: 14.4 % (ref 11.5–15.5)
WBC: 10.4 10*3/uL (ref 4.0–10.5)
nRBC: 0 % (ref 0.0–0.2)

## 2018-10-04 LAB — HCG, QUANTITATIVE, PREGNANCY: hCG, Beta Chain, Quant, S: 12486 m[IU]/mL — ABNORMAL HIGH (ref <5)

## 2018-10-04 NOTE — ED Triage Notes (Signed)
Pt arrives POV to triage with c/o dizziness x 1 day where she has felt some numbness in her hands. Pt is talking in complete sentences in triage and is in NAD.

## 2018-10-04 NOTE — ED Provider Notes (Signed)
[facility] Emergency Department Provider Note  ____________________________________________  Time seen: Approximately 9:06 PM  I have reviewed the triage vital signs and the nursing notes.   HISTORY  Chief Complaint Dizziness    HPI Kimberly Farmer is a 22 y.o. female with a past history of PCOS, obesity, and currently about [redacted] weeks pregnant, following up at [facility], who complains of dizziness that started this morning, gradual onset, constant, feeling sluggish and cognitively slowed.  No falls or trauma.  No chest pain shortness of breath cough fevers chills sweats.  Does report that she has had decreased appetite and not eating or drinking that much over the past several days.  Also she works in a [employer] where they are using a lot of volatile organic chemicals with heavy fumes, that she breathes a lot of while walking around the shop.  Also because of recent cold weather, doors have been closed and there is less ventilation.  Patient feels better now here in the ED.  Denies any current complaints.  Has her next appointment with [facility] on October 14, 2018.   Denies any vaginal bleeding, leakage of fluid, decreased fetal movements or feeling contractions.     Past Medical History:  Diagnosis Date  . Arthritis    Bilateral knees and back  . Lumbar disc disease   . PCOS (polycystic ovarian syndrome)      Patient Active Problem List   Diagnosis Date Noted  . Choroid plexus cysts, fetal, affecting care of mother, antepartum 09/17/2018  . Obesity affecting pregnancy 06/25/2018  . Supervision of high risk pregnancy, antepartum 06/21/2018  . History of pre-eclampsia in prior pregnancy, currently pregnant 06/21/2018  . Breakthrough bleeding on birth control pills 05/16/2018  . PCOS (polycystic ovarian syndrome) 03/05/2018  . BMI 37.0-37.9, adult 10/11/2016  . Chronic midline low back pain without sciatica 04/24/2016  . Airway hyperreactivity  09/27/2015  . F/H of alcoholism 09/27/2015  . Broken leg 09/27/2015  . Bone disease 09/24/2014  . 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     Past Surgical History:  Procedure Laterality Date  . KNEE SURGERY Left 2011   Kimberly Farmer has had 5 surgeries on her knee  . TONSILLECTOMY AND ADENOIDECTOMY  2007     Prior to Admission medications   Medication Sig Start Date End Date Taking? Authorizing Provider  aspirin EC 81 MG tablet Take 1 tablet (81 mg total) by mouth daily. Take after 12 weeks for prevention of preeclampssia later in pregnancy 08/14/18   Natale Milch, MD  cyclobenzaprine (FLEXERIL) 5 MG tablet Take 1 tablet (5 mg total) by mouth 3 (three) times daily as needed for muscle spasms. 06/10/18   Margaretann Loveless, PA-C  folic acid (FOLVITE) 1 MG tablet Take 1 tablet (1 mg total) by mouth daily. 08/14/18   Schuman, Jaquelyn Bitter, MD  Prenatal MV & Min w/FA-DHA (ONE A DAY PRENATAL PO) Take by mouth.    [provider]  topiramate (TOPAMAX) 50 MG tablet START WITH 1 TABLET BY MOUTH DAILY FOR 1 WEEK, THEN INCREASE TO 1 TABLET BY MOUTH TWICE A DAY 06/12/18   Margaretann Loveless, PA-C     Allergies Hydrocodone; Oxycodone; and Tape   Family History  Problem Relation Age of Onset  . Hypertension Mother   . Alcohol abuse Father   . Diabetes Maternal Grandmother   . Hypertension Maternal Grandmother   . Cancer Paternal Grandfather  skin    Social History Social History   Tobacco Use  . Smoking status: Former Smoker    Types: E-cigarettes  . Smokeless tobacco: Former NeurosurgeonUser  . Tobacco comment: Vape - Reduced Nicotine  Substance Use Topics  . Alcohol use: No  . Drug use: No    Review of Systems  Constitutional:   No fever or chills.  ENT:   No sore throat. No rhinorrhea. Cardiovascular:   No chest pain or syncope. Respiratory:   No dyspnea or cough. Gastrointestinal:   Negative for abdominal pain,  vomiting and diarrhea.  Musculoskeletal:   Negative for focal pain or swelling All other systems reviewed and are negative except as documented above in ROS and HPI.  ____________________________________________   PHYSICAL EXAM:  VITAL SIGNS: ED Triage Vitals  Enc Vitals Group     BP 10/04/18 1903 123/71     Pulse Rate 10/04/18 1903 89     Resp 10/04/18 1903 20     Temp 10/04/18 1903 98.5 F (36.9 C)     Temp Source 10/04/18 1903 Oral     SpO2 10/04/18 1903 98 %     Weight 10/04/18 1905 234 lb (106.1 kg)     Height 10/04/18 1905 5\' 6"  (1.676 m)     Head Circumference --      Peak Flow --      Pain Score 10/04/18 1905 0     Pain Loc --      Pain Edu? --      Excl. in GC? --     Vital signs reviewed, nursing assessments reviewed.   Constitutional:   Alert and oriented. Non-toxic appearance. Eyes:   Conjunctivae are normal. EOMI. PERRL. ENT      Head:   Normocephalic and atraumatic.      Nose:   No congestion/rhinnorhea.       Mouth/Throat:   MMM, no pharyngeal erythema. No peritonsillar mass.       Neck:   No meningismus. Full ROM. Hematological/Lymphatic/Immunilogical:   No cervical lymphadenopathy. Cardiovascular:   RRR. Symmetric bilateral radial and DP pulses.  No murmurs. Cap refill less than 2 seconds. Respiratory:   Normal respiratory effort without tachypnea/retractions. Breath sounds are clear and equal bilaterally. No wheezes/rales/rhonchi. Gastrointestinal:   Soft and nontender.  Gravid abdomen. There is no CVA tenderness.  No rebound, rigidity, or guarding. Musculoskeletal:   Normal range of motion in all extremities. No joint effusions.  No lower extremity tenderness.  No edema. Neurologic:   Normal speech and language.  Motor grossly intact. No acute focal neurologic deficits are appreciated.  Skin:    Skin is warm, dry and intact. No rash noted.  No petechiae, purpura, or bullae.  ____________________________________________    LABS (pertinent  positives/negatives) (all labs ordered are listed, but only abnormal results are displayed) Labs Reviewed  BASIC METABOLIC PANEL - Abnormal; Notable for the following components:      Result Value   BUN 5 (*)    Creatinine, Ser 0.41 (*)    Calcium 8.8 (*)    All other components within normal limits  URINALYSIS, COMPLETE (UACMP) WITH MICROSCOPIC - Abnormal; Notable for the following components:   Color, Urine YELLOW (*)    APPearance HAZY (*)    Leukocytes, UA TRACE (*)    Bacteria, UA RARE (*)    All other components within normal limits  HCG, QUANTITATIVE, PREGNANCY - Abnormal; Notable for the following components:   hCG, Beta Chain, Quant, S 12,486 (*)  All other components within normal limits  CBC  CBG MONITORING, ED   ____________________________________________   EKG  Interpreted by me Normal sinus rhythm rate of 86, normal axis and intervals.  Normal QRS ST segments.  Isolated T wave inversion in lead III which is nonspecific.Marland Kitchen.  No acute ischemic changes.  ____________________________________________    RADIOLOGY  No results found.  ____________________________________________   PROCEDURES Procedures  ____________________________________________    CLINICAL IMPRESSION / ASSESSMENT AND PLAN / ED COURSE  Pertinent labs & imaging results that were available during my care of the patient were reviewed by me and considered in my medical decision making (see chart for details).    Patient presents with dizziness that is now resolved, likely due to combination of dehydration which she is particularly susceptible to at this point in her pregnancy as well as exposure to volatile organic compounds.  EKG is nonischemic, not consistent with right heart strain.  I do not think she has a PE or infectious process.  Her labs are unremarkable, no evidence of urinary tract infection.  No symptoms or exam findings to suggest that she has neurologic, cardiac, or vascular  disease causing the symptoms.  Counseled to increase eating and fluid intake, avoid heavy fumes, seek ventilation.  Continue follow-up and prenatals..      ____________________________________________   FINAL CLINICAL IMPRESSION(S) / ED DIAGNOSES    Final diagnoses:  Dizziness  Dehydration  Second trimester pregnancy     ED Discharge Orders    None      Portions of this note were generated with dragon dictation software. Dictation errors may occur despite best attempts at proofreading.   Sharman CheekStafford, Collyn Ribas, MD 10/04/18 2110

## 2018-10-15 ENCOUNTER — Ambulatory Visit (INDEPENDENT_AMBULATORY_CARE_PROVIDER_SITE_OTHER): Payer: 59 | Admitting: Maternal Newborn

## 2018-10-15 ENCOUNTER — Ambulatory Visit (INDEPENDENT_AMBULATORY_CARE_PROVIDER_SITE_OTHER): Payer: 59

## 2018-10-15 ENCOUNTER — Encounter: Payer: Self-pay | Admitting: Maternal Newborn

## 2018-10-15 VITALS — BP 120/80 | Wt 240.0 lb

## 2018-10-15 DIAGNOSIS — O350XX0 Maternal care for (suspected) central nervous system malformation in fetus, not applicable or unspecified: Secondary | ICD-10-CM | POA: Diagnosis not present

## 2018-10-15 DIAGNOSIS — IMO0002 Reserved for concepts with insufficient information to code with codable children: Secondary | ICD-10-CM

## 2018-10-15 DIAGNOSIS — O09292 Supervision of pregnancy with other poor reproductive or obstetric history, second trimester: Secondary | ICD-10-CM | POA: Diagnosis not present

## 2018-10-15 DIAGNOSIS — O99212 Obesity complicating pregnancy, second trimester: Secondary | ICD-10-CM

## 2018-10-15 DIAGNOSIS — O0990 Supervision of high risk pregnancy, unspecified, unspecified trimester: Secondary | ICD-10-CM

## 2018-10-15 DIAGNOSIS — Z3A22 22 weeks gestation of pregnancy: Secondary | ICD-10-CM | POA: Diagnosis not present

## 2018-10-15 DIAGNOSIS — O09299 Supervision of pregnancy with other poor reproductive or obstetric history, unspecified trimester: Secondary | ICD-10-CM

## 2018-10-15 DIAGNOSIS — O99211 Obesity complicating pregnancy, first trimester: Secondary | ICD-10-CM

## 2018-10-15 DIAGNOSIS — Z0489 Encounter for examination and observation for other specified reasons: Secondary | ICD-10-CM

## 2018-10-15 DIAGNOSIS — O3503X0 Maternal care for (suspected) central nervous system malformation or damage in fetus, choroid plexus cysts, not applicable or unspecified: Secondary | ICD-10-CM

## 2018-10-15 NOTE — Progress Notes (Signed)
Routine Prenatal Care Visit  Subjective  Kimberly Farmer is a 22 y.o. G3P1011 at 2080w2d being seen today for ongoing prenatal care.  She is currently monitored for the following issues for this high-risk pregnancy and has Airway hyperreactivity; F/H of alcoholism; Broken leg; Osteochondritis dissecans of bilateral knees; Bone disease; Patellar instability; Chronic midline low back pain without sciatica; BMI 37.0-37.9, adult; PCOS (polycystic ovarian syndrome); Breakthrough bleeding on birth control pills; Supervision of high risk pregnancy, antepartum; History of pre-eclampsia in prior pregnancy, currently pregnant; Obesity affecting pregnancy; and Choroid plexus cysts, fetal, affecting care of mother, antepartum on their problem list.  ----------------------------------------------------------------------------------- Patient reports an occasional pain in her lower abdomen on the right side. It is not severe and only happens when she moves in a certain manner. Likely musculoskeletal in origin.   Contractions: Not present. Vag. Bleeding: None.  Movement: Present. No leaking of fluid.  ----------------------------------------------------------------------------------- The following portions of the patient's history were reviewed and updated as appropriate: allergies, current medications, past family history, past medical history, past social history, past surgical history and problem list. Problem list updated.  Objective  Blood pressure 120/80, weight 240 lb (108.9 kg), last menstrual period 05/12/2018, not currently breastfeeding. Pregravid weight 225 lb (102.1 kg) Total Weight Gain 15 lb (6.804 kg) Body mass index is 38.74 kg/m.   Fetal Status: Fetal Heart Rate (bpm): 148 Fundal Height: 23 cm Movement: Present     General:  Alert, oriented and cooperative. Patient is in no acute distress.  Skin: Skin is warm and dry. No rash noted.   Cardiovascular: Normal heart rate noted  Respiratory:  Normal respiratory effort, no problems with respiration noted  Abdomen: Soft, gravid, appropriate for gestational age. Pain/Pressure: Absent     Pelvic:  Cervical exam deferred        Extremities: Normal range of motion.     Mental Status: Normal mood and affect. Normal behavior. Normal judgment and thought content.    Assessment   22 y.o. G3P1011 at 5280w2d, EDD 02/16/2019 by Last Menstrual Period presenting for a routine prenatal visit.  Plan   pregnancy 3 Problems (from 04/08/18 to present)    Problem Noted Resolved   Obesity affecting pregnancy 06/25/2018 by Conard NovakJackson, Stephen D, MD No   Supervision of high risk pregnancy, antepartum 06/21/2018 by Nadara MustardHarris, Robert P, MD No   Overview Addendum 09/18/2018  1:15 PM by Vena AustriaStaebler, Andreas, MD    Clinic [facility] Prenatal Labs  Dating LMP=6wk US Blood type: O/Positive/-- (09/06 1006)   Genetic Screen 1 Screen: Negative   AFP:     Antibody:Negative (09/06 1006)  Anatomic US Incomplete face/profile, bilateral chorid plexus cyst (Female) Rubella: <0.90 NON Immune Varicella:Immune   GTT Early:  398              Third trimester:  RPR: Non Reactive (09/06 1006)   Rhogam na HBsAg: Negative (09/06 1006)   TDaP vaccine                        Flu Shot:08/01/18 HIV: Non Reactive (09/06 1006)   Baby Food                                GBS:   Contraception  Pap: 2019 NIL  CBB     CS/VBAC    Support Person            History of  pre-eclampsia in prior pregnancy, currently pregnant 06/21/2018 by Nadara MustardHarris, Robert P, MD No   Overview Signed 08/27/2018  8:35 AM by Natale MilchSchuman, Christanna R, MD    [ ]   ASA prescription sent, but patient has not started taking it yet because of the expense.       BMI 37.0-37.9, adult 10/11/2016 by Lindsey BingPickens, Charlie, MD No    Follow up anatomy scan today is complete and normal.  General obstetric precautions were reviewed.  Please refer to After Visit Summary for other counseling recommendations.   Return in about 4 weeks (around  11/12/2018) for ROB.  Marcelyn BruinsJacelyn Zarahi Fuerst, CNM 10/15/2018  8:55 AM

## 2018-10-15 NOTE — Patient Instructions (Signed)

## 2018-11-12 ENCOUNTER — Encounter: Payer: Self-pay | Admitting: Advanced Practice Midwife

## 2018-11-12 ENCOUNTER — Ambulatory Visit (INDEPENDENT_AMBULATORY_CARE_PROVIDER_SITE_OTHER): Payer: 59 | Admitting: Advanced Practice Midwife

## 2018-11-12 VITALS — BP 118/72 | Wt 249.0 lb

## 2018-11-12 DIAGNOSIS — Z3A26 26 weeks gestation of pregnancy: Secondary | ICD-10-CM

## 2018-11-12 DIAGNOSIS — O0990 Supervision of high risk pregnancy, unspecified, unspecified trimester: Secondary | ICD-10-CM

## 2018-11-12 DIAGNOSIS — Z130 Encounter for screening for diseases of the blood and blood-forming organs and certain disorders involving the immune mechanism: Secondary | ICD-10-CM

## 2018-11-12 DIAGNOSIS — Z113 Encounter for screening for infections with a predominantly sexual mode of transmission: Secondary | ICD-10-CM

## 2018-11-12 DIAGNOSIS — O99212 Obesity complicating pregnancy, second trimester: Secondary | ICD-10-CM

## 2018-11-12 DIAGNOSIS — Z131 Encounter for screening for diabetes mellitus: Secondary | ICD-10-CM

## 2018-11-12 LAB — POCT URINALYSIS DIPSTICK OB: Glucose, UA: NEGATIVE

## 2018-11-12 NOTE — Progress Notes (Signed)
ROB

## 2018-11-12 NOTE — Progress Notes (Signed)
Routine Prenatal Care Visit  Subjective  Kimberly Farmer is a 23 y.o. G3P1011 at [redacted]w[redacted]d being seen today for ongoing prenatal care.  She is currently monitored for the following issues for this high-risk pregnancy and has Airway hyperreactivity; F/H of alcoholism; Broken leg; Osteochondritis dissecans of bilateral knees; Bone disease; Patellar instability; Chronic midline low back pain without sciatica; BMI 37.0-37.9, adult; PCOS (polycystic ovarian syndrome); Breakthrough bleeding on birth control pills; Supervision of high risk pregnancy, antepartum; History of pre-eclampsia in prior pregnancy, currently pregnant; Obesity affecting pregnancy; and Choroid plexus cysts, fetal, affecting care of mother, antepartum on their problem list.  ----------------------------------------------------------------------------------- Patient reports no complaints. Discussion of birth control. She is undecided, this is her second pregnancy resulted while on bc pills. Doesn't want IUD. Reviewed options. Discussed healthy diet and decreased carbohydrates, increase hydration.  Contractions: Not present. Vag. Bleeding: None.  Movement: Present. Denies leaking of fluid.  ----------------------------------------------------------------------------------- The following portions of the patient's history were reviewed and updated as appropriate: allergies, current medications, past family history, past medical history, past social history, past surgical history and problem list. Problem list updated.   Objective  Blood pressure 118/72, weight 249 lb (112.9 kg), last menstrual period 05/12/2018, not currently breastfeeding. Pregravid weight 225 lb (102.1 kg) Total Weight Gain 24 lb (10.9 kg) Urinalysis: Urine Protein Small (1+)  Urine Glucose Negative  Fetal Status: Fetal Heart Rate (bpm): 140 Fundal Height: 28 cm Movement: Present     General:  Alert, oriented and cooperative. Patient is in no acute distress.  Skin:  Skin is warm and dry. No rash noted.   Cardiovascular: Normal heart rate noted  Respiratory: Normal respiratory effort, no problems with respiration noted  Abdomen: Soft, gravid, appropriate for gestational age. Pain/Pressure: Present     Pelvic:  Cervical exam deferred        Extremities: Normal range of motion.  Edema: None  Mental Status: Normal mood and affect. Normal behavior. Normal judgment and thought content.   Assessment   23 y.o. G3P1011 at [redacted]w[redacted]d by  02/16/2019, by Last Menstrual Period presenting for routine prenatal visit  Plan   pregnancy 3 Problems (from 04/08/18 to present)    Problem Noted Resolved   Obesity affecting pregnancy 06/25/2018 by Conard Novak, MD No   Supervision of high risk pregnancy, antepartum 06/21/2018 by Nadara Mustard, MD No   Overview Addendum 09/18/2018  1:15 PM by Vena Austria, MD    Clinic [facility] Prenatal Labs  Dating LMP=6wk [place] Blood type: O/Positive/-- (09/06 1006)   Genetic Screen 1 Screen: Negative   AFP:     Antibody:Negative (09/06 1006)  Anatomic [place] Incomplete face/profile, bilateral chorid plexus cyst (Female) Rubella: <0.90 NON Immune Varicella:Immune   GTT Early:  40              Third trimester:  RPR: Non Reactive (09/06 1006)   Rhogam na HBsAg: Negative (09/06 1006)   TDaP vaccine                        Flu Shot:08/01/18 HIV: Non Reactive (09/06 1006)   Baby Food   Breast                             GBS:   Contraception  Pap: 2019 NIL  CBB     CS/VBAC    Support Person  History of pre-eclampsia in prior pregnancy, currently pregnant 06/21/2018 by Nadara Mustard, MD No   Overview Signed 08/27/2018  8:35 AM by Natale Milch, MD    [ ]   ASA prescription sent, but patient has not started taking it yet because of the expense.       BMI 37.0-37.9, adult 10/11/2016 by [place] Bing, MD No       Preterm labor symptoms and general obstetric precautions including but not limited to vaginal  bleeding, contractions, leaking of fluid and fetal movement were reviewed in detail with the patient.   Return in about 2 weeks (around 11/26/2018) for 28 wk labs and rob.  Tresea Mall, CNM 11/12/2018 8:39 AM

## 2018-11-22 ENCOUNTER — Telehealth: Payer: Self-pay

## 2018-11-22 NOTE — Telephone Encounter (Signed)
Pt states she has a headache from smelling car pain fumes at work today.  Someone painted a car where they were not supposed to. Adv e.s. tylenol two po q4-6hrs while awake, allowed 16oz caffeine which includes choc.

## 2018-11-22 NOTE — Telephone Encounter (Signed)
Can she take Goody's powder?  [phone]  UTR mailbox not set up.

## 2018-11-25 ENCOUNTER — Encounter: Payer: 59 | Admitting: Obstetrics and Gynecology

## 2018-11-25 ENCOUNTER — Other Ambulatory Visit: Payer: 59

## 2018-11-28 ENCOUNTER — Telehealth: Payer: Self-pay

## 2018-11-28 NOTE — Telephone Encounter (Signed)
Pt 28wks; dec FM last couple of days; not really any today.  [phone]  Pt has been home c todler today.  Adv to eat/drink something sweet/cold and lay on side and really pay attention to movements.  If less than four to go to L&D for eval.  [place] notified.

## 2018-12-08 ENCOUNTER — Other Ambulatory Visit: Payer: Self-pay

## 2018-12-08 ENCOUNTER — Encounter: Payer: Self-pay | Admitting: *Deleted

## 2018-12-08 ENCOUNTER — Observation Stay
Admission: EM | Admit: 2018-12-08 | Discharge: 2018-12-08 | Disposition: A | Discharge status: Home / Self Care | Payer: Medicaid Other | Attending: Obstetrics and Gynecology | Admitting: Obstetrics and Gynecology

## 2018-12-08 DIAGNOSIS — O26893 Other specified pregnancy related conditions, third trimester: Principal | ICD-10-CM | POA: Insufficient documentation

## 2018-12-08 DIAGNOSIS — R109 Unspecified abdominal pain: Secondary | ICD-10-CM

## 2018-12-08 DIAGNOSIS — O99211 Obesity complicating pregnancy, first trimester: Secondary | ICD-10-CM

## 2018-12-08 DIAGNOSIS — O0990 Supervision of high risk pregnancy, unspecified, unspecified trimester: Secondary | ICD-10-CM

## 2018-12-08 DIAGNOSIS — Z6837 Body mass index (BMI) 37.0-37.9, adult: Secondary | ICD-10-CM

## 2018-12-08 DIAGNOSIS — Z87891 Personal history of nicotine dependence: Secondary | ICD-10-CM | POA: Diagnosis not present

## 2018-12-08 DIAGNOSIS — R1030 Lower abdominal pain, unspecified: Secondary | ICD-10-CM | POA: Diagnosis present

## 2018-12-08 DIAGNOSIS — Z3A30 30 weeks gestation of pregnancy: Secondary | ICD-10-CM | POA: Insufficient documentation

## 2018-12-08 DIAGNOSIS — O09299 Supervision of pregnancy with other poor reproductive or obstetric history, unspecified trimester: Secondary | ICD-10-CM

## 2018-12-08 LAB — URINALYSIS, COMPLETE (UACMP) WITH MICROSCOPIC
Bilirubin Urine: NEGATIVE
Glucose, UA: NEGATIVE mg/dL
Hgb urine dipstick: NEGATIVE
Ketones, ur: NEGATIVE mg/dL
Nitrite: NEGATIVE
Protein, ur: NEGATIVE mg/dL
Specific Gravity, Urine: 1.019 (ref 1.005–1.030)
pH: 6 (ref 5.0–8.0)

## 2018-12-08 NOTE — OB Triage Note (Signed)
Discharge home. Left floor ambulatory with support person. Elaina Hoops

## 2018-12-08 NOTE — Final Progress Note (Signed)
Physician Final Progress Note  Patient ID: Kimberly Farmer MRN: [id number] DOB/AGE: September 05, 1996 23 y.o.  Admit date: 12/08/2018 Admitting provider: Natale Milch, MD Discharge date: 12/08/2018   Admission Diagnoses: Lower abdominal pain  Discharge Diagnoses: Same  History of Present Illness: The patient is a 23 y.o. female G3P1011 at [redacted]w[redacted]d who presents for an increase in lower abdominal pain since 0500 this morning. She reports that she has been having Braxton-Hicks contractions and these are in the same location, but more intense. She rates the pain as 5/10. She denies loss of fluid and vaginal bleeding. No dysuria, CVAT, or suprapubic pain. She has been drinking fluids. She endorses good fetal movement.  Review of Systems: Review of systems negative unless otherwise noted in HPI.   Past Medical History:  Diagnosis Date  . Arthritis    Bilateral knees and back  . Lumbar disc disease   . PCOS (polycystic ovarian syndrome)     Past Surgical History:  Procedure Laterality Date  . KNEE SURGERY Left 2011   Kimberly Farmer has had 5 surgeries on her knee  . TONSILLECTOMY AND ADENOIDECTOMY  2007    No current facility-administered medications on file prior to encounter.    Current Outpatient Medications on File Prior to Encounter  Medication Sig Dispense Refill  . Prenatal MV & Min w/FA-DHA (ONE A DAY PRENATAL PO) Take by mouth.    Marland Kitchen aspirin EC 81 MG tablet Take 1 tablet (81 mg total) by mouth daily. Take after 12 weeks for prevention of preeclampssia later in pregnancy (Patient not taking: Reported on 12/08/2018) 300 tablet 2  . cyclobenzaprine (FLEXERIL) 5 MG tablet Take 1 tablet (5 mg total) by mouth 3 (three) times daily as needed for muscle spasms. (Patient not taking: Reported on 12/08/2018) 30 tablet 1  . folic acid (FOLVITE) 1 MG tablet Take 1 tablet (1 mg total) by mouth daily. (Patient not taking: Reported on 12/08/2018) 30 tablet 10  . topiramate (TOPAMAX) 50 MG tablet START WITH  1 TABLET BY MOUTH DAILY FOR 1 WEEK, THEN INCREASE TO 1 TABLET BY MOUTH TWICE A DAY (Patient not taking: Reported on 12/08/2018) 60 tablet 5    Allergies  Allergen Reactions  . Hydrocodone Other (See Comments)    Other Reaction: SEVERE HA & GI Upset  . Oxycodone Other (See Comments)    Other Reaction: SEVERE HA & GI Upset  . Tape Rash    Social History   Socioeconomic History  . Marital status: Single    Spouse name: Not on file  . Number of children: Not on file  . Years of education: Not on file  . Highest education level: Not on file  Occupational History  . Not on file  Social Needs  . Financial resource strain: Not on file  . Food insecurity:    Worry: Not on file    Inability: Not on file  . Transportation needs:    Medical: Not on file    Non-medical: Not on file  Tobacco Use  . Smoking status: Former Smoker    Types: E-cigarettes  . Smokeless tobacco: Former Neurosurgeon  . Tobacco comment: Vape - Reduced Nicotine  Substance and Sexual Activity  . Alcohol use: No  . Drug use: No  . Sexual activity: Not Currently    Partners: Male    Birth control/protection: Pill  Lifestyle  . Physical activity:    Days per week: Not on file    Minutes per session: Not on file  .  Stress: Not on file  Relationships  . Social connections:    Talks on phone: Not on file    Gets together: Not on file    Attends religious service: Not on file    Active member of club or organization: Not on file    Attends meetings of clubs or organizations: Not on file    Relationship status: Not on file  . Intimate partner violence:    Fear of current or ex partner: Not on file    Emotionally abused: Not on file    Physically abused: Not on file    Forced sexual activity: Not on file  Other Topics Concern  . Not on file  Social History Narrative  . Not on file    Family history: Negative/unremarkable except as detailed in HPI. No family history of birth defects.   Physical Exam: BP  132/78 (BP Location: Right Arm)   Pulse 92   Temp 98.1 F (36.7 C) (Oral)   Resp 16   Ht 5\' 6"  (1.676 m)   Wt 111.1 kg   LMP 05/12/2018   BMI 39.54 kg/m   Gen: NAD CV: Regular rate Pulm: No increased work of breathing Pelvic: Deferred Ext: No signs of DVT  NST:  Baseline: 130 Variability: moderate Accelerations: present Decelerations: present, occasional variable with good return to baseline Tocometry: uterine irritability The patient was monitored for >30 minutes, fetal heart rate tracing was deemed reactive.  Consults: None  Significant Findings/ Diagnostic Studies: labs: UA normal except small leukocytes, urine culture sent  Procedures: NST  Discharge Condition: good  Disposition: Discharge disposition: 01-Home or Self Care       Diet: Regular diet  Discharge Activity: Activity as tolerated   Allergies as of 12/08/2018      Reactions   Hydrocodone Other (See Comments)   Other Reaction: SEVERE HA & GI Upset   Oxycodone Other (See Comments)   Other Reaction: SEVERE HA & GI Upset   Tape Rash      Medication List    TAKE these medications   aspirin EC 81 MG tablet Take 1 tablet (81 mg total) by mouth daily. Take after 12 weeks for prevention of preeclampssia later in pregnancy   cyclobenzaprine 5 MG tablet Commonly known as:  FLEXERIL Take 1 tablet (5 mg total) by mouth 3 (three) times daily as needed for muscle spasms.   folic acid 1 MG tablet Commonly known as:  FOLVITE Take 1 tablet (1 mg total) by mouth daily.   ONE A DAY PRENATAL PO Take by mouth.   topiramate 50 MG tablet Commonly known as:  TOPAMAX START WITH 1 TABLET BY MOUTH DAILY FOR 1 WEEK, THEN INCREASE TO 1 TABLET BY MOUTH TWICE A DAY      No signs of preterm labor. Discussed comfort measures for painful Braxton-Hicks: hydrotherapy, increasing hydration, rest, heating pad on low setting. Will notify patient if urine culture results necessitate antibiotic  therapy,  Signed: Oswaldo Farmer, CNM  12/08/2018

## 2018-12-08 NOTE — OB Triage Note (Signed)
CTx since 0500 this am worsening throughout the morning. Denies vaginal bleeding, LOF. Reports good fetal movement. Elaina Hoops

## 2018-12-08 NOTE — Discharge Summary (Signed)
See Final Progress Note 12/08/2018. 

## 2018-12-11 LAB — URINE CULTURE: Culture: <10000 — AB

## 2018-12-12 ENCOUNTER — Ambulatory Visit (INDEPENDENT_AMBULATORY_CARE_PROVIDER_SITE_OTHER): Payer: 59 | Admitting: Maternal Newborn

## 2018-12-12 ENCOUNTER — Other Ambulatory Visit: Payer: 59

## 2018-12-12 VITALS — BP 120/80

## 2018-12-12 DIAGNOSIS — Z23 Encounter for immunization: Secondary | ICD-10-CM | POA: Diagnosis not present

## 2018-12-12 DIAGNOSIS — Z131 Encounter for screening for diabetes mellitus: Secondary | ICD-10-CM

## 2018-12-12 DIAGNOSIS — O0990 Supervision of high risk pregnancy, unspecified, unspecified trimester: Secondary | ICD-10-CM

## 2018-12-12 DIAGNOSIS — Z130 Encounter for screening for diseases of the blood and blood-forming organs and certain disorders involving the immune mechanism: Secondary | ICD-10-CM

## 2018-12-12 DIAGNOSIS — O09293 Supervision of pregnancy with other poor reproductive or obstetric history, third trimester: Secondary | ICD-10-CM

## 2018-12-12 DIAGNOSIS — Z113 Encounter for screening for infections with a predominantly sexual mode of transmission: Secondary | ICD-10-CM

## 2018-12-12 DIAGNOSIS — Z3A30 30 weeks gestation of pregnancy: Secondary | ICD-10-CM

## 2018-12-12 NOTE — Progress Notes (Signed)
Routine Prenatal Care Visit  Subjective  Kimberly Farmer is a 23 y.o. G3P1011 at [redacted]w[redacted]d being seen today for ongoing prenatal care.  She is currently monitored for the following issues for this high-risk pregnancy and has Airway hyperreactivity; F/H of alcoholism; Broken leg; Osteochondritis dissecans of bilateral knees; Bone disease; Patellar instability; Chronic midline low back pain without sciatica; BMI 37.0-37.9, adult; PCOS (polycystic ovarian syndrome); Breakthrough bleeding on birth control pills; Supervision of high risk pregnancy, antepartum; History of pre-eclampsia in prior pregnancy, currently pregnant; Obesity affecting pregnancy; Choroid plexus cysts, fetal, affecting care of mother, antepartum; and Indication for care in labor and delivery, antepartum on their problem list.  ----------------------------------------------------------------------------------- Patient reports Braxton Hicks contractions.   Contractions: Not present. Vag. Bleeding: None.  Movement: Present. No leaking of fluid.  ----------------------------------------------------------------------------------- The following portions of the patient's history were reviewed and updated as appropriate: allergies, current medications, past family history, past medical history, past social history, past surgical history and problem list. Problem list updated.  Objective  Blood pressure 120/80, last menstrual period 05/12/2018, not currently breastfeeding. Pregravid weight 225 lb (102.1 kg) Total Weight Gain 20 lb (9.072 kg)  Fetal Status: Fetal Heart Rate (bpm): 146 Fundal Height: 32 cm Movement: Present     General:  Alert, oriented and cooperative. Patient is in no acute distress.  Skin: Skin is warm and dry. No rash noted.   Cardiovascular: Normal heart rate noted  Respiratory: Normal respiratory effort, no problems with respiration noted  Abdomen: Soft, gravid, appropriate for gestational age. Pain/Pressure:  Absent     Pelvic:  Cervical exam deferred        Extremities: Normal range of motion.  Edema: None  Mental Status: Normal mood and affect. Normal behavior. Normal judgment and thought content.    Assessment   22 y.o. G3P1011 at [redacted]w[redacted]d, EDD 02/16/2019 by Last Menstrual Period presenting for a routine prenatal visit.  Plan   pregnancy 3 Problems (from 04/08/18 to present)    Problem Noted Resolved   Obesity affecting pregnancy 06/25/2018 by Conard Novak, MD No   Supervision of high risk pregnancy, antepartum 06/21/2018 by Nadara Mustard, MD No   Overview Addendum 09/18/2018  1:15 PM by Vena Austria, MD    [facility]inic [facility] Prenatal Labs  Dating LMP=6wk [place] Blood type: O/Positive/-- (09/06 1006)   Genetic Screen 1 Screen: Negative   AFP:     Antibody:Negative (09/06 1006)  Anatomic [place] Incomplete face/profile, bilateral chorid plexus cyst (Female) Rubella: <0.90 NON Immune Varicella:Immune   GTT Early:  66              Third trimester:  RPR: Non Reactive (09/06 1006)   Rhogam na HBsAg: Negative (09/06 1006)   TDaP vaccine                        Flu Shot:08/01/18 HIV: Non Reactive (09/06 1006)   Baby Food                                GBS:   Contraception  Pap: 2019 NIL  CBB     CS/VBAC    Support Person            History of pre-eclampsia in prior pregnancy, currently pregnant 06/21/2018 by Nadara Mustard, MD No   Overview Signed 08/27/2018  8:35 AM by Natale Milch, MD    [ ]   ASA prescription sent, but patient has not started taking it yet because of the expense.       BMI 37.0-37.9, adult 10/11/2016 by [place] Bing, MD No    GTT and labs today  Please refer to After Visit Summary for other counseling recommendations.   Return in about 2 weeks (around 12/26/2018) for ROB.  Marcelyn Bruins, CNM 12/12/2018

## 2018-12-12 NOTE — Progress Notes (Signed)
28 week labs today. Pt. Stated mucus discharge.

## 2018-12-12 NOTE — Progress Notes (Signed)
Negative, Released to mychart 

## 2018-12-13 LAB — 28 WEEK RH+PANEL
Basophils Absolute: 0 x10E3/uL (ref 0.0–0.2)
Basos: 0 %
EOS (ABSOLUTE): 0.1 x10E3/uL (ref 0.0–0.4)
Eos: 1 %
Gestational Diabetes Screen: 89 mg/dL (ref 65–139)
HIV Screen 4th Generation wRfx: NONREACTIVE
Hematocrit: 34.8 % (ref 34.0–46.6)
Hemoglobin: 11.7 g/dL (ref 11.1–15.9)
Immature Grans (Abs): 0 x10E3/uL (ref 0.0–0.1)
Immature Granulocytes: 0 %
Lymphocytes Absolute: 2.3 x10E3/uL (ref 0.7–3.1)
Lymphs: 20 %
MCH: 26.8 pg (ref 26.6–33.0)
MCHC: 33.6 g/dL (ref 31.5–35.7)
MCV: 80 fL (ref 79–97)
Monocytes Absolute: 0.6 x10E3/uL (ref 0.1–0.9)
Monocytes: 5 %
Neutrophils Absolute: 8.2 x10E3/uL — ABNORMAL HIGH (ref 1.4–7.0)
Neutrophils: 74 %
Platelets: 310 x10E3/uL (ref 150–450)
RBC: 4.37 x10E6/uL (ref 3.77–5.28)
RDW: 13.2 % (ref 11.7–15.4)
RPR Ser Ql: NONREACTIVE
WBC: 11.2 x10E3/uL — ABNORMAL HIGH (ref 3.4–10.8)

## 2018-12-16 ENCOUNTER — Encounter: Payer: Self-pay | Admitting: Maternal Newborn

## 2018-12-16 NOTE — Patient Instructions (Signed)
Third Trimester of Pregnancy The third trimester is from week 28 through week 40 (months 7 through 9). The third trimester is a time when the unborn baby (fetus) is growing rapidly. At the end of the ninth month, the fetus is about 20 inches in length and weighs 6-10 pounds. Body changes during your third trimester Your body will continue to go through many changes during pregnancy. The changes vary from woman to woman. During the third trimester:  Your weight will continue to increase. You can expect to gain 25-35 pounds (11-16 kg) by the end of the pregnancy.  You may begin to get stretch marks on your hips, abdomen, and breasts.  You may urinate more often because the fetus is moving lower into your pelvis and pressing on your bladder.  You may develop or continue to have heartburn. This is caused by increased hormones that slow down muscles in the digestive tract.  You may develop or continue to have constipation because increased hormones slow digestion and cause the muscles that push waste through your intestines to relax.  You may develop hemorrhoids. These are swollen veins (varicose veins) in the rectum that can itch or be painful.  You may develop swollen, bulging veins (varicose veins) in your legs.  You may have increased body aches in the pelvis, back, or thighs. This is due to weight gain and increased hormones that are relaxing your joints.  You may have changes in your hair. These can include thickening of your hair, rapid growth, and changes in texture. Some women also have hair loss during or after pregnancy, or hair that feels dry or thin. Your hair will most likely return to normal after your baby is born.  Your breasts will continue to grow and they will continue to become tender. A yellow fluid (colostrum) may leak from your breasts. This is the first milk you are producing for your baby.  Your belly button may stick out.  You may notice more swelling in your hands,  face, or ankles.  You may have increased tingling or numbness in your hands, arms, and legs. The skin on your belly may also feel numb.  You may feel short of breath because of your expanding uterus.  You may have more problems sleeping. This can be caused by the size of your belly, increased need to urinate, and an increase in your body's metabolism.  You may notice the fetus "dropping," or moving lower in your abdomen (lightening).  You may have increased vaginal discharge.  You may notice your joints feel loose and you may have pain around your pelvic bone. What to expect at prenatal visits You will have prenatal exams every 2 weeks until week 36. Then you will have weekly prenatal exams. During a routine prenatal visit:  You will be weighed to make sure you and the baby are growing normally.  Your blood pressure will be taken.  Your abdomen will be measured to track your baby's growth.  The fetal heartbeat will be listened to.  Any test results from the previous visit will be discussed.  You may have a cervical check near your due date to see if your cervix has softened or thinned (effaced).  You will be tested for Group B streptococcus. This happens between 35 and 37 weeks. Your health care provider may ask you:  What your birth plan is.  How you are feeling.  If you are feeling the baby move.  If you have had any abnormal   symptoms, such as leaking fluid, bleeding, severe headaches, or abdominal cramping.  If you are using any tobacco products, including cigarettes, chewing tobacco, and electronic cigarettes.  If you have any questions. Other tests or screenings that may be performed during your third trimester include:  Blood tests that check for low iron levels (anemia).  Fetal testing to check the health, activity level, and growth of the fetus. Testing is done if you have certain medical conditions or if there are problems during the pregnancy.  Nonstress test  (NST). This test checks the health of your baby to make sure there are no signs of problems, such as the baby not getting enough oxygen. During this test, a belt is placed around your belly. The baby is made to move, and its heart rate is monitored during movement. What is false labor? False labor is a condition in which you feel small, irregular tightenings of the muscles in the womb (contractions) that usually go away with rest, changing position, or drinking water. These are called Braxton Hicks contractions. Contractions may last for hours, days, or even weeks before true labor sets in. If contractions come at regular intervals, become more frequent, increase in intensity, or become painful, you should see your health care provider. What are the signs of labor?  Abdominal cramps.  Regular contractions that start at 10 minutes apart and become stronger and more frequent with time.  Contractions that start on the top of the uterus and spread down to the lower abdomen and back.  Increased pelvic pressure and dull back pain.  A watery or bloody mucus discharge that comes from the vagina.  Leaking of amniotic fluid. This is also known as your "water breaking." It could be a slow trickle or a gush. Let your health care provider know if it has a color or strange odor. If you have any of these signs, call your health care provider right away, even if it is before your due date. Follow these instructions at home: Medicines  Follow your health care provider's instructions regarding medicine use. Specific medicines may be either safe or unsafe to take during pregnancy.  Take a prenatal vitamin that contains at least 600 micrograms (mcg) of folic acid.  If you develop constipation, try taking a stool softener if your health care provider approves. Eating and drinking   Eat a balanced diet that includes fresh fruits and vegetables, whole grains, good sources of protein such as meat, eggs, or tofu,  and low-fat dairy. Your health care provider will help you determine the amount of weight gain that is right for you.  Avoid raw meat and uncooked cheese. These carry germs that can cause birth defects in the baby.  If you have low calcium intake from food, talk to your health care provider about whether you should take a daily calcium supplement.  Eat four or five small meals rather than three large meals a day.  Limit foods that are high in fat and processed sugars, such as fried and sweet foods.  To prevent constipation: ? Drink enough fluid to keep your urine clear or pale yellow. ? Eat foods that are high in fiber, such as fresh fruits and vegetables, whole grains, and beans. Activity  Exercise only as directed by your health care provider. Most women can continue their usual exercise routine during pregnancy. Try to exercise for 30 minutes at least 5 days a week. Stop exercising if you experience uterine contractions.  Avoid heavy lifting.  Do   not exercise in extreme heat or humidity, or at high altitudes.  Wear low-heel, comfortable shoes.  Practice good posture.  You may continue to have sex unless your health care provider tells you otherwise. Relieving pain and discomfort  Take frequent breaks and rest with your legs elevated if you have leg cramps or low back pain.  Take warm sitz baths to soothe any pain or discomfort caused by hemorrhoids. Use hemorrhoid cream if your health care provider approves.  Wear a good support bra to prevent discomfort from breast tenderness.  If you develop varicose veins: ? Wear support pantyhose or compression stockings as told by your healthcare provider. ? Elevate your feet for 15 minutes, 3-4 times a day. Prenatal care  Write down your questions. Take them to your prenatal visits.  Keep all your prenatal visits as told by your health care provider. This is important. Safety  Wear your seat belt at all times when driving.  Make  a list of emergency phone numbers, including numbers for family, friends, the hospital, and police and fire departments. General instructions  Avoid cat litter boxes and soil used by cats. These carry germs that can cause birth defects in the baby. If you have a cat, ask someone to clean the litter box for you.  Do not travel far distances unless it is absolutely necessary and only with the approval of your health care provider.  Do not use hot tubs, steam rooms, or saunas.  Do not drink alcohol.  Do not use any products that contain nicotine or tobacco, such as cigarettes and e-cigarettes. If you need help quitting, ask your health care provider.  Do not use any medicinal herbs or unprescribed drugs. These chemicals affect the formation and growth of the baby.  Do not douche or use tampons or scented sanitary pads.  Do not cross your legs for long periods of time.  To prepare for the arrival of your baby: ? Take prenatal classes to understand, practice, and ask questions about labor and delivery. ? Make a trial run to the hospital. ? Visit the hospital and tour the maternity area. ? Arrange for maternity or paternity leave through employers. ? Arrange for family and friends to take care of pets while you are in the hospital. ? Purchase a rear-facing car seat and make sure you know how to install it in your car. ? Pack your hospital bag. ? Prepare the baby's nursery. Make sure to remove all pillows and stuffed animals from the baby's crib to prevent suffocation.  Visit your dentist if you have not gone during your pregnancy. Use a soft toothbrush to brush your teeth and be gentle when you floss. Contact a health care provider if:  You are unsure if you are in labor or if your water has broken.  You become dizzy.  You have mild pelvic cramps, pelvic pressure, or nagging pain in your abdominal area.  You have lower back pain.  You have persistent nausea, vomiting, or  diarrhea.  You have an unusual or bad smelling vaginal discharge.  You have pain when you urinate. Get help right away if:  Your water breaks before 37 weeks.  You have regular contractions less than 5 minutes apart before 37 weeks.  You have a fever.  You are leaking fluid from your vagina.  You have spotting or bleeding from your vagina.  You have severe abdominal pain or cramping.  You have rapid weight loss or weight gain.  You have   shortness of breath with chest pain.  You notice sudden or extreme swelling of your face, hands, ankles, feet, or legs.  Your baby makes fewer than 10 movements in 2 hours.  You have severe headaches that do not go away when you take medicine.  You have vision changes. Summary  The third trimester is from week 28 through week 40, months 7 through 9. The third trimester is a time when the unborn baby (fetus) is growing rapidly.  During the third trimester, your discomfort may increase as you and your baby continue to gain weight. You may have abdominal, leg, and back pain, sleeping problems, and an increased need to urinate.  During the third trimester your breasts will keep growing and they will continue to become tender. A yellow fluid (colostrum) may leak from your breasts. This is the first milk you are producing for your baby.  False labor is a condition in which you feel small, irregular tightenings of the muscles in the womb (contractions) that eventually go away. These are called Braxton Hicks contractions. Contractions may last for hours, days, or even weeks before true labor sets in.  Signs of labor can include: abdominal cramps; regular contractions that start at 10 minutes apart and become stronger and more frequent with time; watery or bloody mucus discharge that comes from the vagina; increased pelvic pressure and dull back pain; and leaking of amniotic fluid. This information is not intended to replace advice given to you by your  health care provider. Make sure you discuss any questions you have with your health care provider. Document Released: 09/26/2001 Document Revised: 11/07/2016 Document Reviewed: 11/07/2016 Elsevier Interactive Patient Education  2019 [employer]  

## 2018-12-23 ENCOUNTER — Telehealth: Payer: Self-pay

## 2018-12-23 NOTE — Telephone Encounter (Signed)
Pt's grandfather passed away.  The funeral is 3.5hrs away.  Okay to travel?  [phone]  Adv ok to travel; to stop every hour to walk and go to bathroom; drink her water/stay hydrated.

## 2018-12-24 ENCOUNTER — Encounter: Payer: Self-pay | Admitting: Radiology

## 2018-12-25 ENCOUNTER — Other Ambulatory Visit: Payer: Self-pay

## 2018-12-25 ENCOUNTER — Encounter: Payer: Self-pay | Admitting: Maternal Newborn

## 2018-12-25 ENCOUNTER — Ambulatory Visit (INDEPENDENT_AMBULATORY_CARE_PROVIDER_SITE_OTHER): Payer: 59 | Admitting: Maternal Newborn

## 2018-12-25 VITALS — BP 120/68 | Wt 257.0 lb

## 2018-12-25 DIAGNOSIS — O09293 Supervision of pregnancy with other poor reproductive or obstetric history, third trimester: Secondary | ICD-10-CM

## 2018-12-25 DIAGNOSIS — O0990 Supervision of high risk pregnancy, unspecified, unspecified trimester: Secondary | ICD-10-CM

## 2018-12-25 DIAGNOSIS — Z3A32 32 weeks gestation of pregnancy: Secondary | ICD-10-CM

## 2018-12-25 LAB — POCT URINALYSIS DIPSTICK OB
Glucose, UA: NEGATIVE
POC,PROTEIN,UA: NEGATIVE

## 2018-12-25 NOTE — Progress Notes (Signed)
Routine Prenatal Care Visit  Subjective  Kimberly Farmer is a 23 y.o. G3P1011 at [redacted]w[redacted]d being seen today for ongoing prenatal care.  She is currently monitored for the following issues for this high-risk pregnancy and has Airway hyperreactivity; F/H of alcoholism; Broken leg; Osteochondritis dissecans of bilateral knees; Bone disease; Patellar instability; Chronic midline low back pain without sciatica; BMI 37.0-37.9, adult; PCOS (polycystic ovarian syndrome); Breakthrough bleeding on birth control pills; Supervision of high risk pregnancy, antepartum; History of pre-eclampsia in prior pregnancy, currently pregnant; Obesity affecting pregnancy; Choroid plexus cysts, fetal, affecting care of mother, antepartum; and Indication for care in labor and delivery, antepartum on their problem list.  ----------------------------------------------------------------------------------- Patient reports no complaints.   Contractions: Not present. Vag. Bleeding: None.  Movement: Present. No leaking of fluid.  ----------------------------------------------------------------------------------- The following portions of the patient's history were reviewed and updated as appropriate: allergies, current medications, past family history, past medical history, past social history, past surgical history and problem list. Problem list updated.  Objective  Blood pressure 120/68, weight 257 lb (116.6 kg), last menstrual period 05/12/2018, not currently breastfeeding. Pregravid weight 225 lb (102.1 kg) Total Weight Gain 32 lb (14.5 kg)   Urinalysis: Urine dipstick shows negative for glucose, protein.  Fetal Status: Fetal Heart Rate (bpm): 148 Fundal Height: 33 cm Movement: Present     General:  Alert, oriented and cooperative. Patient is in no acute distress.  Skin: Skin is warm and dry. No rash noted.   Cardiovascular: Normal heart rate noted  Respiratory: Normal respiratory effort, no problems with respiration  noted  Abdomen: Soft, gravid, appropriate for gestational age. Pain/Pressure: Present     Pelvic:  Cervical exam deferred        Extremities: Normal range of motion.  Edema: None  Mental Status: Normal mood and affect. Normal behavior. Normal judgment and thought content.    Assessment   23 y.o. G3P1011 at [redacted]w[redacted]d, EDD 02/16/2019 by Last Menstrual Period presenting for a routine prenatal visit.  Plan   pregnancy 3 Problems (from 04/08/18 to present)    Problem Noted Resolved   Obesity affecting pregnancy 06/25/2018 by Conard Novak, MD No   Supervision of high risk pregnancy, antepartum 06/21/2018 by Nadara Mustard, MD No   Overview Addendum 09/18/2018  1:15 PM by Vena Austria, MD    [facility]inic [facility] Prenatal Labs  Dating LMP=6wk [place] Blood type: O/Positive/-- (09/06 1006)   Genetic Screen 1 Screen: Negative   AFP:     Antibody:Negative (09/06 1006)  Anatomic [place] Incomplete face/profile, bilateral chorid plexus cyst (Female) Rubella: <0.90 NON Immune Varicella:Immune   GTT Early:  64              Third trimester:  RPR: Non Reactive (09/06 1006)   Rhogam na HBsAg: Negative (09/06 1006)   TDaP vaccine                        Flu Shot:08/01/18 HIV: Non Reactive (09/06 1006)   Baby Food                                GBS:   Contraception  Pap: 2019 NIL  CBB     CS/VBAC    Support Person            History of pre-eclampsia in prior pregnancy, currently pregnant 06/21/2018 by Nadara Mustard, MD No   Overview  Signed 08/27/2018  8:35 AM by Natale Milch, MD    [ ]   ASA prescription sent, but patient has not started taking it yet because of the expense.       BMI 37.0-37.9, adult 10/11/2016 by [place] Bing, MD No      Please refer to After Visit Summary for other counseling recommendations.   Return in about 2 weeks (around 01/08/2019) for ROB.  Marcelyn Bruins, CNM 12/25/2018

## 2018-12-25 NOTE — Patient Instructions (Signed)
Third Trimester of Pregnancy The third trimester is from week 28 through week 40 (months 7 through 9). The third trimester is a time when the unborn baby (fetus) is growing rapidly. At the end of the ninth month, the fetus is about 20 inches in length and weighs 6-10 pounds. Body changes during your third trimester Your body will continue to go through many changes during pregnancy. The changes vary from woman to woman. During the third trimester:  Your weight will continue to increase. You can expect to gain 25-35 pounds (11-16 kg) by the end of the pregnancy.  You may begin to get stretch marks on your hips, abdomen, and breasts.  You may urinate more often because the fetus is moving lower into your pelvis and pressing on your bladder.  You may develop or continue to have heartburn. This is caused by increased hormones that slow down muscles in the digestive tract.  You may develop or continue to have constipation because increased hormones slow digestion and cause the muscles that push waste through your intestines to relax.  You may develop hemorrhoids. These are swollen veins (varicose veins) in the rectum that can itch or be painful.  You may develop swollen, bulging veins (varicose veins) in your legs.  You may have increased body aches in the pelvis, back, or thighs. This is due to weight gain and increased hormones that are relaxing your joints.  You may have changes in your hair. These can include thickening of your hair, rapid growth, and changes in texture. Some women also have hair loss during or after pregnancy, or hair that feels dry or thin. Your hair will most likely return to normal after your baby is born.  Your breasts will continue to grow and they will continue to become tender. A yellow fluid (colostrum) may leak from your breasts. This is the first milk you are producing for your baby.  Your belly button may stick out.  You may notice more swelling in your hands,  face, or ankles.  You may have increased tingling or numbness in your hands, arms, and legs. The skin on your belly may also feel numb.  You may feel short of breath because of your expanding uterus.  You may have more problems sleeping. This can be caused by the size of your belly, increased need to urinate, and an increase in your body's metabolism.  You may notice the fetus "dropping," or moving lower in your abdomen (lightening).  You may have increased vaginal discharge.  You may notice your joints feel loose and you may have pain around your pelvic bone. What to expect at prenatal visits You will have prenatal exams every 2 weeks until week 36. Then you will have weekly prenatal exams. During a routine prenatal visit:  You will be weighed to make sure you and the baby are growing normally.  Your blood pressure will be taken.  Your abdomen will be measured to track your baby's growth.  The fetal heartbeat will be listened to.  Any test results from the previous visit will be discussed.  You may have a cervical check near your due date to see if your cervix has softened or thinned (effaced).  You will be tested for Group B streptococcus. This happens between 35 and 37 weeks. Your health care provider may ask you:  What your birth plan is.  How you are feeling.  If you are feeling the baby move.  If you have had any abnormal   symptoms, such as leaking fluid, bleeding, severe headaches, or abdominal cramping.  If you are using any tobacco products, including cigarettes, chewing tobacco, and electronic cigarettes.  If you have any questions. Other tests or screenings that may be performed during your third trimester include:  Blood tests that check for low iron levels (anemia).  Fetal testing to check the health, activity level, and growth of the fetus. Testing is done if you have certain medical conditions or if there are problems during the pregnancy.  Nonstress test  (NST). This test checks the health of your baby to make sure there are no signs of problems, such as the baby not getting enough oxygen. During this test, a belt is placed around your belly. The baby is made to move, and its heart rate is monitored during movement. What is false labor? False labor is a condition in which you feel small, irregular tightenings of the muscles in the womb (contractions) that usually go away with rest, changing position, or drinking water. These are called Braxton Hicks contractions. Contractions may last for hours, days, or even weeks before true labor sets in. If contractions come at regular intervals, become more frequent, increase in intensity, or become painful, you should see your health care provider. What are the signs of labor?  Abdominal cramps.  Regular contractions that start at 10 minutes apart and become stronger and more frequent with time.  Contractions that start on the top of the uterus and spread down to the lower abdomen and back.  Increased pelvic pressure and dull back pain.  A watery or bloody mucus discharge that comes from the vagina.  Leaking of amniotic fluid. This is also known as your "water breaking." It could be a slow trickle or a gush. Let your health care provider know if it has a color or strange odor. If you have any of these signs, call your health care provider right away, even if it is before your due date. Follow these instructions at home: Medicines  Follow your health care provider's instructions regarding medicine use. Specific medicines may be either safe or unsafe to take during pregnancy.  Take a prenatal vitamin that contains at least 600 micrograms (mcg) of folic acid.  If you develop constipation, try taking a stool softener if your health care provider approves. Eating and drinking   Eat a balanced diet that includes fresh fruits and vegetables, whole grains, good sources of protein such as meat, eggs, or tofu,  and low-fat dairy. Your health care provider will help you determine the amount of weight gain that is right for you.  Avoid raw meat and uncooked cheese. These carry germs that can cause birth defects in the baby.  If you have low calcium intake from food, talk to your health care provider about whether you should take a daily calcium supplement.  Eat four or five small meals rather than three large meals a day.  Limit foods that are high in fat and processed sugars, such as fried and sweet foods.  To prevent constipation: ? Drink enough fluid to keep your urine clear or pale yellow. ? Eat foods that are high in fiber, such as fresh fruits and vegetables, whole grains, and beans. Activity  Exercise only as directed by your health care provider. Most women can continue their usual exercise routine during pregnancy. Try to exercise for 30 minutes at least 5 days a week. Stop exercising if you experience uterine contractions.  Avoid heavy lifting.  Do   not exercise in extreme heat or humidity, or at high altitudes.  Wear low-heel, comfortable shoes.  Practice good posture.  You may continue to have sex unless your health care provider tells you otherwise. Relieving pain and discomfort  Take frequent breaks and rest with your legs elevated if you have leg cramps or low back pain.  Take warm sitz baths to soothe any pain or discomfort caused by hemorrhoids. Use hemorrhoid cream if your health care provider approves.  Wear a good support bra to prevent discomfort from breast tenderness.  If you develop varicose veins: ? Wear support pantyhose or compression stockings as told by your healthcare provider. ? Elevate your feet for 15 minutes, 3-4 times a day. Prenatal care  Write down your questions. Take them to your prenatal visits.  Keep all your prenatal visits as told by your health care provider. This is important. Safety  Wear your seat belt at all times when driving.  Make  a list of emergency phone numbers, including numbers for family, friends, the hospital, and police and fire departments. General instructions  Avoid cat litter boxes and soil used by cats. These carry germs that can cause birth defects in the baby. If you have a cat, ask someone to clean the litter box for you.  Do not travel far distances unless it is absolutely necessary and only with the approval of your health care provider.  Do not use hot tubs, steam rooms, or saunas.  Do not drink alcohol.  Do not use any products that contain nicotine or tobacco, such as cigarettes and e-cigarettes. If you need help quitting, ask your health care provider.  Do not use any medicinal herbs or unprescribed drugs. These chemicals affect the formation and growth of the baby.  Do not douche or use tampons or scented sanitary pads.  Do not cross your legs for long periods of time.  To prepare for the arrival of your baby: ? Take prenatal classes to understand, practice, and ask questions about labor and delivery. ? Make a trial run to the hospital. ? Visit the hospital and tour the maternity area. ? Arrange for maternity or paternity leave through employers. ? Arrange for family and friends to take care of pets while you are in the hospital. ? Purchase a rear-facing car seat and make sure you know how to install it in your car. ? Pack your hospital bag. ? Prepare the baby's nursery. Make sure to remove all pillows and stuffed animals from the baby's crib to prevent suffocation.  Visit your dentist if you have not gone during your pregnancy. Use a soft toothbrush to brush your teeth and be gentle when you floss. Contact a health care provider if:  You are unsure if you are in labor or if your water has broken.  You become dizzy.  You have mild pelvic cramps, pelvic pressure, or nagging pain in your abdominal area.  You have lower back pain.  You have persistent nausea, vomiting, or  diarrhea.  You have an unusual or bad smelling vaginal discharge.  You have pain when you urinate. Get help right away if:  Your water breaks before 37 weeks.  You have regular contractions less than 5 minutes apart before 37 weeks.  You have a fever.  You are leaking fluid from your vagina.  You have spotting or bleeding from your vagina.  You have severe abdominal pain or cramping.  You have rapid weight loss or weight gain.  You have   shortness of breath with chest pain.  You notice sudden or extreme swelling of your face, hands, ankles, feet, or legs.  Your baby makes fewer than 10 movements in 2 hours.  You have severe headaches that do not go away when you take medicine.  You have vision changes. Summary  The third trimester is from week 28 through week 40, months 7 through 9. The third trimester is a time when the unborn baby (fetus) is growing rapidly.  During the third trimester, your discomfort may increase as you and your baby continue to gain weight. You may have abdominal, leg, and back pain, sleeping problems, and an increased need to urinate.  During the third trimester your breasts will keep growing and they will continue to become tender. A yellow fluid (colostrum) may leak from your breasts. This is the first milk you are producing for your baby.  False labor is a condition in which you feel small, irregular tightenings of the muscles in the womb (contractions) that eventually go away. These are called Braxton Hicks contractions. Contractions may last for hours, days, or even weeks before true labor sets in.  Signs of labor can include: abdominal cramps; regular contractions that start at 10 minutes apart and become stronger and more frequent with time; watery or bloody mucus discharge that comes from the vagina; increased pelvic pressure and dull back pain; and leaking of amniotic fluid. This information is not intended to replace advice given to you by your  health care provider. Make sure you discuss any questions you have with your health care provider. Document Released: 09/26/2001 Document Revised: 11/07/2016 Document Reviewed: 11/07/2016 Elsevier Interactive Patient Education  2019 [employer]  

## 2018-12-25 NOTE — Progress Notes (Signed)
Pelvic pressure. + Braxton Hicks ctx.

## 2018-12-26 ENCOUNTER — Encounter: Payer: 59 | Admitting: Advanced Practice Midwife

## 2019-01-08 ENCOUNTER — Encounter: Payer: Self-pay | Admitting: Obstetrics and Gynecology

## 2019-01-08 ENCOUNTER — Other Ambulatory Visit: Payer: Self-pay

## 2019-01-08 ENCOUNTER — Ambulatory Visit (INDEPENDENT_AMBULATORY_CARE_PROVIDER_SITE_OTHER): Payer: 59 | Admitting: Obstetrics and Gynecology

## 2019-01-08 DIAGNOSIS — Z3A34 34 weeks gestation of pregnancy: Secondary | ICD-10-CM

## 2019-01-08 DIAGNOSIS — O09299 Supervision of pregnancy with other poor reproductive or obstetric history, unspecified trimester: Secondary | ICD-10-CM

## 2019-01-08 DIAGNOSIS — O0990 Supervision of high risk pregnancy, unspecified, unspecified trimester: Secondary | ICD-10-CM

## 2019-01-08 DIAGNOSIS — O09293 Supervision of pregnancy with other poor reproductive or obstetric history, third trimester: Secondary | ICD-10-CM

## 2019-01-08 NOTE — Patient Instructions (Signed)
COVID-19 and Your Pregnancy FAQ  How can I prevent infection with COVID-19 during my pregnancy? Social distancing is key. Please limit any interactions in public. Try and work from home if possible. Frequently wash your hands after touching possibly contaminated surfaces. Avoid touching your face.  Minimize trips to the store. Consider online ordering when possible.   Should I wear a mask? Masks should only be worn by those experiencing symptoms of COVID-19 or those with confirmed COVID-19 when they are in public or around other individuals.  What are the symptoms of COVID-19? Fever (greater than 100.4 F), dry cough, shortness of breath.  Am I more at risk for COVID-19 since I am pregnant? There is not currently data showing that pregnant women are more adversely impacted by COVID-19 than the general population. However, we know that pregnant women tend to have worse respiratory complications from similar diseases such as the flu and SARS and for this reason should be considered an at-risk popoulation.  What do I do if I am experiencing the symptoms of COVID-19? Testing is being limited because of test availability. If you are experiencing symptoms you should quarantine yourself, and the members of your family, for at least 2 weeks at home.   Please visit this website for more information: [email or web address]  When should I go to the Emergency Room? Please go to the emergency room if you are experiencing ANY of theses symptoms*:  1.    Difficulty breathing or shortness of breath 2.    Persistent pain or pressure in the chest 3.    Confusion or difficulty being aroused (or awakened) 4.    Bluish lips or face  *This list is not all inclusive. Please consult our office for any other symptoms that are severe or concerning.  What do I do if I am having difficulty breathing? You should go to the Emergency Room for evaluation.  At this time they have a tent set up for evaluating patients with COVID-19 symptoms.   How will my hospital birth experience be different? The hospital is currently limiting visitors. This means that while you are in labor you can only have one person at the hospital with you. Additional family members will not be allowed to wait in the building or outside your room. Your one support person can be the father of the baby, a relative, a doula, or a friend. Once one support person is designated that person will wear a band. This band can not be shared with multiple people.  How long will I stay in the hospital for after giving birth? It is also recommended that discharge home be expedited during the COVID-19 outbreak. This means staying for 1 day after a vaginal delivery and 2 days after a cesarean section.  What if I have COVID-19 and I am in labor? We ask that you wear a mask while on labor and delivery. We will try and accommodate you being placed in a room that is capable of filtering the air. Please call ahead if you are in labor and on your way to the hospital. The phone number for labor and delivery is [phone].  If I have COVID-19 when my baby is born how can I prevent my baby from contracting COVID-19? This is an issue that will have to be discussed on a case-by-case basis. Current recommendations suggest providing separate isolation rooms for both the mother and new infant as well as limiting visitors. However, there are practical challenges to this  recommendation. The situation will assuredly change and decisions will be influenced by the desires of the mother and availability of space.  Some suggestions are the use of a curtain or physical barrier between mom and infant, hand hygiene, mom wearing a mask, or 6 feet of spacing between a mom and infant.   Can I breastfeed during the COVID-19 pandemic?   Yes, breastfeeding is encouraged.  Can I breastfeed if I have COVID-19? Yes.  Covid-19 has not been found in breast milk. This means you can not give COVID-19 to your child through breast milk. Breast feeding will also help pass antibodies to fight infection to your baby.   What precautions should I take when breastfeeding if I have COVID-19? If a mother and newborn do room-in and the mother wishes to feed at the breast, she should put on a facemask and practice hand hygiene before each feeding.  What precautions should I take when pumping if I have COVID-19? Prior to expressing breast milk, mothers should practice hand hygiene. After each pumping session, all parts that come into contact with breast milk should be thoroughly washed and the entire pump should be appropriately disinfected per the manufacturer's instructions. This expressed breast milk should be fed to the newborn by a healthy caregiver.  What if I am pregnant and work in healthcare? Based on limited data regarding COVID-19 and pregnancy, ACOG currently does not propose creating additional restrictions on pregnant health care personnel because of COVID-19 alone. Pregnant women do not appear to be at higher risk of severe disease related to COVID-19. Pregnant health care personnel should follow CDC risk assessment and infection control guidelines for health care personnel exposed to patients with suspected or confirmed COVID-19. Adherence to recommended infection prevention and control practices is an important part of protecting all health care personnel in health care settings.    Information on COVID-19 in pregnancy is very limited; however, facilities may want to consider limiting exposure of pregnant health care personnel to patients with confirmed or suspected COVID-19 infection, especially during higher-risk procedures (eg, aerosol-generating procedures), if feasible, based on staffing availability.     Hello Kimberly Farmer,   Given the current COVID-19 pandemic, our practice is making changes in how we are  providing care to our patients. We are limiting in-person visits for the safety of all of our patients.   As a practice, we have met to discuss the best way to minimize visits, but still provide excellent care to our expecting mothers.  We have decided on the following visit structure for low-risk pregnancies.  Initial Pregnancy visit will be conducted as a telephone or web visit.  Between 10-14 weeks  there will be one in-person visit for an ultrasound, lab work, and genetic screening. 20 weeks in-person visit with an anatomy ultrasound  28 weeks in-person office visit for a 1-hour glucose test and a TDAP vaccination 32 weeks in-person office visit 34 weeks telephone visit 36 weeks in-person office visit 38 weeks in-person office visit 40 weeks in-person office visit  Understandably, some patients will require more visits than what is outlined above. Additional visits will be determined on a case-by-case basis.   We will, as always, be available for emergencies or to address concerns that might arise between in-person visits. We ask that you allow [place] the opportunity to address any concerns over the phone or through a virtual visit first. We will be available to return your phone calls throughout the day.   If you are able  to purchase a scale, a blood pressure machine, and a home fetal doppler visits could be limited further. This will help decrease your exposure risks, but these purchases are not a necessity.   Thank you for trusting [place] with your prenatal care. Our practice values you and looks forward to providing you with excellent care.   Sincerely,   [facility], [place]

## 2019-01-08 NOTE — Progress Notes (Signed)
  01/08/2019   Virtual Visit via Telephone Note  I connected with Kimberly Farmer on 01/08/19 at  8:10 AM EDT by telephone and verified that I am speaking with the correct person using two identifiers.   I discussed the limitations, risks, security and privacy concerns of performing an evaluation and management service by telephone and the availability of in person appointments. I also discussed with the patient that there may be a patient responsible charge related to this service. The patient expressed understanding and agreed to proceed.  The patient was located at her parent's home I spoke with the patient from my  office The names of people involved in this encounter were: Kimberly Farmer and Dr. Adelene Idler .   No concerns with pregnancy concerns No contractions, no vaginal bleeding, no leakage of fluid. Occasional braxton hicks 30-40 minutes a day.  Feeling good fetal movements. Denies headache, denies vision changes. No swelling.  Patient does have a scale. She does not have a blood pressure cuff or a fetal monitor. She knows how to do kick counts and has an app on her phone.  She can go to [facility] if she has a concern about her blood pressure and have it checked there. She can also come to the office. She know that values greater than 140/90 are abnormal.  I helped her log into her mychart account.  She downloaded the app on her phone. We discussed birth control options. She would like something that she can easily discontinue on her own at home. Perhaps Nuvaring? Referred her to [email or web address] for further information. Discussed cha  I provided 15 minutes of non-face-to-face time during this encounter.  Adelene Idler MD [facility], [facility] 01/08/2019 8:17 AM

## 2019-01-22 ENCOUNTER — Other Ambulatory Visit (HOSPITAL_COMMUNITY)
Admission: RE | Admit: 2019-01-22 | Discharge: 2019-01-22 | Disposition: A | Discharge status: Home / Self Care | Payer: Medicaid Other | Source: Ambulatory Visit | Attending: Maternal Newborn | Admitting: Maternal Newborn

## 2019-01-22 ENCOUNTER — Encounter: Payer: Self-pay | Admitting: Maternal Newborn

## 2019-01-22 ENCOUNTER — Ambulatory Visit (INDEPENDENT_AMBULATORY_CARE_PROVIDER_SITE_OTHER): Payer: 59 | Admitting: Maternal Newborn

## 2019-01-22 VITALS — BP 120/80 | Wt 264.0 lb

## 2019-01-22 DIAGNOSIS — O0990 Supervision of high risk pregnancy, unspecified, unspecified trimester: Secondary | ICD-10-CM | POA: Diagnosis not present

## 2019-01-22 DIAGNOSIS — O09293 Supervision of pregnancy with other poor reproductive or obstetric history, third trimester: Secondary | ICD-10-CM

## 2019-01-22 DIAGNOSIS — Z3A36 36 weeks gestation of pregnancy: Secondary | ICD-10-CM

## 2019-01-22 LAB — POCT URINALYSIS DIPSTICK OB
Glucose, UA: NEGATIVE
POC,PROTEIN,UA: NEGATIVE

## 2019-01-22 NOTE — Progress Notes (Signed)
ROB/GBS- no concerns 

## 2019-01-22 NOTE — Patient Instructions (Signed)
Third Trimester of Pregnancy The third trimester is from week 28 through week 40 (months 7 through 9). The third trimester is a time when the unborn baby (fetus) is growing rapidly. At the end of the ninth month, the fetus is about 20 inches in length and weighs 6-10 pounds. Body changes during your third trimester Your body will continue to go through many changes during pregnancy. The changes vary from woman to woman. During the third trimester:  Your weight will continue to increase. You can expect to gain 25-35 pounds (11-16 kg) by the end of the pregnancy.  You may begin to get stretch marks on your hips, abdomen, and breasts.  You may urinate more often because the fetus is moving lower into your pelvis and pressing on your bladder.  You may develop or continue to have heartburn. This is caused by increased hormones that slow down muscles in the digestive tract.  You may develop or continue to have constipation because increased hormones slow digestion and cause the muscles that push waste through your intestines to relax.  You may develop hemorrhoids. These are swollen veins (varicose veins) in the rectum that can itch or be painful.  You may develop swollen, bulging veins (varicose veins) in your legs.  You may have increased body aches in the pelvis, back, or thighs. This is due to weight gain and increased hormones that are relaxing your joints.  You may have changes in your hair. These can include thickening of your hair, rapid growth, and changes in texture. Some women also have hair loss during or after pregnancy, or hair that feels dry or thin. Your hair will most likely return to normal after your baby is born.  Your breasts will continue to grow and they will continue to become tender. A yellow fluid (colostrum) may leak from your breasts. This is the first milk you are producing for your baby.  Your belly button may stick out.  You may notice more swelling in your hands,  face, or ankles.  You may have increased tingling or numbness in your hands, arms, and legs. The skin on your belly may also feel numb.  You may feel short of breath because of your expanding uterus.  You may have more problems sleeping. This can be caused by the size of your belly, increased need to urinate, and an increase in your body's metabolism.  You may notice the fetus "dropping," or moving lower in your abdomen (lightening).  You may have increased vaginal discharge.  You may notice your joints feel loose and you may have pain around your pelvic bone. What to expect at prenatal visits You will have prenatal exams every 2 weeks until week 36. Then you will have weekly prenatal exams. During a routine prenatal visit:  You will be weighed to make sure you and the baby are growing normally.  Your blood pressure will be taken.  Your abdomen will be measured to track your baby's growth.  The fetal heartbeat will be listened to.  Any test results from the previous visit will be discussed.  You may have a cervical check near your due date to see if your cervix has softened or thinned (effaced).  You will be tested for Group B streptococcus. This happens between 35 and 37 weeks. Your health care provider may ask you:  What your birth plan is.  How you are feeling.  If you are feeling the baby move.  If you have had any abnormal   symptoms, such as leaking fluid, bleeding, severe headaches, or abdominal cramping.  If you are using any tobacco products, including cigarettes, chewing tobacco, and electronic cigarettes.  If you have any questions. Other tests or screenings that may be performed during your third trimester include:  Blood tests that check for low iron levels (anemia).  Fetal testing to check the health, activity level, and growth of the fetus. Testing is done if you have certain medical conditions or if there are problems during the pregnancy.  Nonstress test  (NST). This test checks the health of your baby to make sure there are no signs of problems, such as the baby not getting enough oxygen. During this test, a belt is placed around your belly. The baby is made to move, and its heart rate is monitored during movement. What is false labor? False labor is a condition in which you feel small, irregular tightenings of the muscles in the womb (contractions) that usually go away with rest, changing position, or drinking water. These are called Braxton Hicks contractions. Contractions may last for hours, days, or even weeks before true labor sets in. If contractions come at regular intervals, become more frequent, increase in intensity, or become painful, you should see your health care provider. What are the signs of labor?  Abdominal cramps.  Regular contractions that start at 10 minutes apart and become stronger and more frequent with time.  Contractions that start on the top of the uterus and spread down to the lower abdomen and back.  Increased pelvic pressure and dull back pain.  A watery or bloody mucus discharge that comes from the vagina.  Leaking of amniotic fluid. This is also known as your "water breaking." It could be a slow trickle or a gush. Let your health care provider know if it has a color or strange odor. If you have any of these signs, call your health care provider right away, even if it is before your due date. Follow these instructions at home: Medicines  Follow your health care provider's instructions regarding medicine use. Specific medicines may be either safe or unsafe to take during pregnancy.  Take a prenatal vitamin that contains at least 600 micrograms (mcg) of folic acid.  If you develop constipation, try taking a stool softener if your health care provider approves. Eating and drinking   Eat a balanced diet that includes fresh fruits and vegetables, whole grains, good sources of protein such as meat, eggs, or tofu,  and low-fat dairy. Your health care provider will help you determine the amount of weight gain that is right for you.  Avoid raw meat and uncooked cheese. These carry germs that can cause birth defects in the baby.  If you have low calcium intake from food, talk to your health care provider about whether you should take a daily calcium supplement.  Eat four or five small meals rather than three large meals a day.  Limit foods that are high in fat and processed sugars, such as fried and sweet foods.  To prevent constipation: ? Drink enough fluid to keep your urine clear or pale yellow. ? Eat foods that are high in fiber, such as fresh fruits and vegetables, whole grains, and beans. Activity  Exercise only as directed by your health care provider. Most women can continue their usual exercise routine during pregnancy. Try to exercise for 30 minutes at least 5 days a week. Stop exercising if you experience uterine contractions.  Avoid heavy lifting.  Do   not exercise in extreme heat or humidity, or at high altitudes.  Wear low-heel, comfortable shoes.  Practice good posture.  You may continue to have sex unless your health care provider tells you otherwise. Relieving pain and discomfort  Take frequent breaks and rest with your legs elevated if you have leg cramps or low back pain.  Take warm sitz baths to soothe any pain or discomfort caused by hemorrhoids. Use hemorrhoid cream if your health care provider approves.  Wear a good support bra to prevent discomfort from breast tenderness.  If you develop varicose veins: ? Wear support pantyhose or compression stockings as told by your healthcare provider. ? Elevate your feet for 15 minutes, 3-4 times a day. Prenatal care  Write down your questions. Take them to your prenatal visits.  Keep all your prenatal visits as told by your health care provider. This is important. Safety  Wear your seat belt at all times when driving.  Make  a list of emergency phone numbers, including numbers for family, friends, the hospital, and police and fire departments. General instructions  Avoid cat litter boxes and soil used by cats. These carry germs that can cause birth defects in the baby. If you have a cat, ask someone to clean the litter box for you.  Do not travel far distances unless it is absolutely necessary and only with the approval of your health care provider.  Do not use hot tubs, steam rooms, or saunas.  Do not drink alcohol.  Do not use any products that contain nicotine or tobacco, such as cigarettes and e-cigarettes. If you need help quitting, ask your health care provider.  Do not use any medicinal herbs or unprescribed drugs. These chemicals affect the formation and growth of the baby.  Do not douche or use tampons or scented sanitary pads.  Do not cross your legs for long periods of time.  To prepare for the arrival of your baby: ? Take prenatal classes to understand, practice, and ask questions about labor and delivery. ? Make a trial run to the hospital. ? Visit the hospital and tour the maternity area. ? Arrange for maternity or paternity leave through employers. ? Arrange for family and friends to take care of pets while you are in the hospital. ? Purchase a rear-facing car seat and make sure you know how to install it in your car. ? Pack your hospital bag. ? Prepare the baby's nursery. Make sure to remove all pillows and stuffed animals from the baby's crib to prevent suffocation.  Visit your dentist if you have not gone during your pregnancy. Use a soft toothbrush to brush your teeth and be gentle when you floss. Contact a health care provider if:  You are unsure if you are in labor or if your water has broken.  You become dizzy.  You have mild pelvic cramps, pelvic pressure, or nagging pain in your abdominal area.  You have lower back pain.  You have persistent nausea, vomiting, or  diarrhea.  You have an unusual or bad smelling vaginal discharge.  You have pain when you urinate. Get help right away if:  Your water breaks before 37 weeks.  You have regular contractions less than 5 minutes apart before 37 weeks.  You have a fever.  You are leaking fluid from your vagina.  You have spotting or bleeding from your vagina.  You have severe abdominal pain or cramping.  You have rapid weight loss or weight gain.  You have   shortness of breath with chest pain.  You notice sudden or extreme swelling of your face, hands, ankles, feet, or legs.  Your baby makes fewer than 10 movements in 2 hours.  You have severe headaches that do not go away when you take medicine.  You have vision changes. Summary  The third trimester is from week 28 through week 40, months 7 through 9. The third trimester is a time when the unborn baby (fetus) is growing rapidly.  During the third trimester, your discomfort may increase as you and your baby continue to gain weight. You may have abdominal, leg, and back pain, sleeping problems, and an increased need to urinate.  During the third trimester your breasts will keep growing and they will continue to become tender. A yellow fluid (colostrum) may leak from your breasts. This is the first milk you are producing for your baby.  False labor is a condition in which you feel small, irregular tightenings of the muscles in the womb (contractions) that eventually go away. These are called Braxton Hicks contractions. Contractions may last for hours, days, or even weeks before true labor sets in.  Signs of labor can include: abdominal cramps; regular contractions that start at 10 minutes apart and become stronger and more frequent with time; watery or bloody mucus discharge that comes from the vagina; increased pelvic pressure and dull back pain; and leaking of amniotic fluid. This information is not intended to replace advice given to you by your  health care provider. Make sure you discuss any questions you have with your health care provider. Document Released: 09/26/2001 Document Revised: 11/07/2016 Document Reviewed: 11/07/2016 Elsevier Interactive Patient Education  2019 [employer]  

## 2019-01-22 NOTE — Progress Notes (Signed)
Routine Prenatal Care Visit  Subjective  Kimberly Farmer is a 23 y.o. G3P1011 at [redacted]w[redacted]d being seen today for ongoing prenatal care.  She is currently monitored for the following issues for this high-risk pregnancy and has Airway hyperreactivity; F/H of alcoholism; Broken leg; Osteochondritis dissecans of bilateral knees; Bone disease; Patellar instability; Chronic midline low back pain without sciatica; BMI 37.0-37.9, adult; PCOS (polycystic ovarian syndrome); Breakthrough bleeding on birth control pills; Supervision of high risk pregnancy, antepartum; History of pre-eclampsia in prior pregnancy, currently pregnant; Obesity affecting pregnancy; and Choroid plexus cysts, fetal, affecting care of mother, antepartum on their problem list.  ----------------------------------------------------------------------------------- Patient reports backache, Braxton-Hicks contractions.   Contractions: Not present. Vag. Bleeding: None.  Movement: Present. No leaking of fluid.  ----------------------------------------------------------------------------------- The following portions of the patient's history were reviewed and updated as appropriate: allergies, current medications, past family history, past medical history, past social history, past surgical history and problem list. Problem list updated.  Objective  Blood pressure 120/80, weight 264 lb (119.7 kg), last menstrual period 05/12/2018, not currently breastfeeding. Pregravid weight 225 lb (102.1 kg) Total Weight Gain 39 lb (17.7 kg)   Urinalysis: Urine dipstick shows negative for glucose, protein.  Fetal Status: Fetal Heart Rate (bpm): 142 Fundal Height: 37 cm Movement: Present     General:  Alert, oriented and cooperative. Patient is in no acute distress.  Skin: Skin is warm and dry. No rash noted.   Cardiovascular: Normal heart rate noted  Respiratory: Normal respiratory effort, no problems with respiration noted  Abdomen: Soft, gravid,  appropriate for gestational age. Pain/Pressure: Present     Pelvic:  Cervical exam deferred        Extremities: Normal range of motion.  Edema: None  Mental Status: Normal mood and affect. Normal behavior. Normal judgment and thought content.    Assessment   23 y.o. G3P1011 at [redacted]w[redacted]d, EDD 02/16/2019 by Last Menstrual Period presenting for a routine prenatal visit.  Plan   pregnancy 3 Problems (from 04/08/18 to present)    Problem Noted Resolved   Obesity affecting pregnancy 06/25/2018 by Conard Novak, MD No   Supervision of high risk pregnancy, antepartum 06/21/2018 by Nadara Mustard, MD No   Overview Addendum 01/08/2019  8:51 AM by Natale Milch, MD    Clinic [facility] Prenatal Labs  Dating LMP=6wk [place] Blood type: O/Positive/-- (09/06 1006)   Genetic Screen 1 Screen: Negative   AFP:     Antibody:Negative (09/06 1006)  Anatomic [place] Complete Rubella: <0.90 NON Immune Varicella:Immune   GTT Early:  98              Third trimester:  RPR: Non Reactive (09/06 1006)   Rhogam na HBsAg: Negative (09/06 1006)   TDaP vaccine   12/12/2018                     Flu Shot:08/01/18 HIV: Non Reactive (09/06 1006)   Baby Food  Breastfeeding                              GBS:   Contraception  Undecided- considering pill vs. nuvaring? Pap: 2019 NIL  CBB     CS/VBAC    Support Person            History of pre-eclampsia in prior pregnancy, currently pregnant 06/21/2018 by Nadara Mustard, MD No   Overview Signed 08/27/2018  8:35 AM by Jerene Pitch,  Christanna R, MD    [ ]   ASA prescription sent, but patient has not started taking it yet because of the expense.       BMI 37.0-37.9, adult 10/11/2016 by Bradley BingPickens, Charlie, MD No    GBS and Aptima done today.   Preterm labor symptoms and general obstetric precautions including but not limited to vaginal bleeding, contractions, leaking of fluid and fetal movement were reviewed.  Please refer to After Visit Summary for other counseling  recommendations.   Return in about 2 weeks (around 02/05/2019) for ROB.  Marcelyn BruinsJacelyn Monnie Gudgel, CNM 01/22/2019

## 2019-01-24 LAB — STREP GP B NAA: Strep Gp B NAA: POSITIVE — AB

## 2019-01-26 ENCOUNTER — Other Ambulatory Visit: Payer: Self-pay

## 2019-01-26 ENCOUNTER — Observation Stay
Admission: EM | Admit: 2019-01-26 | Discharge: 2019-01-26 | Disposition: A | Discharge status: Home / Self Care | Payer: Medicaid Other | Attending: Obstetrics and Gynecology | Admitting: Obstetrics and Gynecology

## 2019-01-26 DIAGNOSIS — Z888 Allergy status to other drugs, medicaments and biological substances status: Secondary | ICD-10-CM | POA: Diagnosis not present

## 2019-01-26 DIAGNOSIS — O368190 Decreased fetal movements, unspecified trimester, not applicable or unspecified: Secondary | ICD-10-CM | POA: Diagnosis present

## 2019-01-26 DIAGNOSIS — Z885 Allergy status to narcotic agent status: Secondary | ICD-10-CM | POA: Insufficient documentation

## 2019-01-26 DIAGNOSIS — Z3A37 37 weeks gestation of pregnancy: Secondary | ICD-10-CM | POA: Diagnosis not present

## 2019-01-26 DIAGNOSIS — Z7982 Long term (current) use of aspirin: Secondary | ICD-10-CM | POA: Insufficient documentation

## 2019-01-26 DIAGNOSIS — Z79899 Other long term (current) drug therapy: Secondary | ICD-10-CM | POA: Diagnosis not present

## 2019-01-26 DIAGNOSIS — O368130 Decreased fetal movements, third trimester, not applicable or unspecified: Secondary | ICD-10-CM | POA: Diagnosis not present

## 2019-01-26 DIAGNOSIS — Z87891 Personal history of nicotine dependence: Secondary | ICD-10-CM | POA: Insufficient documentation

## 2019-01-26 DIAGNOSIS — Z6837 Body mass index (BMI) 37.0-37.9, adult: Secondary | ICD-10-CM

## 2019-01-26 DIAGNOSIS — O09299 Supervision of pregnancy with other poor reproductive or obstetric history, unspecified trimester: Secondary | ICD-10-CM

## 2019-01-26 DIAGNOSIS — O0990 Supervision of high risk pregnancy, unspecified, unspecified trimester: Secondary | ICD-10-CM

## 2019-01-26 NOTE — Final Progress Note (Signed)
Physician Final Progress Note  Patient ID: Kimberly Farmer MRN: [id number] DOB/AGE: 1996/01/13 22 y.o.  Admit date: 01/26/2019 Admitting provider: Conard Novak, MD Discharge date: 01/26/2019   Admission Diagnoses:  1) intrauterine pregnancy at [redacted]w[redacted]d  2) decreased fetal movement, third trimester  Discharge Diagnoses:  1) intrauterine pregnancy at [redacted]w[redacted]d  2) decreased fetal movement, third trimester  History of Present Illness: The patient is a 24 y.o. female G3P1011 at [redacted]w[redacted]d who presents for decreased fetal movement since last night. She noted the usual amount of fetal movement. Since about 830PM last night she has not noted any fetal movement. She notes a bout of contractions yesterday, which resolved. She has no contractions currently. She denies vaginal bleeding and leakage of fluid.   Past Medical History:  Diagnosis Date  . Arthritis    Bilateral knees and back  . Lumbar disc disease   . PCOS (polycystic ovarian syndrome)     Past Surgical History:  Procedure Laterality Date  . KNEE SURGERY Left 2011   [name] has had 5 surgeries on her knee  . TONSILLECTOMY AND ADENOIDECTOMY  2007    No current facility-administered medications on file prior to encounter.    Current Outpatient Medications on File Prior to Encounter  Medication Sig Dispense Refill  . aspirin EC 81 MG tablet Take 1 tablet (81 mg total) by mouth daily. Take after 12 weeks for prevention of preeclampssia later in pregnancy 300 tablet 2  . folic acid (FOLVITE) 1 MG tablet Take 1 tablet (1 mg total) by mouth daily. 30 tablet 10  . Prenatal MV & Min w/FA-DHA (ONE A DAY PRENATAL PO) Take by mouth.      Allergies  Allergen Reactions  . Hydrocodone Other (See Comments)    Other Reaction: SEVERE HA & GI Upset  . Oxycodone Other (See Comments)    Other Reaction: SEVERE HA & GI Upset  . Tape Rash    Social History   Socioeconomic History  . Marital status: Media planner    Spouse name: Not on file   . Number of children: Not on file  . Years of education: Not on file  . Highest education level: Not on file  Occupational History  . Not on file  Social Needs  . Financial resource strain: Not hard at all  . Food insecurity:    Worry: Never true    Inability: Never true  . Transportation needs:    Medical: No    Non-medical: No  Tobacco Use  . Smoking status: Former Smoker    Types: E-cigarettes  . Smokeless tobacco: Former Neurosurgeon  . Tobacco comment: Vape - Reduced Nicotine  Substance and Sexual Activity  . Alcohol use: No  . Drug use: No  . Sexual activity: Not Currently    Partners: Male    Birth control/protection: Pill  Lifestyle  . Physical activity:    Days per week: 4 days    Minutes per session: 20 min  . Stress: Not at all  Relationships  . Social connections:    Talks on phone: Patient refused    Gets together: Patient refused    Attends religious service: Patient refused    Active member of club or organization: Patient refused    Attends meetings of clubs or organizations: Patient refused    Relationship status: Patient refused  . Intimate partner violence:    Fear of current or ex partner: No    Emotionally abused: No    Physically abused:  No    Forced sexual activity: No  Other Topics Concern  . Not on file  Social History Narrative  . Not on file    Family History  Problem Relation Age of Onset  . Hypertension Mother   . Alcohol abuse Father   . Diabetes Maternal Grandmother   . Hypertension Maternal Grandmother   . Cancer Paternal Grandfather        skin     Review of Systems  Constitutional: Negative.   HENT: Negative.   Eyes: Negative.   Respiratory: Negative.   Cardiovascular: Negative.   Gastrointestinal: Negative.   Genitourinary: Negative.   Musculoskeletal: Negative.   Skin: Negative.   Neurological: Negative.   Psychiatric/Behavioral: Negative.      Physical Exam: BP 127/81 (BP Location: Left Arm)   Pulse (!) 105    Temp 98 F (36.7 C) (Oral)   Resp 15   Ht 5\' 6"  (1.676 m)   Wt 120.2 kg   LMP 05/12/2018   BMI 42.77 kg/m   Physical Exam Constitutional:      General: She is not in acute distress.    Appearance: Normal appearance.  HENT:     Head: Normocephalic and atraumatic.  Eyes:     General: No scleral icterus.    Conjunctiva/sclera: Conjunctivae normal.  Neurological:     General: No focal deficit present.     Mental Status: She is alert and oriented to person, place, and time.     Cranial Nerves: No cranial nerve deficit.  Psychiatric:        Mood and Affect: Mood normal.        Behavior: Behavior normal.        Judgment: Judgment normal.     Consults: None  Significant Findings/ Diagnostic Studies: none  Procedures:  NST Baseline FHR: 140 beats/min Variability: moderate Accelerations: present Decelerations: a single short, shallow variable with quick return to baseline Tocometry: infrequent contractions  Interpretation:  INDICATIONS: decreased fetal movement RESULTS:  A NST procedure was performed with FHR monitoring and a normal baseline established, appropriate time of > 60 minutes of evaluation, and accels >2 seen w 15x15 characteristics.  Results show a REACTIVE NST.    Hospital Course: The patient was admitted to Labor and Delivery Triage for observation. She had normal vital signs and an easily reactive NST.  She had no other acute complaints. Given this, she was reassured regarding her fetal status.  She was, therefore, discharged to home in stable condition.  Discharge Condition: stable  Disposition: Discharge disposition: 01-Home or Self Care      home  Diet: Regular diet  Discharge Activity: Activity as tolerated   Allergies as of 01/26/2019      Reactions   Hydrocodone Other (See Comments)   Other Reaction: SEVERE HA & GI Upset   Oxycodone Other (See Comments)   Other Reaction: SEVERE HA & GI Upset   Tape Rash      Medication List    TAKE  these medications   aspirin EC 81 MG tablet Take 1 tablet (81 mg total) by mouth daily. Take after 12 weeks for prevention of preeclampssia later in pregnancy   folic acid 1 MG tablet Commonly known as:  FOLVITE Take 1 tablet (1 mg total) by mouth daily.   ONE A DAY PRENATAL PO Take by mouth.        Total time spent taking care of this patient: 30 minutes  Signed: Thomasene MohairStephen Sola Margolis, MD  01/26/2019, 10:45 AM

## 2019-01-26 NOTE — OB Triage Note (Signed)
Pt is a G3P1 at [redacted]w[redacted]d that presents with c/o decreased fetal movement since 2030 last night. Pt denies VB, LOF. Monitors applied and initial FHT 155. Will continue to monitor

## 2019-01-27 LAB — CERVICOVAGINAL ANCILLARY ONLY
Chlamydia: NEGATIVE
Neisseria Gonorrhea: NEGATIVE

## 2019-01-27 NOTE — Discharge Summary (Signed)
See final progress note. 

## 2019-02-05 ENCOUNTER — Ambulatory Visit (INDEPENDENT_AMBULATORY_CARE_PROVIDER_SITE_OTHER): Payer: 59 | Admitting: Obstetrics & Gynecology

## 2019-02-05 ENCOUNTER — Other Ambulatory Visit: Payer: Self-pay

## 2019-02-05 ENCOUNTER — Encounter: Payer: Self-pay | Admitting: Obstetrics & Gynecology

## 2019-02-05 DIAGNOSIS — O0990 Supervision of high risk pregnancy, unspecified, unspecified trimester: Secondary | ICD-10-CM

## 2019-02-05 DIAGNOSIS — O09293 Supervision of pregnancy with other poor reproductive or obstetric history, third trimester: Secondary | ICD-10-CM

## 2019-02-05 DIAGNOSIS — O99213 Obesity complicating pregnancy, third trimester: Secondary | ICD-10-CM

## 2019-02-05 DIAGNOSIS — Z3A38 38 weeks gestation of pregnancy: Secondary | ICD-10-CM

## 2019-02-05 DIAGNOSIS — O09299 Supervision of pregnancy with other poor reproductive or obstetric history, unspecified trimester: Secondary | ICD-10-CM

## 2019-02-05 NOTE — Progress Notes (Signed)
Virtual Visit via Telephone Note  I connected with@ on 02/05/19 at  8:20 AM EDT by telephone and verified that I am speaking with the correct person using two identifiers.   I discussed the limitations, risks, security and privacy concerns of performing an evaluation and management service by telephone and the availability of in person appointments. I also discussed with the patient that there may be a patient responsible charge related to this service. The patient expressed understanding and agreed to proceed.  The patient was at home  I spoke with the patient from my  office  Kimberly Farmer is a 23 y.o. G3P1011 at [redacted]w[redacted]d being seen today for ongoing prenatal care.  She is currently monitored for the following issues for this high-risk pregnancy and has Airway hyperreactivity; F/H of alcoholism; Broken leg; Osteochondritis dissecans of bilateral knees; Bone disease; Patellar instability; Chronic midline low back pain without sciatica; BMI 37.0-37.9, adult; PCOS (polycystic ovarian syndrome); Breakthrough bleeding on birth control pills; Supervision of high risk pregnancy, antepartum; History of pre-eclampsia in prior pregnancy, currently pregnant; Obesity affecting pregnancy; Choroid plexus cysts, fetal, affecting care of mother, antepartum; and Decreased fetal movement on their problem list.  ----------------------------------------------------------------------------------- Patient reports no complaints.  Some edema Denies pain, VB, leaking of fluid. Also denies ha, blurry vision, cp, sob, epigastric pain. ----------------------------------------------------------------------------------- The following portions of the patient's history were reviewed and updated as appropriate: allergies, current medications, past family history, past medical history, past social history, past surgical history and problem list. Problem list updated.  Objective  Last menstrual period 05/12/2018. Pregravid weight  225 lb (102.1 kg) Total Weight Gain 40 lb (18.1 kg)  Physical Exam could not be performed. Because of the COVID-19 outbreak this visit was performed over the phone and not in person.   Assessment   22 y.o. G3P1011 at [redacted]w[redacted]d by  02/16/2019, by Last Menstrual Period presenting for work-in prenatal visit  Plan   pregnancy 3 Problems (from 04/08/18 to present)    Problem Noted Resolved   Obesity affecting pregnancy 06/25/2018 by Conard Novak, MD No   Supervision of high risk pregnancy, antepartum 06/21/2018 by Nadara Mustard, MD No   Overview Addendum 01/08/2019  8:51 AM by Natale Milch, MD    Clinic [facility] Prenatal Labs  Dating LMP=6wk [place] Blood type: O/Positive/-- (09/06 1006)   Genetic Screen 1 Screen: Negative   AFP:     Antibody:Negative (09/06 1006)  Anatomic [place] Complete Rubella: <0.90 NON Immune Varicella:Immune   GTT Early:  98              Third trimester:  RPR: Non Reactive (09/06 1006)   Rhogam na HBsAg: Negative (09/06 1006)   TDaP vaccine   12/12/2018                     Flu Shot:08/01/18 HIV: Non Reactive (09/06 1006)   Baby Food  Breastfeeding                              GBS: pos  Contraception  considering pill Pap: 2019 NIL  CBB  no   CS/VBAC no   Support Person  fiancee- taylor           History of pre-eclampsia in prior pregnancy, currently pregnant 06/21/2018 by Nadara Mustard, MD No   Overview Signed 08/27/2018  8:35 AM by Natale Milch, MD    [ ]   ASA prescription sent, but patient has not started taking it yet because of the expense.       BMI 37.0-37.9, adult 10/11/2016 by [place] BingPickens, Charlie, MD No      Gestational age appropriate obstetric precautions including but not limited to vaginal bleeding, contractions, leaking of fluid and fetal movement were reviewed in detail with the patient.    Discussed pros and cons of IOL  Follow Up Instructions: 1 week w exam   I discussed the assessment and treatment plan with the patient. The  patient was provided an opportunity to ask questions and all were answered. The patient agreed with the plan and demonstrated an understanding of the instructions.   The patient was advised to call back or seek an in-person evaluation if the symptoms worsen or if the condition fails to improve as anticipated.  I provided 7 minutes of non-face-to-face time during this encounter.  Return in about 1 week (around 02/12/2019) for ROB office.  Annamarie MajorPaul Calirose Mccance, MD [facility], [facility] 02/05/2019 8:41 AM

## 2019-02-08 ENCOUNTER — Other Ambulatory Visit: Payer: Self-pay

## 2019-02-08 ENCOUNTER — Observation Stay
Admission: EM | Admit: 2019-02-08 | Discharge: 2019-02-09 | Disposition: A | Discharge status: Home / Self Care | Payer: Medicaid Other | Attending: Obstetrics and Gynecology | Admitting: Obstetrics and Gynecology

## 2019-02-08 DIAGNOSIS — O4693 Antepartum hemorrhage, unspecified, third trimester: Principal | ICD-10-CM | POA: Insufficient documentation

## 2019-02-08 DIAGNOSIS — O4690 Antepartum hemorrhage, unspecified, unspecified trimester: Secondary | ICD-10-CM | POA: Diagnosis present

## 2019-02-08 DIAGNOSIS — Z3A39 39 weeks gestation of pregnancy: Secondary | ICD-10-CM | POA: Insufficient documentation

## 2019-02-09 DIAGNOSIS — O4690 Antepartum hemorrhage, unspecified, unspecified trimester: Secondary | ICD-10-CM | POA: Diagnosis present

## 2019-02-09 DIAGNOSIS — O26853 Spotting complicating pregnancy, third trimester: Secondary | ICD-10-CM | POA: Diagnosis not present

## 2019-02-09 DIAGNOSIS — Z3A39 39 weeks gestation of pregnancy: Secondary | ICD-10-CM | POA: Diagnosis not present

## 2019-02-09 DIAGNOSIS — O4693 Antepartum hemorrhage, unspecified, third trimester: Secondary | ICD-10-CM | POA: Diagnosis not present

## 2019-02-09 NOTE — Discharge Summary (Signed)
Physician Discharge Summary   Patient ID: Kimberly Farmer [id number] 23 y.o. 01/03/1996  Admit date: 02/08/2019  Discharge date and time: No discharge date for patient encounter.   Admitting Physician: Natale Milchhristanna R Schuman, MD   Discharge Physician: Adelene Idlerhristanna Schuman MD  Admission Diagnoses: 39 wks preg bleeding  Discharge Diagnoses: Vaginal bleeding in the third trimester  Admission Condition: good  Discharged Condition: good  Indication for Admission: Evaluation in triage of vaginal bleeding in pregnancy  Hospital Course: Patient presented to labor and delivery. She had intercourse this evening and afterwards when she went to urinate there was a large amount of blood when she wiped. She has perceived an increase in vaginal discharge, perhaps leakage of fluid for several days. She is not feeling contractions. She is comfortable. No abdominal pain. No other symptoms, no pain with urination or urinary frequency. She is noticing fetal movement.  Fetus was reactive, category 1 tracing on the monitor. SVE 2/0/-3. No blood on examination. Bedside US showed cephalic infant, normal placenta, AFI 15.8cm.  Reassurance given, patient discharged home in stable condition.   NST: 120 bpm baseline, moderate variability, 15x15 accelerations, no decelerations. Tocometer : every 3-4 minutes   Consults: None  Significant Diagnostic Studies: none  Treatments: none  Discharge Exam: BP 129/83 (BP Location: Right Arm)   Pulse (!) 114   Resp 16   LMP 05/12/2018   General Appearance:    Alert, cooperative, no distress, appears stated age  Head:    Normocephalic, without obvious abnormality, atraumatic  Eyes:    PERRL, conjunctiva/corneas clear, EOM's intact, fundi    benign, both eyes  Ears:    Normal TM's and external ear canals, both ears  Nose:   Nares normal, septum midline, mucosa normal, no drainage    or sinus tenderness  Throat:   Lips, mucosa, and tongue normal; teeth and gums  normal  Neck:   Supple, symmetrical, trachea midline, no adenopathy;    thyroid:  no enlargement/tenderness/nodules; no carotid   bruit or JVD  Back:     Symmetric, no curvature, ROM normal, no CVA tenderness  Lungs:     Clear to auscultation bilaterally, respirations unlabored  Chest Wall:    No tenderness or deformity   Heart:    Regular rate and rhythm, S1 and S2 normal, no murmur, rub   or gallop  Breast Exam:    No tenderness, masses, or nipple abnormality  Abdomen:     Soft, non-tender, bowel sounds active all four quadrants,    no masses, no organomegaly  Genitalia:    Normal female without lesion, discharge or tenderness  Rectal:    Normal tone, normal prostate, no masses or tenderness;   guaiac negative stool  Extremities:   Extremities normal, atraumatic, no cyanosis or edema  Pulses:   2+ and symmetric all extremities  Skin:   Skin color, texture, turgor normal, no rashes or lesions  Lymph nodes:   Cervical, supraclavicular, and axillary nodes normal  Neurologic:   CNII-XII intact, normal strength, sensation and reflexes    throughout    Disposition: Discharge disposition: 01-Home or Self Care       Patient Instructions:  Allergies as of 02/09/2019      Reactions   Hydrocodone Other (See Comments)   Other Reaction: SEVERE HA & GI Upset   Oxycodone Other (See Comments)   Other Reaction: SEVERE HA & GI Upset   Tape Rash      Medication List    TAKE  these medications   aspirin EC 81 MG tablet Take 1 tablet (81 mg total) by mouth daily. Take after 12 weeks for prevention of preeclampssia later in pregnancy   folic acid 1 MG tablet Commonly known as:  FOLVITE Take 1 tablet (1 mg total) by mouth daily.   ONE A DAY PRENATAL PO Take by mouth.      Activity: activity as tolerated Diet: regular diet Wound Care: none needed  Follow-up with [facility] on Wednesday for prenatal appointment.   Signed: Natale Milch 02/09/2019 12:52 AM

## 2019-02-12 ENCOUNTER — Other Ambulatory Visit: Payer: Self-pay

## 2019-02-12 ENCOUNTER — Encounter: Payer: Self-pay | Admitting: Obstetrics & Gynecology

## 2019-02-12 ENCOUNTER — Ambulatory Visit (INDEPENDENT_AMBULATORY_CARE_PROVIDER_SITE_OTHER): Payer: 59 | Admitting: Obstetrics & Gynecology

## 2019-02-12 VITALS — BP 138/80 | Wt 275.0 lb

## 2019-02-12 DIAGNOSIS — Z3A39 39 weeks gestation of pregnancy: Secondary | ICD-10-CM

## 2019-02-12 DIAGNOSIS — O09293 Supervision of pregnancy with other poor reproductive or obstetric history, third trimester: Secondary | ICD-10-CM

## 2019-02-12 DIAGNOSIS — O0990 Supervision of high risk pregnancy, unspecified, unspecified trimester: Secondary | ICD-10-CM

## 2019-02-12 DIAGNOSIS — O99213 Obesity complicating pregnancy, third trimester: Secondary | ICD-10-CM

## 2019-02-12 DIAGNOSIS — O09299 Supervision of pregnancy with other poor reproductive or obstetric history, unspecified trimester: Secondary | ICD-10-CM

## 2019-02-12 NOTE — Progress Notes (Signed)
  [facility]IONAL BIRTHPLACE INDUCTION ASSESSMENT SCHEDULING Kimberly Farmer 01/29/1996 Medical record #: [id number] Phone #:  Home Phone [phone]  Mobile [phone]   Prenatal Provider:[facility] Delivering [facility] Proposed admission date/time:02/21/2019 at 0800 Method of induction:Pitocin  Weight: Filed Weights04/29/20 1049Weight:275 lb (124.7 kg) BMI Body mass index is 44.39 kg/m. HIV Negative HSV Negative EDC Estimated Date of Delivery: 5/3/20based on:US at [redacted] wks  Gestational age on admission: 40 5/7 Gravidity/parity:G3P1011  Cervix Score   0 1 2 3   Position Posterior Midposition Anterior   Consistency Firm Medium Soft   Effacement (%) 0-30 40-50 60-70 >80  Dilation (cm) Closed 1-2 3-4 >5  Baby's station -3 -2 -1 +1, +2   Bishop Score:5  select indication(s) below Elective induction ?39 weeks multiparous patient  Medical Indications Adapted from ACOG Committee Opinion #[id number], "Medically Indicated Late Preterm and Early Term Deliveries," 2013.  Provider Signature: Letitia Libra  Scheduled WP:[id number] BROWN Date:02/12/2019 11:13 AM   Call [phone] to finalize the induction date/time  [id number] (07/17)

## 2019-02-12 NOTE — Progress Notes (Signed)
  Subjective  Fetal Movement? yes Contractions? Occas B-H's Leaking Fluid? no Vaginal Bleeding? Last weekend but none now    (was seen, told she was 3 cm in hospital) Objective  BP 138/80   Wt 275 lb (124.7 kg)   LMP 05/12/2018   BMI 44.39 kg/m  General: NAD Pumonary: no increased work of breathing Abdomen: gravid, non-tender Extremities: no edema Psychiatric: mood appropriate, affect full SVE: 3cm ext os, 1 cm int os; 60%, -3, post, soft Assessment  23 y.o. G3P1011 at [redacted]w[redacted]d by  02/16/2019, by Last Menstrual Period presenting for routine prenatal visit  Plan   Problem List Items Addressed This Visit      Other   Supervision of high risk pregnancy, antepartum   History of pre-eclampsia in prior pregnancy, currently pregnant   Obesity affecting pregnancy    Other Visit Diagnoses    [redacted] weeks gestation of pregnancy    -  Primary      pregnancy 3 Problems (from 04/08/18 to present)    Problem Noted Resolved   Obesity affecting pregnancy 06/25/2018 by Conard Novak, MD No   Supervision of high risk pregnancy, antepartum 06/21/2018 by Nadara Mustard, MD No   Overview Addendum 01/08/2019  8:51 AM by Natale Milch, MD    Clinic [facility] Prenatal Labs  Dating LMP=6wk [place] Blood type: O/Positive/-- (09/06 1006)   Genetic Screen 1 Screen: Negative   AFP:     Antibody:Negative (09/06 1006)  Anatomic [place] Complete Rubella: <0.90 NON Immune Varicella:Immune   GTT Early:  98              Third trimester:  RPR: Non Reactive (09/06 1006)   Rhogam na HBsAg: Negative (09/06 1006)   TDaP vaccine   12/12/2018                     Flu Shot:08/01/18 HIV: Non Reactive (09/06 1006)   Baby Food  Breastfeeding                              GBS: POS  Contraception  Undecided- considering pill vs. nuvaring? Pap: 2019 NIL  CBB  No   CS/VBAC Na   Support Person            History of pre-eclampsia in prior pregnancy, currently pregnant 06/21/2018 by Nadara Mustard, MD No   Overview  Signed 08/27/2018  8:35 AM by Natale Milch, MD    [ ]   ASA prescription sent, but patient has not started taking it yet because of the expense.       BMI 37.0-37.9, adult 10/11/2016 by [place] Bing, MD No     IOL discussed as possibility for postdates    (sch for 5/8 at 0800 if needed)  Annamarie Major, MD, [facility], [facility] 02/12/2019  11:19 AM

## 2019-02-18 ENCOUNTER — Other Ambulatory Visit: Payer: Self-pay | Admitting: Obstetrics & Gynecology

## 2019-02-18 NOTE — Progress Notes (Signed)
Telephone d/w patient: Induction planned for Friday at 0800    Orders placed    H&P visit Wed am, changed to 0830 to accommodate testing time Covid testing scheduled for Wed between 9-10    Order placed She is agreeable to this plan  Annamarie Major, MD, [facility], [facility] 02/18/2019  8:45 AM

## 2019-02-19 ENCOUNTER — Ambulatory Visit
Admission: RE | Admit: 2019-02-19 | Discharge: 2019-02-19 | Disposition: A | Discharge status: Home / Self Care | Payer: Medicaid Other | Source: Ambulatory Visit | Attending: Obstetrics & Gynecology | Admitting: Obstetrics & Gynecology

## 2019-02-19 ENCOUNTER — Encounter: Payer: Self-pay | Admitting: Maternal Newborn

## 2019-02-19 ENCOUNTER — Ambulatory Visit (INDEPENDENT_AMBULATORY_CARE_PROVIDER_SITE_OTHER): Payer: 59 | Admitting: Maternal Newborn

## 2019-02-19 ENCOUNTER — Other Ambulatory Visit: Payer: Self-pay

## 2019-02-19 VITALS — BP 120/72 | Wt 278.0 lb

## 2019-02-19 DIAGNOSIS — O0990 Supervision of high risk pregnancy, unspecified, unspecified trimester: Secondary | ICD-10-CM

## 2019-02-19 DIAGNOSIS — Z3A40 40 weeks gestation of pregnancy: Secondary | ICD-10-CM

## 2019-02-19 DIAGNOSIS — Z01812 Encounter for preprocedural laboratory examination: Secondary | ICD-10-CM

## 2019-02-19 DIAGNOSIS — O480 Post-term pregnancy: Secondary | ICD-10-CM

## 2019-02-19 LAB — POCT URINALYSIS DIPSTICK OB: Glucose, UA: NEGATIVE

## 2019-02-19 NOTE — Patient Instructions (Signed)
Labor Induction    Labor induction is when steps are taken to cause a pregnant woman to begin the labor process. Most women go into labor on their own between 37 weeks and 42 weeks of pregnancy. When this does not happen or when there is a medical need for labor to begin, steps may be taken to induce labor. Labor induction causes a pregnant woman's uterus to contract. It also causes the cervix to soften (ripen), open (dilate), and thin out (efface). Usually, labor is not induced before 39 weeks of pregnancy unless there is a medical reason to do so. Your health care provider will determine if labor induction is needed.  Before inducing labor, your health care provider will consider a number of factors, including:  · Your medical condition and your baby's.  · How many weeks along you are in your pregnancy.  · How mature your baby's lungs are.  · The condition of your cervix.  · The position of your baby.  · The size of your birth canal.  What are some reasons for labor induction?  Labor may be induced if:  · Your health or your baby's health is at risk.  · Your pregnancy is overdue by 1 week or more.  · Your water breaks but labor does not start on its own.  · There is a low amount of amniotic fluid around your baby.  You may also choose (elect) to have labor induced at a certain time. Generally, elective labor induction is done no earlier than 39 weeks of pregnancy.  What methods are used for labor induction?  Methods used for labor induction include:  · Prostaglandin medicine. This medicine starts contractions and causes the cervix to dilate and ripen. It can be taken by mouth (orally) or by being inserted into the vagina (suppository).  · Inserting a small, thin tube (catheter) with a balloon into the vagina and then expanding the balloon with water to dilate the cervix.  · Stripping the membranes. In this method, your health care provider gently separates amniotic sac tissue from the cervix. This causes the  cervix to stretch, which in turn causes the release of a hormone called progesterone. The hormone causes the uterus to contract. This procedure is often done during an office visit, after which you will be sent home to wait for contractions to begin.  · Breaking the water. In this method, your health care provider uses a small instrument to make a small hole in the amniotic sac. This eventually causes the amniotic sac to break. Contractions should begin after a few hours.  · Medicine to trigger or strengthen contractions. This medicine is given through an IV that is inserted into a vein in your arm.  Except for membrane stripping, which can be done in a clinic, labor induction is done in the hospital so that you and your baby can be carefully monitored.  How long does it take for labor to be induced?  The length of time it takes to induce labor depends on how ready your body is for labor. Some inductions can take up to 2-3 days, while others may take less than a day. Induction may take longer if:  · You are induced early in your pregnancy.  · It is your first pregnancy.  · Your cervix is not ready.  What are some risks associated with labor induction?  Some risks associated with labor induction include:  · Changes in fetal heart rate, such as being too   high, too low, or irregular (erratic).  · Failed induction.  · Infection in the mother or the baby.  · Increased risk of having a cesarean delivery.  · Fetal death.  · Breaking off (abruption) of the placenta from the uterus (rare).  · Rupture of the uterus (very rare).  When induction is needed for medical reasons, the benefits of induction generally outweigh the risks.  What are some reasons for not inducing labor?  Labor induction should not be done if:  · Your baby does not tolerate contractions.  · You have had previous surgeries on your uterus, such as a myomectomy, removal of fibroids, or a vertical scar from a previous cesarean delivery.  · Your placenta lies  very low in your uterus and blocks the opening of the cervix (placenta previa).  · Your baby is not in a head-down position.  · The umbilical cord drops down into the birth canal in front of the baby.  · There are unusual circumstances, such as the baby being very early (premature).  · You have had more than 2 previous cesarean deliveries.  Summary  · Labor induction is when steps are taken to cause a pregnant woman to begin the labor process.  · Labor induction causes a pregnant woman's uterus to contract. It also causes the cervix to ripen, dilate, and efface.  · Labor is not induced before 39 weeks of pregnancy unless there is a medical reason to do so.  · When induction is needed for medical reasons, the benefits of induction generally outweigh the risks.  This information is not intended to replace advice given to you by your health care provider. Make sure you discuss any questions you have with your health care provider.  Document Released: 02/21/2007 Document Revised: 11/15/2016 Document Reviewed: 11/15/2016  Elsevier Interactive Patient Education © 2019 [employer] Inc[employer]

## 2019-02-19 NOTE — Progress Notes (Signed)
Obstetrics Admission History & Physical   Scheduled induction of labor  HPI:  23 y.o. [id number] @ 5838w3d (02/16/2019, by Last Menstrual Period). Admitted on (Not on file):   Patient Active Problem List   Diagnosis Date Noted  . Choroid plexus cysts, fetal, affecting care of mother, antepartum 09/17/2018  . Obesity affecting pregnancy 06/25/2018  . Supervision of high risk pregnancy, antepartum 06/21/2018  . History of pre-eclampsia in prior pregnancy, currently pregnant 06/21/2018  . Breakthrough bleeding on birth control pills 05/16/2018  . PCOS (polycystic ovarian syndrome) 03/05/2018  . BMI 37.0-37.9, adult 10/11/2016  . Chronic midline low back pain without sciatica 04/24/2016  . 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 F/H of alcoholism 09/27/2015  . Bone disease 09/24/2014  . 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     Presents for H&P for scheduled induction of labor on 02/21/2019 at 0800; elective with favorable cervix at term.   She has been having some irregular contractions, not very painful and similar to Braxton-Hicks, more at night. She is not having any vaginal bleeding or loss of fluid. She reports a feeling of pelvic pressure. Her baby is moving well. No headaches, visual changes, or epigastric pain. Denies shortness of breath, cough, and difficulty breathing.  [facility]. Pregnancy complicated by none.  ROS: A review of systems was performed and negative, except as stated in the above HPI.  PMHx:  Past Medical History:  Diagnosis Date  . Arthritis    Bilateral knees and back  . Lumbar disc disease   . PCOS (polycystic ovarian syndrome)    PSHx:  Past Surgical History:  Procedure Laterality Date  . KNEE SURGERY Left 2011   [name] has had 5 surgeries on her knee  . TONSILLECTOMY AND ADENOIDECTOMY  2007   Medications: (Not in a hospital admission)  Allergies: is allergic to hydrocodone; oxycodone;  and tape. OBHx:  OB History  Gravida Para Term Preterm AB Living  3 1 1  0 1 1  SAB TAB Ectopic Multiple Live Births  1 0 0 0 1    # Outcome Date GA Lbr Len/2nd Weight Sex Delivery Anes PTL Lv  3 Current           2 Term 04/28/17 7969w3d 02:57 / 03:53 7 lb 14.8 oz (3.595 kg) M Vag-Spont EPI  LIV  1 SAB 10/16/14 2959w0d    SAB      FHx:  Family History  Problem Relation Age of Onset  . Hypertension Mother   . Alcohol abuse Father   . Diabetes Maternal Grandmother   . Hypertension Maternal Grandmother   . Cancer Paternal Grandfather        skin   Soc Hx: Former smoker, Alcohol: none and Recreational drug use: none  Objective:   Vitals:   02/19/19 0823  BP: 120/72   Constitutional: Well nourished, well developed female in no acute distress.  HEENT: normal Skin: Warm and dry.  Cardiovascular: Regular rate and rhythm.   Extremity: trace to 1+ bilateral pedal edema Respiratory: Clear to auscultation bilaterally. Normal respiratory effort Abdomen: gravid, non-tender Neuro: Cranial nerves grossly intact Psych: Alert and Oriented x3. No memory deficits. Normal mood and affect.  MS: normal gait, normal bilateral lower extremity ROM/strength/stability.  Pelvic exam: Declines, last exam on 4/29 was 1/60/-3  FHT 145-155 via Doppler auscultation.   Perinatal info:  Blood type: O positive Rubella - Not immune Varicella - Immune TDaP Given during third trimester of  this pregnancy on 12/12/2018 RPR NR / HIV Neg/ HBsAg Neg   Assessment & Plan:   23 y.o. G3P1011 @ [redacted]w[redacted]d, Admitted for Induction of labor.    Antibiotics for GBS prophylaxis, Observe for cervical change, Epidural when ready and AROM when Appropriate  Marcelyn Bruins, CNM [facility], [facility] 02/19/2019  9:17 AM

## 2019-02-19 NOTE — Progress Notes (Signed)
Routine Prenatal Care Visit  Subjective  Kimberly Farmer is a 23 y.o. G3P1011 at [redacted]w[redacted]d being seen today for ongoing prenatal care.  She is currently monitored for the following issues for this high-risk pregnancy and has Airway hyperreactivity; F/H of alcoholism; Broken leg; Osteochondritis dissecans of bilateral knees; Bone disease; Patellar instability; Chronic midline low back pain without sciatica; BMI 37.0-37.9, adult; PCOS (polycystic ovarian syndrome); Breakthrough bleeding on birth control pills; Supervision of high risk pregnancy, antepartum; History of pre-eclampsia in prior pregnancy, currently pregnant; Obesity affecting pregnancy; Choroid plexus cysts, fetal, affecting care of mother, antepartum; Decreased fetal movement; and Vaginal bleeding in pregnancy on their problem list. -------------------------------------------------------------------------------------------- Patient reports pelvic pressure, some contractions but mostly feel like Braxton-Hicks.   Contractions: Irregular. Vag. Bleeding: None.  Movement: Present. No leaking of fluid.  ------------------------------------------------------------------------------------------- The following portions of the patient's history were reviewed and updated as appropriate: allergies, current medications, past family history, past medical history, past social history, past surgical history and problem list. Problem list updated.  Objective  Blood pressure 120/72, weight 278 lb (126.1 kg), last menstrual period 05/12/2018, not currently breastfeeding. Pregravid weight 225 lb (102.1 kg) Total Weight Gain 53 lb (24 kg) Urinalysis: Urine dipstick shows negative for glucose, positive for protein (trace). Fetal Status: Fetal Heart Rate (bpm): 145   Movement: Present     General:  Alert, oriented and cooperative. Patient is in no acute distress.  Skin: Skin is warm and dry. No rash noted.   Cardiovascular: Normal heart rate noted   Respiratory: Normal respiratory effort, no problems with respiration noted  Abdomen: Soft, gravid, appropriate for gestational age. Pain/Pressure: Present     Pelvic:  Cervical exam deferred        Extremities: Normal range of motion.     Mental Status: Normal mood and affect. Normal behavior. Normal judgment and thought content.     Assessment   22 y.o. G3P1011 at [redacted]w[redacted]d EDD 02/16/2019, by Last Menstrual Period presenting for a routine prenatal visit.  Plan   pregnancy 3 Problems (from 04/08/18 to present)    Problem Noted Resolved   Obesity affecting pregnancy 06/25/2018 by Conard Novak, MD No   Supervision of high risk pregnancy, antepartum 06/21/2018 by Nadara Mustard, MD No   Overview Addendum 01/08/2019  8:51 AM by Natale Milch, MD    [facility]inic [facility] Prenatal Labs  Dating LMP=6wk [place] Blood type: O/Positive/-- (09/06 1006)   Genetic Screen 1 Screen: Negative   AFP:     Antibody:Negative (09/06 1006)  Anatomic [place] Complete Rubella: <0.90 NON Immune Varicella:Immune   GTT Early:  98              Third trimester:  RPR: Non Reactive (09/06 1006)   Rhogam na HBsAg: Negative (09/06 1006)   TDaP vaccine   12/12/2018                     Flu Shot:08/01/18 HIV: Non Reactive (09/06 1006)   Baby Food  Breastfeeding                              GBS:   Contraception  Undecided- considering pill vs. nuvaring? Pap: 2019 NIL  CBB     CS/VBAC    Support Person            History of pre-eclampsia in prior pregnancy, currently pregnant 06/21/2018 by Nadara Mustard, MD No  Overview Signed 08/27/2018  8:35 AM by Natale MilchSchuman, Christanna R, MD    [ ]   ASA prescription sent, but patient has not started taking it yet because of the expense.       BMI 37.0-37.9, adult 10/11/2016 by [place] BingPickens, Charlie, MD No    She is going to the outpatient testing site this morning for COVID-19 screening.  Term labor symptoms and general obstetric precautions including but not limited to vaginal  bleeding, contractions, leaking of fluid and fetal movement were reviewed.  Please refer to After Visit Summary for other counseling recommendations.   IOL scheduled 02/21/2019 at 0800, see separate note for H&P.  Marcelyn BruinsJacelyn Jadon Ressler, CNM 02/19/2019

## 2019-02-21 ENCOUNTER — Ambulatory Visit: Payer: Medicaid Other

## 2019-02-21 ENCOUNTER — Inpatient Hospital Stay
Admission: RE | Admit: 2019-02-21 | Discharge: 2019-02-23 | DRG: 787 | Disposition: A | Discharge status: Home / Self Care | Payer: Medicaid Other | Attending: Obstetrics & Gynecology | Admitting: Obstetrics & Gynecology

## 2019-02-21 ENCOUNTER — Inpatient Hospital Stay: Payer: Medicaid Other | Admitting: Anesthesiology

## 2019-02-21 ENCOUNTER — Encounter
Admission: RE | Disposition: A | Discharge status: Home / Self Care | Payer: Self-pay | Source: Home / Self Care | Attending: Obstetrics & Gynecology

## 2019-02-21 ENCOUNTER — Other Ambulatory Visit: Payer: Self-pay

## 2019-02-21 DIAGNOSIS — Z3A40 40 weeks gestation of pregnancy: Secondary | ICD-10-CM

## 2019-02-21 DIAGNOSIS — O480 Post-term pregnancy: Principal | ICD-10-CM | POA: Diagnosis present

## 2019-02-21 DIAGNOSIS — O0990 Supervision of high risk pregnancy, unspecified, unspecified trimester: Secondary | ICD-10-CM

## 2019-02-21 DIAGNOSIS — O339 Maternal care for disproportion, unspecified: Secondary | ICD-10-CM | POA: Clinically undetermined

## 2019-02-21 DIAGNOSIS — Z6837 Body mass index (BMI) 37.0-37.9, adult: Secondary | ICD-10-CM

## 2019-02-21 DIAGNOSIS — O09299 Supervision of pregnancy with other poor reproductive or obstetric history, unspecified trimester: Secondary | ICD-10-CM

## 2019-02-21 DIAGNOSIS — Z98891 History of uterine scar from previous surgery: Secondary | ICD-10-CM

## 2019-02-21 DIAGNOSIS — D62 Acute posthemorrhagic anemia: Secondary | ICD-10-CM | POA: Diagnosis not present

## 2019-02-21 DIAGNOSIS — O99214 Obesity complicating childbirth: Secondary | ICD-10-CM | POA: Diagnosis present

## 2019-02-21 DIAGNOSIS — O99213 Obesity complicating pregnancy, third trimester: Secondary | ICD-10-CM

## 2019-02-21 DIAGNOSIS — O99824 Streptococcus B carrier state complicating childbirth: Secondary | ICD-10-CM | POA: Diagnosis present

## 2019-02-21 DIAGNOSIS — Z20828 Contact with and (suspected) exposure to other viral communicable diseases: Secondary | ICD-10-CM | POA: Diagnosis present

## 2019-02-21 DIAGNOSIS — O9081 Anemia of the puerperium: Secondary | ICD-10-CM | POA: Diagnosis not present

## 2019-02-21 HISTORY — DX: Post-term pregnancy: O48.0

## 2019-02-21 LAB — COMPREHENSIVE METABOLIC PANEL
ALT: 9 U/L (ref 0–44)
AST: 19 U/L (ref 15–41)
Albumin: 2.8 g/dL — ABNORMAL LOW (ref 3.5–5.0)
Alkaline Phosphatase: 153 U/L — ABNORMAL HIGH (ref 38–126)
Anion gap: 10 (ref 5–15)
BUN: 7 mg/dL (ref 6–20)
CO2: 18 mmol/L — ABNORMAL LOW (ref 22–32)
Calcium: 8.6 mg/dL — ABNORMAL LOW (ref 8.9–10.3)
Chloride: 108 mmol/L (ref 98–111)
Creatinine, Ser: 0.56 mg/dL (ref 0.44–1.00)
GFR calc Af Amer: >60 mL/min (ref >60)
GFR calc non Af Amer: >60 mL/min (ref >60)
Glucose, Bld: 139 mg/dL — ABNORMAL HIGH (ref 70–99)
Potassium: 3.4 mmol/L — ABNORMAL LOW (ref 3.5–5.1)
Sodium: 136 mmol/L (ref 135–145)
Total Bilirubin: 0.3 mg/dL (ref 0.3–1.2)
Total Protein: 6.8 g/dL (ref 6.5–8.1)

## 2019-02-21 LAB — CBC
HCT: 35.1 % — ABNORMAL LOW (ref 36.0–46.0)
Hemoglobin: 11.1 g/dL — ABNORMAL LOW (ref 12.0–15.0)
MCH: 24.1 pg — ABNORMAL LOW (ref 26.0–34.0)
MCHC: 31.6 g/dL (ref 30.0–36.0)
MCV: 76.1 fL — ABNORMAL LOW (ref 80.0–100.0)
Platelets: 334 10*3/uL (ref 150–400)
RBC: 4.61 MIL/uL (ref 3.87–5.11)
RDW: 14.2 % (ref 11.5–15.5)
WBC: 10.2 10*3/uL (ref 4.0–10.5)
nRBC: 0 % (ref 0.0–0.2)

## 2019-02-21 LAB — TYPE AND SCREEN
ABO/RH(D): O POS
Antibody Screen: NEGATIVE

## 2019-02-21 LAB — PROTEIN / CREATININE RATIO, URINE
Creatinine, Urine: 146 mg/dL
Protein Creatinine Ratio: 0.09 mg/mg{Cre} (ref 0.00–0.15)
Total Protein, Urine: 13 mg/dL

## 2019-02-21 SURGERY — Surgical Case
Anesthesia: Epidural

## 2019-02-21 MED ORDER — BUPIVACAINE HCL 0.5 % IJ SOLN
10.0000 mL | Freq: Once | INTRAMUSCULAR | Status: DC
  Filled 2019-02-21: qty 10

## 2019-02-21 MED ORDER — SODIUM CHLORIDE 0.9 % IV SOLN
INTRAVENOUS | Status: AC
  Filled 2019-02-21: qty 2

## 2019-02-21 MED ORDER — NALBUPHINE HCL 10 MG/ML IJ SOLN: 5.0000 mg | Freq: Once | INTRAMUSCULAR | Status: AC | PRN

## 2019-02-21 MED ORDER — LACTATED RINGERS IV SOLN
500.0000 mL | Freq: Once | INTRAVENOUS | Status: AC
  Administered 2019-02-21: 250 mL via INTRAVENOUS

## 2019-02-21 MED ORDER — DIPHENHYDRAMINE HCL 50 MG/ML IJ SOLN: 12.5000 mg | INTRAMUSCULAR | Status: DC | PRN

## 2019-02-21 MED ORDER — MISOPROSTOL 200 MCG PO TABS
ORAL_TABLET | ORAL | Status: AC
  Filled 2019-02-21: qty 4

## 2019-02-21 MED ORDER — MORPHINE SULFATE (PF) 0.5 MG/ML IJ SOLN
INTRAMUSCULAR | Status: DC | PRN
  Administered 2019-02-21: .1 mg via INTRATHECAL

## 2019-02-21 MED ORDER — ONDANSETRON HCL 4 MG/2ML IJ SOLN: 4.0000 mg | Freq: Three times a day (TID) | INTRAMUSCULAR | Status: DC | PRN

## 2019-02-21 MED ORDER — MEPERIDINE HCL 25 MG/ML IJ SOLN: 6.2500 mg | INTRAMUSCULAR | Status: DC | PRN

## 2019-02-21 MED ORDER — LIDOCAINE HCL (PF) 1 % IJ SOLN
30.0000 mL | INTRAMUSCULAR | Status: DC | PRN
  Filled 2019-02-21: qty 30

## 2019-02-21 MED ORDER — OXYCODONE-ACETAMINOPHEN 5-325 MG PO TABS: 1.0000 | ORAL_TABLET | ORAL | Status: DC | PRN

## 2019-02-21 MED ORDER — SIMETHICONE 80 MG PO CHEW
80.0000 mg | CHEWABLE_TABLET | Freq: Three times a day (TID) | ORAL | Status: DC
  Administered 2019-02-22 – 2019-02-23 (×5): 80 mg via ORAL
  Filled 2019-02-21 (×5): qty 1

## 2019-02-21 MED ORDER — ACETAMINOPHEN 325 MG PO TABS: 650.0000 mg | ORAL_TABLET | ORAL | Status: DC | PRN

## 2019-02-21 MED ORDER — IBUPROFEN 800 MG PO TABS: 800.0000 mg | ORAL_TABLET | Freq: Four times a day (QID) | ORAL | Status: DC

## 2019-02-21 MED ORDER — LIDOCAINE HCL (PF) 1 % IJ SOLN
INTRAMUSCULAR | Status: DC | PRN
  Administered 2019-02-21 (×2): 1 mL

## 2019-02-21 MED ORDER — PHENYLEPHRINE 40 MCG/ML (10ML) SYRINGE FOR IV PUSH (FOR BLOOD PRESSURE SUPPORT): 80.0000 ug | PREFILLED_SYRINGE | INTRAVENOUS | Status: DC | PRN

## 2019-02-21 MED ORDER — OXYTOCIN 40 UNITS IN NORMAL SALINE INFUSION - SIMPLE MED: 2.5000 [IU]/h | INTRAVENOUS | Status: AC

## 2019-02-21 MED ORDER — EPHEDRINE 5 MG/ML INJ: 10.0000 mg | INTRAVENOUS | Status: DC | PRN

## 2019-02-21 MED ORDER — FENTANYL 2.5 MCG/ML W/ROPIVACAINE 0.15% IN NS 100 ML EPIDURAL (ARMC)
12.0000 mL/h | EPIDURAL | Status: DC
  Administered 2019-02-21: 12 mL/h via EPIDURAL

## 2019-02-21 MED ORDER — SIMETHICONE 80 MG PO CHEW
80.0000 mg | CHEWABLE_TABLET | ORAL | Status: DC | PRN
  Filled 2019-02-21: qty 1

## 2019-02-21 MED ORDER — OXYTOCIN 40 UNITS IN NORMAL SALINE INFUSION - SIMPLE MED
1.0000 m[IU]/min | INTRAVENOUS | Status: DC
  Administered 2019-02-21: 2 m[IU]/min via INTRAVENOUS
  Filled 2019-02-21: qty 1000

## 2019-02-21 MED ORDER — SOD CITRATE-CITRIC ACID 500-334 MG/5ML PO SOLN
ORAL | Status: AC
  Filled 2019-02-21: qty 30

## 2019-02-21 MED ORDER — MORPHINE SULFATE (PF) 2 MG/ML IV SOLN: 1.0000 mg | INTRAVENOUS | Status: DC | PRN

## 2019-02-21 MED ORDER — DIPHENHYDRAMINE HCL 25 MG PO CAPS: 25.0000 mg | ORAL_CAPSULE | ORAL | Status: DC | PRN

## 2019-02-21 MED ORDER — MENTHOL 3 MG MT LOZG
1.0000 | LOZENGE | OROMUCOSAL | Status: DC | PRN
  Filled 2019-02-21: qty 9

## 2019-02-21 MED ORDER — SODIUM CHLORIDE 0.9 % IV SOLN
INTRAVENOUS | Status: DC | PRN
  Administered 2019-02-21: 50 ug/min via INTRAVENOUS

## 2019-02-21 MED ORDER — FENTANYL 2.5 MCG/ML W/ROPIVACAINE 0.15% IN NS 100 ML EPIDURAL (ARMC)
EPIDURAL | Status: AC
  Filled 2019-02-21: qty 100

## 2019-02-21 MED ORDER — SODIUM CHLORIDE 0.9 % IV SOLN
2.0000 g | INTRAVENOUS | Status: AC
  Administered 2019-02-21: 2 g via INTRAVENOUS

## 2019-02-21 MED ORDER — NALBUPHINE HCL 10 MG/ML IJ SOLN
5.0000 mg | INTRAMUSCULAR | Status: DC | PRN
  Filled 2019-02-21: qty 1

## 2019-02-21 MED ORDER — BUPIVACAINE HCL (PF) 0.5 % IJ SOLN
INTRAMUSCULAR | Status: AC
  Filled 2019-02-21: qty 30

## 2019-02-21 MED ORDER — MEASLES, MUMPS & RUBELLA VAC IJ SOLR
0.5000 mL | Freq: Once | INTRAMUSCULAR | Status: AC
  Administered 2019-02-23: 0.5 mL via SUBCUTANEOUS
  Filled 2019-02-21 (×2): qty 0.5

## 2019-02-21 MED ORDER — KETOROLAC TROMETHAMINE 30 MG/ML IJ SOLN
30.0000 mg | Freq: Four times a day (QID) | INTRAMUSCULAR | Status: AC
  Administered 2019-02-22 (×3): 30 mg via INTRAVENOUS
  Filled 2019-02-21 (×3): qty 1

## 2019-02-21 MED ORDER — OXYTOCIN 40 UNITS IN NORMAL SALINE INFUSION - SIMPLE MED
INTRAVENOUS | Status: AC
  Filled 2019-02-21: qty 1000

## 2019-02-21 MED ORDER — LACTATED RINGERS IV SOLN
500.0000 mL | INTRAVENOUS | Status: DC | PRN
  Administered 2019-02-21: 13:00:00 1000 mL via INTRAVENOUS

## 2019-02-21 MED ORDER — ONDANSETRON HCL 4 MG/2ML IJ SOLN
INTRAMUSCULAR | Status: DC | PRN
  Administered 2019-02-21: 4 mg via INTRAVENOUS

## 2019-02-21 MED ORDER — MORPHINE SULFATE (PF) 0.5 MG/ML IJ SOLN
INTRAMUSCULAR | Status: AC
  Filled 2019-02-21: qty 10

## 2019-02-21 MED ORDER — TERBUTALINE SULFATE 1 MG/ML IJ SOLN
0.2500 mg | Freq: Once | INTRAMUSCULAR | Status: DC | PRN
  Filled 2019-02-21: qty 1

## 2019-02-21 MED ORDER — FENTANYL CITRATE (PF) 100 MCG/2ML IJ SOLN: 25.0000 ug | INTRAMUSCULAR | Status: DC | PRN

## 2019-02-21 MED ORDER — NALOXONE HCL 4 MG/10ML IJ SOLN
1.0000 ug/kg/h | INTRAVENOUS | Status: DC | PRN
  Filled 2019-02-21: qty 5

## 2019-02-21 MED ORDER — DIBUCAINE (PERIANAL) 1 % EX OINT: 1.0000 "application " | TOPICAL_OINTMENT | CUTANEOUS | Status: DC | PRN

## 2019-02-21 MED ORDER — WITCH HAZEL-GLYCERIN EX PADS: 1.0000 "application " | MEDICATED_PAD | CUTANEOUS | Status: DC | PRN

## 2019-02-21 MED ORDER — BUTORPHANOL TARTRATE 2 MG/ML IJ SOLN: 1.0000 mg | INTRAMUSCULAR | Status: DC | PRN

## 2019-02-21 MED ORDER — SODIUM CHLORIDE 0.9 % IV SOLN
1.0000 g | INTRAVENOUS | Status: DC
  Administered 2019-02-21 (×2): 1 g via INTRAVENOUS
  Filled 2019-02-21 (×6): qty 1000

## 2019-02-21 MED ORDER — TERBUTALINE SULFATE 1 MG/ML IJ SOLN: 0.2500 mg | Freq: Once | INTRAMUSCULAR | Status: DC | PRN

## 2019-02-21 MED ORDER — LACTATED RINGERS IV SOLN: INTRAVENOUS | Status: DC

## 2019-02-21 MED ORDER — SENNOSIDES-DOCUSATE SODIUM 8.6-50 MG PO TABS
2.0000 | ORAL_TABLET | ORAL | Status: DC
  Administered 2019-02-22: 2 via ORAL
  Filled 2019-02-21 (×2): qty 2

## 2019-02-21 MED ORDER — FENTANYL CITRATE (PF) 100 MCG/2ML IJ SOLN
INTRAMUSCULAR | Status: DC | PRN
  Administered 2019-02-21: 15 ug via INTRAVENOUS

## 2019-02-21 MED ORDER — MISOPROSTOL 25 MCG QUARTER TABLET: 25.0000 ug | ORAL_TABLET | ORAL | Status: DC | PRN

## 2019-02-21 MED ORDER — OXYTOCIN 40 UNITS IN NORMAL SALINE INFUSION - SIMPLE MED
INTRAVENOUS | Status: DC | PRN
  Administered 2019-02-21: 500 mL via INTRAVENOUS

## 2019-02-21 MED ORDER — LACTATED RINGERS IV SOLN
INTRAVENOUS | Status: DC
  Administered 2019-02-21 (×2): via INTRAVENOUS

## 2019-02-21 MED ORDER — BUPIVACAINE ON-Q PAIN PUMP (FOR ORDER SET NO CHG): INJECTION | Status: DC

## 2019-02-21 MED ORDER — BUPIVACAINE IN DEXTROSE 0.75-8.25 % IT SOLN
INTRATHECAL | Status: DC | PRN
  Administered 2019-02-21: 1.3 mL via INTRATHECAL

## 2019-02-21 MED ORDER — EPHEDRINE SULFATE 50 MG/ML IJ SOLN
INTRAMUSCULAR | Status: DC | PRN
  Administered 2019-02-21: 10 mg via INTRAVENOUS
  Administered 2019-02-21: 5 mg via INTRAVENOUS

## 2019-02-21 MED ORDER — KETOROLAC TROMETHAMINE 30 MG/ML IJ SOLN: 30.0000 mg | Freq: Four times a day (QID) | INTRAMUSCULAR | Status: AC | PRN

## 2019-02-21 MED ORDER — NALOXONE HCL 0.4 MG/ML IJ SOLN: 0.4000 mg | INTRAMUSCULAR | Status: DC | PRN

## 2019-02-21 MED ORDER — NALBUPHINE HCL 10 MG/ML IJ SOLN: 5.0000 mg | INTRAMUSCULAR | Status: DC | PRN

## 2019-02-21 MED ORDER — KETOROLAC TROMETHAMINE 30 MG/ML IJ SOLN
30.0000 mg | Freq: Four times a day (QID) | INTRAMUSCULAR | Status: AC | PRN
  Administered 2019-02-21: 21:00:00 30 mg via INTRAVENOUS
  Filled 2019-02-21: qty 1

## 2019-02-21 MED ORDER — TETANUS-DIPHTH-ACELL PERTUSSIS 5-2.5-18.5 LF-MCG/0.5 IM SUSP: 0.5000 mL | Freq: Once | INTRAMUSCULAR | Status: DC

## 2019-02-21 MED ORDER — DIPHENHYDRAMINE HCL 25 MG PO CAPS: 25.0000 mg | ORAL_CAPSULE | Freq: Four times a day (QID) | ORAL | Status: DC | PRN

## 2019-02-21 MED ORDER — ONDANSETRON HCL 4 MG/2ML IJ SOLN: 4.0000 mg | Freq: Once | INTRAMUSCULAR | Status: DC | PRN

## 2019-02-21 MED ORDER — COCONUT OIL OIL
1.0000 "application " | TOPICAL_OIL | Status: DC | PRN
  Filled 2019-02-21: qty 120

## 2019-02-21 MED ORDER — PRENATAL MULTIVITAMIN CH
1.0000 | ORAL_TABLET | Freq: Every day | ORAL | Status: DC
  Administered 2019-02-22 – 2019-02-23 (×2): 1 via ORAL
  Filled 2019-02-21 (×2): qty 1

## 2019-02-21 MED ORDER — ZOLPIDEM TARTRATE 5 MG PO TABS: 5.0000 mg | ORAL_TABLET | Freq: Every evening | ORAL | Status: DC | PRN

## 2019-02-21 MED ORDER — FENTANYL CITRATE (PF) 100 MCG/2ML IJ SOLN
INTRAMUSCULAR | Status: AC
  Filled 2019-02-21: qty 2

## 2019-02-21 MED ORDER — SODIUM CHLORIDE 0.9 % IV SOLN
2.0000 g | Freq: Once | INTRAVENOUS | Status: AC
  Administered 2019-02-21: 09:00:00 2 g via INTRAVENOUS
  Filled 2019-02-21: qty 2000

## 2019-02-21 MED ORDER — SODIUM CHLORIDE 0.9% FLUSH: 3.0000 mL | INTRAVENOUS | Status: DC | PRN

## 2019-02-21 MED ORDER — NALBUPHINE HCL 10 MG/ML IJ SOLN
5.0000 mg | Freq: Once | INTRAMUSCULAR | Status: AC | PRN
  Administered 2019-02-21: 21:00:00 5 mg via INTRAVENOUS

## 2019-02-21 MED ORDER — ONDANSETRON HCL 4 MG/2ML IJ SOLN: 4.0000 mg | Freq: Four times a day (QID) | INTRAMUSCULAR | Status: DC | PRN

## 2019-02-21 MED ORDER — OXYTOCIN BOLUS FROM INFUSION: 500.0000 mL | Freq: Once | INTRAVENOUS | Status: DC

## 2019-02-21 MED ORDER — BUPIVACAINE 0.25 % ON-Q PUMP DUAL CATH 400 ML
400.0000 mL | INJECTION | Status: DC
  Filled 2019-02-21: qty 400

## 2019-02-21 MED ORDER — SIMETHICONE 80 MG PO CHEW
80.0000 mg | CHEWABLE_TABLET | ORAL | Status: DC
  Administered 2019-02-22 (×2): 80 mg via ORAL
  Filled 2019-02-21: qty 1

## 2019-02-21 MED ORDER — SOD CITRATE-CITRIC ACID 500-334 MG/5ML PO SOLN
30.0000 mL | ORAL | Status: AC
  Administered 2019-02-21: 30 mL via ORAL

## 2019-02-21 MED ORDER — SODIUM CHLORIDE 0.9 % IV SOLN
INTRAVENOUS | Status: DC | PRN
  Administered 2019-02-21 (×3): 5 mL via EPIDURAL

## 2019-02-21 MED ORDER — OXYTOCIN 40 UNITS IN NORMAL SALINE INFUSION - SIMPLE MED
2.5000 [IU]/h | INTRAVENOUS | Status: DC
  Administered 2019-02-21: 2.5 [IU]/h via INTRAVENOUS
  Filled 2019-02-21: qty 1000

## 2019-02-21 SURGICAL SUPPLY — 24 items
CANISTER SUCT 3000ML PPV (MISCELLANEOUS) ×3 IMPLANT
CATH KIT ON-Q SILVERSOAK 5IN (CATHETERS) ×6 IMPLANT
CHLORAPREP W/TINT 26 (MISCELLANEOUS) ×6 IMPLANT
COVER WAND RF STERILE (DRAPES) ×3 IMPLANT
DERMABOND ADVANCED (GAUZE/BANDAGES/DRESSINGS) ×2
DERMABOND ADVANCED .7 DNX12 (GAUZE/BANDAGES/DRESSINGS) ×1 IMPLANT
ELECT CAUTERY BLADE 6.4 (BLADE) ×3 IMPLANT
ELECT REM PT RETURN 9FT ADLT (ELECTROSURGICAL) ×3
ELECTRODE REM PT RTRN 9FT ADLT (ELECTROSURGICAL) ×1 IMPLANT
GLOVE SKINSENSE NS SZ8.0 LF (GLOVE) ×2
GLOVE SKINSENSE STRL SZ8.0 LF (GLOVE) ×1 IMPLANT
GOWN STRL REUS W/ TWL LRG LVL3 (GOWN DISPOSABLE) ×1 IMPLANT
GOWN STRL REUS W/ TWL XL LVL3 (GOWN DISPOSABLE) ×2 IMPLANT
GOWN STRL REUS W/TWL LRG LVL3 (GOWN DISPOSABLE) ×2
GOWN STRL REUS W/TWL XL LVL3 (GOWN DISPOSABLE) ×4
NS IRRIG 1000ML POUR BTL (IV SOLUTION) ×3 IMPLANT
PACK C SECTION AR (MISCELLANEOUS) ×3 IMPLANT
PAD OB MATERNITY 4.3X12.25 (PERSONAL CARE ITEMS) ×3 IMPLANT
PAD PREP 24X41 OB/GYN DISP (PERSONAL CARE ITEMS) ×3 IMPLANT
PENCIL SMOKE ULTRAEVAC 22 CON (MISCELLANEOUS) ×3 IMPLANT
SUT MAXON ABS #0 GS21 30IN (SUTURE) ×6 IMPLANT
SUT VIC AB 1 CT1 36 (SUTURE) ×9 IMPLANT
SUT VIC AB 2-0 CT1 36 (SUTURE) ×3 IMPLANT
SUT VIC AB 4-0 FS2 27 (SUTURE) ×3 IMPLANT

## 2019-02-21 NOTE — H&P (Signed)
Patient has presented for scheduled induction of labor at term.   History and Physical note from 02/19/2019 has been reviewed and changes are noted below.  She is stable and prepared for IOL. Please see Progress note from 02/19/2019 for details of patient history.   Corona virus screening done on 02/19/2019 and results are negative.   Pelvic exam today: 2.5/70/-2 Cervical sweep done  Plan: GBS positive: begin ampicillin prophylaxis Begin induction with Pitocin, evaluate for cervical change Encouraged to ambulate and position changes IV/T&S/CBC/RPR Clear liquid diet  Parke Poisson, CNM [facility] 02/21/2019, 8:25 AM

## 2019-02-21 NOTE — Progress Notes (Signed)
   02/21/19 1000  Clinical Encounter Type  Visited With Patient and family together  Visit Type Initial  Referral From Nurse  Consult/Referral To Chaplain  Spiritual Encounters  Spiritual Needs Other (Comment)  Chaplain received OR for AD Chaplain visit patient and educated patient and husband. Patient will have Chaplain page when document is completed.

## 2019-02-21 NOTE — Lactation Note (Signed)
Lactation Consultation Note  Patient Name: PATRICE AVETISYAN GQQPY'[id number] Date: 02/21/2019     Maternal Data  MOB stated that she has inverted nipples. LC determined that MOB's left nipple appears to be flat, but this could also be from labor fluids. Rt. Nipple is everted. MOB states that prior baby who is now 57mos and was in the NICU never latched. After discussing with LC prior baby was dx with a tongue and lip tie, but wasn't corrected. MOB exclusively pumped breastmilk for 50mos w/o any issues.   Feeding    LATCH Score                   Interventions    Lactation Tools Discussed/Used     Consult Status   LC has spoken with nursing staff about using a 70mm nipple shield if baby has issues with latching. LC left info for Ped. Dentist with LC on for 5/9.   [place] Peter 02/21/2019, 12:42 PM

## 2019-02-21 NOTE — Discharge Instructions (Signed)

## 2019-02-21 NOTE — Transfer of Care (Signed)
Immediate Anesthesia Transfer of Care Note  Patient: Kimberly Farmer  Procedure(s) Performed: CESAREAN SECTION (N/A )  Patient Location: PACU  Anesthesia Type:Epidural  Level of Consciousness: awake, alert  and oriented  Airway & Oxygen Therapy: Patient Spontanous Breathing  Post-op Assessment: Post -op Vital signs reviewed and stable  Post vital signs: stable  Last Vitals:  Vitals Value Taken Time  BP 102/41 02/21/2019  6:49 PM  Temp    Pulse 87 02/21/2019  6:49 PM  Resp 12 02/21/2019  6:49 PM  SpO2 94 % 02/21/2019  6:49 PM    Last Pain:  Vitals:   02/21/19 1521  TempSrc: Oral  PainSc:          Complications: No apparent anesthesia complications

## 2019-02-21 NOTE — Progress Notes (Signed)
  Labor Progress Note   23 y.o. [id number] @ [redacted]w[redacted]d , admitted for  Pregnancy, Labor Management.   Subjective:  Min pain as has epidural Positioning and O2 due to decels, worse in certain positions Has tried pushing to reduce rim of cervix, without success and not well tolerated with FHTs  Objective:  BP 113/61   Pulse 78   Temp 97.9 F (36.6 C) (Oral)   Resp 19   Ht 5\' 6"  (1.676 m)   Wt 124.7 kg   LMP 05/12/2018   SpO2 100%   BMI 44.39 kg/m  Abd: gravid, ND, FHT present, mild tenderness on exam Extr: trace to 1+ bilateral pedal edema SVE: CERVIX: Ant Lip cm dilated  EFM: FHR: 140 bpm, variability: moderate,  accelerations:  Present,  decelerations:  Present var and occas prolonged Toco: Frequency: Every 2-3 minutes Labs: I have reviewed the patient's lab results.   Assessment & Plan:  G3P1011 @ [redacted]w[redacted]d, admitted for  Pregnancy and Labor/Delivery Management  1. Pain management: epidural. 2. FWB: FHT category 2.  3. ID: GBS positive 4. Labor management: Plan CS as she has had several hours at 9+cm without change or descent and concern for fetal well being with ongoing deceleration in certain positions  The risks of cesarean section discussed with the patient included but were not limited to: bleeding which may require transfusion or reoperation; infection which may require antibiotics; injury to bowel, bladder, ureters or other surrounding organs; injury to the fetus; need for additional procedures including hysterectomy in the event of a life-threatening hemorrhage; placental abnormalities wth subsequent pregnancies, incisional problems, thromboembolic phenomenon and other postoperative/anesthesia complications. The patient concurred with the proposed plan, giving informed written consent for the procedure.   All discussed with patient, see orders  Annamarie Major, MD, [facility], [facility] 02/21/2019  5:04 PM

## 2019-02-21 NOTE — Progress Notes (Addendum)
  Labor Progress Note   23 y.o. [id number] @ [redacted]w[redacted]d , admitted for  Pregnancy, Labor Management. IOL  Subjective:  Feeling contractions and coping well. Patient denies headache, visual changes or epigastric pain.  Objective:  BP (!) 145/80   Pulse 89   Temp 98.4 F (36.9 C) (Oral)   Resp 16   Ht 5\' 6"  (1.676 m)   Wt 124.7 kg   LMP 05/12/2018   BMI 44.39 kg/m  Abd: gravid, ND, FHT present, mild tenderness on exam Extr: trace to 1+ bilateral pedal edema SVE: CERVIX: 6 cm dilated, 80 effaced, -2 station AROM: large amount, light meconium  EFM: FHR: 135 bpm, variability: moderate,  accelerations:  Present,  decelerations:  Absent Toco: Frequency: Every 3-4 minutes Labs: I have reviewed the patient's lab results.   Assessment & Plan:  G3P1011 @ [redacted]w[redacted]d, admitted for  Pregnancy and Labor/Delivery Management  1. Pain management: none. 2. FWB: FHT category I.  3. ID: GBS positive: s/p 2nd dose ampicillin 4. Labor management: pitocin, s/p AROM 5. PIH labs given history of preeclampsia and elevated BP (sitting on birth ball at time of reading and talking) will recheck BP  All discussed with patient, see orders  Tresea Mall, CNM [facility] 02/21/2019  12:33 PM

## 2019-02-21 NOTE — Op Note (Signed)
Cesarean Section Procedure Note Indications: failure to progress: arrest of descent and term intrauterine pregnancy  Pre-operative Diagnosis: Intrauterine pregnancy [redacted]w[redacted]d ;  failure to progress: arrest of descent and term intrauterine pregnancy Post-operative Diagnosis: same, delivered. Procedure: Low Transverse Cesarean Section Surgeon: Annamarie Major, MD, FACOG Anesthesia: Spinal anesthesia Estimated Blood Loss:800 mL Complications: None; patient tolerated the procedure well. Disposition: PACU - hemodynamically stable. Condition: stable  Findings: A female infant in the cephalic presentation.   "Pamelia Hoit" Amniotic fluid - Meconium  Birth weight 9-8lbs.  Apgars of 7 and 9.  Intact placenta with a three-vessel cord. Grossly normal uterus, tubes and ovaries bilaterally. No intraabdominal adhesions were noted.  Procedure Details   The patient was taken to Operating Room, identified as the correct patient and the procedure verified as C-Section Delivery. A Time Out was held and the above information confirmed. After induction of anesthesia, the patient was draped and prepped in the usual sterile manner. A Pfannenstiel incision was made and carried down through the subcutaneous tissue to the fascia. Fascial incision was made and extended transversely with the Mayo scissors. The fascia was separated from the underlying rectus tissue superiorly and inferiorly. The peritoneum was identified and entered bluntly. Peritoneal incision was extended longitudinally. The utero-vesical peritoneal reflection was incised transversely and a bladder flap was created digitally.  A low transverse hysterotomy was made. The fetus was delivered atraumatically. The umbilical cord was clamped x2 and cut and the infant was handed to the awaiting pediatricians. The placenta was removed intact and appeared normal with a 3-vessel cord.  The uterus was exteriorized and cleared of all clot and debris. The hysterotomy was closed with  running sutures of 0 Vicryl suture. A second imbricating layer was placed with the same suture. Excellent hemostasis was observed. The uterus was returned to the abdomen. The pelvis was irrigated and again, excellent hemostasis was noted.  The On Q Pain pump System was then placed.  Trocars were placed through the abdominal wall into the subfascial space and these were used to thread the silver soaker cathaters into place.The rectus fascia was then reapproximated with running sutures of Maxon, with careful placement not to incorporate the cathaters. Subcutaneous tissues are then irrigated with saline and hemostasis assured.  Skin is then closed with 4-0 vicryl suture in a subcuticular fashion followed by skin adhesive. The cathaters are flushed each with 5 mL of Bupivicaine and stabilized into place with dressing. Instrument, sponge, and needle counts were correct prior to the abdominal closure and at the conclusion of the case.  The patient tolerated the procedure well and was transferred to the recovery room in stable condition.   Annamarie Major, MD, [facility], [facility] 02/21/2019  6:48 PM

## 2019-02-21 NOTE — Discharge Summary (Signed)
OB Discharge Summary     Patient Name: Kimberly MainSamantha K Pung DOB: 10/07/1996 MRN: [id number]  Date of admission: 02/21/2019 Delivering MD: Letitia Libraobert Paul Talin Feister, MD  Date of Delivery: 02/21/2019  Date of discharge: 02/24/2019  Admitting diagnosis: L and D for Post dates induction Intrauterine pregnancy: 6753w5d     Secondary diagnosis: None     Discharge diagnosis: Term Pregnancy Delivered, Reasons for cesarean section  Arrest of Descent                         Hospital course:  Induction of Labor With Cesarean Section  23 y.o. yo G3P1011 at 1053w5d was admitted to the hospital 02/21/2019 for induction of labor. Patient had a labor course significant for AROM and Pitocin to augement labor.. The patient went for cesarean section due to Arrest of Descent, and delivered a Viable infant,02/21/2019  Membrane Rupture Time/Date: 12:27 PM ,02/21/2019   Details of operation can be found in separate operative Note.  Patient had an uncomplicated postpartum course. She is ambulating, tolerating a regular diet, passing flatus, and urinating well.  Patient is discharged home in stable condition on 02/24/19.                                                                                                   Post partum procedures:none  Complications: None  Physical exam on 02/24/2019: Vitals:   02/22/19 1500 02/22/19 1700 02/22/19 2026 02/23/19 0821  BP:   114/80 96/68  Pulse: 96 (!) 104 88 84  Resp:   18 18  Temp:   98.1 F (36.7 C) 98 F (36.7 C)  TempSrc:   Oral Oral  SpO2: 99% 99% 100% 100%  Weight:      Height:       General: alert Lochia: appropriate Uterine Fundus: firm Incision: Dressing is clean, dry, and intact DVT Evaluation: No evidence of DVT seen on physical exam.  Labs: Lab Results  Component Value Date   WBC 14.7 (H) 02/22/2019   HGB 8.5 (L) 02/22/2019   HCT 27.7 (L) 02/22/2019   MCV 78.7 (L) 02/22/2019   PLT 263 02/22/2019   CMP Latest Ref Rng & Units 02/21/2019  Glucose 70 - 99 mg/dL  409(W139(H)  BUN 6 - 20 mg/dL 7  Creatinine 1.190.44 - 1.471.00 mg/dL 8.290.56  Sodium 562135 - 130145 mmol/L 136  Potassium 3.5 - 5.1 mmol/L 3.4(L)  Chloride 98 - 111 mmol/L 108  CO2 22 - 32 mmol/L 18(L)  Calcium 8.9 - 10.3 mg/dL 8.6(V8.6(L)  Total Protein 6.5 - 8.1 g/dL 6.8  Total Bilirubin 0.3 - 1.2 mg/dL 0.3  Alkaline Phos 38 - 126 U/L 153(H)  AST 15 - 41 U/L 19  ALT 0 - 44 U/L 9    Discharge instruction: per After Visit Summary.  Medications:  Allergies as of 02/23/2019      Reactions   Hydrocodone Other (See Comments)   Other Reaction: SEVERE HA & GI Upset   Oxycodone Other (See Comments)   Other Reaction: SEVERE HA & GI Upset   Tape Rash  Medication List    STOP taking these medications   aspirin EC 81 MG tablet     TAKE these medications   folic acid 1 MG tablet Commonly known as:  FOLVITE Take 1 tablet (1 mg total) by mouth daily.   ibuprofen 800 MG tablet Commonly known as:  ADVIL Take 1 tablet (800 mg total) by mouth every 6 (six) hours.   norethindrone 0.35 MG tablet Commonly known as:  MICRONOR Take 1 tablet (0.35 mg total) by mouth daily.   ONE A DAY PRENATAL PO Take by mouth.   oxyCODONE-acetaminophen 5-325 MG tablet Commonly known as:  PERCOCET/ROXICET Take 1-2 tablets by mouth every 4 (four) hours as needed for moderate pain.            Discharge Care Instructions  (From admission, onward)         Start     Ordered   02/23/19 0000  Discharge wound care:    Comments:  You may apply a light dressing for minor discharge from the incision or to keep waistbands of clothing from rubbing.  You may also have been discharge with a clear dressing in which case this will be removed at your postoperative clinic visit.  You may shower, use soap on your incision.  Avoid baths or soaking the incision in the first 6 weeks following your surgery.Marland Kitchen   02/23/19 0748          Diet: routine diet  Activity: Advance as tolerated. Pelvic rest for 6 weeks.   Outpatient  follow up: Follow-up Information    Nadara Mustard, MD. Schedule an appointment as soon as possible for a visit in 2 week(s).   Specialty:  Obstetrics and Gynecology Why:  Post Op Contact information: [address] [place] [address] [phone]             Postpartum contraception: Progesterone only pills Rhogam Given postpartum: no Rubella vaccine given postpartum: yes Varicella vaccine given postpartum: no TDaP given antepartum or postpartum: Yes  Newborn Data: Live born female  Birth Weight: 9 lb 8.4 oz (4320 g) APGAR: 7, 9  Newborn Delivery   Birth date/time:  02/21/2019 18:01:00 Delivery type:  C-Section, Low Transverse Trial of labor:  Yes C-section categorization:  Primary      Baby Feeding: Breast  Disposition:home with mother  SIGNED: Letitia Libra, MD 02/24/2019 7:51 AM

## 2019-02-21 NOTE — Anesthesia Procedure Notes (Signed)
Spinal  Start time: 02/21/2019 5:47 PM End time: 02/21/2019 5:50 PM Staffing Anesthesiologist: Lenard Simmer, MD Resident/CRNA: Irving Burton, CRNA Performed: anesthesiologist  Preanesthetic Checklist Completed: patient identified, site marked, surgical consent, pre-op evaluation, IV checked, risks and benefits discussed and monitors and equipment checked Spinal Block Patient position: sitting Prep: ChloraPrep and site prepped and draped Patient monitoring: heart rate, cardiac monitor and blood pressure Approach: midline Location: L3-4 Injection technique: single-shot Needle Needle type: Pencan  Needle gauge: 25 G

## 2019-02-21 NOTE — Anesthesia Preprocedure Evaluation (Addendum)
Anesthesia Evaluation  Patient identified by MRN, date of birth, ID band Patient awake    Reviewed: Allergy & Precautions, H&P , NPO status , Patient's Chart, lab work & pertinent test results  Airway Mallampati: I  TM Distance: >3 FB Neck ROM: full    Dental  (+) Teeth Intact   Pulmonary asthma ("airway hyperreactivity" in chart although pt denies asthma, does not use inhalers) ,           Cardiovascular Exercise Tolerance: Good (-) hypertensionnegative cardio ROS       Neuro/Psych negative neurological ROS     GI/Hepatic negative GI ROS, Neg liver ROS,   Endo/Other  Morbid obesity  Renal/GU negative Renal ROS  negative genitourinary   Musculoskeletal   Abdominal   Peds  Hematology negative hematology ROS (+)   Anesthesia Other Findings Past Medical History: No date: Arthritis     Comment:  Bilateral knees and back No date: Lumbar disc disease No date: PCOS (polycystic ovarian syndrome)  Past Surgical History: 2011: KNEE SURGERY; Left     Comment:  [name] has had 5 surgeries on her knee 2007: TONSILLECTOMY AND ADENOIDECTOMY  BMI    Body Mass Index:  44.39 kg/m    Epidural is currently unilateral, so will plan to pull it and perform a spinal with decreased dose of local.  Patient informed of risks/benefits of this versus trying to dose the epidural and is in agreement with the plan.  Reproductive/Obstetrics (+) Pregnancy                           Anesthesia Physical Anesthesia Plan  ASA: III  Anesthesia Plan: Spinal   Post-op Pain Management:    Induction:   PONV Risk Score and Plan:   Airway Management Planned: Nasal Cannula  Additional Equipment:   Intra-op Plan:   Post-operative Plan:   Informed Consent: I have reviewed the patients History and Physical, chart, labs and discussed the procedure including the risks, benefits and alternatives for the proposed  anesthesia with the patient or authorized representative who has indicated his/her understanding and acceptance.       Plan Discussed with: Anesthesiologist  Anesthesia Plan Comments:       Anesthesia Quick Evaluation

## 2019-02-21 NOTE — Anesthesia Procedure Notes (Signed)
Epidural Patient location during procedure: OB Start time: 02/21/2019 1:19 PM End time: 02/21/2019 1:39 PM  Staffing Anesthesiologist: Jovita Gamma, MD Performed: anesthesiologist   Preanesthetic Checklist Completed: patient identified, site marked, surgical consent, pre-op evaluation, timeout performed, IV checked, risks and benefits discussed and monitors and equipment checked  Epidural Patient position: sitting Prep: ChloraPrep Patient monitoring: heart rate, continuous pulse ox and blood pressure Approach: midline Location: L4-L5 Injection technique: LOR saline  Needle:  Needle type: Tuohy  Needle gauge: 18 G Needle length: 9 cm and 9 Needle insertion depth: 8 cm Catheter type: closed end flexible Catheter size: 20 Guage Catheter at skin depth: 13 cm Test dose: negative and Other  Assessment Events: blood not aspirated, injection not painful, no injection resistance, negative IV test and no paresthesia  Additional Notes   Patient tolerated the insertion well without complications.Reason for block:procedure for pain

## 2019-02-21 NOTE — Anesthesia Post-op Follow-up Note (Signed)
Anesthesia QCDR form completed.        

## 2019-02-22 LAB — CBC
HCT: 27.7 % — ABNORMAL LOW (ref 36.0–46.0)
Hemoglobin: 8.5 g/dL — ABNORMAL LOW (ref 12.0–15.0)
MCH: 24.1 pg — ABNORMAL LOW (ref 26.0–34.0)
MCHC: 30.7 g/dL (ref 30.0–36.0)
MCV: 78.7 fL — ABNORMAL LOW (ref 80.0–100.0)
Platelets: 263 10*3/uL (ref 150–400)
RBC: 3.52 MIL/uL — ABNORMAL LOW (ref 3.87–5.11)
RDW: 14.3 % (ref 11.5–15.5)
WBC: 14.7 10*3/uL — ABNORMAL HIGH (ref 4.0–10.5)
nRBC: 0 % (ref 0.0–0.2)

## 2019-02-22 LAB — RPR: RPR Ser Ql: NONREACTIVE

## 2019-02-22 MED ORDER — IBUPROFEN 800 MG PO TABS
800.0000 mg | ORAL_TABLET | Freq: Four times a day (QID) | ORAL | Status: DC
  Administered 2019-02-22 – 2019-02-23 (×4): 800 mg via ORAL
  Filled 2019-02-22 (×4): qty 1

## 2019-02-22 NOTE — Lactation Note (Signed)
This note was copied from a baby's chart. Lactation Consultation Note  Patient Name: Kimberly Farmer Date: 02/22/2019 Reason for consult: Follow-up assessment Cluster feeding behavior, recommended skin to skin, reassured mom that this is normal newborn  behavior  Maternal Data Formula Feeding for Exclusion: No Does the patient have breastfeeding experience prior to this delivery?: Yes  Feeding Feeding Type: Breast Fed  Baby falls asleep at breast but wants to suck when not at breast  LATCH Score Latch: Grasps breast easily, tongue down, lips flanged, rhythmical sucking.  Audible Swallowing: Spontaneous and intermittent  Type of Nipple: Everted at rest and after stimulation  Comfort (Breast/Nipple): Soft / non-tender  Hold (Positioning): Assistance needed to correctly position infant at breast and maintain latch.  LATCH Score: 9  Interventions Interventions: Assisted with latch;Skin to skin;Breast compression;Adjust position;Support pillows;Position options  Lactation Tools Discussed/Used WIC Program: Yes   Consult Status Consult Status: PRN    Dyann Kief 02/22/2019, 12:58 PM

## 2019-02-22 NOTE — Progress Notes (Signed)
Patient ambulated twice around the hall already. No issues only wanting tordol so far for pain.

## 2019-02-22 NOTE — Lactation Note (Signed)
This note was copied from a baby's chart. Lactation Consultation Note  Patient Name: Boy Jaliyiah Cervantes [id number] Date: 02/22/2019 Reason for consult: Initial assessment Baby had recently fed on left breast, showing feeding cues, switched to right breast with assistance, latched easily with support of breast to get deep latch and lower jaw down, mom stated that she pumped 2 oz of colostrum the day before she was admitted for IOL. She has an Naval architect pump and style for use at home     Maternal Data Formula Feeding for Exclusion: No Does the patient have breastfeeding experience prior to this delivery?: Yes  Feeding Feeding Type: Breast Fed Nursing well with many swallows  LATCH Score Latch: Grasps breast easily, tongue down, lips flanged, rhythmical sucking.  Audible Swallowing: Spontaneous and intermittent  Type of Nipple: Everted at rest and after stimulation  Comfort (Breast/Nipple): Soft / non-tender  Hold (Positioning): Assistance needed to correctly position infant at breast and maintain latch.  LATCH Score: 9  Interventions Interventions: Breast feeding basics reviewed;Assisted with latch;Skin to skin;Breast compression;Adjust position;Support pillows  Lactation Tools Discussed/Used WIC Program: Yes   Consult Status Consult Status: PRN    Dyann Kief 02/22/2019, 11:01 AM

## 2019-02-22 NOTE — Progress Notes (Signed)
Subjective:  Doing well.  Tolerating po.  Minimal lochia.  Good pain control on po analgesics.  No fevers or chills  Objective:  Vital signs in last 24 hours: Temp:  [97.3 F (36.3 C)-98.7 F (37.1 C)] 98 F (36.7 C) (05/09 0706) Pulse Rate:  [77-108] 88 (05/09 0706) Resp:  [12-25] 17 (05/09 0706) BP: (93-145)/(41-80) 109/61 (05/09 0706) SpO2:  [94 %-100 %] 100 % (05/09 0706)    Intake/Output      05/08 0701 - 05/09 0700 05/09 0701 - 05/10 0700   I.V. (mL/kg) 599.1 (4.8)    IV Piggyback 100    Total Intake(mL/kg) 699.1 (5.6)    Urine (mL/kg/hr) 130    Blood 905    Total Output 1035    Net -335.9         Urine Occurrence 900 x      General: NAD Pulmonary: no increased work of breathing Abdomen: non-distended, non-tender, fundus firm at level of umbilicus Incision: D/C/I, some leaking at ON-Q site Extremities: no edema, no erythema, no tenderness  Results for orders placed or performed during the hospital encounter of 02/21/19 (from the past 72 hour(s))  CBC     Status: Abnormal   Collection Time: 02/21/19  8:22 AM  Result Value Ref Range   WBC 10.2 4.0 - 10.5 K/uL   RBC 4.61 3.87 - 5.11 MIL/uL   Hemoglobin 11.1 (L) 12.0 - 15.0 g/dL   HCT 35.1 (L) 36.0 - 46.0 %   MCV 76.1 (L) 80.0 - 100.0 fL   MCH 24.1 (L) 26.0 - 34.0 pg   MCHC 31.6 30.0 - 36.0 g/dL   RDW 14.2 11.5 - 15.5 %   Platelets 334 150 - 400 K/uL   nRBC 0.0 0.0 - 0.2 %    Comment: Performed at [facility], [address] [place], [place] [address]  RPR     Status: None   Collection Time: 02/21/19  8:22 AM  Result Value Ref Range   RPR Ser Ql Non Reactive Non Reactive    Comment: (NOTE) Performed At: [facility] [place], [place] [id number] Rush Farmer MD TW:[id number]   Type and screen     Status: None   Collection Time: 02/21/19  8:22 AM  Result Value Ref Range   ABO/RH(D) O POS    Antibody Screen NEG    Sample Expiration      02/24/2019,2359 Performed  at [facility], [address] [place], [place] [address]   Comprehensive metabolic panel     Status: Abnormal   Collection Time: 02/21/19  8:22 AM  Result Value Ref Range   Sodium 136 135 - 145 mmol/L   Potassium 3.4 (L) 3.5 - 5.1 mmol/L   Chloride 108 98 - 111 mmol/L   CO2 18 (L) 22 - 32 mmol/L   Glucose, Bld 139 (H) 70 - 99 mg/dL   BUN 7 6 - 20 mg/dL   Creatinine, Ser 0.56 0.44 - 1.00 mg/dL   Calcium 8.6 (L) 8.9 - 10.3 mg/dL   Total Protein 6.8 6.5 - 8.1 g/dL   Albumin 2.8 (L) 3.5 - 5.0 g/dL   AST 19 15 - 41 U/L   ALT 9 0 - 44 U/L   Alkaline Phosphatase 153 (H) 38 - 126 U/L   Total Bilirubin 0.3 0.3 - 1.2 mg/dL   GFR calc non Af Amer >60 >60 mL/min   GFR calc Af Amer >60 >60 mL/min   Anion gap  10 5 - 15    Comment: Performed at [facility], [address] [place], [place] [address]  Protein / creatinine ratio, urine     Status: None   Collection Time: 02/21/19  3:37 PM  Result Value Ref Range   Creatinine, Urine 146 mg/dL   Total Protein, Urine 13 mg/dL    Comment: NO NORMAL RANGE ESTABLISHED FOR THIS TEST   Protein Creatinine Ratio 0.09 0.00 - 0.15 mg/mg[Cre]    Comment: Performed at [facility], [address] [place], [place] [address]  CBC     Status: Abnormal   Collection Time: 02/22/19  5:44 AM  Result Value Ref Range   WBC 14.7 (H) 4.0 - 10.5 K/uL   RBC 3.52 (L) 3.87 - 5.11 MIL/uL   Hemoglobin 8.5 (L) 12.0 - 15.0 g/dL   HCT 27.7 (L) 36.0 - 46.0 %   MCV 78.7 (L) 80.0 - 100.0 fL   MCH 24.1 (L) 26.0 - 34.0 pg   MCHC 30.7 30.0 - 36.0 g/dL   RDW 14.3 11.5 - 15.5 %   Platelets 263 150 - 400 K/uL   nRBC 0.0 0.0 - 0.2 %    Comment: Performed at [facility], [address], [place], [place] [address]    Immunization History  Administered Date(s) Administered  . HPV Quadrivalent 04/08/2014  . Hepatitis A 04/08/2014  . Hepatitis A, Adult 04/08/2014  . Influenza,inj,Quad PF,6+ Mos 08/01/2018  . Meningococcal Conjugate  04/08/2014  . Tdap 02/26/2017, 12/12/2018    Assessment:   23 y.o. [id number] postoperativeday # 1 1LTCS for FTP   Plan:  ) Acute blood loss anemia - hemodynamically stable and asymptomatic - po ferrous sulfate  2) Blood Type --/--/O POS (05/08 1657) / Rubella <0.90 (09/06 1006) / Varicella Immune - MMR at discharge  3) TDAP status up to date  4) Feeding plan breast  5)  Education given regarding options for contraception, as well as compatibility with breast feeding if applicable.   6) Disposition - atnticipate discharge [place], MD, [place], [facility] 02/22/2019, 8:58 AM

## 2019-02-22 NOTE — Anesthesia Postprocedure Evaluation (Signed)
Anesthesia Post Note  Patient: Kimberly Farmer  Procedure(s) Performed: CESAREAN SECTION (N/A )  Patient location during evaluation: Mother Baby Anesthesia Type: Epidural Level of consciousness: awake and alert and oriented Pain management: pain level controlled Vital Signs Assessment: post-procedure vital signs reviewed and stable Respiratory status: spontaneous breathing Cardiovascular status: blood pressure returned to baseline Postop Assessment: no headache and no backache Anesthetic complications: no     Last Vitals:  Vitals:   02/22/19 0700 02/22/19 0706  BP:  109/61  Pulse:  88  Resp:  17  Temp:  36.7 C  SpO2: 97% 100%    Last Pain:  Vitals:   02/22/19 0833  TempSrc:   PainSc: 0-No pain                 Kynsie Falkner

## 2019-02-22 NOTE — Anesthesia Post-op Follow-up Note (Signed)
  Anesthesia Pain Follow-up Note  Patient: Kimberly Farmer  Day #: 1  Date of Follow-up: 02/22/2019 Time: 8:53 AM  Last Vitals:  Vitals:   02/22/19 0700 02/22/19 0706  BP:  109/61  Pulse:  88  Resp:  17  Temp:  36.7 C  SpO2: 97% 100%    Level of Consciousness: alert  Pain: none   Side Effects:None  Catheter Site Exam:clean, dry, no drainage     Plan: D/C from anesthesia care at surgeon's request  Abrahm Mancia

## 2019-02-23 MED ORDER — OXYCODONE-ACETAMINOPHEN 5-325 MG PO TABS: 1.0000 | ORAL_TABLET | ORAL | 0 refills | Status: DC | PRN

## 2019-02-23 MED ORDER — NORETHINDRONE 0.35 MG PO TABS: 1.0000 | ORAL_TABLET | Freq: Every day | ORAL | 11 refills | Status: DC

## 2019-02-23 MED ORDER — IBUPROFEN 800 MG PO TABS: 800.0000 mg | ORAL_TABLET | Freq: Four times a day (QID) | ORAL | 0 refills | Status: DC

## 2019-02-23 NOTE — Progress Notes (Signed)
Discharge instructions given. Patient verbalizes understanding of teaching. Patient discharged home via wheelchair at 1510.

## 2019-02-23 NOTE — Lactation Note (Signed)
This note was copied from a baby's chart. Lactation Consultation Note  Patient Name: Kimberly Farmer [id number] Date: 02/23/2019 Reason for consult: Follow-up assessment;Mother's request;Term;Other (Comment)(Clusterfeeding - Sl receding chin, but not a problem)  Observed mom breast feed.  Can easily hand express lots of colosstrum.  Mom was concerned that [name] has been cluster feeding.  Reassured mom that this was normal.  [name] without assistance and begins strong rhythmic sucking with swallowing.  Mom attempted breast feeding with first without success and ended up pumping for 6 months.  First baby started out in NICU because mom had extreme preeclampsia.  He also had tongue and lip tie per mom.  Did not see tongue or lip tie with this baby, but does have slight receding chin.  Mom had lots of lactation questions before discharge which were addressed.  Reviewed supply and demand, normal course of lactation and routine newborn feeding patterns.  Community lactation resources and contact numbers discussed. Lactation name and number written on white board and encouraged to call with any questions, concerns or assistance. Maternal Data Formula Feeding for Exclusion: No Has patient been taught Hand Expression?: Yes Does the patient have breastfeeding experience prior to this delivery?: Yes  Feeding Feeding Type: Breast Fed  LATCH Score Latch: Grasps breast easily, tongue down, lips flanged, rhythmical sucking.  Audible Swallowing: A few with stimulation  Type of Nipple: Everted at rest and after stimulation  Comfort (Breast/Nipple): Filling, red/small blisters or bruises, mild/mod discomfort  Hold (Positioning): No assistance needed to correctly position infant at breast.  LATCH Score: 8  Interventions Interventions: Breast feeding basics reviewed;Breast massage;Breast compression;Adjust position;Support pillows;Position options;Coconut oil  Lactation Tools Discussed/Used Tools:  Coconut oil WIC Program: Yes   Consult Status Consult Status: PRN    Louis Meckel 02/23/2019, 1:56 PM

## 2019-02-24 ENCOUNTER — Encounter: Payer: Self-pay | Admitting: Obstetrics & Gynecology

## 2019-03-13 ENCOUNTER — Ambulatory Visit: Payer: 59 | Admitting: Obstetrics & Gynecology

## 2019-03-18 ENCOUNTER — Ambulatory Visit (INDEPENDENT_AMBULATORY_CARE_PROVIDER_SITE_OTHER): Payer: 59 | Admitting: Obstetrics and Gynecology

## 2019-03-18 ENCOUNTER — Observation Stay
Admission: AD | Admit: 2019-03-18 | Discharge: 2019-03-18 | Disposition: A | Discharge status: Home / Self Care | Payer: Medicaid Other | Source: Ambulatory Visit | Attending: Obstetrics and Gynecology | Admitting: Obstetrics and Gynecology

## 2019-03-18 ENCOUNTER — Other Ambulatory Visit: Payer: Self-pay

## 2019-03-18 ENCOUNTER — Encounter: Payer: Self-pay | Admitting: Obstetrics and Gynecology

## 2019-03-18 VITALS — BP 140/90 | Ht 66.0 in | Wt 252.0 lb

## 2019-03-18 DIAGNOSIS — O8600 Infection of obstetric surgical wound, unspecified: Secondary | ICD-10-CM

## 2019-03-18 DIAGNOSIS — B372 Candidiasis of skin and nail: Secondary | ICD-10-CM | POA: Diagnosis not present

## 2019-03-18 DIAGNOSIS — Z888 Allergy status to other drugs, medicaments and biological substances status: Secondary | ICD-10-CM | POA: Diagnosis not present

## 2019-03-18 DIAGNOSIS — Z1159 Encounter for screening for other viral diseases: Secondary | ICD-10-CM | POA: Diagnosis not present

## 2019-03-18 DIAGNOSIS — Z885 Allergy status to narcotic agent status: Secondary | ICD-10-CM | POA: Insufficient documentation

## 2019-03-18 HISTORY — DX: Infection of obstetric surgical wound, unspecified: O86.00

## 2019-03-18 LAB — BASIC METABOLIC PANEL
Anion gap: 8 (ref 5–15)
BUN: 8 mg/dL (ref 6–20)
CO2: 22 mmol/L (ref 22–32)
Calcium: 8.8 mg/dL — ABNORMAL LOW (ref 8.9–10.3)
Chloride: 107 mmol/L (ref 98–111)
Creatinine, Ser: 0.6 mg/dL (ref 0.44–1.00)
GFR calc Af Amer: >60 mL/min (ref >60)
GFR calc non Af Amer: >60 mL/min (ref >60)
Glucose, Bld: 91 mg/dL (ref 70–99)
Potassium: 4.1 mmol/L (ref 3.5–5.1)
Sodium: 137 mmol/L (ref 135–145)

## 2019-03-18 LAB — SARS CORONAVIRUS 2 BY RT PCR (HOSPITAL ORDER, PERFORMED IN ~~LOC~~ HOSPITAL LAB): SARS Coronavirus 2: NEGATIVE

## 2019-03-18 MED ORDER — FLUCONAZOLE 50 MG PO TABS
150.0000 mg | ORAL_TABLET | Freq: Once | ORAL | Status: AC
  Administered 2019-03-18: 150 mg via ORAL
  Filled 2019-03-18 (×2): qty 1

## 2019-03-18 MED ORDER — ONDANSETRON HCL 4 MG/2ML IJ SOLN: 4.0000 mg | Freq: Four times a day (QID) | INTRAMUSCULAR | Status: DC | PRN

## 2019-03-18 MED ORDER — CHLORHEXIDINE GLUCONATE 4 % EX LIQD
Freq: Two times a day (BID) | CUTANEOUS | Status: DC
  Administered 2019-03-18: 20:00:00 via TOPICAL

## 2019-03-18 MED ORDER — CLOTRIMAZOLE-BETAMETHASONE 1-0.05 % EX CREA: TOPICAL_CREAM | CUTANEOUS | 1 refills | Status: DC

## 2019-03-18 MED ORDER — CEPHALEXIN 500 MG PO CAPS: 500.0000 mg | ORAL_CAPSULE | Freq: Four times a day (QID) | ORAL | 2 refills | Status: DC

## 2019-03-18 MED ORDER — ACETAMINOPHEN 650 MG RE SUPP: 650.0000 mg | Freq: Four times a day (QID) | RECTAL | Status: DC | PRN

## 2019-03-18 MED ORDER — SODIUM CHLORIDE 0.9 % IV SOLN
3.0000 g | Freq: Four times a day (QID) | INTRAVENOUS | Status: DC
  Filled 2019-03-18 (×5): qty 3

## 2019-03-18 MED ORDER — BREAST MILK/FORMULA (FOR LABEL PRINTING ONLY): ORAL | Status: DC

## 2019-03-18 MED ORDER — VANCOMYCIN HCL 10 G IV SOLR
2000.0000 mg | Freq: Once | INTRAVENOUS | Status: DC
  Filled 2019-03-18: qty 2000

## 2019-03-18 MED ORDER — ONDANSETRON HCL 4 MG PO TABS: 4.0000 mg | ORAL_TABLET | Freq: Four times a day (QID) | ORAL | Status: DC | PRN

## 2019-03-18 MED ORDER — LACTATED RINGERS IV SOLN: INTRAVENOUS | Status: DC

## 2019-03-18 MED ORDER — ADULT MULTIVITAMIN W/MINERALS CH
1.0000 | ORAL_TABLET | Freq: Every day | ORAL | Status: DC
  Administered 2019-03-18: 1 via ORAL
  Filled 2019-03-18: qty 1

## 2019-03-18 MED ORDER — ACETAMINOPHEN 325 MG PO TABS: 650.0000 mg | ORAL_TABLET | Freq: Four times a day (QID) | ORAL | Status: DC | PRN

## 2019-03-18 MED ORDER — VANCOMYCIN HCL 10 G IV SOLR
1250.0000 mg | Freq: Two times a day (BID) | INTRAVENOUS | Status: DC
  Filled 2019-03-18: qty 1250

## 2019-03-18 NOTE — Discharge Summary (Signed)
MD in to assess pt- no need for admission at this time. meds called in to pharmacy. IV dc'd. Had just been started by IV team. No time for IV fluids or antibiotics due to discharge. IV removed. Dc reviewed with pt. dc'd via wc to home by CNA.

## 2019-03-18 NOTE — Progress Notes (Signed)
Patient ID: Kimberly Farmer, female   DOB: 1996-07-16, 23 y.o.   MRN: [id number]  Reason for Consult: Postpartum Care (Last night in the shower it started to sting, it was red, leaking clear fluid, today entire incision is red and the other side is leaking fluid as well )   Referred by Margaretann Loveless, P*  Subjective:     HPI:  Kimberly Farmer is a 23 y.o. female reports that last night she noticed that her incision was painful and draining clear and bloody fluid. She has not had fevers. She is otherwise feeling well.   Past Medical History:  Diagnosis Date  . Arthritis    Bilateral knees and back  . Lumbar disc disease   . PCOS (polycystic ovarian syndrome)    Family History  Problem Relation Age of Onset  . Hypertension Mother   . Alcohol abuse Father   . Diabetes Maternal Grandmother   . Hypertension Maternal Grandmother   . Cancer Paternal Grandfather        skin   Past Surgical History:  Procedure Laterality Date  . CESAREAN SECTION N/A 02/21/2019   Procedure: CESAREAN SECTION;  Surgeon: Nadara Mustard, MD;  Location: [facility] ORS;  Service: Obstetrics;  Laterality: N/A;  . KNEE SURGERY Left 2011   [name] has had 5 surgeries on her knee  . TONSILLECTOMY AND ADENOIDECTOMY  2007    Short Social History:  Social History   Tobacco Use  . Smoking status: Never Smoker  . Smokeless tobacco: Never Used  . Tobacco comment: Vape - Reduced Nicotine  Substance Use Topics  . Alcohol use: No    Allergies  Allergen Reactions  . Hydrocodone Other (See Comments)    Other Reaction: SEVERE HA & GI Upset  . Oxycodone Other (See Comments)    Other Reaction: SEVERE HA & GI Upset  . Tape Rash    Current Outpatient Medications  Medication Sig Dispense Refill  . ibuprofen (ADVIL) 800 MG tablet Take 1 tablet (800 mg total) by mouth every 6 (six) hours. 30 tablet 0  . Prenatal MV & Min w/FA-DHA (ONE A DAY PRENATAL PO) Take by mouth.    . folic acid (FOLVITE) 1 MG tablet Take  1 tablet (1 mg total) by mouth daily. (Patient not taking: Reported on 03/18/2019) 30 tablet 10  . norethindrone (MICRONOR) 0.35 MG tablet Take 1 tablet (0.35 mg total) by mouth daily. (Patient not taking: Reported on 03/18/2019) 1 Package 11  . oxyCODONE-acetaminophen (PERCOCET/ROXICET) 5-325 MG tablet Take 1-2 tablets by mouth every 4 (four) hours as needed for moderate pain. (Patient not taking: Reported on 03/18/2019) 30 tablet 0   No current facility-administered medications for this visit.     Review of Systems  Constitutional: Negative for chills, fatigue, fever and unexpected weight change.  HENT: Negative for trouble swallowing.  Eyes: Negative for loss of vision.  Respiratory: Negative for cough, shortness of breath and wheezing.  Cardiovascular: Negative for chest pain, leg swelling, palpitations and syncope.  GI: Negative for abdominal pain, blood in stool, diarrhea, nausea and vomiting.  GU: Negative for difficulty urinating, dysuria, frequency and hematuria.  Musculoskeletal: Negative for back pain, leg pain and joint pain.  Skin: Negative for rash.  Neurological: Negative for dizziness, headaches, light-headedness, numbness and seizures.  Psychiatric: Negative for behavioral problem, confusion, depressed mood and sleep disturbance.        Objective:  Objective   Vitals:   03/18/19 1438  BP: 140/90  Weight: 252 lb (114.3 kg)  Height: 5\' 6"  (1.676 m)   Body mass index is 40.67 kg/m.  Physical Exam Vitals signs and nursing note reviewed.  Constitutional:      Appearance: She is well-developed.  HENT:     Head: Normocephalic and atraumatic.  Eyes:     Pupils: Pupils are equal, round, and reactive to light.  Cardiovascular:     Rate and Rhythm: Normal rate and regular rhythm.  Pulmonary:     Effort: Pulmonary effort is normal. No respiratory distress.  Abdominal:     General: Abdomen is flat.     Palpations: Abdomen is soft.     Comments: Erythematous incision.  Small induration. Drainage of serosanguinous fluid from the corners of the incision. Not able to probe deeply with a q-tip. Wound culture collected and sent.   Skin:    General: Skin is warm and dry.  Neurological:     Mental Status: She is alert and oriented to person, place, and time.  Psychiatric:        Behavior: Behavior normal.        Thought Content: Thought content normal.        Judgment: Judgment normal.           Assessment/Plan:     23 yo with cesarean wound infection.  Wound is largely intact with openings in each corner.  Will direct admit for IV antibiotics. Patient given instruction and asked to proceed to the hospital.  Discussed cleaning of the wound twice a day.  Breast pump ordered for lactation support. Covid-19 testing ordered  More than 25 minutes were spent face to face with the patient in the room with more than 50% of the time spent providing counseling and discussing the plan of management.       Adelene Idlerhristanna  MD [facility], [facility] 03/18/2019 3:28 PM

## 2019-03-18 NOTE — Discharge Summary (Signed)
Physician Discharge Summary  Patient ID: Kimberly Farmer MRN: [id number] DOB/AGE: [birth year] 23 y.o.  Admit date: 03/18/2019 Discharge date: 03/18/2019  Admission Diagnoses: Incision cellulitis, yeast infection of skin  Discharge Diagnoses:  Active Problems:   Cesarean wound infection   Cellulitis and skin yeast infection  Discharged Condition: good  Hospital Course: Seen and examined.  Incision intact with two small areas of pin point drainage.  No palpable mass.  Non tender.  Skin yeast rash more so than erythema.  Most likely Heat Rash/ Fungal etiology. Treat w Diflucan dose given here and Lotrisone topical BID. Will prescribe Keflex as well to cover any cellulitis. Wound care and cleaning educated, Hibiclens.  Consults: None  Significant Diagnostic Studies: none  Treatments: none except meds  Discharge Exam: Blood pressure (!) 160/82, pulse 80, temperature 98.2 F (36.8 C), temperature source Oral, resp. rate 18, SpO2 100 %, currently breastfeeding. General appearance: alert, cooperative and no distress  INCISION as above  Disposition: Discharge disposition: 01-Home or Self Care       Discharge Instructions    Call MD for:  redness, tenderness, or signs of infection (pain, swelling, redness, odor or green/yellow discharge around incision site)   Complete by:  As directed    Call MD for:  severe uncontrolled pain   Complete by:  As directed    Call MD for:  temperature >100.4   Complete by:  As directed    Diet general   Complete by:  As directed    Driving Restrictions   Complete by:  As directed    No driving for 2 weeks   Increase activity slowly   Complete by:  As directed    Lifting restrictions   Complete by:  As directed    No heavy lifting (>30lbs) for 6 weeks   No dressing needed   Complete by:  As directed    Have dressing over incision removed prior to discharge.  Keep OnQ Pain Pump in place for 96 hours (4 days) then remove at home.   Sexual  Activity Restrictions   Complete by:  As directed    No sexual activity for 6 weeks     Allergies as of 03/18/2019      Reactions   Hydrocodone Other (See Comments)   Other Reaction: SEVERE HA & GI Upset   Oxycodone Other (See Comments)   Other Reaction: SEVERE HA & GI Upset   Tape Rash      Medication List    TAKE these medications   cephALEXin 500 MG capsule Commonly known as:  KEFLEX Take 1 capsule (500 mg total) by mouth 4 (four) times daily.   clotrimazole-betamethasone cream Commonly known as:  Lotrisone Apply to affected area 2 times daily   folic acid 1 MG tablet Commonly known as:  FOLVITE Take 1 tablet (1 mg total) by mouth daily.   ibuprofen 800 MG tablet Commonly known as:  ADVIL Take 1 tablet (800 mg total) by mouth every 6 (six) hours.   norethindrone 0.35 MG tablet Commonly known as:  MICRONOR Take 1 tablet (0.35 mg total) by mouth daily.   ONE A DAY PRENATAL PO Take by mouth.   oxyCODONE-acetaminophen 5-325 MG tablet Commonly known as:  PERCOCET/ROXICET Take 1-2 tablets by mouth every 4 (four) hours as needed for moderate pain.      Follow-up Information    Nadara Mustard, MD. Go to.   Specialty:  Obstetrics and Gynecology Contact information: [address] [place]  [place] [id number] [phone]           Signed: Letitia LibraRobert Paul Ryosuke Ericksen 03/18/2019, 8:23 PM

## 2019-03-18 NOTE — H&P (Signed)
See H&P per Dr Jerene Pitch today  Plan- treat as incisional cellulitis, as well as yeast infection Wound care instructions Keep overnight w IV ABX and reassess after 24 hours  Annamarie Major, MD, [facility], [facility] 03/18/2019  4:16 PM

## 2019-03-18 NOTE — Discharge Instructions (Signed)
Betamethasone; Clotrimazole skin cream What is this medicine? BETAMETHASONE; CLOTRIMAZOLE (bay ta METH a sone; kloe TRIM a zole) is a corticosteroid and antifungal cream. It treats ringworm and infections like jock itch and athlete's foot. It also helps reduce swelling, redness, and itching caused by these infections. This medicine may be used for other purposes; ask your health care provider or pharmacist if you have questions. COMMON BRAND NAME(S): Lotrisone What should I tell my health care provider before I take this medicine? They need to know if you have any of these conditions: -large areas of burned or damaged skin -skin thinning -peripheral vascular disease or poor circulation -an unusual or allergic reaction to betamethasone, clotrimazole, other corticosteroids, other antifungals, other medicines, foods, dyes, or preservatives -pregnant or trying to get pregnant -breast-feeding How should I use this medicine? This cream is for external use only. Do not take by mouth. Follow the directions on the prescription label. Wash your hands before and after use. If treating hand or nail infections, wash hands before use only. Apply a thin layer of cream to the affected area and rub in gently. Do not cover or wrap the treated area with an airtight bandage (like a plastic bandage). Use the cream for the full course of treatment prescribed, even if you think the condition is getting better. Use the medicine at regular intervals. Do not use more often than directed. Do not use on healthy skin or over large areas of skin. Do not use this medicine for any condition other than the one for which it was prescribed. When applying to the groin area, apply a small amount and do not use for longer than 2 weeks unless directed to by your doctor or health care professional. Do not get this cream in your eyes. If you do, rinse out with plenty of cool tap water. Talk to your pediatrician regarding the use of this  medicine in children. While this drug may be prescribed for children as young as 17 years for selected conditions, precautions do apply. Patients over 65 years old may have a stronger reaction and need a smaller dose. Overdosage: If you think you have taken too much of this medicine contact a poison control center or emergency room at once. NOTE: This medicine is only for you. Do not share this medicine with others. What if I miss a dose? If you miss a dose, use it as soon as you can. If it is almost time for your next dose, use only that dose. Do not use double or take extra doses. What may interact with this medicine? -topical products that have nystatin This list may not describe all possible interactions. Give your health care provider a list of all the medicines, herbs, non-prescription drugs, or dietary supplements you use. Also tell them if you smoke, drink alcohol, or use illegal drugs. Some items may interact with your medicine. What should I watch for while using this medicine? If using this medicine on your body or groin tell your doctor or health care professional if your symptoms do not improve within 1 week. If using this medicine on your feet tell your doctor or health care professional if your symptoms do not improve within 2 weeks. Tell your doctor if your skin infection returns after you stop using this cream. If you are using this cream for 'jock itch' be sure to dry the groin completely after bathing. Do not wear underwear that is tight-fitting or made from synthetic fibers like rayon or   nylon. Wear loose-fitting, cotton underwear. If you are using this cream for athlete's foot be sure to dry your feet carefully after bathing, especially between the toes. Do not wear socks made from wool or synthetic materials like rayon or nylon. Wear clean cotton socks and change them at least once a day, change them more if your feet sweat a lot. Also, try to wear sandals or shoes that are  well-ventilated. Do not use this cream to treat diaper rash. What side effects may I notice from receiving this medicine? Side effects that you should report to your doctor or health care professional as soon as possible: -allergic reactions like skin rash, itching or hives, swelling of the face, lips, or tongue -dark red spots on the skin -lack of healing of skin condition -loss of feeling on skin -painful, red, pus-filled blisters in hair follicles -skin infection -sores or blisters that do not heal properly -thinning of the skin or sunburn Side effects that usually do not require medical attention (report to your doctor or health care professional if they continue or are bothersome): -dry or peeling skin -minor skin irritation, burning, or itching This list may not describe all possible side effects. Call your doctor for medical advice about side effects. You may report side effects to FDA at 1-800-FDA-1088. Where should I keep my medicine? Keep out of the reach of children. Store at room temperature between 15 and 30 degrees C ( 59 and 86 degrees F). Do not freeze. Throw away any unused medicine after the expiration date. NOTE: This sheet is a summary. It may not cover all possible information. If you have questions about this medicine, talk to your doctor, pharmacist, or health care provider.  2019 Elsevier/Gold Standard (2008-01-01 16:14:28)  

## 2019-03-18 NOTE — Progress Notes (Signed)
Pharmacy Antibiotic Note  Kimberly Farmer is a 23 y.o. female admitted on 03/18/2019 with cellulitis.  Pharmacy has been consulted for Vancomycin dosing.  Plan: Vancomycin 2000mg  IV loading dose followed by Vancomycin 1250 mg IV Q 12 hrs. Goal AUC 400-550. Expected AUC: 485.5 SCr used: 0.8      Temp (24hrs), Avg:98.4 F (36.9 C), Min:98.4 F (36.9 C), Max:98.4 F (36.9 C)  Recent Labs  Lab 03/18/19 1629  CREATININE 0.60    Estimated Creatinine Clearance: 141.6 mL/min (by C-G formula based on SCr of 0.6 mg/dL).    Allergies  Allergen Reactions  . Hydrocodone Other (See Comments)    Other Reaction: SEVERE HA & GI Upset  . Oxycodone Other (See Comments)    Other Reaction: SEVERE HA & GI Upset  . Tape Rash    Antimicrobials this admission: Unasyn 6/2 >>  Vancomycin 6/2 >>   Thank you for allowing pharmacy to be a part of this patient's care.  Clovia Cuff, PharmD, BCPS 03/18/2019 5:23 PM

## 2019-03-19 ENCOUNTER — Telehealth: Payer: Self-pay | Admitting: Obstetrics & Gynecology

## 2019-03-19 NOTE — Telephone Encounter (Signed)
Patient is schedule for appointment 03/20/19 with [facility]

## 2019-03-19 NOTE — Progress Notes (Signed)
FYI

## 2019-03-19 NOTE — Telephone Encounter (Signed)
-----   Message from Nadara Mustard, MD sent at 03/18/2019  8:21 PM EDT ----- Regarding: appt Call and sch pt for appt w PH THURS ok to overbook

## 2019-03-20 ENCOUNTER — Ambulatory Visit: Payer: 59 | Admitting: Obstetrics & Gynecology

## 2019-03-21 ENCOUNTER — Ambulatory Visit (INDEPENDENT_AMBULATORY_CARE_PROVIDER_SITE_OTHER): Payer: 59 | Admitting: Obstetrics & Gynecology

## 2019-03-21 ENCOUNTER — Other Ambulatory Visit: Payer: Self-pay

## 2019-03-21 ENCOUNTER — Encounter: Payer: Self-pay | Admitting: Obstetrics & Gynecology

## 2019-03-21 VITALS — BP 100/70 | Ht 66.0 in | Wt 253.0 lb

## 2019-03-21 DIAGNOSIS — B372 Candidiasis of skin and nail: Secondary | ICD-10-CM

## 2019-03-21 DIAGNOSIS — Z98891 History of uterine scar from previous surgery: Secondary | ICD-10-CM

## 2019-03-21 NOTE — Progress Notes (Signed)
  History of Present Illness:  Kimberly Farmer is a 23 y.o. who was started on Keflex and Lotrisone following a skin infection diagnosis around her cesarean incision earlier this week. Since that time, she states that her symptoms are improving.  Less redness, no itching, no drainage, no mass, no fever.  PMHx: She  has a past medical history of Arthritis, Lumbar disc disease, and PCOS (polycystic ovarian syndrome). Also,  has a past surgical history that includes Knee surgery (Left, 2011); Tonsillectomy and adenoidectomy (2007); and Cesarean section (N/A, 02/21/2019)., family history includes Alcohol abuse in her father; Cancer in her paternal grandfather; Diabetes in her maternal grandmother; Hypertension in her maternal grandmother and mother.,  reports that she has never smoked. She has never used smokeless tobacco. She reports that she does not drink alcohol or use drugs.  Current Outpatient Medications:  .  ibuprofen (ADVIL) 800 MG tablet, Take 1 tablet (800 mg total) by mouth every 6 (six) hours., Disp: 30 tablet, Rfl: 0 .  cephALEXin (KEFLEX) 500 MG capsule, Take 1 capsule (500 mg total) by mouth 4 (four) times daily. (Patient not taking: Reported on 03/21/2019), Disp: 28 capsule, Rfl: 2 .  clotrimazole-betamethasone (LOTRISONE) cream, Apply to affected area 2 times daily (Patient not taking: Reported on 03/21/2019), Disp: 15 g, Rfl: 1 .  folic acid (FOLVITE) 1 MG tablet, Take 1 tablet (1 mg total) by mouth daily. (Patient not taking: Reported on 03/18/2019), Disp: 30 tablet, Rfl: 10 .  norethindrone (MICRONOR) 0.35 MG tablet, Take 1 tablet (0.35 mg total) by mouth daily. (Patient not taking: Reported on 03/18/2019), Disp: 1 Package, Rfl: 11 .  oxyCODONE-acetaminophen (PERCOCET/ROXICET) 5-325 MG tablet, Take 1-2 tablets by mouth every 4 (four) hours as needed for moderate pain. (Patient not taking: Reported on 03/18/2019), Disp: 30 tablet, Rfl: 0 .  Prenatal MV & Min w/FA-DHA (ONE A DAY PRENATAL PO), Take by  mouth., Disp: , Rfl:    Also, is allergic to hydrocodone; oxycodone; and tape..  Review of Systems  All other systems reviewed and are negative.   Physical Exam:  BP 100/70   Ht 5\' 6"  (1.676 m)   Wt 253 lb (114.8 kg)   BMI 40.84 kg/m  Body mass index is 40.84 kg/m. Constitutional: Well nourished, well developed female in no acute distress.  Abdomen: diffusely non tender to palpation, non distended, and no masses, hernias Incision: min rash/redness; poles of incision continue to have pinpoint separation w watery to no discharge; not able to deeply penetrate this separations.  No palpable ridge or mass underneath incision. No erythema streaking. Neuro: Grossly intact Psych:  Normal mood and affect.    Assessment:  Problem List Items Addressed This Visit    Yeast infection of the skin    -  Primary   S/P cesarean section         Medication treatment is going well for her abdominal wall skin yeast infection, possible cellulitis.  Plan: 1. Finish Keflex 2. Cont Lotrisone topically BID as needed 3. Monitor skin incision healing 4. F/u one week  A total of 15 minutes were spent face-to-face with the patient during this encounter and over half of that time dealt with counseling and coordination of care.  Annamarie Major, MD, [facility], [facility] 03/21/2019  10:12 AM

## 2019-03-23 LAB — AEROBIC/ANAEROBIC CULTURE W GRAM STAIN (SURGICAL/DEEP WOUND)

## 2019-03-26 ENCOUNTER — Telehealth: Payer: Self-pay | Admitting: Obstetrics & Gynecology

## 2019-03-26 ENCOUNTER — Encounter: Payer: Self-pay | Admitting: Obstetrics & Gynecology

## 2019-03-26 ENCOUNTER — Other Ambulatory Visit: Payer: Self-pay

## 2019-03-26 ENCOUNTER — Ambulatory Visit (INDEPENDENT_AMBULATORY_CARE_PROVIDER_SITE_OTHER): Payer: 59 | Admitting: Obstetrics & Gynecology

## 2019-03-26 VITALS — BP 120/80 | Ht 66.0 in | Wt 254.0 lb

## 2019-03-26 DIAGNOSIS — Z98891 History of uterine scar from previous surgery: Secondary | ICD-10-CM | POA: Diagnosis not present

## 2019-03-26 DIAGNOSIS — B372 Candidiasis of skin and nail: Secondary | ICD-10-CM

## 2019-03-26 MED ORDER — CLOTRIMAZOLE-BETAMETHASONE 1-0.05 % EX CREA: TOPICAL_CREAM | CUTANEOUS | 1 refills | Status: AC

## 2019-03-26 NOTE — Progress Notes (Signed)
  History of Present Illness:  Kimberly Farmer is a 23 y.o. who has been monitored for her skin infection, rash, and incisional healing concerns over the last 2 weeks.  She has finished Keflex, and has reduced the need and application of Lotrisone to the skin area in question.  Her incision still has 2 pin point areas that have not healed over, but no redness or drainage seen.  No fever.  PMHx: She  has a past medical history of Arthritis, Lumbar disc disease, and PCOS (polycystic ovarian syndrome). Also,  has a past surgical history that includes Knee surgery (Left, 2011); Tonsillectomy and adenoidectomy (2007); and Cesarean section (N/A, 02/21/2019)., family history includes Alcohol abuse in her father; Cancer in her paternal grandfather; Diabetes in her maternal grandmother; Hypertension in her maternal grandmother and mother.,  reports that she has never smoked. She has never used smokeless tobacco. She reports that she does not drink alcohol or use drugs. No outpatient medications have been marked as taking for the 03/26/19 encounter (Postpartum Visit) with Gae Dry, MD.  . Also, is allergic to hydrocodone; oxycodone; and tape..  Review of Systems  All other systems reviewed and are negative.  Physical Exam:  BP 120/80   Ht 5\' 6"  (1.676 m)   Wt 254 lb (115.2 kg)   BMI 41.00 kg/m  Body mass index is 41 kg/m. Constitutional: Well nourished, well developed female in no acute distress.  Abdomen: diffusely non tender to palpation, non distended, and no masses, hernias, no rash or erythema Incision: healing well, no deep disruption, no drainage Neuro: Grossly intact Psych:  Normal mood and affect.    Assessment:  Problem List Items Addressed This Visit    Yeast infection of the skin    -  Primary   Relevant Medications   clotrimazole-betamethasone (LOTRISONE) cream   S/P cesarean section        ALSO, contraceptio management discussed.    H/O menorrhagia    H/O pregnancy on pill    S/P CS    Option for Mirena IUD discussed as favorable option    Info provided    Plan to place at 6 week PP visit  A total of 15 minutes were spent face-to-face with the patient during this encounter and over half of that time dealt with counseling and coordination of care.  Barnett Applebaum, MD, [facility], [facility] 03/26/2019  4:49 PM

## 2019-03-26 NOTE — Telephone Encounter (Signed)
Patient scheduled 7/2 for 6 wk PP with IUD (not specified which) with [facility].

## 2019-03-27 NOTE — Telephone Encounter (Signed)
Noted. Will order to arrive by apt date/time. 

## 2019-04-17 ENCOUNTER — Ambulatory Visit: Payer: 59 | Admitting: Obstetrics & Gynecology

## 2019-05-15 NOTE — Telephone Encounter (Signed)
Pt no show. Mirena placed back in stock.

## 2019-06-06 ENCOUNTER — Ambulatory Visit: Payer: 59 | Admitting: Obstetrics & Gynecology

## 2019-07-08 ENCOUNTER — Ambulatory Visit: Payer: 59 | Admitting: Physician Assistant

## 2019-07-08 ENCOUNTER — Other Ambulatory Visit: Payer: Self-pay

## 2019-07-08 ENCOUNTER — Encounter: Payer: Self-pay | Admitting: Physician Assistant

## 2019-07-08 VITALS — BP 111/78 | HR 87 | Temp 97.3°F | Resp 16 | Wt 250.0 lb

## 2019-07-08 DIAGNOSIS — R21 Rash and other nonspecific skin eruption: Secondary | ICD-10-CM

## 2019-07-08 DIAGNOSIS — Z23 Encounter for immunization: Secondary | ICD-10-CM | POA: Diagnosis not present

## 2019-07-08 DIAGNOSIS — G43109 Migraine with aura, not intractable, without status migrainosus: Secondary | ICD-10-CM

## 2019-07-08 MED ORDER — TRIAMCINOLONE ACETONIDE 0.1 % EX CREA: 1.0000 "application " | TOPICAL_CREAM | Freq: Two times a day (BID) | CUTANEOUS | 0 refills | Status: DC

## 2019-07-08 MED ORDER — PROPRANOLOL HCL ER 60 MG PO CP24: 60.0000 mg | ORAL_CAPSULE | Freq: Every day | ORAL | 1 refills | Status: DC

## 2019-07-08 NOTE — Progress Notes (Signed)
Patient: Kimberly Farmer Female    DOB: 1996-08-03   23 y.o.   MRN: [id number] Visit Date: 07/08/2019  Today's Provider: Mar Daring, PA-C   Chief Complaint  Patient presents with  . Insect Bite   Subjective:     HPI   Patient states she noticed a insect bite on the back of her upper left leg 3 days. Patient states bite is purple and scaly looking. Patient states it does not itch or hurt. She just noticed 3 days ago while in the shower. She reports it was more flesh colored when she found it, just bumpy and felt weird to the touch (like old scar tissue). Then yesterday she noticed it was becoming more pink-red in color. Then today it is more purple appearing.   Patient also wanted to discuss migraine medication that would be safe to take while breastfeeding. Has used topiramate 50mg  in the past.   Allergies  Allergen Reactions  . Hydrocodone Other (See Comments)    Other Reaction: SEVERE HA & GI Upset  . Oxycodone Other (See Comments)    Other Reaction: SEVERE HA & GI Upset  . Tape Rash     Current Outpatient Medications:  .  Ascorbic Acid (VITAMIN C PO), Take by mouth., Disp: , Rfl:  .  ibuprofen (ADVIL) 800 MG tablet, Take 1 tablet (800 mg total) by mouth every 6 (six) hours., Disp: 30 tablet, Rfl: 0 .  Prenatal MV & Min w/FA-DHA (ONE A DAY PRENATAL PO), Take by mouth., Disp: , Rfl:  .  clotrimazole-betamethasone (LOTRISONE) cream, Apply to affected area 2 times daily (Patient not taking: Reported on 07/08/2019), Disp: 15 g, Rfl: 1 .  folic acid (FOLVITE) 1 MG tablet, Take 1 tablet (1 mg total) by mouth daily. (Patient not taking: Reported on 03/18/2019), Disp: 30 tablet, Rfl: 10 .  norethindrone (MICRONOR) 0.35 MG tablet, Take 1 tablet (0.35 mg total) by mouth daily. (Patient not taking: Reported on 03/18/2019), Disp: 1 Package, Rfl: 11  Review of Systems  Constitutional: Negative for appetite change, chills, fatigue and fever.  Respiratory: Negative for chest  tightness and shortness of breath.   Cardiovascular: Negative for chest pain and palpitations.  Gastrointestinal: Negative for abdominal pain, nausea and vomiting.  Skin: Positive for rash.  Neurological: Positive for headaches. Negative for dizziness and weakness.    Social History   Tobacco Use  . Smoking status: Never Smoker  . Smokeless tobacco: Never Used  . Tobacco comment: Vape - Reduced Nicotine  Substance Use Topics  . Alcohol use: No      Objective:   BP 111/78 (BP Location: Left Arm, Patient Position: Sitting, Cuff Size: Large)   Pulse 87   Temp (!) 97.3 F (36.3 C) (Other (Comment))   Resp 16   Wt 250 lb (113.4 kg)   SpO2 98%   BMI 40.35 kg/m  Vitals:   07/08/19 1559  BP: 111/78  Pulse: 87  Resp: 16  Temp: (!) 97.3 F (36.3 C)  TempSrc: Other (Comment)  SpO2: 98%  Weight: 250 lb (113.4 kg)  Body mass index is 40.35 kg/m.   Physical Exam Vitals signs reviewed.  Constitutional:      General: She is not in acute distress.    Appearance: Normal appearance. She is well-developed. She is not diaphoretic.  Neck:     Musculoskeletal: Normal range of motion and neck supple.     Thyroid: No thyromegaly.     Vascular:  No JVD.     Trachea: No tracheal deviation.  Cardiovascular:     Rate and Rhythm: Normal rate and regular rhythm.     Heart sounds: Normal heart sounds. No murmur. No friction rub. No gallop.   Pulmonary:     Effort: Pulmonary effort is normal. No respiratory distress.     Breath sounds: Normal breath sounds. No wheezing or rales.  Lymphadenopathy:     Cervical: No cervical adenopathy.  Skin:    Findings: Rash present. Rash is papular.       Neurological:     Mental Status: She is alert.      No results found for any visits on 07/08/19.     Assessment & Plan    1. Rash Unsure of cause. Will give triamcinolone as below to see if it will help lessen the color and size.  - triamcinolone cream (KENALOG) 0.1 %; Apply 1 application  topically 2 (two) times daily.  Dispense: 30 g; Refill: 0  2. Migraine with aura and without status migrainosus, not intractable Previously on topiramate 50mg  but stopped when she became pregnant. Will try propranolol as below to see if it helps prevent migraines. Call if not improving. - propranolol ER (INDERAL LA) 60 MG 24 hr capsule; Take 1 capsule (60 mg total) by mouth at bedtime.  Dispense: 90 capsule; Refill: 1  3. Need for influenza vaccination Flu vaccine given today without complication. Patient sat upright for 15 minutes to check for adverse reaction before being released. - Flu Vaccine QUAD 36+ mos IM     , PA-C  [facility]

## 2019-09-16 ENCOUNTER — Emergency Department
Admission: EM | Admit: 2019-09-16 | Discharge: 2019-09-16 | Disposition: A | Discharge status: Home / Self Care | Payer: Medicaid Other | Attending: Emergency Medicine | Admitting: Emergency Medicine

## 2019-09-16 ENCOUNTER — Ambulatory Visit: Payer: 59 | Admitting: Physician Assistant

## 2019-09-16 ENCOUNTER — Other Ambulatory Visit: Payer: Self-pay

## 2019-09-16 DIAGNOSIS — M62830 Muscle spasm of back: Secondary | ICD-10-CM | POA: Diagnosis not present

## 2019-09-16 DIAGNOSIS — Y999 Unspecified external cause status: Secondary | ICD-10-CM | POA: Diagnosis not present

## 2019-09-16 DIAGNOSIS — X509XXA Other and unspecified overexertion or strenuous movements or postures, initial encounter: Secondary | ICD-10-CM | POA: Diagnosis not present

## 2019-09-16 DIAGNOSIS — Y929 Unspecified place or not applicable: Secondary | ICD-10-CM | POA: Insufficient documentation

## 2019-09-16 DIAGNOSIS — S3992XA Unspecified injury of lower back, initial encounter: Secondary | ICD-10-CM | POA: Diagnosis present

## 2019-09-16 DIAGNOSIS — Z79899 Other long term (current) drug therapy: Secondary | ICD-10-CM | POA: Diagnosis not present

## 2019-09-16 DIAGNOSIS — S39012A Strain of muscle, fascia and tendon of lower back, initial encounter: Secondary | ICD-10-CM | POA: Diagnosis not present

## 2019-09-16 DIAGNOSIS — Y9389 Activity, other specified: Secondary | ICD-10-CM | POA: Diagnosis not present

## 2019-09-16 MED ORDER — IBUPROFEN 800 MG PO TABS
800.0000 mg | ORAL_TABLET | Freq: Once | ORAL | Status: AC
  Administered 2019-09-16: 800 mg via ORAL
  Filled 2019-09-16: qty 1

## 2019-09-16 MED ORDER — CYCLOBENZAPRINE HCL 10 MG PO TABS
10.0000 mg | ORAL_TABLET | Freq: Once | ORAL | Status: AC
  Administered 2019-09-16: 10 mg via ORAL
  Filled 2019-09-16: qty 1

## 2019-09-16 MED ORDER — PREDNISONE 20 MG PO TABS: 40.0000 mg | ORAL_TABLET | Freq: Every day | ORAL | 0 refills | Status: AC

## 2019-09-16 MED ORDER — IBUPROFEN 600 MG PO TABS: 600.0000 mg | ORAL_TABLET | Freq: Three times a day (TID) | ORAL | 0 refills | Status: AC

## 2019-09-16 MED ORDER — PREDNISONE 20 MG PO TABS
40.0000 mg | ORAL_TABLET | Freq: Once | ORAL | Status: AC
  Administered 2019-09-16: 40 mg via ORAL
  Filled 2019-09-16: qty 2

## 2019-09-16 MED ORDER — CYCLOBENZAPRINE HCL 10 MG PO TABS: 10.0000 mg | ORAL_TABLET | Freq: Two times a day (BID) | ORAL | 0 refills | Status: DC | PRN

## 2019-09-16 NOTE — ED Provider Notes (Signed)
[facility] Emergency Department Provider Note  ____________________________________________   First MD Initiated Contact with Patient 09/16/19 1305     (approximate)  I have reviewed the triage vital signs and the nursing notes.   HISTORY  Chief Complaint Back Pain    HPI Kimberly Farmer is a 23 y.o. female  Here with back pain. Pt reports that she has a h/o DDD. She was bending down today to pick up toys that her 23 yo threw down and felt acute onset of severe R paraspinal back pain. Since then, se's had aching, throbbing, cramp like pain in her RL back with occasional radiation down her leg. No loss of bowel or bladder function. No numbness, weakness. No other complaints. No fever, chills. H/o similar episodes, no h/o known cauda equina or central cord disease. She is currently breastfeeding. No recent weight loss, night sweats. Pain worse w/ any movement. No alleviating factors.       Past Medical History:  Diagnosis Date   Arthritis    Bilateral knees and back   Lumbar disc disease    PCOS (polycystic ovarian syndrome)     Patient Active Problem List   Diagnosis Date Noted   Cesarean wound infection 03/18/2019   Post-dates pregnancy 02/21/2019   Cephalopelvic disproportion 02/21/2019   Choroid plexus cysts, fetal, affecting care of mother, antepartum 09/17/2018   Breakthrough bleeding on birth control pills 05/16/2018   PCOS (polycystic ovarian syndrome) 03/05/2018   BMI 37.0-37.9, adult 10/11/2016   Chronic midline low back pain without sciatica 04/24/2016   Airway hyperreactivity 09/27/2015   F/H of alcoholism 09/27/2015   Bone disease 09/24/2014   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 Authorizing Provider  Ascorbic Acid (VITAMIN C PO) Take by mouth.    [provider]  clotrimazole-betamethasone (LOTRISONE) cream Apply to affected area 2 times daily Patient not taking: Reported on 07/08/2019 03/26/19 03/25/20  Nadara MustardHarris, Robert P, MD  cyclobenzaprine (FLEXERIL) 10 MG tablet Take 1 tablet (10 mg total) by mouth 2 (two) times daily as needed for muscle spasms. 09/16/19   Shaune PollackIsaacs, Melesa Lecy, MD  folic acid (FOLVITE) 1 MG tablet Take 1 tablet (1 mg total) by mouth daily. Patient not taking: Reported on 03/18/2019 08/14/18   Natale MilchSchuman, Christanna R, MD  ibuprofen (ADVIL) 600 MG tablet Take 1 tablet (600 mg total) by mouth 3 (three) times daily for 7 days. 09/16/19 09/23/19  Shaune PollackIsaacs, Vena Bassinger, MD  norethindrone (MICRONOR) 0.35 MG tablet Take 1 tablet (0.35 mg total) by mouth daily. Patient not taking: Reported on 03/18/2019 02/23/19   Vena AustriaStaebler, Andreas, MD  predniSONE (DELTASONE) 20 MG tablet Take 2 tablets (40 mg total) by mouth daily for 4 days. 09/16/19 09/20/19  Shaune PollackIsaacs, Kleo Dungee, MD  Prenatal MV & Min w/FA-DHA (ONE A DAY PRENATAL PO) Take by mouth.    [provider]  propranolol ER (INDERAL LA) 60 MG 24 hr capsule Take 1 capsule (60 mg total) by mouth at bedtime. 07/08/19   Margaretann LovelessBurnette, Jennifer M, PA-C  triamcinolone  cream (KENALOG) 0.1 % Apply 1 application topically 2 (two) times daily. 07/08/19   Mar Daring, PA-C    Allergies Hydrocodone, Oxycodone, and Tape  Family History  Problem Relation Age of Onset   Hypertension Mother    Alcohol abuse Father    Diabetes Maternal Grandmother    Hypertension Maternal Grandmother    Cancer Paternal Grandfather        skin    Social History Social History   Tobacco Use   Smoking status: Never Smoker     Smokeless tobacco: Never Used   Tobacco comment: Vape - Reduced Nicotine  Substance Use Topics   Alcohol use: No   Drug use: No    Review of Systems  Review of Systems  Constitutional: Positive for fatigue. Negative for chills and fever.  HENT: Negative for sore throat.   Respiratory: Negative for shortness of breath.   Cardiovascular: Negative for chest pain.  Gastrointestinal: Negative for abdominal pain.  Genitourinary: Negative for flank pain.  Musculoskeletal: Positive for arthralgias, back pain and gait problem. Negative for neck pain.  Skin: Negative for rash and wound.  Allergic/Immunologic: Negative for immunocompromised state.  Neurological: Negative for weakness and numbness.  Hematological: Does not bruise/bleed easily.     ____________________________________________  PHYSICAL EXAM:      VITAL SIGNS: ED Triage Vitals  Enc Vitals Group     BP 09/16/19 1231 119/70     Pulse Rate 09/16/19 1231 (!) 116     Resp 09/16/19 1231 18     Temp 09/16/19 1231 98.7 F (37.1 C)     Temp Source 09/16/19 1231 Oral     SpO2 09/16/19 1231 96 %     Weight 09/16/19 1234 250 lb (113.4 kg)     Height 09/16/19 1234 5\' 7"  (1.702 m)     Head Circumference --      Peak Flow --      Pain Score 09/16/19 1230 10     Pain Loc --      Pain Edu? --      Excl. in [place]? --      Physical Exam Vitals signs and nursing note reviewed.  Constitutional:      General: She is not in acute distress.    Appearance: She is well-developed.  HENT:     Head: Normocephalic and atraumatic.  Eyes:     Conjunctiva/sclera: Conjunctivae normal.  Neck:     Musculoskeletal: Neck supple.  Cardiovascular:     Rate and Rhythm: Normal rate and regular rhythm.     Heart sounds: Normal heart sounds. No murmur. No friction rub.  Pulmonary:     Effort: Pulmonary effort is normal. No respiratory distress.     Breath sounds: Normal breath sounds. No wheezing or rales.  Abdominal:     General: There  is no distension.     Palpations: Abdomen is soft.     Tenderness: There is no abdominal tenderness.  Skin:    General: Skin is warm.     Capillary Refill: Capillary refill takes less than 2 seconds.  Neurological:     Mental Status: She is alert and oriented to person, place, and time.     Motor: No abnormal muscle tone.      Spine Exam: Inspection/Palpation: Severe R paraspinal TTP over lower lumbar spine, no deformity, no bruising.  Strength: 5/5 throughout LE bilaterally (hip flexion/extension, adduction/abduction; knee flexion/extension; foot dorsiflexion/plantarflexion, inversion/eversion; great toe inversion) Sensation: Intact to light touch in proximal  and distal LE bilaterally Reflexes: 2+ quadriceps and achilles reflexes   ____________________________________________   LABS (all labs ordered are listed, but only abnormal results are displayed)  Labs Reviewed - No data to display  ____________________________________________  EKG: None ________________________________________  RADIOLOGY All imaging, including plain films, CT scans, and ultrasounds, independently reviewed by me, and interpretations confirmed via formal radiology reads.  ED MD interpretation:   None  Official radiology report(s): No results found.  ____________________________________________  PROCEDURES   Procedure(s) performed (including Critical Care):  Procedures  ____________________________________________  INITIAL IMPRESSION / MDM / ASSESSMENT AND PLAN / ED COURSE  As part of my medical decision making, I reviewed the following data within the electronic MEDICAL RECORD NUMBER Nursing notes reviewed and incorporated, Old chart reviewed, Notes from prior ED visits, and [place] Controlled Substance Database       *Kimberly Farmer was evaluated in Emergency Department on 09/16/2019 for the symptoms described in the history of present illness. She was evaluated in the context of the global  COVID-19 pandemic, which necessitated consideration that the patient might be at risk for infection with the SARS-CoV-2 virus that causes COVID-19. Institutional protocols and algorithms that pertain to the evaluation of patients at risk for COVID-19 are in a state of rapid change based on information released by regulatory bodies including the CDC and federal and state organizations. These policies and algorithms were followed during the patient's care in the ED.  Some ED evaluations and interventions may be delayed as a result of limited staffing during the pandemic.*     Medical Decision Making:  23 yo F here with R paraspinal back pain s/p bending/twisting injury. Pain si c/w likely paraspinal spasm with possible DDD, mild sciatica. No loss of bowel/bladder function or red flags to suggest cauda equina or cord compression. Distal strength, sensation, and pulses are intact. Will give brief, safe low dose course of prednisone and motrin w/ flexeril PRN and outpt follow-up. Avoid narcotics 2/2 breastfeeding.  ____________________________________________  FINAL CLINICAL IMPRESSION(S) / ED DIAGNOSES  Final diagnoses:  Strain of lumbar region, initial encounter  Paraspinal muscle spasm     MEDICATIONS GIVEN DURING THIS VISIT:  Medications  ibuprofen (ADVIL) tablet 800 mg (has no administration in time range)  predniSONE (DELTASONE) tablet 40 mg (has no administration in time range)  cyclobenzaprine (FLEXERIL) tablet 10 mg (has no administration in time range)     ED Discharge Orders         Ordered    predniSONE (DELTASONE) 20 MG tablet  Daily     09/16/19 1334    ibuprofen (ADVIL) 600 MG tablet  3 times daily     09/16/19 1334    cyclobenzaprine (FLEXERIL) 10 MG tablet  2 times daily PRN     09/16/19 1334           Note:  This document was prepared using Dragon voice recognition software and may include unintentional dictation errors.   Shaune Pollack, MD 09/16/19 1335

## 2019-09-16 NOTE — ED Triage Notes (Addendum)
Pt arrives via [facility] from home for c/o 10/10 back pain. Pt reports she was cleaning up her child's toys and bent over and statted having excruciating back pain. Pt crying in pain when being transferred from EMS stretcher to bed. Pt has hx DDD. Pt is still breastfeeding.

## 2019-09-19 ENCOUNTER — Ambulatory Visit: Payer: 59 | Admitting: Physician Assistant

## 2019-12-01 ENCOUNTER — Ambulatory Visit (INDEPENDENT_AMBULATORY_CARE_PROVIDER_SITE_OTHER): Payer: 59 | Admitting: Physician Assistant

## 2019-12-01 ENCOUNTER — Encounter: Payer: Self-pay | Admitting: Physician Assistant

## 2019-12-01 ENCOUNTER — Other Ambulatory Visit: Payer: Self-pay

## 2019-12-01 VITALS — BP 128/88 | HR 86 | Temp 97.6°F | Resp 16 | Wt 252.3 lb

## 2019-12-01 DIAGNOSIS — M25532 Pain in left wrist: Secondary | ICD-10-CM | POA: Diagnosis not present

## 2019-12-01 DIAGNOSIS — R251 Tremor, unspecified: Secondary | ICD-10-CM | POA: Diagnosis not present

## 2019-12-01 DIAGNOSIS — R4701 Aphasia: Secondary | ICD-10-CM

## 2019-12-01 DIAGNOSIS — M25572 Pain in left ankle and joints of left foot: Secondary | ICD-10-CM | POA: Diagnosis not present

## 2019-12-01 DIAGNOSIS — Z9889 Other specified postprocedural states: Secondary | ICD-10-CM | POA: Diagnosis not present

## 2019-12-01 MED ORDER — METHYLPREDNISOLONE 4 MG PO TBPK: ORAL_TABLET | ORAL | 0 refills | Status: DC

## 2019-12-01 NOTE — Progress Notes (Signed)
Patient: Kimberly Farmer Female    DOB: Nov 18, 1995   24 y.o.   MRN: [id number] Visit Date: 12/01/2019  Today's Provider: Margaretann Loveless, PA-C   Chief Complaint  Patient presents with  . Referral   Subjective:     HPI  Patient reports that her left ankle has been hurting and worsening for the past 3 days. She denies any injury to the ankle. Does not hurt much to touch but is very painful to put weight on the left ankle. Does have h/o bilateral knee surgeries, most recent was 2017 (notes are in Epic). Has also had 2 pregnancies (2018 and 2020) with weight gain during pregnancy that may be contributory. Reports most pain is on the medial side just below the medial malleolus. Radiates sometimes to the heel and near the achilles tendon insertion.  She also complains of left wrist pain and weakness. Reports pain starts around the 1st metacarpal and radiates over across the carpal tunnel to the left ulnar area. Does cause some hand weakness with grip. Denies numbness, but then states she does have pins and needle sensations intermittently.   She also c/o that for the past 2 month ago she has been having random moments that her body is "vibrating" and feels like she is just stuck. The longest it has lasted is for 5 seconds and she just can't figured out what it is. Reports she will just stop talking and be staring off into space. She does not visibly shake but feels the vibration sensation internally. Her boyfriend has seen her have these episodes and states she just stops and stares. She recalls the episodes and has no post ictal state. No seizure history.   Allergies  Allergen Reactions  . Hydrocodone Other (See Comments)    Other Reaction: SEVERE HA & GI Upset  . Oxycodone Other (See Comments)    Other Reaction: SEVERE HA & GI Upset  . Tape Rash     Current Outpatient Medications:  .  ascorbic acid (VITAMIN C) 250 MG CHEW, Chew 250 mg by mouth daily., Disp: , Rfl:  .   Multiple Vitamin (MULTI-VITAMIN PO), Take by mouth daily., Disp: , Rfl:  .  Ascorbic Acid (VITAMIN C PO), Take by mouth., Disp: , Rfl:  .  clotrimazole-betamethasone (LOTRISONE) cream, Apply to affected area 2 times daily (Patient not taking: Reported on 07/08/2019), Disp: 15 g, Rfl: 1 .  cyclobenzaprine (FLEXERIL) 10 MG tablet, Take 1 tablet (10 mg total) by mouth 2 (two) times daily as needed for muscle spasms. (Patient not taking: Reported on 12/01/2019), Disp: 20 tablet, Rfl: 0 .  folic acid (FOLVITE) 1 MG tablet, Take 1 tablet (1 mg total) by mouth daily. (Patient not taking: Reported on 03/18/2019), Disp: 30 tablet, Rfl: 10 .  norethindrone (MICRONOR) 0.35 MG tablet, Take 1 tablet (0.35 mg total) by mouth daily. (Patient not taking: Reported on 03/18/2019), Disp: 1 Package, Rfl: 11 .  Prenatal MV & Min w/FA-DHA (ONE A DAY PRENATAL PO), Take by mouth., Disp: , Rfl:  .  propranolol ER (INDERAL LA) 60 MG 24 hr capsule, Take 1 capsule (60 mg total) by mouth at bedtime. (Patient not taking: Reported on 12/01/2019), Disp: 90 capsule, Rfl: 1 .  triamcinolone cream (KENALOG) 0.1 %, Apply 1 application topically 2 (two) times daily. (Patient not taking: Reported on 12/01/2019), Disp: 30 g, Rfl: 0  Review of Systems  Constitutional: Negative.   Respiratory: Negative.   Cardiovascular: Negative.  Gastrointestinal: Negative.   Musculoskeletal: Positive for arthralgias, gait problem and joint swelling.  Neurological: Negative for weakness and numbness.    Social History   Tobacco Use  . Smoking status: Never Smoker  . Smokeless tobacco: Never Used  . Tobacco comment: Vape - Reduced Nicotine  Substance Use Topics  . Alcohol use: No      Objective:   BP 128/88 (BP Location: Left Arm, Patient Position: Sitting, Cuff Size: Large)   Pulse 86   Temp 97.6 F (36.4 C) (Temporal)   Resp 16   Wt 252 lb 4.8 oz (114.4 kg)   BMI 39.52 kg/m  Vitals:   12/01/19 1619  BP: 128/88  Pulse: 86  Resp: 16    Temp: 97.6 F (36.4 C)  TempSrc: Temporal  Weight: 252 lb 4.8 oz (114.4 kg)  Body mass index is 39.52 kg/m.   Physical Exam Vitals reviewed.  Constitutional:      General: She is not in acute distress.    Appearance: Normal appearance. She is well-developed. She is obese. She is not ill-appearing or diaphoretic.  Cardiovascular:     Rate and Rhythm: Normal rate and regular rhythm.     Pulses: Normal pulses.     Heart sounds: Normal heart sounds. No murmur. No friction rub. No gallop.   Pulmonary:     Effort: Pulmonary effort is normal. No respiratory distress.     Breath sounds: Normal breath sounds. No wheezing or rales.  Musculoskeletal:     Left wrist: Tenderness present. No swelling, deformity, effusion, lacerations, bony tenderness, snuff box tenderness or crepitus. Normal range of motion. Normal pulse.     Cervical back: Normal range of motion and neck supple. No spinous process tenderness or muscular tenderness.     Right ankle: Normal.     Left ankle: No swelling, deformity, ecchymosis or lacerations. Tenderness present. Normal range of motion.     Left Achilles Tendon: Normal.     Comments: Patient was tender under the medial malleolus along the posterior tibial tendon. Also noted to have tight achilles tendon with some tenderness along and tenderness over insertion Pes planus noted Left wrist had tenderness over the extensor pollicis brevus and abductor pollicis longus, but radiates over the carpal tunnel region. Negative tinel sign, positive phalen test, negative Finklestein test  Skin:    General: Skin is warm and dry.     Capillary Refill: Capillary refill takes less than 2 seconds.  Neurological:     General: No focal deficit present.     Mental Status: She is alert. Mental status is at baseline.     Cranial Nerves: No cranial nerve deficit.     Gait: Gait abnormal (antalgic).  Psychiatric:        Mood and Affect: Mood normal.        Behavior: Behavior normal.         Thought Content: Thought content normal.        Judgment: Judgment normal.      No results found for any visits on 12/01/19.     Assessment & Plan    1. Acute left ankle pain Suspect posterior tibial tendinitis/strain and achilles tendon is palpably tight with mild tenderness over insertion. No visible swelling over achilles bursa. Does have brace on ankle today. Advised to use moist heat, exercises printed on AVS. Stretching is important. Good arch support. Medrol dose pak given since NSAIDs have not been effective. Referral placed to orthopedics as below.  - Ambulatory  referral to Orthopedic Surgery - methylPREDNISolone (MEDROL DOSEPAK) 4 MG TBPK tablet; 6 day taper; take as directed on package instructions  Dispense: 21 tablet; Refill: 0 - ANA,IFA RA Diag Pnl w/rflx Tit/Patn  2. Left wrist pain Suspect mild carpal tunnel vs tendinitis (havig to lift her kids). Medrol dose pak given as below. Since she has had multiple orthopedic surgeries at such a young age and now having other joints involved, will check for autoimmune sources like RA. edrol given for inflammation. Referral placed for ankle and will also send for hand.  - Ambulatory referral to Orthopedic Surgery - methylPREDNISolone (MEDROL DOSEPAK) 4 MG TBPK tablet; 6 day taper; take as directed on package instructions  Dispense: 21 tablet; Refill: 0 - ANA,IFA RA Diag Pnl w/rflx Tit/Patn  3. S/P knee surgery See above medical treatment plan. - ANA,IFA RA Diag Pnl w/rflx Tit/Patn  4. Tremor Unsure of cause. Will check labs as below to evaluate for vit deficiency or other cause for these episodes she is having. If all labs are normal I will refer to Neurology for further evaluation for possible pseudo-seizure type syndrome.  - CBC w/Diff/Platelet - Comprehensive Metabolic Panel (CMET) - TSH - Vitamin D (25 hydroxy) - B12 and Folate Panel  5. Aphasia During episodes she cannot talk, has more of a frozen appearance, but  is aware and feels herself interanlly "vibrating". See above medical treatment plan. - CBC w/Diff/Platelet - Comprehensive Metabolic Panel (CMET) - TSH - Vitamin D (25 hydroxy) - B12 and Folate Panel     Margaretann Loveless, PA-C  [facility]

## 2019-12-01 NOTE — Patient Instructions (Signed)
Carpal Tunnel Syndrome  Carpal tunnel syndrome is a condition that causes pain in your hand and arm. The carpal tunnel is a narrow area located on the palm side of your wrist. Repeated wrist motion or certain diseases may cause swelling within the tunnel. This swelling pinches the main nerve in the wrist (median nerve). What are the causes? This condition may be caused by:  Repeated wrist motions.  Wrist injuries.  Arthritis.  A cyst or tumor in the carpal tunnel.  Fluid buildup during pregnancy. Sometimes the cause of this condition is not known. What increases the risk? The following factors may make you more likely to develop this condition:  Having a job, such as being a butcher or a cashier, that requires you to repeatedly move your wrist in the same motion.  Being a woman.  Having certain conditions, such as: ? Diabetes. ? Obesity. ? An underactive thyroid (hypothyroidism). ? Kidney failure. What are the signs or symptoms? Symptoms of this condition include:  A tingling feeling in your fingers, especially in your thumb, index, and middle fingers.  Tingling or numbness in your hand.  An aching feeling in your entire arm, especially when your wrist and elbow are bent for a long time.  Wrist pain that goes up your arm to your shoulder.  Pain that goes down into your palm or fingers.  A weak feeling in your hands. You may have trouble grabbing and holding items. Your symptoms may feel worse during the night. How is this diagnosed? This condition is diagnosed with a medical history and physical exam. You may also have tests, including:  Electromyogram (EMG). This test measures electrical signals sent by your nerves into the muscles.  Nerve conduction study. This test measures how well electrical signals pass through your nerves.  Imaging tests, such as X-rays, ultrasound, and MRI. These tests check for possible causes of your condition. How is this treated? This  condition may be treated with:  Lifestyle changes. It is important to stop or change the activity that caused your condition.  Doing exercise and activities to strengthen your muscles and bones (physical therapy).  Learning how to use your hand again after diagnosis (occupational therapy).  Medicines for pain and inflammation. This may include medicine that is injected into your wrist.  A wrist splint.  Surgery. Follow these instructions at home: If you have a splint:  Wear the splint as told by your health care provider. Remove it only as told by your health care provider.  Loosen the splint if your fingers tingle, become numb, or turn cold and blue.  Keep the splint clean.  If the splint is not waterproof: ? Do not let it get wet. ? Cover it with a watertight covering when you take a bath or shower. Managing pain, stiffness, and swelling   If directed, put ice on the painful area: ? If you have a removable splint, remove it as told by your health care provider. ? Put ice in a plastic bag. ? Place a towel between your skin and the bag. ? Leave the ice on for 20 minutes, 2-3 times per day. General instructions  Take over-the-counter and prescription medicines only as told by your health care provider.  Rest your wrist from any activity that may be causing your pain. If your condition is work related, talk with your employer about changes that can be made, such as getting a wrist pad to use while typing.  Do any exercises as told   by your health care provider, physical therapist, or occupational therapist.  Keep all follow-up visits as told by your health care provider. This is important. Contact a health care provider if:  You have new symptoms.  Your pain is not controlled with medicines.  Your symptoms get worse. Get help right away if:  You have severe numbness or tingling in your wrist or hand. Summary  Carpal tunnel syndrome is a condition that causes pain in  your hand and arm.  It is usually caused by repeated wrist motions.  Lifestyle changes and medicines are used to treat carpal tunnel syndrome. Surgery may be recommended.  Follow your health care provider's instructions about wearing a splint, resting from activity, keeping follow-up visits, and calling for help. This information is not intended to replace advice given to you by your health care provider. Make sure you discuss any questions you have with your health care provider. Document Revised: 02/08/2018 Document Reviewed: 02/08/2018 Elsevier Patient Education  2020 [employer]   Posterior Tibial Tendinitis Posterior tibial tendinitis is irritation of a tendon called the posterior tibial tendon. Your posterior tibial tendon is a cord-like tissue that connects bones of your lower leg and foot to a muscle that:  Supports your arch.  Helps you raise up on your toes.  Helps you turn your foot down and in. This condition causes foot and ankle pain. It can also lead to a flat foot. What are the causes? This condition is most often caused by repeated stress to the tendon (overuse injury). It can also be caused by a sudden injury that stresses the tendon, such as landing on your foot after jumping or falling. What increases the risk? This condition is more likely to develop in:  People who play a sport that involves putting a lot of pressure on the feet, such as: ? Basketball. ? Tennis. ? Soccer. ? Hockey.  Runners.  Females who are older than 24 years of age and are overweight.  People with diabetes.  People with decreased foot stability.  People with flat feet. What are the signs or symptoms? Symptoms include:  Pain in the inner ankle.  Pain at the arch of your foot.  Pain that gets worse with running, walking, or standing.  Swelling on the inside of your ankle and foot.  Weakness in your ankle or foot.  Inability to stand up on tiptoe.  Flattening of the  arch of your foot. How is this diagnosed? This condition may be diagnosed based on:  Your symptoms.  Your medical history.  A physical exam.  Tests, such as: ? X-ray. ? MRI. ? Ultrasound. How is this treated? This condition may be treated by:  Putting ice to the injured area.  Taking NSAIDs, such as ibuprofen, to reduce pain and swelling.  Wearing a special shoe or shoe insert to support your arch (orthotic).  Having physical therapy.  Replacing high-impact exercise with low-impact exercise, such as swimming or cycling. If your symptoms do not improve with these treatments, you may need to wear a splint, removable walking boot, or short leg cast for 6-8 weeks to keep your foot and ankle still (immobilized). Follow these instructions at home: If you have a cast, splint, or boot:  Keep it clean and dry.  Check the skin around it every day. Tell your health care provider about any concerns. If you have a cast:  Do not stick anything inside it to scratch your skin. Doing that increases your risk of  infection.  You may put lotion on dry skin around the edges of the cast. Do not put lotion on the skin underneath the cast. If you have a splint or boot:  Wear it as told by your health care provider. Remove it only as told by your health care provider.  Loosen it if your toes tingle, become numb, or turn cold and blue. Bathing  Do not take baths, swim, or use a hot tub until your health care provider approves. Ask your health care provider if you may take showers.  If your cast, splint, or boot is not waterproof: ? Do not let it get wet. ? Cover it with a waterproof covering while you take a bath or a shower. Managing pain and swelling   If directed, put ice on the injured area. ? If you have a removable splint or boot, remove it as told by your health care provider. ? Put ice in a plastic bag. ? Place a towel between your skin and the bag or between your cast and the  bag. ? Leave the ice on for 20 minutes, 2-3 times a day.  Move your toes often to reduce stiffness and swelling.  Raise (elevate) the injured area above the level of your heart while you are sitting or lying down. Activity  Do not use the injured foot to support your body weight until your health care provider says that you can. Use crutches as told by your health care provider.  Do not do activities that make pain or swelling worse.  Ask your health care provider when it is safe to drive if you have a cast, splint, or boot on your foot.  Return to your normal activities as told by your health care provider. Ask your health care provider what activities are safe for you.  Do exercises as told by your health care provider. General instructions  Take over-the-counter and prescription medicines only as told by your health care provider.  If you have an orthotic, use it as told by your health care provider.  Keep all follow-up visits as told by your health care provider. This is important. How is this prevented?  Wear footwear that is appropriate to your athletic activity.  Avoid athletic activities that cause pain or swelling in your ankle or foot.  Before being active, do range-of-motion and stretching exercises.  If you develop pain or swelling while training, stop training.  If you have pain or swelling that does not improve after a few days of rest, see your health care provider.  If you start a new athletic activity, start gradually so you can build up your strength and flexibility. Contact a health care provider if:  Your symptoms get worse.  Your symptoms do not improve in 6-8 weeks.  You develop new, unexplained symptoms.  Your splint, boot, or cast gets damaged. Summary  Posterior tibial tendinitis is irritation of a tendon called the posterior tibial tendon.  This condition is most often caused by repeated stress to the tendon (overuse injury).  This  condition causes foot pain and ankle pain. It can also lead to a flat foot.  This condition may be treated by not doing high-impact activities, applying ice, having physical therapy, wearing orthotics, and wearing a cast, splint, or boot if needed. This information is not intended to replace advice given to you by your health care provider. Make sure you discuss any questions you have with your health care provider. Document Revised: 01/28/2019 Document  Reviewed: 12/05/2018 Elsevier Patient Education  [employer].

## 2020-01-15 ENCOUNTER — Telehealth: Payer: Self-pay

## 2020-01-15 NOTE — Telephone Encounter (Signed)
Copied from CRM [id number]. Topic: General - Call Back - No Documentation >> Jan 15, 2020  3:22 PM Randol Kern wrote: Best contact: [phone]  Pt is requesting a call back from clinic regarding lab orders, please advise

## 2020-01-16 NOTE — Telephone Encounter (Signed)
Attempted to call patient about her lab question. Unable to contact patient and VM is not set up.

## 2020-02-19 ENCOUNTER — Telehealth: Payer: Self-pay | Admitting: Obstetrics & Gynecology

## 2020-02-19 ENCOUNTER — Other Ambulatory Visit: Payer: Self-pay

## 2020-02-19 ENCOUNTER — Encounter: Payer: Self-pay | Admitting: Obstetrics & Gynecology

## 2020-02-19 ENCOUNTER — Ambulatory Visit (INDEPENDENT_AMBULATORY_CARE_PROVIDER_SITE_OTHER): Payer: Medicaid Other | Admitting: Obstetrics & Gynecology

## 2020-02-19 VITALS — BP 120/80 | Ht 66.0 in | Wt 258.0 lb

## 2020-02-19 DIAGNOSIS — Z30011 Encounter for initial prescription of contraceptive pills: Secondary | ICD-10-CM

## 2020-02-19 MED ORDER — DROSPIRENONE-ETHINYL ESTRADIOL 3-0.02 MG PO TABS: 1.0000 | ORAL_TABLET | Freq: Every day | ORAL | 11 refills | Status: DC

## 2020-02-19 NOTE — Telephone Encounter (Signed)
Attempt to reach patient to reschedule annual exam. Voicemail not set up unable to leave message.

## 2020-02-19 NOTE — Telephone Encounter (Signed)
-----   Message from Nadara Mustard, MD sent at 02/19/2020 10:51 AM EDT ----- Regarding: appt Call and reschedule Annual Appt (we could not do it today bc of kids)

## 2020-02-19 NOTE — Progress Notes (Signed)
Contraception Counseling Patient presents for contraception counseling. The patient has no complaints today. The patient is sexually active. Pertinent past medical history: none.  Pt has been breast feeding for the last year, since delivery by CS one year ago.  No period during this year. Prior h/o PCOS, and has had irreg periods in past as well as painful and heavy ones.  Is worried about that resuming.  Pt is concerned about weight gain as well.    PMHx: She  has a past medical history of Arthritis, Lumbar disc disease, and PCOS (polycystic ovarian syndrome). Also,  has a past surgical history that includes Knee surgery (Left, 2011); Tonsillectomy and adenoidectomy (2007); and Cesarean section (N/A, 02/21/2019)., family history includes Alcohol abuse in her father; Cancer in her paternal grandfather; Diabetes in her maternal grandmother; Hypertension in her maternal grandmother and mother.,  reports that she has never smoked. She has never used smokeless tobacco. She reports that she does not drink alcohol or use drugs.  She has a current medication list which includes the following prescription(s): ascorbic acid, ascorbic acid, clotrimazole-betamethasone, cyclobenzaprine, drospirenone-ethinyl estradiol, folic acid, methylprednisolone, multiple vitamin, norethindrone, prenatal mv & min w/fa-dha, propranolol er, and triamcinolone cream. Also, is allergic to hydrocodone; oxycodone; and tape.  Review of Systems  Constitutional: Negative for chills, fever and malaise/fatigue.  HENT: Negative for congestion, sinus pain and sore throat.   Eyes: Negative for blurred vision and pain.  Respiratory: Negative for cough and wheezing.   Cardiovascular: Negative for chest pain and leg swelling.  Gastrointestinal: Negative for abdominal pain, constipation, diarrhea, heartburn, nausea and vomiting.  Genitourinary: Negative for dysuria, frequency, hematuria and urgency.  Musculoskeletal: Positive for joint pain.  Negative for back pain, myalgias and neck pain.  Skin: Negative for itching and rash.  Neurological: Positive for dizziness, tingling and headaches. Negative for tremors and weakness.  Endo/Heme/Allergies: Does not bruise/bleed easily.  Psychiatric/Behavioral: Positive for depression. The patient is nervous/anxious. The patient does not have insomnia.     Objective: BP 120/80   Ht 5\' 6"  (1.676 m)   Wt 258 lb (117 kg)   BMI 41.64 kg/m  Physical Exam Constitutional:      General: She is not in acute distress.    Appearance: She is well-developed.  Musculoskeletal:        General: Normal range of motion.  Neurological:     Mental Status: She is alert and oriented to person, place, and time.  Skin:    General: Skin is warm and dry.  Vitals reviewed.     ASSESSMENT/PLAN:    Problem List Items Addressed This Visit    Encounter for initial prescription of contraceptive pills    -  [facility]  OCPs The risks /benefits of OCPs have been explained to the patient in detail.  Product literature has been given to her.  I have instructed her in the use of OCPs and have given her literature reinforcing this information.  I have explained to the patient that OCPs are not as effective for birth control during the first month of use, and that another form of contraception should be used during this time.  Both first-day start and Sunday start have been explained.  The risks and benefits of each was discussed.  She has been made aware of  the fact that other medications may affect the efficacy of OCPs.  I have answered all of her questions, and I believe that she has an understanding of the effectiveness and  use of OCPs.  PAP next month (unable to today due to 2 children w her needing attention)  A total of 20 minutes were spent face-to-face with the patient as well as preparation, review, communication, and documentation during this encounter.   Annamarie Major, MD, [facility], [facility] 02/19/2020  10:46 AM

## 2020-03-17 ENCOUNTER — Ambulatory Visit (INDEPENDENT_AMBULATORY_CARE_PROVIDER_SITE_OTHER): Payer: 59 | Admitting: Physician Assistant

## 2020-03-17 ENCOUNTER — Encounter: Payer: Self-pay | Admitting: Physician Assistant

## 2020-03-17 ENCOUNTER — Other Ambulatory Visit: Payer: Self-pay

## 2020-03-17 DIAGNOSIS — K2900 Acute gastritis without bleeding: Secondary | ICD-10-CM | POA: Diagnosis not present

## 2020-03-17 MED ORDER — SUCRALFATE 1 G PO TABS: 1.0000 g | ORAL_TABLET | Freq: Three times a day (TID) | ORAL | 0 refills | Status: DC

## 2020-03-17 NOTE — Patient Instructions (Signed)
Gastritis, Adult Gastritis is inflammation of the stomach. There are two kinds of gastritis:  Acute gastritis. This kind develops suddenly.  Chronic gastritis. This kind is much more common and lasts for a long time. Gastritis happens when the lining of the stomach becomes weak or gets damaged. Without treatment, gastritis can lead to stomach bleeding and ulcers. What are the causes? This condition may be caused by:  An infection.  Drinking too much alcohol.  Certain medicines. These include steroids, antibiotics, and some over-the-counter medicines, such as aspirin or ibuprofen.  Having too much acid in the stomach.  A disease of the intestines or stomach.  Stress.  An allergic reaction.  Crohn's disease.  Some cancer treatments (radiation). Sometimes the cause of this condition is not known. What are the signs or symptoms? Symptoms of this condition include:  Pain or a burning sensation in the upper abdomen.  Nausea.  Vomiting.  An uncomfortable feeling of fullness after eating.  Weight loss.  Bad breath.  Blood in your vomit or stools. In some cases, there are no symptoms. How is this diagnosed? This condition may be diagnosed with:  Your medical history and a description of your symptoms.  A physical exam.  Tests. These can include: ? Blood tests. ? Stool tests. ? A test in which a thin, flexible instrument with a light and a camera is passed down the esophagus and into the stomach (upper endoscopy). ? A test in which a sample of tissue is taken for testing (biopsy). How is this treated? This condition may be treated with medicines. The medicines that are used vary depending on the cause of the gastritis:  If the condition is caused by a bacterial infection, you may be given antibiotic medicines.  If the condition is caused by too much acid in the stomach, you may be given medicines called H2 blockers, proton pump inhibitors, or antacids. Treatment  may also involve stopping the use of certain medicines, such as aspirin, ibuprofen, or other NSAIDs. Follow these instructions at home: Medicines  Take over-the-counter and prescription medicines only as told by your health care provider.  If you were prescribed an antibiotic medicine, take it as told by your health care provider. Do not stop taking the antibiotic even if you start to feel better. Eating and drinking   Eat small, frequent meals instead of large meals.  Avoid foods and drinks that make your symptoms worse.  Drink enough fluid to keep your urine pale yellow. Alcohol use  Do not drink alcohol if: ? Your health care provider tells you not to drink. ? You are pregnant, may be pregnant, or are planning to become pregnant.  If you drink alcohol: ? Limit your use to:  0-1 drink a day for women.  0-2 drinks a day for men. ? Be aware of how much alcohol is in your drink. In the [place] one drink equals one 12 oz bottle of beer (355 mL), one 5 oz glass of wine (148 mL), or one 1 oz glass of hard liquor (44 mL). General instructions  Talk with your health care provider about ways to manage stress, such as getting regular exercise or practicing deep breathing, meditation, or yoga.  Do not use any products that contain nicotine or tobacco, such as cigarettes and e-cigarettes. If you need help quitting, ask your health care provider.  Keep all follow-up visits as told by your health care provider. This is important. Contact a health care provider if:  Your   symptoms get worse.  Your symptoms return after treatment. Get help right away if:  You vomit blood or material that looks like coffee grounds.  You have black or dark red stools.  You are unable to keep fluids down.  Your abdominal pain gets worse.  You have a fever.  You do not feel better after one week. Summary  Gastritis is inflammation of the lining of the stomach that can occur suddenly (acute) or  develop slowly over time (chronic).  This condition is diagnosed with a medical history, a physical exam, or tests.  This condition may be treated with medicines to treat infection or medicines to reduce the amount of acid in your stomach.  Follow your health care provider's instructions about taking medicines, making changes to your diet, and knowing when to call for help. This information is not intended to replace advice given to you by your health care provider. Make sure you discuss any questions you have with your health care provider. Document Revised: 02/19/2018 Document Reviewed: 02/19/2018 Elsevier Patient Education  2020 [employer]  

## 2020-03-17 NOTE — Progress Notes (Signed)
Established patient visit   Patient: Kimberly Farmer   DOB: January 25, 1996   24 y.o. Female  MRN: [id number] Visit Date: 03/17/2020  Today's healthcare provider: Mar Daring, PA-C   Chief Complaint  Patient presents with  . Abdominal Pain   Subjective    Abdominal Pain This is a new problem. The current episode started 1 to 4 weeks ago. The onset quality is sudden. The problem occurs constantly. The problem has been unchanged. The pain is located in the suprapubic region. The quality of the pain is cramping. The abdominal pain radiates to the periumbilical region. Associated symptoms include nausea (with severe pain). Pertinent negatives include no constipation, diarrhea, dysuria, fever, frequency, headaches, hematuria or vomiting. The pain is aggravated by movement (Breathing). The pain is relieved by nothing. Treatments tried: IBU.   Pain started approx 1.5 weeks ago. Feels pain just above her incision from her C-section last year. Pain radiates up from the area just above the incision, straight up midline to just below the sternum in an upside down "T" pattern. Feels like a "humming/burning". Movements and sitting make it worse. Also trying to go from lying to sitting aggravate it. When it is at its worst it is associated with nausea. There have been no urinary symptoms, no vaginal symptoms, and no bowel changes. Reports she goes to the bathroom 3-4 times per day for BM and there has been no change in consistency. Reports normal consistency. She has also taken 8 pregnancy test at home and all have been negative. She was given Rx for Yaz from her OB/GYN, Dr. Kenton Kingfisher, but never picked up. States she is scared of adverse effects again.   Patient Active Problem List   Diagnosis Date Noted  . Cesarean wound infection 03/18/2019  . Post-dates pregnancy 02/21/2019  . Cephalopelvic disproportion 02/21/2019  . Choroid plexus cysts, fetal, affecting care of mother, antepartum 09/17/2018    . Breakthrough bleeding on birth control pills 05/16/2018  . PCOS (polycystic ovarian syndrome) 03/05/2018  . BMI 37.0-37.9, adult 10/11/2016  . Chronic midline low back pain without sciatica 04/24/2016  . 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 F/H of alcoholism 09/27/2015  . Bone disease 09/24/2014  . 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 Arthritis    Bilateral knees and back  . Lumbar disc disease   . PCOS (polycystic ovarian syndrome)        Medications: Outpatient Medications Prior to Visit  Medication Sig  . Ascorbic Acid (VITAMIN C PO) Take by mouth.  Marland Kitchen ascorbic acid (VITAMIN C) 250 MG CHEW Chew 250 mg by mouth daily.  . clotrimazole-betamethasone (LOTRISONE) cream Apply to affected area 2 times daily (Patient not taking: Reported on 07/08/2019)  . cyclobenzaprine (FLEXERIL) 10 MG tablet Take 1 tablet (10 mg total) by mouth 2 (two) times daily as needed for muscle spasms. (Patient not taking: Reported on 12/01/2019)  . drospirenone-ethinyl estradiol (YAZ) 3-0.02 MG tablet Take 1 tablet by mouth daily. (Patient not taking: Reported on 03/17/2020)  . folic acid (FOLVITE) 1 MG tablet Take 1 tablet (1 mg total) by mouth daily. (Patient not taking: Reported on 03/18/2019)  . methylPREDNISolone (MEDROL DOSEPAK) 4 MG TBPK tablet 6 day taper; take as directed on package instructions (Patient not taking: Reported on 02/19/2020)  . Multiple Vitamin (MULTI-VITAMIN PO) Take by mouth daily.  . norethindrone (MICRONOR) 0.35 MG tablet Take 1 tablet (0.35 mg total)  by mouth daily. (Patient not taking: Reported on 03/18/2019)  . Prenatal MV & Min w/FA-DHA (ONE A DAY PRENATAL PO) Take by mouth.  . propranolol ER (INDERAL LA) 60 MG 24 hr capsule Take 1 capsule (60 mg total) by mouth at bedtime. (Patient not taking: Reported on 12/01/2019)  . triamcinolone cream (KENALOG) 0.1 % Apply 1 application topically 2  (two) times daily. (Patient not taking: Reported on 12/01/2019)   No facility-administered medications prior to visit.    Review of Systems  Constitutional: Negative for fatigue and fever.  Respiratory: Negative for cough and shortness of breath.   Cardiovascular: Negative for chest pain, palpitations and leg swelling.  Gastrointestinal: Positive for abdominal pain and nausea (with severe pain). Negative for abdominal distention, anal bleeding, blood in stool, constipation, diarrhea, rectal pain and vomiting.  Genitourinary: Negative for dysuria, enuresis, flank pain, frequency, genital sores, hematuria, menstrual problem, pelvic pain, urgency, vaginal bleeding, vaginal discharge and vaginal pain.  Neurological: Negative for dizziness, weakness, numbness and headaches.    Last CBC Lab Results  Component Value Date   WBC 14.7 (H) 02/22/2019   HGB 8.5 (L) 02/22/2019   HCT 27.7 (L) 02/22/2019   MCV 78.7 (L) 02/22/2019   MCH 24.1 (L) 02/22/2019   RDW 14.3 02/22/2019   PLT 263 02/22/2019   Last metabolic panel Lab Results  Component Value Date   GLUCOSE 91 03/18/2019   NA 137 03/18/2019   K 4.1 03/18/2019   CL 107 03/18/2019   CO2 22 03/18/2019   BUN 8 03/18/2019   CREATININE 0.60 03/18/2019   GFRNONAA >60 03/18/2019   GFRAA >60 03/18/2019   CALCIUM 8.8 (L) 03/18/2019   PROT 6.8 02/21/2019   ALBUMIN 2.8 (L) 02/21/2019   BILITOT 0.3 02/21/2019   ALKPHOS 153 (H) 02/21/2019   AST 19 02/21/2019   ALT 9 02/21/2019   ANIONGAP 8 03/18/2019      Objective    There were no vitals taken for this visit. BP Readings from Last 3 Encounters:  02/19/20 120/80  12/01/19 128/88  09/16/19 (!) 113/57   Wt Readings from Last 3 Encounters:  02/19/20 258 lb (117 kg)  12/01/19 252 lb 4.8 oz (114.4 kg)  09/16/19 250 lb (113.4 kg)      Physical Exam Constitutional:      General: She is not in acute distress.    Appearance: Normal appearance. She is well-developed. She is obese. She  is not ill-appearing or diaphoretic.  Cardiovascular:     Rate and Rhythm: Normal rate and regular rhythm.     Heart sounds: Normal heart sounds. No murmur. No friction rub. No gallop.   Pulmonary:     Effort: Pulmonary effort is normal. No respiratory distress.     Breath sounds: Normal breath sounds. No wheezing or rales.  Abdominal:     General: Abdomen is flat. A surgical scar is present. Bowel sounds are normal. There is no distension.     Palpations: Abdomen is soft. There is no mass.     Tenderness: There is abdominal tenderness in the epigastric area, periumbilical area, left upper quadrant and left lower quadrant. There is no guarding or rebound. Negative signs include Rovsing's sign, McBurney's sign, psoas sign and obturator sign.  Skin:    General: Skin is warm and dry.  Neurological:     Mental Status: She is alert and oriented to person, place, and time.      No results found for any visits on 03/17/20.  Assessment &  Plan     1. Acute gastritis without hemorrhage, unspecified gastritis type DDx: gastritis, PUD, atypical gallbladder presentation, atypical appendicitis. Will start Sucralfate as below since patient is still breastfeeding. Advised patient if symptoms do not improve in a week's time or if they worsen in the meantime she should call and come back in for labs and imaging. She agrees.  - sucralfate (CARAFATE) 1 g tablet; Take 1 tablet (1 g total) by mouth 4 (four) times daily -  with meals and at bedtime.  Dispense: 120 tablet; Refill: 0   No follow-ups on file.      Delmer Islam, PA-C, have reviewed all documentation for this visit. The documentation on 03/17/20 for the exam, diagnosis, procedures, and orders are all accurate and complete.   Reine Just  [facility] [phone] (phone) [phone] (fax)  [facility]

## 2020-06-07 ENCOUNTER — Encounter: Payer: Self-pay | Admitting: Physician Assistant

## 2020-06-07 ENCOUNTER — Telehealth (INDEPENDENT_AMBULATORY_CARE_PROVIDER_SITE_OTHER): Payer: 59 | Admitting: Physician Assistant

## 2020-06-07 ENCOUNTER — Other Ambulatory Visit: Payer: Self-pay

## 2020-06-07 VITALS — Wt 248.0 lb

## 2020-06-07 DIAGNOSIS — Z6841 Body Mass Index (BMI) 40.0 and over, adult: Secondary | ICD-10-CM | POA: Diagnosis not present

## 2020-06-07 DIAGNOSIS — Z713 Dietary counseling and surveillance: Secondary | ICD-10-CM | POA: Diagnosis not present

## 2020-06-07 MED ORDER — VITAMIN D 25 MCG (1000 UNIT) PO TABS: 1000.0000 [IU] | ORAL_TABLET | Freq: Every day | ORAL | 1 refills | Status: DC

## 2020-06-07 MED ORDER — PHENTERMINE HCL 37.5 MG PO TABS: 37.5000 mg | ORAL_TABLET | Freq: Every day | ORAL | 2 refills | Status: DC

## 2020-06-07 MED ORDER — B COMPLEX PO TABS: 1.0000 | ORAL_TABLET | Freq: Every day | ORAL | 1 refills | Status: DC

## 2020-06-07 NOTE — Patient Instructions (Signed)
Phentermine tablets or capsules What is this medicine? PHENTERMINE (FEN ter meen) decreases your appetite. It is used with a reduced calorie diet and exercise to help you lose weight. This medicine may be used for other purposes; ask your health care provider or pharmacist if you have questions. COMMON BRAND NAME(S): Adipex-P, Atti-Plex P, Atti-Plex P Spansule, Fastin, Lomaira, Pro-Fast, Tara-8 What should I tell my health care provider before I take this medicine? They need to know if you have any of these conditions:  agitation or nervousness  diabetes  glaucoma  heart disease  high blood pressure  history of drug abuse or addiction  history of stroke  kidney disease  lung disease called Primary Pulmonary Hypertension (PPH)  taken an MAOI like Carbex, Eldepryl, Marplan, Nardil, or Parnate in last 14 days  taking stimulant medicines for attention disorders, weight loss, or to stay awake  thyroid disease  an unusual or allergic reaction to phentermine, other medicines, foods, dyes, or preservatives  pregnant or trying to get pregnant  breast-feeding How should I use this medicine? Take this medicine by mouth with a glass of water. Follow the directions on the prescription label. Take your medicine at regular intervals. Do not take it more often than directed. Do not stop taking except on your doctor's advice. Talk to your pediatrician regarding the use of this medicine in children. While this drug may be prescribed for children 17 years or older for selected conditions, precautions do apply. Overdosage: If you think you have taken too much of this medicine contact a poison control center or emergency room at once. NOTE: This medicine is only for you. Do not share this medicine with others. What if I miss a dose? If you miss a dose, take it as soon as you can. If it is almost time for your next dose, take only that dose. Do not take double or extra doses. What may interact  with this medicine? Do not take this medicine with any of the following medications:  MAOIs like Carbex, Eldepryl, Marplan, Nardil, and Parnate This medicine may also interact with the following medications:  alcohol  certain medicines for depression, anxiety, or psychotic disorders  certain medicines for high blood pressure  linezolid  medicines for colds or breathing difficulties like pseudoephedrine or phenylephrine  medicines for diabetes  sibutramine  stimulant medicines for attention disorders, weight loss, or to stay awake This list may not describe all possible interactions. Give your health care provider a list of all the medicines, herbs, non-prescription drugs, or dietary supplements you use. Also tell them if you smoke, drink alcohol, or use illegal drugs. Some items may interact with your medicine. What should I watch for while using this medicine? Visit your doctor or health care provider for regular checks on your progress. Do not stop taking except on your health care provider's advice. You may develop a severe reaction. Your health care provider will tell you how much medicine to take. Do not take this medicine close to bedtime. It may prevent you from sleeping. You may get drowsy or dizzy. Do not drive, use machinery, or do anything that needs mental alertness until you know how this medicine affects you. Do not stand or sit up quickly, especially if you are an older patient. This reduces the risk of dizzy or fainting spells. Alcohol may increase dizziness and drowsiness. Avoid alcoholic drinks. This medicine may affect blood sugar levels. Ask your healthcare provider if changes in diet or medicines are needed   if you have diabetes. Women should inform their health care provider if they wish to become pregnant or think they might be pregnant. Losing weight while pregnant is not advised and may cause harm to the unborn child. Talk to your health care provider for more  information. What side effects may I notice from receiving this medicine? Side effects that you should report to your doctor or health care professional as soon as possible:  allergic reactions like skin rash, itching or hives, swelling of the face, lips, or tongue  breathing problems  changes in emotions or moods  changes in vision  chest pain or chest tightness  fast, irregular heartbeat  feeling faint or lightheaded  increased blood pressure  irritable  restlessness  tremors  seizures  signs and symptoms of a stroke like changes in vision; confusion; trouble speaking or understanding; severe headaches; sudden numbness or weakness of the face, arm or leg; trouble walking; dizziness; loss of balance or coordination  unusually weak or tired Side effects that usually do not require medical attention (report to your doctor or health care professional if they continue or are bothersome):  changes in taste  constipation or diarrhea  dizziness  dry mouth  headache  trouble sleeping  upset stomach This list may not describe all possible side effects. Call your doctor for medical advice about side effects. You may report side effects to FDA at 1-800-FDA-1088. Where should I keep my medicine? Keep out of the reach of children. This medicine can be abused. Keep your medicine in a safe place to protect it from theft. Do not share this medicine with anyone. Selling or giving away this medicine is dangerous and against the law. This medicine may cause harm and death if it is taken by other adults, children, or pets. Return medicine that has not been used to an official disposal site. Contact the DEA at 1-800-882-9539 or your city/county government to find a site. If you cannot return the medicine, mix any unused medicine with a substance like cat litter or coffee grounds. Then throw the medicine away in a sealed container like a sealed bag or coffee can with a lid. Do not use the  medicine after the expiration date. Store at room temperature between 20 and 25 degrees C (68 and 77 degrees F). Keep container tightly closed. NOTE: This sheet is a summary. It may not cover all possible information. If you have questions about this medicine, talk to your doctor, pharmacist, or health care provider.  2020 Elsevier/Gold Standard (2019-08-08 12:54:20)  

## 2020-06-07 NOTE — Progress Notes (Signed)
MyChart Video Visit    Virtual Visit via Video Note   This visit type was conducted due to national recommendations for restrictions regarding the COVID-19 Pandemic (e.g. social distancing) in an effort to limit this patient's exposure and mitigate transmission in our community. This patient is at least at moderate risk for complications without adequate follow up. This format is felt to be most appropriate for this patient at this time. Physical exam was limited by quality of the video and audio technology used for the visit.   I connected with Kimberly Farmer on 06/07/20 at  4:00 PM EDT by a video enabled telemedicine application and verified that I am speaking with the correct person using two identifiers.  I discussed the limitations of evaluation and management by telemedicine and the availability of in person appointments. The patient expressed understanding and agreed to proceed.  Patient location: Home Provider location: [facility]   Patient: Kimberly Farmer   DOB: 1996/03/10   24 y.o. Female  MRN: [id number] Visit Date: 06/07/2020  Today's healthcare provider: Margaretann Loveless, PA-C   Chief Complaint  Patient presents with  . Obesity   Subjective    HPI  Obesity: patient seeing provider today for weight loss counseling. She reports that she already started with this journey with exercising and eating better. She is counting calories and following a low carb diet. She has been swimming and walking 30-45 minutes twice a day. So far she reports that she has lost 12 lbs.  Wt Readings from Last 3 Encounters:  06/07/20 248 lb (112.5 kg)  02/19/20 258 lb (117 kg)  12/01/19 252 lb 4.8 oz (114.4 kg)    Patient Active Problem List   Diagnosis Date Noted  . Cesarean wound infection 03/18/2019  . Post-dates pregnancy 02/21/2019  . Cephalopelvic disproportion 02/21/2019  . Choroid plexus cysts, fetal, affecting care of mother, antepartum 09/17/2018  . Breakthrough bleeding on  birth control pills 05/16/2018  . PCOS (polycystic ovarian syndrome) 03/05/2018  . BMI 37.0-37.9, adult 10/11/2016  . Chronic midline low back pain without sciatica 04/24/2016  . 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 F/H of alcoholism 09/27/2015  . Bone disease 09/24/2014  . 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 Arthritis    Bilateral knees and back  . Lumbar disc disease   . PCOS (polycystic ovarian syndrome)       Medications: Outpatient Medications Prior to Visit  Medication Sig  . Ascorbic Acid (VITAMIN C PO) Take by mouth.  Marland Kitchen ascorbic acid (VITAMIN C) 250 MG CHEW Chew 250 mg by mouth daily.  . cyclobenzaprine (FLEXERIL) 10 MG tablet Take 1 tablet (10 mg total) by mouth 2 (two) times daily as needed for muscle spasms. (Patient not taking: Reported on 12/01/2019)  . drospirenone-ethinyl estradiol (YAZ) 3-0.02 MG tablet Take 1 tablet by mouth daily. (Patient not taking: Reported on 03/17/2020)  . folic acid (FOLVITE) 1 MG tablet Take 1 tablet (1 mg total) by mouth daily. (Patient not taking: Reported on 03/18/2019)  . methylPREDNISolone (MEDROL DOSEPAK) 4 MG TBPK tablet 6 day taper; take as directed on package instructions (Patient not taking: Reported on 02/19/2020)  . Multiple Vitamin (MULTI-VITAMIN PO) Take by mouth daily.  . norethindrone (MICRONOR) 0.35 MG tablet Take 1 tablet (0.35 mg total) by mouth daily. (Patient not taking: Reported on 03/18/2019)  . Prenatal MV & Min w/FA-DHA (ONE A DAY PRENATAL PO)  Take by mouth.  . propranolol ER (INDERAL LA) 60 MG 24 hr capsule Take 1 capsule (60 mg total) by mouth at bedtime. (Patient not taking: Reported on 12/01/2019)  . sucralfate (CARAFATE) 1 g tablet Take 1 tablet (1 g total) by mouth 4 (four) times daily -  with meals and at bedtime.  . topiramate (TOPAMAX) 50 MG tablet Take 50 mg by mouth 2 (two) times daily.  Marland Kitchen triamcinolone cream (KENALOG)  0.1 % Apply 1 application topically 2 (two) times daily. (Patient not taking: Reported on 12/01/2019)   No facility-administered medications prior to visit.    Review of Systems  Constitutional: Negative.   Respiratory: Negative.   Cardiovascular: Negative.   Gastrointestinal: Negative.   Psychiatric/Behavioral: Negative.     Last CBC Lab Results  Component Value Date   WBC 14.7 (H) 02/22/2019   HGB 8.5 (L) 02/22/2019   HCT 27.7 (L) 02/22/2019   MCV 78.7 (L) 02/22/2019   MCH 24.1 (L) 02/22/2019   RDW 14.3 02/22/2019   PLT 263 02/22/2019   Last metabolic panel Lab Results  Component Value Date   GLUCOSE 91 03/18/2019   NA 137 03/18/2019   K 4.1 03/18/2019   CL 107 03/18/2019   CO2 22 03/18/2019   BUN 8 03/18/2019   CREATININE 0.60 03/18/2019   GFRNONAA >60 03/18/2019   GFRAA >60 03/18/2019   CALCIUM 8.8 (L) 03/18/2019   PROT 6.8 02/21/2019   ALBUMIN 2.8 (L) 02/21/2019   BILITOT 0.3 02/21/2019   ALKPHOS 153 (H) 02/21/2019   AST 19 02/21/2019   ALT 9 02/21/2019   ANIONGAP 8 03/18/2019   Last lipids No results found for: CHOL, HDL, LDLCALC, LDLDIRECT, TRIG, CHOLHDL    Objective    Wt 248 lb (112.5 kg)   BMI 40.03 kg/m  BP Readings from Last 3 Encounters:  02/19/20 120/80  12/01/19 128/88  09/16/19 (!) 113/57   Wt Readings from Last 3 Encounters:  06/07/20 248 lb (112.5 kg)  02/19/20 258 lb (117 kg)  12/01/19 252 lb 4.8 oz (114.4 kg)      Physical Exam Vitals reviewed.  Constitutional:      Appearance: Normal appearance. She is well-developed.  HENT:     Head: Normocephalic and atraumatic.  Pulmonary:     Effort: Pulmonary effort is normal. No respiratory distress.  Musculoskeletal:     Cervical back: Normal range of motion and neck supple.  Neurological:     Mental Status: She is alert.  Psychiatric:        Mood and Affect: Mood normal.        Behavior: Behavior normal.        Thought Content: Thought content normal.        Judgment:  Judgment normal.        Assessment & Plan     1. Class 3 severe obesity due to excess calories with serious comorbidity and body mass index (BMI) of 40.0 to 44.9 in adult [facility]) Counseled patient on healthy lifestyle modifications including dieting and exercise.  Will start phentermine to help with weight loss. Has lost 12 pounds on her own thus far. F/U in 3 months if desired to continue. - phentermine (ADIPEX-P) 37.5 MG tablet; Take 1 tablet (37.5 mg total) by mouth daily before breakfast.  Dispense: 30 tablet; Refill: 2  2. Encounter for weight loss counseling See above medical treatment plan. - phentermine (ADIPEX-P) 37.5 MG tablet; Take 1 tablet (37.5 mg total) by mouth daily before breakfast.  Dispense: 30 tablet; Refill: 2   No follow-ups on file.     I discussed the assessment and treatment plan with the patient. The patient was provided an opportunity to ask questions and all were answered. The patient agreed with the plan and demonstrated an understanding of the instructions.   The patient was advised to call back or seek an in-person evaluation if the symptoms worsen or if the condition fails to improve as anticipated.  I provided 18 minutes of non-face-to-face time during this encounter.  Delmer Islam, PA-C, have reviewed all documentation for this visit. The documentation on 06/09/20 for the exam, diagnosis, procedures, and orders are all accurate and complete.  Reine Just [facility] [phone] (phone) [phone] (fax)  [facility]

## 2020-07-08 ENCOUNTER — Telehealth: Payer: Self-pay

## 2020-07-08 MED ORDER — TOPIRAMATE 50 MG PO TABS: 50.0000 mg | ORAL_TABLET | Freq: Two times a day (BID) | ORAL | 1 refills | Status: DC

## 2020-07-08 NOTE — Telephone Encounter (Signed)
Copied from CRM [id number]. Topic: General - Other >> Jul 08, 2020  9:21 AM Dalphine Handing A wrote: Patient stated that she was recently seen by [facility] and wants to know if Rosezetta Schlatter will send her prescription for topiramate (TOPAMAX) 50 MG tablet in for her migraines. Patient requesting callback from [facility] nurse. Please advise

## 2020-07-08 NOTE — Telephone Encounter (Signed)
refilled 

## 2020-07-13 ENCOUNTER — Other Ambulatory Visit: Payer: Self-pay | Admitting: *Deleted

## 2020-07-13 ENCOUNTER — Telehealth: Payer: Self-pay | Admitting: Physician Assistant

## 2020-07-13 NOTE — Telephone Encounter (Signed)
Addendum: Topamax was refilled on 07/08/20 and sent to [facility] on [address], [place].  Pt. Was made aware of this with previous phone call.

## 2020-07-13 NOTE — Telephone Encounter (Signed)
PT need a refill  topiramate (TOPAMAX) 50 MG tablet [[id number]]  [id number] [place], [place] - [address]  [address] [place] [address]  Phone: [phone] Fax: [phone]

## 2020-07-13 NOTE — Telephone Encounter (Signed)
Called pt. To inquire if she checked with the pharmacy re: refill on Topamax.  Pt. Stated she [facility] not [facility] in [place].  Reported she just moved, and did not realize that [facility], at [address] would have the new refill sent there. Advised to contact them, and if there is any problem to call us back.  Pt. Verb. Understanding. Agreed with plan.

## 2020-07-13 NOTE — Addendum Note (Signed)
Addended by: Phillips Odor on: 07/13/2020 02:43 PM   Modules accepted: Orders

## 2020-08-05 ENCOUNTER — Encounter: Payer: 59 | Admitting: Obstetrics and Gynecology

## 2020-08-05 ENCOUNTER — Telehealth: Payer: Self-pay

## 2020-08-05 DIAGNOSIS — Z3201 Encounter for pregnancy test, result positive: Secondary | ICD-10-CM

## 2020-08-05 NOTE — Telephone Encounter (Signed)
Pt advised.   Thanks,   -Laramie Gelles  

## 2020-08-05 NOTE — Telephone Encounter (Signed)
Copied from CRM [id number]. Topic: Appointment Scheduling - Scheduling Inquiry for Clinic >> Aug 05, 2020 10:26 AM Leafy Ro wrote: Reason for CRM: Pt had an appt with ob-gyn today and was over the 5 min grace period pt said she was 6 minutes. Pt would like to have a blood test to see how far along she is pregnant

## 2020-08-05 NOTE — Telephone Encounter (Signed)
Lab ordered, she can come at her convenience.

## 2020-08-07 LAB — BETA HCG QUANT (REF LAB): hCG Quant: 55727 m[IU]/mL

## 2020-08-09 ENCOUNTER — Telehealth: Payer: Self-pay

## 2020-08-09 NOTE — Telephone Encounter (Signed)
Please review for [name].   Thanks,   -Xzavien Harada  

## 2020-08-09 NOTE — Telephone Encounter (Signed)
Copied from CRM [id number]. Topic: General - Other >> Aug 09, 2020  4:07 PM Laural Benes, [place] C wrote: Reason for CRM: pt called in to ask if her provider could assist her with getting an ultra sound. Pt says that she is unable to be seen at a OBGYN's office soon.    Pt would like further assistance.

## 2020-08-10 ENCOUNTER — Other Ambulatory Visit: Payer: Self-pay

## 2020-08-10 ENCOUNTER — Telehealth (INDEPENDENT_AMBULATORY_CARE_PROVIDER_SITE_OTHER): Payer: 59 | Admitting: Family Medicine

## 2020-08-10 ENCOUNTER — Encounter: Payer: Self-pay | Admitting: Family Medicine

## 2020-08-10 DIAGNOSIS — Z3490 Encounter for supervision of normal pregnancy, unspecified, unspecified trimester: Secondary | ICD-10-CM | POA: Diagnosis not present

## 2020-08-10 NOTE — Telephone Encounter (Signed)
Pt states she is not sure how far along she is.  She has very irregular periods and her last period was in May.  She says some of the medicines she was taking before she realized she was pregnant could cause heart problems for the baby.  She is too nervous to wait until the end of November to be seen.  She has a MyChart visit with you today at 1pm.   Thanks,   -Vernona Rieger

## 2020-08-10 NOTE — Telephone Encounter (Signed)
We can order ultrasounds, but would need to know what it was for and may require an appointment.  If this is a routine pregnancy ultrasound, would defer to [facility]

## 2020-08-10 NOTE — Progress Notes (Signed)
MyChart Video Visit    Virtual Visit via Video Note   This visit type was conducted due to national recommendations for restrictions regarding the COVID-19 Pandemic (e.g. social distancing) in an effort to limit this patient's exposure and mitigate transmission in our community. This patient is at least at moderate risk for complications without adequate follow up. This format is felt to be most appropriate for this patient at this time. Physical exam was limited by quality of the video and audio technology used for the visit.    Patient location: home Provider location: [facility] Persons involved in the visit: patient, provider  I discussed the limitations of evaluation and management by telemedicine and the availability of in person appointments. The patient expressed understanding and agreed to proceed.  Patient: Kimberly Farmer   DOB: 11-23-1995   24 y.o. Female  MRN: [id number] Visit Date: 08/10/2020  Today's healthcare provider: Shirlee Latch, MD   No chief complaint on file.  Subjective    HPI  Periods are irregular, only every 3-4 months.  Does not know  How far along she is.  Positive pregnancy test on 10/14 (taken due to nausea). Hcg quant 55k on 10/22.    She is worried that she was taking phentermine and topamax, and saw that these could cause cleft lip or heart development issues.    She cannot be seen by OB for a few more weeks.  No fetal movements yet. Some L sided abd pain that is short-lived and self resolves.    Social History   Tobacco Use  . Smoking status: Never Smoker  . Smokeless tobacco: Never Used  . Tobacco comment: Vape - Reduced Nicotine  Vaping Use  . Vaping Use: Former  Substance Use Topics  . Alcohol use: No  . Drug use: No      Medications: Outpatient Medications Prior to Visit  Medication Sig  . b complex vitamins tablet Take 1 tablet by mouth daily.  . cholecalciferol (VITAMIN D3) 25 MCG (1000 UNIT) tablet  Take 1 tablet (1,000 Units total) by mouth daily.  . Multiple Vitamin (MULTI-VITAMIN PO) Take by mouth daily.  . phentermine (ADIPEX-P) 37.5 MG tablet Take 1 tablet (37.5 mg total) by mouth daily before breakfast.  . Prenatal MV & Min w/FA-DHA (ONE A DAY PRENATAL PO) Take by mouth.  . sucralfate (CARAFATE) 1 g tablet Take 1 tablet (1 g total) by mouth 4 (four) times daily -  with meals and at bedtime.  . topiramate (TOPAMAX) 50 MG tablet Take 1 tablet (50 mg total) by mouth 2 (two) times daily.   No facility-administered medications prior to visit.    Review of Systems  Constitutional: Negative.   Respiratory: Negative.   Cardiovascular: Negative.   Gastrointestinal: Positive for abdominal pain and nausea.  Genitourinary: Negative.       Objective    There were no vitals taken for this visit.   Physical Exam Constitutional:      General: She is not in acute distress.    Appearance: Normal appearance.  Pulmonary:     Effort: Pulmonary effort is normal. No respiratory distress.  Neurological:     Mental Status: She is alert and oriented to person, place, and time. Mental status is at baseline.  Psychiatric:        Mood and Affect: Mood normal.        Behavior: Behavior normal.        Assessment & Plan  1. Pregnancy with gestation of unknown duration - positive pregnancy test on 10/14 - hcg on 10/22 correlates with a gestational age of 7-8 weeks - given the use of high risk meds in the setting of unknown gestational age, patient is concerned about possible cardiac abnormalities - will get dating [place] and then patient will f/u with OB after able to establish. - return precautions discussed - [place] OP OB Limited; Future   Return if symptoms worsen or fail to improve.     I discussed the assessment and treatment plan with the patient. The patient was provided an opportunity to ask questions and all were answered. The patient agreed with the plan and demonstrated an  understanding of the instructions.   The patient was advised to call back or seek an in-person evaluation if the symptoms worsen or if the condition fails to improve as anticipated.   I, Shirlee Latch, MD, have reviewed all documentation for this visit. The documentation on 08/10/20 for the exam, diagnosis, procedures, and orders are all accurate and complete.   Jodey Burbano, Marzella Schlein, MD, MPH [facility]

## 2020-08-17 ENCOUNTER — Other Ambulatory Visit: Payer: Self-pay | Admitting: Family Medicine

## 2020-08-17 DIAGNOSIS — Z3689 Encounter for other specified antenatal screening: Secondary | ICD-10-CM

## 2020-08-17 DIAGNOSIS — Z3490 Encounter for supervision of normal pregnancy, unspecified, unspecified trimester: Secondary | ICD-10-CM

## 2020-08-18 ENCOUNTER — Ambulatory Visit
Admission: RE | Admit: 2020-08-18 | Discharge: 2020-08-18 | Disposition: A | Discharge status: Home / Self Care | Payer: Medicaid Other | Source: Ambulatory Visit | Attending: Family Medicine | Admitting: Family Medicine

## 2020-08-18 ENCOUNTER — Other Ambulatory Visit: Payer: Self-pay

## 2020-08-18 ENCOUNTER — Other Ambulatory Visit: Payer: Self-pay | Admitting: Family Medicine

## 2020-08-18 DIAGNOSIS — Z3689 Encounter for other specified antenatal screening: Secondary | ICD-10-CM | POA: Insufficient documentation

## 2020-08-18 DIAGNOSIS — Z3490 Encounter for supervision of normal pregnancy, unspecified, unspecified trimester: Secondary | ICD-10-CM

## 2020-08-19 ENCOUNTER — Telehealth: Payer: Self-pay

## 2020-08-19 NOTE — Telephone Encounter (Signed)
-----   Message from Erasmo Downer, MD sent at 08/19/2020  8:29 AM EDT ----- Normal ultrasound.  Fetus is dated at 76 weeks

## 2020-08-19 NOTE — Telephone Encounter (Signed)
Tried calling; pt's voicemail is not set up.  PEC please advise pt when she calls back.   Thanks,   -Medina Degraffenreid  

## 2020-08-19 NOTE — Telephone Encounter (Signed)
Py. Given results, verbalizes understanding.

## 2020-08-24 ENCOUNTER — Encounter: Payer: Medicaid Other | Admitting: Obstetrics & Gynecology

## 2020-08-25 ENCOUNTER — Other Ambulatory Visit (HOSPITAL_COMMUNITY)
Admission: RE | Admit: 2020-08-25 | Discharge: 2020-08-25 | Disposition: A | Discharge status: Home / Self Care | Payer: Medicaid Other | Source: Ambulatory Visit | Attending: Advanced Practice Midwife | Admitting: Advanced Practice Midwife

## 2020-08-25 ENCOUNTER — Ambulatory Visit (INDEPENDENT_AMBULATORY_CARE_PROVIDER_SITE_OTHER): Payer: Medicaid Other | Admitting: Advanced Practice Midwife

## 2020-08-25 ENCOUNTER — Other Ambulatory Visit: Payer: Self-pay

## 2020-08-25 ENCOUNTER — Encounter: Payer: Self-pay | Admitting: Advanced Practice Midwife

## 2020-08-25 VITALS — BP 108/51 | HR 118 | Wt 238.0 lb

## 2020-08-25 DIAGNOSIS — O09291 Supervision of pregnancy with other poor reproductive or obstetric history, first trimester: Secondary | ICD-10-CM | POA: Insufficient documentation

## 2020-08-25 DIAGNOSIS — Z3480 Encounter for supervision of other normal pregnancy, unspecified trimester: Secondary | ICD-10-CM | POA: Insufficient documentation

## 2020-08-25 DIAGNOSIS — Z6837 Body mass index (BMI) 37.0-37.9, adult: Secondary | ICD-10-CM

## 2020-08-25 DIAGNOSIS — Z8759 Personal history of other complications of pregnancy, childbirth and the puerperium: Secondary | ICD-10-CM | POA: Insufficient documentation

## 2020-08-25 DIAGNOSIS — O99211 Obesity complicating pregnancy, first trimester: Secondary | ICD-10-CM

## 2020-08-25 MED ORDER — ASPIRIN EC 81 MG PO TBEC: 81.0000 mg | DELAYED_RELEASE_TABLET | Freq: Every day | ORAL | 2 refills | Status: DC

## 2020-08-25 NOTE — Patient Instructions (Signed)
First Trimester of Pregnancy  The first trimester of pregnancy is from week 1 until the end of week 13 (months 1 through 3). During this time, your baby will begin to develop inside you. At 6-8 weeks, the eyes and face are formed, and the heartbeat can be seen on ultrasound. At the end of 12 weeks, all the baby's organs are formed. Prenatal care is all the medical care you receive before the birth of your baby. Make sure you get good prenatal care and follow all of your doctor's instructions. Follow these instructions at home: Medicines  Take over-the-counter and prescription medicines only as told by your doctor. Some medicines are safe and some medicines are not safe during pregnancy.  Take a prenatal vitamin that contains at least 600 micrograms (mcg) of folic acid.  If you have trouble pooping (constipation), take medicine that will make your stool soft (stool softener) if your doctor approves. Eating and drinking   Eat regular, healthy meals.  Your doctor will tell you the amount of weight gain that is right for you.  Avoid raw meat and uncooked cheese.  If you feel sick to your stomach (nauseous) or throw up (vomit): ? Eat 4 or 5 small meals a day instead of 3 large meals. ? Try eating a few soda crackers. ? Drink liquids between meals instead of during meals.  To prevent constipation: ? Eat foods that are high in fiber, like fresh fruits and vegetables, whole grains, and beans. ? Drink enough fluids to keep your pee (urine) clear or pale yellow. Activity  Exercise only as told by your doctor. Stop exercising if you have cramps or pain in your lower belly (abdomen) or low back.  Do not exercise if it is too hot, too humid, or if you are in a place of great height (high altitude).  Try to avoid standing for long periods of time. Move your legs often if you must stand in one place for a long time.  Avoid heavy lifting.  Wear low-heeled shoes. Sit and stand up  straight.  You can have sex unless your doctor tells you not to. Relieving pain and discomfort  Wear a good support bra if your breasts are sore.  Take warm water baths (sitz baths) to soothe pain or discomfort caused by hemorrhoids. Use hemorrhoid cream if your doctor says it is okay.  Rest with your legs raised if you have leg cramps or low back pain.  If you have puffy, bulging veins (varicose veins) in your legs: ? Wear support hose or compression stockings as told by your doctor. ? Raise (elevate) your feet for 15 minutes, 3-4 times a day. ? Limit salt in your food. Prenatal care  Schedule your prenatal visits by the twelfth week of pregnancy.  Write down your questions. Take them to your prenatal visits.  Keep all your prenatal visits as told by your doctor. This is important. Safety  Wear your seat belt at all times when driving.  Make a list of emergency phone numbers. The list should include numbers for family, friends, the hospital, and police and fire departments. General instructions  Ask your doctor for a referral to a local prenatal class. Begin classes no later than at the start of month 6 of your pregnancy.  Ask for help if you need counseling or if you need help with nutrition. Your doctor can give you advice or tell you where to go for help.  Do not use hot tubs, steam   rooms, or saunas.  Do not douche or use tampons or scented sanitary pads.  Do not cross your legs for long periods of time.  Avoid all herbs and alcohol. Avoid drugs that are not approved by your doctor.  Do not use any tobacco products, including cigarettes, chewing tobacco, and electronic cigarettes. If you need help quitting, ask your doctor. You may get counseling or other support to help you quit.  Avoid cat litter boxes and soil used by cats. These carry germs that can cause birth defects in the baby and can cause a loss of your baby (miscarriage) or stillbirth.  Visit your dentist.  At home, brush your teeth with a soft toothbrush. Be gentle when you floss. Contact a doctor if:  You are dizzy.  You have mild cramps or pressure in your lower belly.  You have a nagging pain in your belly area.  You continue to feel sick to your stomach, you throw up, or you have watery poop (diarrhea).  You have a bad smelling fluid coming from your vagina.  You have pain when you pee (urinate).  You have increased puffiness (swelling) in your face, hands, legs, or ankles. Get help right away if:  You have a fever.  You are leaking fluid from your vagina.  You have spotting or bleeding from your vagina.  You have very bad belly cramping or pain.  You gain or lose weight rapidly.  You throw up blood. It may look like coffee grounds.  You are around people who have German measles, fifth disease, or chickenpox.  You have a very bad headache.  You have shortness of breath.  You have any kind of trauma, such as from a fall or a car accident. Summary  The first trimester of pregnancy is from week 1 until the end of week 13 (months 1 through 3).  To take care of yourself and your unborn baby, you will need to eat healthy meals, take medicines only if your doctor tells you to do so, and do activities that are safe for you and your baby.  Keep all follow-up visits as told by your doctor. This is important as your doctor will have to ensure that your baby is healthy and growing well. This information is not intended to replace advice given to you by your health care provider. Make sure you discuss any questions you have with your health care provider. Document Revised: 01/23/2019 Document Reviewed: 10/10/2016 Elsevier Patient Education  2020 Elsevier Inc.  

## 2020-08-25 NOTE — Progress Notes (Addendum)
History:   Kimberly Farmer is a 24 y.o. [phone] at 62w0dby LMP c/w 10 week [place] seen today for her first obstetrical visit.  Her obstetrical history is significant for obesity, pre-eclampsia and cesarean section. Patient does intend to breast feed. Pregnancy history fully reviewed.  Patient reports no complaints. Unplanned pregnancy but very excited. Unsure about repeat cesarean vs TOLAC.      HISTORY: OB History  Gravida Para Term Preterm AB Living  '4 2 2 ' 0 1 2  SAB TAB Ectopic Multiple Live Births  1 0 0 0 2    # Outcome Date GA Lbr Len/2nd Weight Sex Delivery Anes PTL Lv  4 Current           3 Term 02/21/19 457w5d9 lb 8.4 oz (4.32 kg) M CS-LTranv Spinal, EPI  LIV     Name: Farmer,BOY Kimberly     Apgar1: 7  Apgar5: 9  2 Term 04/28/17 377w3d:57 / 03:53 7 lb 14.8 oz (3.595 kg) M Vag-Spont EPI  LIV     Name: Farmer,BOY Kimberly     Apgar1: 0  Apgar5: 3  1 SAB 10/16/14 [id number]       Last pap smear was done 06/21/2018 and was normal  Past Medical History:  Diagnosis Date   Arthritis    Bilateral knees and back   Breakthrough bleeding on birth control pills 05/16/2018   Cesarean wound infection 03/18/2019   Choroid plexus cysts, fetal, affecting care of mother, antepartum 09/17/2018   Lumbar disc disease    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   PCOS (polycystic ovarian syndrome)    Post-dates pregnancy 02/21/2019   Past Surgical History:  Procedure Laterality Date   CESAREAN SECTION N/A 02/21/2019   Procedure: CESAREAN SECTION;  Surgeon: HarrGae Dry;  Location: [facility] ORS;  Service: Obstetrics;  Laterality: N/A;   KNEE SURGERY Left 2011   Farmer Kimberly Din had 5 surgeries on her knee   TONSILLECTOMY AND ADENOIDECTOMY  2007   Family History  Problem Relation Age of Onset   Hypertension Mother    Alcohol abuse Father    Diabetes Maternal Grandmother    Hypertension Maternal Grandmother    Cancer Paternal Grandfather        skin   Social History    Tobacco Use   Smoking status: Never Smoker   Smokeless tobacco: Never Used   Tobacco comment: Vape - Reduced Nicotine  Vaping Use   Vaping Use: Former  Substance Use Topics   Alcohol use: No   Drug use: No   Allergies  Allergen Reactions   Hydrocodone Other (See Comments)    Other Reaction: SEVERE HA & GI Upset   Oxycodone Other (See Comments)    Other Reaction: SEVERE HA & GI Upset   Tape Rash   Current Outpatient Medications on File Prior to Visit  Medication Sig Dispense Refill   Prenatal MV & Min w/FA-DHA (ONE A DAY PRENATAL PO) Take by mouth.     b complex vitamins tablet Take 1 tablet by mouth daily. (Patient not taking: Reported on 08/25/2020) 90 tablet 1   cholecalciferol (VITAMIN D3) 25 MCG (1000 UNIT) tablet Take 1 tablet (1,000 Units total) by mouth daily. (Patient not taking: Reported on 08/25/2020) 90 tablet 1   Multiple Vitamin (MULTI-VITAMIN PO) Take by mouth daily. (Patient not taking: Reported on 08/25/2020)     phentermine (ADIPEX-P) 37.5 MG tablet Take 1 tablet (37.5 mg total) by mouth  daily before breakfast. (Patient not taking: Reported on 08/25/2020) 30 tablet 2   sucralfate (CARAFATE) 1 g tablet Take 1 tablet (1 g total) by mouth 4 (four) times daily -  with meals and at bedtime. (Patient not taking: Reported on 08/25/2020) 120 tablet 0   topiramate (TOPAMAX) 50 MG tablet Take 1 tablet (50 mg total) by mouth 2 (two) times daily. (Patient not taking: Reported on 08/25/2020) 180 tablet 1   No current facility-administered medications on file prior to visit.    Review of Systems Pertinent items noted in HPI and remainder of comprehensive ROS otherwise negative. Physical Exam:   Vitals:   08/25/20 1443  BP: (!) 108/51  Pulse: (!) 118  Weight: 238 lb (108 kg)   Fetal Heart Rate (bpm): 170  Bedside Ultrasound for FHR check: Viable intrauterine pregnancy with positive cardiac activity note, fetal heart rate 170 bpm Patient informed that  the ultrasound is considered a limited obstetric ultrasound and is not intended to be a complete ultrasound exam.  Patient also informed that the ultrasound is not being completed with the intent of assessing for fetal or placental anomalies or any pelvic abnormalities.  Explained that the purpose of todays ultrasound is to assess for fetal heart rate.  Patient acknowledges the purpose of the exam and the limitations of the study. Uterus:     System: General: well-developed, well-nourished female in no acute distress   Skin: normal coloration and turgor, no rashes   Neurologic: oriented, normal, negative, normal mood   Extremities: normal strength, tone, and muscle mass, ROM of all joints is normal   HEENT PERRLA, extraocular movement intact and sclera clear, anicteric   Mouth/Teeth mucous membranes moist, pharynx normal without lesions and dental hygiene good   Neck supple and no masses   Cardiovascular: regular rate and rhythm   Respiratory:  no respiratory distress, normal breath sounds   Abdomen: soft, non-tender; bowel sounds normal; no masses,  no organomegaly    Assessment:    Pregnancy: [id number] Patient Active Problem List   Diagnosis Date Noted   Supervision of other normal pregnancy, antepartum 08/25/2020   Hx of preeclampsia, prior pregnancy, currently pregnant, first trimester 08/25/2020   Cephalopelvic disproportion 02/21/2019   PCOS (polycystic ovarian syndrome) 03/05/2018   BMI 37.0-37.9, adult 10/11/2016   Chronic midline low back pain without sciatica 04/24/2016   Airway hyperreactivity 09/27/2015   F/H of alcoholism 09/27/2015   Bone disease 09/24/2014   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 Supervision of other normal pregnancy, antepartum - First baby with [facility] team, reintroduced to [facility] - No concerning findings today - CBC/D/Plt+RPR+Rh+ABO+Rub Ab... - Culture, OB Urine - GC/Chlamydia probe amp ([place])not at  [facility] An - Hemoglobin A1c - TSH - [place] MFM OB COMP + 14 WK; Future - Protein / creatinine ratio, urine - Comp Met (CMET) - Genetic Screening  2. Hx of preeclampsia, prior pregnancy, currently pregnant, first trimester - bASA to start after 12 weeks  3. Obesity complicating pregnancy in first trimester   4. BMI 37.0-37.9, adult    Initial labs drawn. Continue prenatal vitamins. Problem list reviewed and updated. Genetic Screening discussed, First trimester screen, Quad screen and NIPS: ordered. Ultrasound discussed; fetal anatomic survey: ordered. Anticipatory guidance about prenatal visits given including labs, ultrasounds, and testing. Discussed usage of Babyscripts and virtual visits as additional source of managing and completing prenatal visits in midst of coronavirus and pandemic.   Encouraged to  complete MyChart Registration for her ability to review results, send requests, and have questions addressed.  The nature of [place] for [facility]/Faculty Practice with multiple MDs and Advanced Practice Providers was explained to patient; also emphasized that residents, students are part of our team. Routine obstetric precautions reviewed. Encouraged to seek out care at office or emergency room [facility] preferred) for urgent and/or emergent concerns. Return in about 4 weeks (around 09/22/2020) for MD.     Mallie Snooks, MSN, CNM Certified Nurse Midwife, [facility] for [facility], [place] 08/25/20 8:52 PM

## 2020-08-26 LAB — CBC/D/PLT+RPR+RH+ABO+RUB AB...
Antibody Screen: NEGATIVE
Basophils Absolute: 0 10*3/uL (ref 0.0–0.2)
Basos: 0 %
EOS (ABSOLUTE): 0.1 10*3/uL (ref 0.0–0.4)
Eos: 1 %
HCV Ab: <0.1 s/co ratio (ref 0.0–0.9)
HIV Screen 4th Generation wRfx: NONREACTIVE
Hematocrit: 37.2 % (ref 34.0–46.6)
Hemoglobin: 12.3 g/dL (ref 11.1–15.9)
Hepatitis B Surface Ag: NEGATIVE
Immature Grans (Abs): 0 10*3/uL (ref 0.0–0.1)
Immature Granulocytes: 0 %
Lymphocytes Absolute: 2.4 10*3/uL (ref 0.7–3.1)
Lymphs: 25 %
MCH: 26.3 pg — ABNORMAL LOW (ref 26.6–33.0)
MCHC: 33.1 g/dL (ref 31.5–35.7)
MCV: 80 fL (ref 79–97)
Monocytes Absolute: 0.6 10*3/uL (ref 0.1–0.9)
Monocytes: 6 %
Neutrophils Absolute: 6.7 10*3/uL (ref 1.4–7.0)
Neutrophils: 68 %
Platelets: 294 10*3/uL (ref 150–450)
RBC: 4.67 x10E6/uL (ref 3.77–5.28)
RDW: 14.6 % (ref 11.7–15.4)
RPR Ser Ql: NONREACTIVE
Rh Factor: POSITIVE
Rubella Antibodies, IGG: 4.87 index (ref >0.99)
WBC: 9.9 10*3/uL (ref 3.4–10.8)

## 2020-08-26 LAB — COMPREHENSIVE METABOLIC PANEL
ALT: 8 IU/L (ref 0–32)
AST: 11 IU/L (ref 0–40)
Albumin/Globulin Ratio: 1.6 (ref 1.2–2.2)
Albumin: 4.1 g/dL (ref 3.9–5.0)
Alkaline Phosphatase: 89 IU/L (ref 44–121)
BUN/Creatinine Ratio: 15 (ref 9–23)
BUN: 7 mg/dL (ref 6–20)
Bilirubin Total: <0.2 mg/dL (ref 0.0–1.2)
CO2: 21 mmol/L (ref 20–29)
Calcium: 9.3 mg/dL (ref 8.7–10.2)
Chloride: 100 mmol/L (ref 96–106)
Creatinine, Ser: 0.46 mg/dL — ABNORMAL LOW (ref 0.57–1.00)
GFR calc Af Amer: 161 mL/min/{1.73_m2} (ref >59)
GFR calc non Af Amer: 140 mL/min/{1.73_m2} (ref >59)
Globulin, Total: 2.6 g/dL (ref 1.5–4.5)
Glucose: 85 mg/dL (ref 65–99)
Potassium: 4.1 mmol/L (ref 3.5–5.2)
Sodium: 133 mmol/L — ABNORMAL LOW (ref 134–144)
Total Protein: 6.7 g/dL (ref 6.0–8.5)

## 2020-08-26 LAB — PROTEIN / CREATININE RATIO, URINE
Creatinine, Urine: 68.8 mg/dL
Protein, Ur: 7.7 mg/dL
Protein/Creat Ratio: 112 mg/g creat (ref 0–200)

## 2020-08-26 LAB — HEMOGLOBIN A1C
Est. average glucose Bld gHb Est-mCnc: 103 mg/dL
Hgb A1c MFr Bld: 5.2 % (ref 4.8–5.6)

## 2020-08-26 LAB — TSH: TSH: 0.883 u[IU]/mL (ref 0.450–4.500)

## 2020-08-26 LAB — HCV INTERPRETATION

## 2020-08-27 LAB — GC/CHLAMYDIA PROBE AMP (~~LOC~~) NOT AT ARMC
Chlamydia: NEGATIVE
Comment: NEGATIVE
Comment: NORMAL
Neisseria Gonorrhea: NEGATIVE

## 2020-08-27 LAB — URINE CULTURE, OB REFLEX

## 2020-08-27 LAB — CULTURE, OB URINE

## 2020-08-31 ENCOUNTER — Telehealth: Payer: Self-pay | Admitting: Radiology

## 2020-08-31 ENCOUNTER — Encounter: Payer: Self-pay | Admitting: Radiology

## 2020-08-31 NOTE — Telephone Encounter (Signed)
Patient was informed of Panorama and fetal sex results  

## 2020-09-21 ENCOUNTER — Other Ambulatory Visit: Payer: Self-pay

## 2020-09-21 ENCOUNTER — Ambulatory Visit (INDEPENDENT_AMBULATORY_CARE_PROVIDER_SITE_OTHER): Payer: Medicaid Other | Admitting: Family Medicine

## 2020-09-21 DIAGNOSIS — O34219 Maternal care for unspecified type scar from previous cesarean delivery: Secondary | ICD-10-CM | POA: Insufficient documentation

## 2020-09-21 DIAGNOSIS — Z3480 Encounter for supervision of other normal pregnancy, unspecified trimester: Secondary | ICD-10-CM

## 2020-09-21 NOTE — Patient Instructions (Signed)

## 2020-09-21 NOTE — Progress Notes (Signed)
   PRENATAL VISIT NOTE  Subjective:  Kimberly Farmer is a 24 y.o. [phone] at [redacted]w[redacted]d being seen today for ongoing prenatal care.  She is currently monitored for the following issues for this low-risk pregnancy and has Airway hyperreactivity; F/H of alcoholism; Osteochondritis dissecans of bilateral knees; Bone disease; Chronic midline low back pain without sciatica; BMI 37.0-37.9, adult; PCOS (polycystic ovarian syndrome); Cephalopelvic disproportion; Supervision of other normal pregnancy, antepartum; Hx of preeclampsia, prior pregnancy, currently pregnant, first trimester; and Previous cesarean delivery affecting pregnancy, antepartum on their problem list.  Patient reports no complaints.  Contractions: Not present. Vag. Bleeding: None.   . Denies leaking of fluid.   The following portions of the patient's history were reviewed and updated as appropriate: allergies, current medications, past family history, past medical history, past social history, past surgical history and problem list.   Objective:   Vitals:   09/21/20 1340  BP: 136/82  Pulse: 93  Weight: 238 lb 9.6 oz (108.2 kg)    Fetal Status: Fetal Heart Rate (bpm): 154         General:  Alert, oriented and cooperative. Patient is in no acute distress.  Skin: Skin is warm and dry. No rash noted.   Cardiovascular: Normal heart rate noted  Respiratory: Normal respiratory effort, no problems with respiration noted  Abdomen: Soft, gravid, appropriate for gestational age.  Pain/Pressure: Absent     Pelvic: Cervical exam deferred        Extremities: Normal range of motion.     Mental Status: Normal mood and affect. Normal behavior. Normal judgment and thought content.   Assessment and Plan:  Pregnancy: [id number] at [redacted]w[redacted]d 1. Supervision of other normal pregnancy, antepartum Continue routine prenatal care. Anatomy u/s scheduled  2. Previous cesarean delivery affecting pregnancy, antepartum Unsure about method of  delivery--considering TOLAC  General obstetric precautions including but not limited to vaginal bleeding, contractions, leaking of fluid and fetal movement were reviewed in detail with the patient. Please refer to After Visit Summary for other counseling recommendations.   Return in 4 weeks (on 10/19/2020).  Future Appointments  Date Time Provider Department Center  10/27/2020  9:45 AM [facility] US[facility]    Reva Bores, MD

## 2020-09-24 ENCOUNTER — Telehealth (INDEPENDENT_AMBULATORY_CARE_PROVIDER_SITE_OTHER): Payer: 59 | Admitting: Physician Assistant

## 2020-09-24 DIAGNOSIS — Z5329 Procedure and treatment not carried out because of patient's decision for other reasons: Secondary | ICD-10-CM

## 2020-09-24 DIAGNOSIS — Z91199 Patient's noncompliance with other medical treatment and regimen due to unspecified reason: Secondary | ICD-10-CM

## 2020-09-24 NOTE — Progress Notes (Signed)
Called patient x 3 and waited for 10 minutes on mychart. Patient NOS mychart video visit.

## 2020-10-19 ENCOUNTER — Other Ambulatory Visit: Payer: Self-pay

## 2020-10-19 ENCOUNTER — Ambulatory Visit (INDEPENDENT_AMBULATORY_CARE_PROVIDER_SITE_OTHER): Payer: Medicaid Other | Admitting: Family Medicine

## 2020-10-19 VITALS — BP 128/88 | HR 111 | Wt 242.0 lb

## 2020-10-19 DIAGNOSIS — O34219 Maternal care for unspecified type scar from previous cesarean delivery: Secondary | ICD-10-CM

## 2020-10-19 DIAGNOSIS — Z3480 Encounter for supervision of other normal pregnancy, unspecified trimester: Secondary | ICD-10-CM

## 2020-10-19 NOTE — Progress Notes (Signed)
   PRENATAL VISIT NOTE  Subjective:  Kimberly Farmer is a 25 y.o. [phone] at [redacted]w[redacted]d being seen today for ongoing prenatal care.  She is currently monitored for the following issues for this low-risk pregnancy and has Airway hyperreactivity; F/H of alcoholism; Osteochondritis dissecans of bilateral knees; Bone disease; Chronic midline low back pain without sciatica; BMI 37.0-37.9, adult; PCOS (polycystic ovarian syndrome); Supervision of other normal pregnancy, antepartum; Hx of preeclampsia, prior pregnancy, currently pregnant, first trimester; and Previous cesarean delivery affecting pregnancy, antepartum on their problem list.  Patient reports no complaints.  Contractions: Not present. Vag. Bleeding: None.   . Denies leaking of fluid.   The following portions of the patient's history were reviewed and updated as appropriate: allergies, current medications, past family history, past medical history, past social history, past surgical history and problem list.   Objective:   Vitals:   10/19/20 1427  BP: 128/88  Pulse: (!) 111  Weight: 242 lb (109.8 kg)    Fetal Status: Fetal Heart Rate (bpm): 158         General:  Alert, oriented and cooperative. Patient is in no acute distress.  Skin: Skin is warm and dry. No rash noted.   Cardiovascular: Normal heart rate noted  Respiratory: Normal respiratory effort, no problems with respiration noted  Abdomen: Soft, gravid, appropriate for gestational age.  Pain/Pressure: Absent     Pelvic: Cervical exam deferred        Extremities: Normal range of motion.     Mental Status: Normal mood and affect. Normal behavior. Normal judgment and thought content.   Assessment and Plan:  Pregnancy: [id number] at [redacted]w[redacted]d 1. Supervision of other normal pregnancy, antepartum Continue routine prenatal care. Anatomy u/s scheduled 10/27/20  2. Previous cesarean delivery affecting pregnancy, antepartum Trying to limit weight gain Avoid concentrated  sweets.  General obstetric precautions including but not limited to vaginal bleeding, contractions, leaking of fluid and fetal movement were reviewed in detail with the patient. Please refer to After Visit Summary for other counseling recommendations.   Return in 4 weeks (on 11/16/2020).  Future Appointments  Date Time Provider Department Center  10/27/2020  9:45 AM [facility]  11/17/2020 10:10 AM Calvert Cantor, CNM [facility]    Kimberly Bores, MD

## 2020-10-19 NOTE — Patient Instructions (Signed)

## 2020-10-27 ENCOUNTER — Ambulatory Visit: Payer: Medicaid Other | Attending: Advanced Practice Midwife

## 2020-10-27 ENCOUNTER — Other Ambulatory Visit: Payer: Self-pay

## 2020-10-27 ENCOUNTER — Other Ambulatory Visit: Payer: Self-pay | Admitting: *Deleted

## 2020-10-27 ENCOUNTER — Other Ambulatory Visit: Payer: Self-pay | Admitting: Advanced Practice Midwife

## 2020-10-27 DIAGNOSIS — Z3480 Encounter for supervision of other normal pregnancy, unspecified trimester: Secondary | ICD-10-CM | POA: Diagnosis present

## 2020-10-27 DIAGNOSIS — O3503X0 Maternal care for (suspected) central nervous system malformation or damage in fetus, choroid plexus cysts, not applicable or unspecified: Secondary | ICD-10-CM

## 2020-10-27 DIAGNOSIS — O350XX0 Maternal care for (suspected) central nervous system malformation in fetus, not applicable or unspecified: Secondary | ICD-10-CM

## 2020-11-17 ENCOUNTER — Telehealth: Payer: Self-pay | Admitting: *Deleted

## 2020-11-17 ENCOUNTER — Encounter: Payer: Medicaid Other | Admitting: Advanced Practice Midwife

## 2020-11-17 ENCOUNTER — Ambulatory Visit (INDEPENDENT_AMBULATORY_CARE_PROVIDER_SITE_OTHER): Payer: Medicaid Other | Admitting: *Deleted

## 2020-11-17 ENCOUNTER — Ambulatory Visit: Payer: Medicaid Other

## 2020-11-17 ENCOUNTER — Other Ambulatory Visit: Payer: Self-pay

## 2020-11-17 DIAGNOSIS — O34219 Maternal care for unspecified type scar from previous cesarean delivery: Secondary | ICD-10-CM

## 2020-11-17 NOTE — Progress Notes (Signed)
Pt here to do fetal heart rate check due to not feeling baby move. Pt does have anterior placenta.   FHR in office 147 via doppler.   Pt to follow up next week at [facility] visit.

## 2020-11-17 NOTE — Telephone Encounter (Signed)
Pt called stating she hadn't felt her baby move since Monday night. Instructed pt to drink something cold and surgery and to lay in a quiet space and see if she felt baby move. If not after around 30 minutes to come straight to the office for a fetal HR check. Pt verbalizes and understands.

## 2020-11-18 NOTE — Progress Notes (Signed)
Patient was assessed and managed by nursing staff during this encounter. I have reviewed the chart and agree with the documentation and plan. I have also made any necessary editorial changes.  Jaynie Collins, MD 11/18/2020 7:59 AM

## 2020-11-24 ENCOUNTER — Ambulatory Visit: Payer: Medicaid Other

## 2020-11-25 ENCOUNTER — Ambulatory Visit (INDEPENDENT_AMBULATORY_CARE_PROVIDER_SITE_OTHER): Payer: Medicaid Other | Admitting: Obstetrics and Gynecology

## 2020-11-25 ENCOUNTER — Other Ambulatory Visit: Payer: Self-pay

## 2020-11-25 VITALS — BP 123/81 | HR 110 | Wt 247.4 lb

## 2020-11-25 DIAGNOSIS — O350XX0 Maternal care for (suspected) central nervous system malformation in fetus, not applicable or unspecified: Secondary | ICD-10-CM

## 2020-11-25 DIAGNOSIS — O99210 Obesity complicating pregnancy, unspecified trimester: Secondary | ICD-10-CM

## 2020-11-25 DIAGNOSIS — O3503X0 Maternal care for (suspected) central nervous system malformation or damage in fetus, choroid plexus cysts, not applicable or unspecified: Secondary | ICD-10-CM

## 2020-11-25 DIAGNOSIS — Z3480 Encounter for supervision of other normal pregnancy, unspecified trimester: Secondary | ICD-10-CM

## 2020-11-25 DIAGNOSIS — Z3A23 23 weeks gestation of pregnancy: Secondary | ICD-10-CM

## 2020-11-25 DIAGNOSIS — O09291 Supervision of pregnancy with other poor reproductive or obstetric history, first trimester: Secondary | ICD-10-CM

## 2020-11-25 DIAGNOSIS — O34219 Maternal care for unspecified type scar from previous cesarean delivery: Secondary | ICD-10-CM

## 2020-11-25 DIAGNOSIS — Z6841 Body Mass Index (BMI) 40.0 and over, adult: Secondary | ICD-10-CM

## 2020-11-25 NOTE — Progress Notes (Signed)
Prenatal Visit Note Date: 11/25/2020 Clinic: [facility]  Subjective:  Kimberly Farmer is a 25 y.o. [id number] at [redacted]w[redacted]d being seen today for ongoing prenatal care.  She is currently monitored for the following issues for this high-risk pregnancy and has Airway hyperreactivity; F/H of alcoholism; Osteochondritis dissecans of bilateral knees; Bone disease; Chronic midline low back pain without sciatica; BMI 37.0-37.9, adult; PCOS (polycystic ovarian syndrome); Choroid plexus cysts, fetal, affecting care of mother, antepartum; Supervision of other normal pregnancy, antepartum; Hx of preeclampsia, prior pregnancy, currently pregnant, first trimester; and Previous cesarean delivery affecting pregnancy, antepartum on their problem list.  Patient reports no complaints.   Contractions: Not present. Vag. Bleeding: None.  Movement: Present. Denies leaking of fluid.   The following portions of the patient's history were reviewed and updated as appropriate: allergies, current medications, past family history, past medical history, past social history, past surgical history and problem list. Problem list updated.  Objective:   Vitals:   11/25/20 0950  BP: 123/81  Pulse: (!) 110  Weight: 247 lb 6.4 oz (112.2 kg)    Fetal Status: Fetal Heart Rate (bpm): 130   Movement: Present     General:  Alert, oriented and cooperative. Patient is in no acute distress.  Skin: Skin is warm and dry. No rash noted.   Cardiovascular: Normal heart rate noted  Respiratory: Normal respiratory effort, no problems with respiration noted  Abdomen: Soft, gravid, appropriate for gestational age. Pain/Pressure: Absent     Pelvic:  Cervical exam deferred        Extremities: Normal range of motion.     Mental Status: Normal mood and affect. Normal behavior. Normal judgment and thought content.   Urinalysis:      Assessment and Plan:  Pregnancy: [id number] at [redacted]w[redacted]d  1. Previous cesarean delivery affecting  pregnancy, antepartum Patient desires repeat. Can schedule this at later date  2. Hx of preeclampsia, prior pregnancy, currently pregnant, first trimester Continue low dose asa  3. Supervision of other normal pregnancy, antepartum 28wk labs nv  4. [redacted] weeks gestation of pregnancy  5. BMI 40.0-44.9, adult (HCC)  6. Obesity in pregnancy  7. Choroid plexus cyst of fetus affecting care of mother, antepartum, single or unspecified fetus Low risk cffdna Has rpt u/s tomorrow  Preterm labor symptoms and general obstetric precautions including but not limited to vaginal bleeding, contractions, leaking of fluid and fetal movement were reviewed in detail with the patient. Please refer to After Visit Summary for other counseling recommendations.  Return in about 4 weeks (around 12/23/2020) for in person, 2hr GTT, md or app.   [place]iss[place] Bing, MD

## 2020-12-09 ENCOUNTER — Ambulatory Visit: Payer: Medicaid Other | Admitting: *Deleted

## 2020-12-09 ENCOUNTER — Encounter: Payer: Self-pay | Admitting: *Deleted

## 2020-12-09 ENCOUNTER — Other Ambulatory Visit: Payer: Self-pay

## 2020-12-09 ENCOUNTER — Ambulatory Visit: Payer: Medicaid Other | Attending: Maternal & Fetal Medicine

## 2020-12-09 DIAGNOSIS — E669 Obesity, unspecified: Secondary | ICD-10-CM

## 2020-12-09 DIAGNOSIS — O350XX0 Maternal care for (suspected) central nervous system malformation in fetus, not applicable or unspecified: Secondary | ICD-10-CM | POA: Diagnosis not present

## 2020-12-09 DIAGNOSIS — O99212 Obesity complicating pregnancy, second trimester: Secondary | ICD-10-CM | POA: Diagnosis not present

## 2020-12-09 DIAGNOSIS — O34219 Maternal care for unspecified type scar from previous cesarean delivery: Secondary | ICD-10-CM | POA: Diagnosis not present

## 2020-12-09 DIAGNOSIS — O3503X0 Maternal care for (suspected) central nervous system malformation or damage in fetus, choroid plexus cysts, not applicable or unspecified: Secondary | ICD-10-CM

## 2020-12-09 DIAGNOSIS — O322XX0 Maternal care for transverse and oblique lie, not applicable or unspecified: Secondary | ICD-10-CM

## 2020-12-09 DIAGNOSIS — O352XX0 Maternal care for (suspected) hereditary disease in fetus, not applicable or unspecified: Secondary | ICD-10-CM | POA: Diagnosis not present

## 2020-12-09 DIAGNOSIS — O358XX0 Maternal care for other (suspected) fetal abnormality and damage, not applicable or unspecified: Secondary | ICD-10-CM

## 2020-12-09 DIAGNOSIS — Z362 Encounter for other antenatal screening follow-up: Secondary | ICD-10-CM

## 2020-12-09 DIAGNOSIS — Z3A25 25 weeks gestation of pregnancy: Secondary | ICD-10-CM

## 2020-12-09 DIAGNOSIS — O09292 Supervision of pregnancy with other poor reproductive or obstetric history, second trimester: Secondary | ICD-10-CM

## 2020-12-10 ENCOUNTER — Other Ambulatory Visit: Payer: Self-pay | Admitting: *Deleted

## 2020-12-10 DIAGNOSIS — O3503X0 Maternal care for (suspected) central nervous system malformation or damage in fetus, choroid plexus cysts, not applicable or unspecified: Secondary | ICD-10-CM

## 2020-12-10 DIAGNOSIS — O350XX0 Maternal care for (suspected) central nervous system malformation in fetus, not applicable or unspecified: Secondary | ICD-10-CM

## 2020-12-21 ENCOUNTER — Encounter: Payer: Medicaid Other | Admitting: Obstetrics and Gynecology

## 2020-12-21 ENCOUNTER — Encounter: Payer: Self-pay | Admitting: Obstetrics and Gynecology

## 2020-12-21 NOTE — Progress Notes (Signed)
Patient did not keep her OB appointment for 12/21/2020.  Cornelia Copa MD Attending [facility] [employer])

## 2020-12-22 ENCOUNTER — Encounter: Payer: Self-pay | Admitting: Family Medicine

## 2020-12-22 ENCOUNTER — Telehealth (INDEPENDENT_AMBULATORY_CARE_PROVIDER_SITE_OTHER): Payer: Medicaid Other | Admitting: Family Medicine

## 2020-12-22 DIAGNOSIS — O09291 Supervision of pregnancy with other poor reproductive or obstetric history, first trimester: Secondary | ICD-10-CM

## 2020-12-22 DIAGNOSIS — O34219 Maternal care for unspecified type scar from previous cesarean delivery: Secondary | ICD-10-CM | POA: Diagnosis not present

## 2020-12-22 DIAGNOSIS — Z3480 Encounter for supervision of other normal pregnancy, unspecified trimester: Secondary | ICD-10-CM

## 2020-12-22 DIAGNOSIS — O358XX0 Maternal care for other (suspected) fetal abnormality and damage, not applicable or unspecified: Secondary | ICD-10-CM | POA: Diagnosis not present

## 2020-12-22 DIAGNOSIS — Z8759 Personal history of other complications of pregnancy, childbirth and the puerperium: Secondary | ICD-10-CM

## 2020-12-22 DIAGNOSIS — Z3A27 27 weeks gestation of pregnancy: Secondary | ICD-10-CM

## 2020-12-22 DIAGNOSIS — O09292 Supervision of pregnancy with other poor reproductive or obstetric history, second trimester: Secondary | ICD-10-CM | POA: Diagnosis not present

## 2020-12-22 NOTE — Progress Notes (Signed)
I connected with Kimberly Farmer 12/22/20 at  2:00 PM EST by: MyChart video and verified that I am speaking with the correct person using two identifiers.  Patient is located at home and provider is located at [facility].     The purpose of this virtual visit is to provide medical care while limiting exposure to the novel coronavirus. I discussed the limitations, risks, security and privacy concerns of performing an evaluation and management service by MyChart video and the availability of in person appointments. I also discussed with the patient that there may be a patient responsible charge related to this service. By engaging in this virtual visit, you consent to the provision of healthcare.  Additionally, you authorize for your insurance to be billed for the services provided during this visit.  The patient expressed understanding and agreed to proceed.  The following staff members participated in the virtual visit:  Philemon Kingdom     PRENATAL VISIT NOTE  Subjective:  Kimberly Farmer is a 25 y.o. [id number] at [redacted]w[redacted]d  for phone visit for ongoing prenatal care.  She is currently monitored for the following issues for this high-risk pregnancy and has Airway hyperreactivity; F/H of alcoholism; Osteochondritis dissecans of bilateral knees; Bone disease; Chronic midline low back pain without sciatica; BMI 37.0-37.9, adult; PCOS (polycystic ovarian syndrome); Choroid plexus cysts, fetal, affecting care of mother, antepartum; Supervision of other normal pregnancy, antepartum; Hx of preeclampsia, prior pregnancy, currently pregnant, first trimester; and Previous cesarean delivery affecting pregnancy, antepartum on their problem list.  Patient reports no complaints.  Right hip is locking up while walking- used to happen with hip.  Contractions: Not present. Vag. Bleeding: None.  Movement: Present. Denies leaking of fluid.   The following portions of the patient's history were reviewed and updated as appropriate:  allergies, current medications, past family history, past medical history, past social history, past surgical history and problem list.   Objective:  There were no vitals filed for this visit. Self-Obtained  Fetal Status:     Movement: Present     Assessment and Plan:  Pregnancy: [id number] at [redacted]w[redacted]d  1. Previous cesarean delivery affecting pregnancy, antepartum Desires RCS @39  Sent message to to schedule  2. Supervision of other normal pregnancy, antepartum Up to date Needs BP cuff  If going to do virtual visits   3. Hx of preeclampsia, prior pregnancy, currently pregnant, first trimester NO BP today  Preterm labor symptoms and general obstetric precautions including but not limited to vaginal bleeding, contractions, leaking of fluid and fetal movement were reviewed in detail with the patient.  Return in about 3 weeks (around 01/12/2021) for Routine prenatal care, Scheduled prenatal/NST.  Future Appointments  Date Time Provider Department Center  01/07/2021  3:30 PM [facility] NURSE [facility]  01/07/2021  3:45 PM [facility] US5 [facility]  01/10/2021  8:30 AM [facility] LAB [facility]  01/12/2021  9:30 AM [facility], 01/14/2021, CNM [facility]     Time spent on virtual visit: 11 minutes  Charlesetta Garibaldi, MD

## 2020-12-22 NOTE — Progress Notes (Signed)
Pt does not have bp machine

## 2020-12-25 ENCOUNTER — Other Ambulatory Visit: Payer: Self-pay | Admitting: Obstetrics and Gynecology

## 2021-01-05 ENCOUNTER — Telehealth: Payer: Medicaid Other | Admitting: Family Medicine

## 2021-01-07 ENCOUNTER — Ambulatory Visit: Payer: Medicaid Other | Admitting: *Deleted

## 2021-01-07 ENCOUNTER — Other Ambulatory Visit: Payer: Self-pay

## 2021-01-07 ENCOUNTER — Encounter: Payer: Self-pay | Admitting: *Deleted

## 2021-01-07 ENCOUNTER — Ambulatory Visit: Payer: Medicaid Other | Attending: Obstetrics

## 2021-01-07 DIAGNOSIS — O350XX0 Maternal care for (suspected) central nervous system malformation in fetus, not applicable or unspecified: Secondary | ICD-10-CM | POA: Diagnosis present

## 2021-01-07 DIAGNOSIS — O34219 Maternal care for unspecified type scar from previous cesarean delivery: Secondary | ICD-10-CM

## 2021-01-07 DIAGNOSIS — E669 Obesity, unspecified: Secondary | ICD-10-CM

## 2021-01-07 DIAGNOSIS — O99213 Obesity complicating pregnancy, third trimester: Secondary | ICD-10-CM | POA: Diagnosis not present

## 2021-01-07 DIAGNOSIS — Z3A29 29 weeks gestation of pregnancy: Secondary | ICD-10-CM

## 2021-01-07 DIAGNOSIS — O3503X0 Maternal care for (suspected) central nervous system malformation or damage in fetus, choroid plexus cysts, not applicable or unspecified: Secondary | ICD-10-CM

## 2021-01-07 DIAGNOSIS — Z8759 Personal history of other complications of pregnancy, childbirth and the puerperium: Secondary | ICD-10-CM

## 2021-01-10 ENCOUNTER — Other Ambulatory Visit: Payer: Self-pay | Admitting: *Deleted

## 2021-01-10 ENCOUNTER — Other Ambulatory Visit: Payer: Medicaid Other

## 2021-01-10 DIAGNOSIS — O3663X0 Maternal care for excessive fetal growth, third trimester, not applicable or unspecified: Secondary | ICD-10-CM

## 2021-01-12 ENCOUNTER — Telehealth: Payer: Medicaid Other | Admitting: Student

## 2021-01-13 ENCOUNTER — Telehealth: Payer: Self-pay | Admitting: Radiology

## 2021-01-13 NOTE — Telephone Encounter (Signed)
Tried to call to rescheduled missed appointment. Unable to leave voicemail (not set up)

## 2021-01-18 ENCOUNTER — Telehealth: Payer: Self-pay | Admitting: Radiology

## 2021-01-18 NOTE — Telephone Encounter (Signed)
Unable to leave voicemail to reschedule missed appointment.

## 2021-01-24 ENCOUNTER — Encounter: Payer: Medicaid Other | Admitting: Family Medicine

## 2021-01-25 ENCOUNTER — Other Ambulatory Visit: Payer: Self-pay

## 2021-01-25 ENCOUNTER — Telehealth (INDEPENDENT_AMBULATORY_CARE_PROVIDER_SITE_OTHER): Payer: Medicaid Other | Admitting: Obstetrics and Gynecology

## 2021-01-25 DIAGNOSIS — O34219 Maternal care for unspecified type scar from previous cesarean delivery: Secondary | ICD-10-CM | POA: Diagnosis not present

## 2021-01-25 DIAGNOSIS — O350XX0 Maternal care for (suspected) central nervous system malformation in fetus, not applicable or unspecified: Secondary | ICD-10-CM

## 2021-01-25 DIAGNOSIS — O99213 Obesity complicating pregnancy, third trimester: Secondary | ICD-10-CM

## 2021-01-25 DIAGNOSIS — O3503X0 Maternal care for (suspected) central nervous system malformation or damage in fetus, choroid plexus cysts, not applicable or unspecified: Secondary | ICD-10-CM

## 2021-01-25 DIAGNOSIS — O133 Gestational [pregnancy-induced] hypertension without significant proteinuria, third trimester: Secondary | ICD-10-CM | POA: Insufficient documentation

## 2021-01-25 DIAGNOSIS — O3663X0 Maternal care for excessive fetal growth, third trimester, not applicable or unspecified: Secondary | ICD-10-CM | POA: Insufficient documentation

## 2021-01-25 DIAGNOSIS — E669 Obesity, unspecified: Secondary | ICD-10-CM

## 2021-01-25 DIAGNOSIS — Z3A31 31 weeks gestation of pregnancy: Secondary | ICD-10-CM

## 2021-01-25 DIAGNOSIS — O99210 Obesity complicating pregnancy, unspecified trimester: Secondary | ICD-10-CM

## 2021-01-25 DIAGNOSIS — Z3480 Encounter for supervision of other normal pregnancy, unspecified trimester: Secondary | ICD-10-CM

## 2021-01-25 DIAGNOSIS — Z6837 Body mass index (BMI) 37.0-37.9, adult: Secondary | ICD-10-CM

## 2021-01-25 NOTE — Progress Notes (Signed)
Pt unable to take blood pressure

## 2021-01-25 NOTE — Progress Notes (Signed)
TELEHEALTH OBSTETRICS VISIT ENCOUNTER NOTE  Provider location: [facility]   Patient location: Home  I connected with Kimberly Farmer on 01/25/21 at 11:15 AM EDT by telephone at home and verified that I am speaking with the correct person using two identifiers. Of note, unable to do video encounter due to technical difficulties.    I discussed the limitations, risks, security and privacy concerns of performing an evaluation and management service by telephone and the availability of in person appointments. I also discussed with the patient that there may be a patient responsible charge related to this service. The patient expressed understanding and agreed to proceed.  Subjective:  Kimberly Farmer is a 25 y.o. [phone] at [redacted]w[redacted]d being followed for ongoing prenatal care.  She is currently monitored for the following issues for this high-risk pregnancy and has Airway hyperreactivity; F/H of alcoholism; Osteochondritis dissecans of bilateral knees; Bone disease; Chronic midline low back pain without sciatica; BMI 37.0-37.9, adult; PCOS (polycystic ovarian syndrome); Choroid plexus cysts, fetal, affecting care of mother, antepartum; Supervision of other normal pregnancy, antepartum; Hx of preeclampsia, prior pregnancy, currently pregnant, first trimester; Previous cesarean delivery affecting pregnancy, antepartum; Lactating mother; Transient hypertension of pregnancy in third trimester; and Excessive fetal growth affecting management of mother in third trimester, antepartum on their problem list.  Patient reports no complaints. Reports fetal movement. Denies any contractions, bleeding or leaking of fluid.   The following portions of the patient's history were reviewed and updated as appropriate: allergies, current medications, past family history, past medical history, past social history, past surgical history and problem list.   Objective:   General:  Alert,  oriented and cooperative.   Mental Status: Normal mood and affect perceived. Normal judgment and thought content.  Rest of physical exam deferred due to type of encounter  Assessment and Plan:  Pregnancy: [id number] at [redacted]w[redacted]d 1. Previous cesarean delivery affecting pregnancy, antepartum Rpt on 6/1. Pt states partner is getting a vasectomy  2. Excessive fetal growth affecting management of pregnancy in third trimester, single or unspecified fetus On 3/25, 93%, 1703gm, AC 89% and borderline poly: afi 24, mvp 9 She has not had her GTT screening and she states that she doesn't believe she can do a two hour test. I told her that we can do a 1h test and she will see if she can do this or not. If she can't, we can try and give her a CBG meter and see about her checking her sugars at home  See below  3. Transient hypertension of pregnancy in third trimester Pt was 129/94 at her MFM u/s appt. She missed her appointment yesterday because she has two special needs children and has no one to watch her kids. She did not take her BP today. I told her the importance of following up this BP and risk of HTN in pregnancy and for her to take it at home or come by the office to have [place] check it  4. Supervision of high risk pregnancy, antepartum  5. BMI 37.0-37.9, adult  6. Obesity in pregnancy  7. Choroid plexus cyst of fetus affecting care of mother, antepartum, single or unspecified fetus Stable. Low risk panorama  Preterm labor symptoms and general obstetric precautions including but not limited to vaginal bleeding, contractions, leaking of fluid and fetal movement were reviewed in detail with the patient.  I discussed the assessment and treatment plan with the patient. The patient was provided an  opportunity to ask questions and all were answered. The patient agreed with the plan and demonstrated an understanding of the instructions. The patient was advised to call back or seek an in-person office  evaluation/go to [facility] at [facility] for any urgent or concerning symptoms. Please refer to After Visit Summary for other counseling recommendations.   I provided 10 minutes of non-face-to-face time during this encounter.  No follow-ups on file.  Future Appointments  Date Time Provider Department Center  02/04/2021  2:30 PM [facility] NURSE [facility]  02/04/2021  2:45 PM [facility] US4 [facility]  02/09/2021  1:10 PM Kooistra, Charlesetta Garibaldi, CNM [facility]    [place] Bing, MD Center for [facility]

## 2021-02-01 ENCOUNTER — Inpatient Hospital Stay (HOSPITAL_COMMUNITY)
Admission: AD | Admit: 2021-02-01 | Discharge: 2021-02-01 | Disposition: A | Discharge status: Home / Self Care | Payer: Medicaid Other | Attending: Obstetrics and Gynecology | Admitting: Obstetrics and Gynecology

## 2021-02-01 ENCOUNTER — Inpatient Hospital Stay (HOSPITAL_BASED_OUTPATIENT_CLINIC_OR_DEPARTMENT_OTHER): Payer: Medicaid Other

## 2021-02-01 ENCOUNTER — Encounter (HOSPITAL_COMMUNITY): Payer: Self-pay | Admitting: Obstetrics and Gynecology

## 2021-02-01 ENCOUNTER — Other Ambulatory Visit: Payer: Self-pay

## 2021-02-01 DIAGNOSIS — R109 Unspecified abdominal pain: Secondary | ICD-10-CM | POA: Insufficient documentation

## 2021-02-01 DIAGNOSIS — M545 Low back pain, unspecified: Secondary | ICD-10-CM | POA: Diagnosis not present

## 2021-02-01 DIAGNOSIS — R102 Pelvic and perineal pain: Secondary | ICD-10-CM | POA: Diagnosis not present

## 2021-02-01 DIAGNOSIS — E669 Obesity, unspecified: Secondary | ICD-10-CM | POA: Diagnosis not present

## 2021-02-01 DIAGNOSIS — O368130 Decreased fetal movements, third trimester, not applicable or unspecified: Secondary | ICD-10-CM | POA: Diagnosis not present

## 2021-02-01 DIAGNOSIS — O403XX0 Polyhydramnios, third trimester, not applicable or unspecified: Secondary | ICD-10-CM | POA: Diagnosis not present

## 2021-02-01 DIAGNOSIS — Z3689 Encounter for other specified antenatal screening: Secondary | ICD-10-CM | POA: Diagnosis not present

## 2021-02-01 DIAGNOSIS — Z3A32 32 weeks gestation of pregnancy: Secondary | ICD-10-CM | POA: Diagnosis not present

## 2021-02-01 DIAGNOSIS — O26893 Other specified pregnancy related conditions, third trimester: Secondary | ICD-10-CM

## 2021-02-01 DIAGNOSIS — O99213 Obesity complicating pregnancy, third trimester: Secondary | ICD-10-CM | POA: Insufficient documentation

## 2021-02-01 DIAGNOSIS — M549 Dorsalgia, unspecified: Secondary | ICD-10-CM | POA: Diagnosis not present

## 2021-02-01 DIAGNOSIS — O368131 Decreased fetal movements, third trimester, fetus 1: Secondary | ICD-10-CM | POA: Diagnosis not present

## 2021-02-01 DIAGNOSIS — O09293 Supervision of pregnancy with other poor reproductive or obstetric history, third trimester: Secondary | ICD-10-CM | POA: Diagnosis not present

## 2021-02-01 DIAGNOSIS — Z79899 Other long term (current) drug therapy: Secondary | ICD-10-CM | POA: Insufficient documentation

## 2021-02-01 DIAGNOSIS — O368190 Decreased fetal movements, unspecified trimester, not applicable or unspecified: Secondary | ICD-10-CM

## 2021-02-01 DIAGNOSIS — O34219 Maternal care for unspecified type scar from previous cesarean delivery: Secondary | ICD-10-CM | POA: Insufficient documentation

## 2021-02-01 DIAGNOSIS — O26899 Other specified pregnancy related conditions, unspecified trimester: Secondary | ICD-10-CM

## 2021-02-01 LAB — URINALYSIS, ROUTINE W REFLEX MICROSCOPIC
Bilirubin Urine: NEGATIVE
Glucose, UA: NEGATIVE mg/dL
Hgb urine dipstick: NEGATIVE
Ketones, ur: NEGATIVE mg/dL
Leukocytes,Ua: NEGATIVE
Nitrite: NEGATIVE
Protein, ur: NEGATIVE mg/dL
Specific Gravity, Urine: 1.011 (ref 1.005–1.030)
pH: 6 (ref 5.0–8.0)

## 2021-02-01 LAB — WET PREP, GENITAL
Clue Cells Wet Prep HPF POC: NONE SEEN
Sperm: NONE SEEN
Trich, Wet Prep: NONE SEEN
Yeast Wet Prep HPF POC: NONE SEEN

## 2021-02-01 MED ORDER — CYCLOBENZAPRINE HCL 5 MG PO TABS
10.0000 mg | ORAL_TABLET | Freq: Three times a day (TID) | ORAL | Status: DC | PRN
  Administered 2021-02-01: 10 mg via ORAL
  Filled 2021-02-01: qty 2

## 2021-02-01 NOTE — MAU Provider Note (Addendum)
History     CSN: [id number]  Arrival date and time: 02/01/21 1856   Event Date/Time   First Provider Initiated Contact with Patient 02/01/21 1953      Chief Complaint  Patient presents with  . Abdominal Pain  . Back Pain  . Emesis   25 y.o. [id number] .6 wks presenting with LBP, cramping, and pelvic pressure. LBP has been ongoing but worsened today. Describes as lower and bilateral. Rates pain 5/10. Tried Tylenol but didn't help. Shortly after she started having low abdominal cramps and pelvic pressure. Initially the cramps were intermittent but now feels constant.  Denies VB, LOF, and ctx. Denies urinary sx other than frequency which has been ongoing during pregnancy. Reports +FM but less than her other pregnancies, and less over the last 2 weeks. Felt 2 movements today.   OB History    Gravida  4   Para  2   Term  2   Preterm  0   AB  1   Living  2     SAB  1   IAB  0   Ectopic  0   Multiple  0   Live Births  2           Past Medical History:  Diagnosis Date  . Arthritis    Bilateral knees and back  . Breakthrough bleeding on birth control pills 05/16/2018  . Cesarean wound infection 03/18/2019  . Choroid plexus cysts, fetal, affecting care of mother, antepartum 09/17/2018  . Lumbar disc disease   . 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 PCOS (polycystic ovarian syndrome)   . Post-dates pregnancy 02/21/2019    Past Surgical History:  Procedure Laterality Date  . CESAREAN SECTION N/A 02/21/2019   Procedure: CESAREAN SECTION;  Surgeon: Nadara Mustard, MD;  Location: [facility] ORS;  Service: Obstetrics;  Laterality: N/A;  . KNEE SURGERY Left 2011   [name] has had 5 surgeries on her knee  . TONSILLECTOMY AND ADENOIDECTOMY  2007    Family History  Problem Relation Age of Onset  . Hypertension Mother   . Alcohol abuse Father   . Diabetes Maternal Grandmother   . Hypertension Maternal Grandmother   . Cancer Paternal Grandfather        skin    Social History    Tobacco Use  . Smoking status: Never Smoker  . Smokeless tobacco: Never Used  . Tobacco comment: Vape - Reduced Nicotine  Vaping Use  . Vaping Use: Former  . Substances: Nicotine  Substance Use Topics  . Alcohol use: No  . Drug use: No    Allergies:  Allergies  Allergen Reactions  . Hydrocodone Other (See Comments)    Other Reaction: SEVERE HA & GI Upset  . Oxycodone Other (See Comments)    Other Reaction: SEVERE HA & GI Upset  . Tape Rash    Medications Prior to Admission  Medication Sig Dispense Refill Last Dose  . aspirin EC 81 MG tablet Take 1 tablet (81 mg total) by mouth daily. Take after 12 weeks for prevention of preeclampsia later in pregnancy 300 tablet 2 02/01/2021 at Unknown time  . Prenatal MV & Min w/FA-DHA (ONE A DAY PRENATAL PO) Take by mouth.   02/01/2021 at Unknown time  . b complex vitamins tablet Take 1 tablet by mouth daily. (Patient not taking: Reported on 08/25/2020) 90 tablet 1   . cholecalciferol (VITAMIN D3) 25 MCG (1000 UNIT) tablet Take 1 tablet (1,000 Units total) by mouth daily. (Patient not  taking: Reported on 08/25/2020) 90 tablet 1   . Multiple Vitamin (MULTI-VITAMIN PO) Take by mouth daily. (Patient not taking: Reported on 08/25/2020)     . phentermine (ADIPEX-P) 37.5 MG tablet Take 1 tablet (37.5 mg total) by mouth daily before breakfast. (Patient not taking: Reported on 08/25/2020) 30 tablet 2   . sucralfate (CARAFATE) 1 g tablet Take 1 tablet (1 g total) by mouth 4 (four) times daily -  with meals and at bedtime. (Patient not taking: Reported on 08/25/2020) 120 tablet 0   . topiramate (TOPAMAX) 50 MG tablet Take 1 tablet (50 mg total) by mouth 2 (two) times daily. (Patient not taking: Reported on 08/25/2020) 180 tablet 1     Review of Systems  Gastrointestinal: Positive for abdominal pain and nausea. Negative for vomiting.  Genitourinary: Positive for frequency. Negative for dysuria, urgency, vaginal bleeding and vaginal discharge.   Musculoskeletal: Positive for back pain.   Physical Exam   Blood pressure 134/77, pulse (!) 119, temperature 98.2 F (36.8 C), temperature source Oral, resp. rate 20, height 5\' 6"  (1.676 m), weight 119 kg, currently breastfeeding.  Patient Vitals for the past 24 hrs:  BP Temp Temp src Pulse Resp Height Weight  02/01/21 1935 134/77 -- -- (!) 119 -- -- --  02/01/21 1922 131/85 98.2 F (36.8 C) Oral (!) 124 20 5\' 6"  (1.676 m) 119 kg   Physical Exam Vitals and nursing note reviewed. Exam conducted with a chaperone present.  Constitutional:      Appearance: Normal appearance.  HENT:     Head: Normocephalic and atraumatic.  Pulmonary:     Effort: Pulmonary effort is normal. No respiratory distress.  Abdominal:     Palpations: Abdomen is soft.     Tenderness: There is no abdominal tenderness. There is no right CVA tenderness or left CVA tenderness.  Genitourinary:    Comments: SVE: closed/thick Musculoskeletal:        General: Normal range of motion.     Cervical back: Normal range of motion.  Skin:    General: Skin is warm and dry.  Neurological:     General: No focal deficit present.     Mental Status: She is alert and oriented to person, place, and time.  Psychiatric:        Mood and Affect: Mood normal.        Behavior: Behavior normal.   EFM: 140 bpm, mod variability, + accels, no decels Toco: none  Results for orders placed or performed during the hospital encounter of 02/01/21 (from the past 24 hour(s))  Urinalysis, Routine w reflex microscopic Urine, Clean Catch     Status: Abnormal   Collection Time: 02/01/21  8:06 PM  Result Value Ref Range   Color, Urine YELLOW YELLOW   APPearance HAZY (A) CLEAR   Specific Gravity, Urine 1.011 1.005 - 1.030   pH 6.0 5.0 - 8.0   Glucose, UA NEGATIVE NEGATIVE mg/dL   Hgb urine dipstick NEGATIVE NEGATIVE   Bilirubin Urine NEGATIVE NEGATIVE   Ketones, ur NEGATIVE NEGATIVE mg/dL   Protein, ur NEGATIVE NEGATIVE mg/dL   Nitrite  NEGATIVE NEGATIVE   Leukocytes,Ua NEGATIVE NEGATIVE  Wet prep, genital     Status: Abnormal   Collection Time: 02/01/21  8:08 PM   Specimen: Vaginal  Result Value Ref Range   Yeast Wet Prep HPF POC NONE SEEN NONE SEEN   Trich, Wet Prep NONE SEEN NONE SEEN   Clue Cells Wet Prep HPF POC NONE SEEN NONE SEEN  WBC, Wet Prep HPF POC FEW (A) NONE SEEN   Sperm NONE SEEN    US MFM OB FOLLOW UP  Result Date: 01/07/2021 ----------------------------------------------------------------------  OBSTETRICS REPORT                       (Signed Final 01/07/2021 04:51 pm) ---------------------------------------------------------------------- Patient Info  ID #:       [id number]                          D.O.B.:  1996/06/28 (24 yrs)  Name:       Annie MainSAMANTHA K Dearman                Visit Date: 01/07/2021 03:55 pm ---------------------------------------------------------------------- Performed By  Attending:        Noralee Spaceavi Shankar MD        [facility]  Performed By:     Tommie RaymondMesha Tester BS,       Address:          [address]                    [address]                                                             [address]  Referred By:      Roxy CedarSAMANTHA C             [facility] ---------------------------------------------------------------------- Orders  #  Description                           Code        Ordered By  1  US MFM OB FOLLOW UP                   [id number]    YU FANG ----------------------------------------------------------------------  #  Order #                     Accession #  Episode #  1  [id number]                   [id number]                 [id number] ---------------------------------------------------------------------- Indications  [redacted] weeks  gestation of pregnancy                Z3A.29  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, antepartum (Previous CPC)  History of cesarean delivery, currently        O34.219  pregnant x 1  Obesity complicating pregnancy, third          O99.213  trimester  Low Risk NIPS  Poor obstetric history: Previous               O09.299  preeclampsia / eclampsia/gestational HTN  Fetal choroid plexus cyst                      O35.8XX0  Encounter for other antenatal screening        Z36.2  follow-up ---------------------------------------------------------------------- Vital Signs                                                 Height:        5'6" ---------------------------------------------------------------------- Fetal Evaluation  Num Of Fetuses:         1  Fetal Heart Rate(bpm):  115  Cardiac Activity:       Observed  Presentation:           Cephalic  Placenta:               Anterior  P. Cord Insertion:      Previously Visualized  Amniotic Fluid  AFI FV:      Subjectively upper-normal  AFI Sum(cm)     %Tile       Largest Pocket(cm)  23.7            95          9  RUQ(cm)       RLQ(cm)       LUQ(cm)        LLQ(cm)  9             5.2           4.8            4.7 ---------------------------------------------------------------------- Biometry  BPD:      78.5  mm     G. Age:  31w 4d         94  %    CI:        74.44   %    70 - 86                                                          FL/HC:      20.5   %    19.6 - 20.8  HC:      288.8  mm     G. Age:  31w 5d         86  %  HC/AC:      1.07        0.99 - 1.21  AC:      269.5  mm     G. Age:  31w 0d         89  %    FL/BPD:     75.5   %    71 - 87  FL:       59.3  mm     G. Age:  30w 6d         80  %    FL/AC:      22.0   %    20 - 24  CER:      36.8  mm     G. Age:  30w 3d         83  %  LV:        6.7  mm  CM:        7.1  mm  Est. FW:    1703  gm    3 lb 12 oz      93  % ---------------------------------------------------------------------- OB History   Gravidity:    4         Term:   2        Prem:   0        SAB:   1  TOP:          0       Ectopic:  0        Living: 2 ---------------------------------------------------------------------- Gestational Age  U/S Today:     31w 2d                                        EDD:   03/09/21  Best:          29w 2d     Det. By:  Marcella Dubs         EDD:   03/23/21                                      (08/18/20) ---------------------------------------------------------------------- Anatomy  Cranium:               Appears normal         Aortic Arch:            Appears normal  Cavum:                 Appears normal         Ductal Arch:            Appears normal  Ventricles:            Appears normal         Diaphragm:              Appears normal  Choroid Plexus:        Right choroid          Stomach:                Appears normal, left                         plexus cysts  sided  Cerebellum:            Appears normal         Abdomen:                Appears normal  Posterior Fossa:       Appears normal         Abdominal Wall:         Appears nml (cord                                                                        insert, abd wall)  Nuchal Fold:           Previously seen        Cord Vessels:           Previously seen  Face:                  Appears normal         Kidneys:                Appear normal                         (orbits and profile)  Lips:                  Previously seen        Bladder:                Appears normal  Thoracic:              Appears normal         Spine:                  Previously seen  Heart:                 Appears normal         Upper Extremities:      Previously seen                         (4CH, axis, and                         situs)  RVOT:                  Appears normal         Lower Extremities:      Previously seen  LVOT:                  Appears normal  Other:  Heels visualized previously. Right Open hand  visualized. Technically          difficult due to maternal habitus and fetal position. ---------------------------------------------------------------------- Cervix Uterus Adnexa  Cervix  Normal appearance by transabdominal scan.  Uterus  No abnormality visualized.  Right Ovary  Within normal limits.  Left Ovary  Simple cyst measuring 3.0 x 2.3 x 2.1 cm.  Cul De Sac  No free fluid seen.  Adnexa  No abnormality visualized. ---------------------------------------------------------------------- Impression  Maternal obesity. History of preeclampsia in her first  pregnancy.  The estimated fetal weight is at the  93rd percentile. Amniotic  fluid is normal and good fetal activity is seen . RIght choroid  plexus cyst is seen.  BP at our office: 129/94 mm Hg .  I encouraged her to get a blood pressure cuff and check her  blood pressures at home. Patient takes low-dose aspirin  prophylaxis.  She will be undergoing screening for gestational diabetes. ---------------------------------------------------------------------- Recommendations  -An appointment was made for her to return in 4 weeks for  fetal growth assessment (history of preeclampsia) ----------------------------------------------------------------------                  Noralee Space, MD Electronically Signed Final Report   01/07/2021 04:51 pm ----------------------------------------------------------------------  MAU Course  Procedures Flexeril  MDM Labs and BPP ordered.  Transfer of care given to Celestia Duva, NP Irianna Gilday, Odie Sera, NP  02/01/2021 9:55 PM   LBP, cramping, pelvic pressure DFM No CVA tenderness or abdominal tenderness on exam Cervix closed EFM: 140 bpm, mod variability, + accels, no decels Toco: none, few ctx at discharge, pt not feeling BPP: 6/8 (-2 for breathing), AFI 24.5 Urine sent for cultures WetPrep: WNL GC/CT collected Consulted with Dr. Vergie Living who reviewed tracing, per Dr. Vergie Living, pt is OK to be discharged home, advised to  remind patient that she needs to be seen for glucose test ASAP. Patient pain reported much improved with Flexeril and she is ready to be discharged home Pt discharged to home in stable condition  Orders Placed This Encounter  Procedures  . Wet prep, genital    Standing Status:   Standing    Number of Occurrences:   1    Order Specific Question:   Patient immune status    Answer:   Normal  . Culture, OB Urine    Standing Status:   Standing    Number of Occurrences:   1  . [place] MFM FETAL BPP WO NON STRESS    Standing Status:   Standing    Number of Occurrences:   1    Order Specific Question:   What location should the exam be performed?    Answer:   [facility] ULTRASOUND    Order Specific Question:   Symptom/Reason for Exam    Answer:   Decreased fetal movement [id number]  . Urinalysis, Routine w reflex microscopic Urine, Clean Catch    Standing Status:   Standing    Number of Occurrences:   1  . Discharge patient    Order Specific Question:   Discharge disposition    Answer:   01-Home or Self Care [1]    Order Specific Question:   Discharge patient date    Answer:   02/01/2021   Meds ordered this encounter  Medications  . cyclobenzaprine (FLEXERIL) tablet 10 mg   Assessment and Plan   1. Low back pain during pregnancy in third trimester   2. Previous cesarean delivery affecting pregnancy, antepartum   3. Decreased fetal movement   4. Pelvic cramping   5. Pelvic pressure in pregnancy   6. Decreased fetal movements in third trimester, fetus 1 of multiple gestation   7. NST (non-stress test) reactive    Allergies as of 02/01/2021      Reactions   Hydrocodone Other (See Comments)   Other Reaction: SEVERE HA & GI Upset   Oxycodone Other (See Comments)   Other Reaction: SEVERE HA & GI Upset   Tape Rash      Medication List    TAKE these medications   aspirin EC  81 MG tablet Take 1 tablet (81 mg total) by mouth daily. Take after 12 weeks for prevention of preeclampsia later in  pregnancy   b complex vitamins tablet Take 1 tablet by mouth daily.   cholecalciferol 25 MCG (1000 UNIT) tablet Commonly known as: VITAMIN D3 Take 1 tablet (1,000 Units total) by mouth daily.   MULTI-VITAMIN PO Take by mouth daily.   ONE A DAY PRENATAL PO Take by mouth.   phentermine 37.5 MG tablet Commonly known as: ADIPEX-P Take 1 tablet (37.5 mg total) by mouth daily before breakfast.   sucralfate 1 g tablet Commonly known as: CARAFATE Take 1 tablet (1 g total) by mouth 4 (four) times daily -  with meals and at bedtime.   topiramate 50 MG tablet Commonly known as: TOPAMAX Take 1 tablet (50 mg total) by mouth 2 (two) times daily.      -will call with culture results, if positive -pt advised needs to be seen in person ASAP for glucose test -return MAU precautions given -pt discharged to home in stable condition  Jaxyn Mestas, Odie Sera, NP  9:55 PM 02/01/2021

## 2021-02-01 NOTE — Discharge Instructions (Signed)
Fetal Health Monitoring Overview Health care providers use techniques and tests to monitor a developing baby (fetus) before birth. Evaluation of your baby's well-being can help your health care provider identify possible problems for you and your baby. Fetal assessments can:  Guide health care providers in how best to care for your unborn baby.  Check your unborn baby's overall health. All types of monitoring aim to protect your health and that of your baby. The best way to have a healthy pregnancy and a healthy baby is to learn as much as you can about your pregnancy and to work closely with your health care provider. Types of tests used in fetal monitoring Tests that will be done vary. Tests may include:  Fetal movement counts.  Non-stress test.  Biophysical profile.  Modified biophysical profile.  Contraction stress test.  Umbilical artery Doppler velocimetry. Your health care provider will determine which tests are right for you. No single test is perfectly accurate, and you may need to follow up with your health care provider if there are any concerns. Tell a health care provider about:  Any allergies you have.  All medicines you are taking, including vitamins, herbs, eye drops, creams, and over-the-counter medicines.  Any blood disorders you have.  Any surgeries you have had.  Any medical conditions you have such as diabetes or high blood pressure.  How often you feel your baby move.  Any concerns you have about your pregnancy. What are the risks? Discuss your tests with your health care provider. In most cases, there are no risks to you or your baby during any of these tests. What happens before the test? Your health care provider will tell you how to prepare for the test. Ask your health care provider about eating and drinking, taking medicines, and other questions you may have. What happens during the test? Fetal movement counts After 16 weeks of pregnancy, you may  feel your baby move. As your baby gets bigger, these movements are easier to feel. A fetal movement count is when you count the number of kicks, flutters, swishes, rolls, or jabs over a specific period of time. You record the number and report it to your health care provider. This is strongly encouraged in high-risk pregnancies but is generally recommended for all pregnant women to do. Your health care provider may ask you to start counting fetal movements around 28 weeks of pregnancy. Non-stress test The non-stress test evaluates your baby's heartbeat while your baby is at rest and while your baby is moving. It is often done as part of a set of tests called the biophysical profile and may show signs that it is time for your baby to be delivered. The heart rate in a healthy baby will speed up when the baby moves or kicks. The heart rate will decrease at rest, and the peaks or accelerations of the heart rate will be lower. If the test brings up any questions or concerns, you may need to have more testing. This test may be done if your pregnancy goes past your due date. It is also commonly done in high-risk pregnancies beginning at 32 weeks. Biophysical profile The biophysical profile is a set of tests that are done to find out how well the baby is doing. It includes the non-stress test along with imaging tests that use sound waves (ultrasound) to create images of your baby. In a biophysical profile, the following tests are done and scored:  Fetal heartbeat.  Fetal breathing.  Fetal  movements.  Fetal muscle tone.  Amount of amniotic fluid. Each test gets a score. The scores are then added together for a total score. A low score may mean that you and your baby need additional monitoring or special care. Sometimes your health care provider may recommend that you deliver early. This test is usually done late in your final 3 months (third trimester) of pregnancy.   Modified biophysical profile A  modified biophysical profile is a two-part test. You will have:  An ultrasound exam to check how much fluid is surrounding your baby inside of your womb (amniotic fluid).  A non-stress test to check your baby's heart rate. The results will determine whether a full biophysical profile may be needed. This test is usually done late in your final 3 months (third trimester) of pregnancy. Contraction stress test A contraction stress test monitors the heart rate of your baby during contractions. The test checks to see how your baby will tolerate the stress of labor. Health care providers use this test to further evaluate your baby when other tests, such as the biophysical profile, have shown that there may be a problem. This test may be done:  Any time you are contracting.  Between weeks 32 and 34 of your pregnancy.  Later in the pregnancy, if necessary. Umbilical artery Doppler velocimetry Umbilical artery Doppler velocimetry uses sound waves to measure the flow of blood between you and your baby. This test measures:  The amount of blood flow through the cord that attaches your baby to your placenta (umbilical cord).  Speed of the blood flow. You will likely only need this test to check your baby's condition if you have a high-risk pregnancy. What can I expect after the test?  Your health care provider will discuss the results of testing and make a plan that is right for you and your baby.  You may be given more instructions depending on the test you were given.  Keep all follow-up visits, including visits for tests and routine care. This is important. Summary  Various tests may be offered to you during your pregnancy to evaluate the well-being of your baby.  Talk to your health care provider about any concerns you may have about your pregnancy and what tests might be right for you.  Keep all follow-up visits. This is important. This information is not intended to replace advice given to  you by your health care provider. Make sure you discuss any questions you have with your health care provider. Document Revised: 02/17/2020 Document Reviewed: 02/17/2020 Elsevier Patient Education  2021 [employer]        Oral Glucose Tolerance Test During Pregnancy Why am I having this test? The oral glucose tolerance test (OGTT) is done to check how your body processes blood sugar (glucose). This is one of several tests used to diagnose diabetes that develops during pregnancy (gestational diabetes mellitus). Gestational diabetes is a short-term form of diabetes that some women develop while they are pregnant. It usually occurs during the second trimester of pregnancy and goes away after delivery. Testing, or screening, for gestational diabetes usually occurs at weeks 24-28 of pregnancy. You may have the OGTT test after having a 1-hour glucose screening test if the results from that test indicate that you may have gestational diabetes. This test may also be needed if:  You have a history of gestational diabetes.  There is a history of giving birth to very large babies or of losing pregnancies (having stillbirths).  You have signs and symptoms of diabetes, such as: ? Changes in your eyesight. ? Tingling or numbness in your hands or feet. ? Changes in hunger, thirst, and urination, and these are not explained by your pregnancy. What is being tested? This test measures the amount of glucose in your blood at different times during a period of 3 hours. This shows how well your body can process glucose. What kind of sample is taken? Blood samples are required for this test. They are usually collected by inserting a needle into a blood vessel.   How do I prepare for this test?  For 3 days before your test, eat normally. Have plenty of carbohydrate-rich foods.  Follow instructions from your health care provider about: ? Eating or drinking restrictions on the day of the test. You may be  asked not to eat or drink anything other than water (to fast) starting 8-10 hours before the test. ? Changing or stopping your regular medicines. Some medicines may interfere with this test. Tell a health care provider about:  All medicines you are taking, including vitamins, herbs, eye drops, creams, and over-the-counter medicines.  Any blood disorders you have.  Any surgeries you have had.  Any medical conditions you have. What happens during the test? First, your blood glucose will be measured. This is referred to as your fasting blood glucose because you fasted before the test. Then, you will drink a glucose solution that contains a certain amount of glucose. Your blood glucose will be measured again 1, 2, and 3 hours after you drink the solution. This test takes about 3 hours to complete. You will need to stay at the testing location during this time. During the testing period:  Do not eat or drink anything other than the glucose solution.  Do not exercise.  Do not use any products that contain nicotine or tobacco, such as cigarettes, e-cigarettes, and chewing tobacco. These can affect your test results. If you need help quitting, ask your health care provider. The testing procedure may vary among health care providers and hospitals. How are the results reported? Your results will be reported as milligrams of glucose per deciliter of blood (mg/dL) or millimoles per liter (mmol/L). There is more than one source for screening and diagnosis reference values used to diagnose gestational diabetes. Your health care provider will compare your results to normal values that were established after testing a large group of people (reference values). Reference values may vary among labs and hospitals. For this test (Carpenter-Coustan), reference values are:  Fasting: 95 mg/dL (5.3 mmol/L).  1 hour: 180 mg/dL (16.110.0 mmol/L).  2 hour: 155 mg/dL (8.6 mmol/L).  3 hour: 140 mg/dL (7.8 mmol/L). What  do the results mean? Results below the reference values are considered normal. If two or more of your blood glucose levels are at or above the reference values, you may be diagnosed with gestational diabetes. If only one level is high, your health care provider may suggest repeat testing or other tests to confirm a diagnosis. Talk with your health care provider about what your results mean. Questions to ask your health care provider Ask your health care provider, or the department that is doing the test:  When will my results be ready?  How will I get my results?  What are my treatment options?  What other tests do I need?  What are my next steps? Summary  The oral glucose tolerance test (OGTT) is one of several tests used to diagnose  diabetes that develops during pregnancy (gestational diabetes mellitus). Gestational diabetes is a short-term form of diabetes that some women develop while they are pregnant.  You may have the OGTT test after having a 1-hour glucose screening test if the results from that test show that you may have gestational diabetes. You may also have this test if you have any symptoms or risk factors for this type of diabetes.  Talk with your health care provider about what your results mean. This information is not intended to replace advice given to you by your health care provider. Make sure you discuss any questions you have with your health care provider. Document Revised: 03/11/2020 Document Reviewed: 03/11/2020 Elsevier Patient Education  2021 [employer]         Back Pain in Pregnancy Back pain during pregnancy is common. Back pain may be caused by several factors that are related to changes during your pregnancy. Follow these instructions at home: Managing pain, stiffness, and swelling  If directed, for sudden (acute) back pain, put ice on the painful area. ? Put ice in a plastic bag. ? Place a towel between your skin and the bag. ? Leave the  ice on for 20 minutes, 2-3 times per day.  If directed, apply heat to the affected area before you exercise. Use the heat source that your health care provider recommends, such as a moist heat pack or a heating pad. ? Place a towel between your skin and the heat source. ? Leave the heat on for 20-30 minutes. ? Remove the heat if your skin turns bright red. This is especially important if you are unable to feel pain, heat, or cold. You may have a greater risk of getting burned.  If directed, massage the affected area.      Activity  Exercise as told by your health care provider. Gentle exercise is the best way to prevent or manage back pain.  Listen to your body when lifting. If lifting hurts, ask for help or bend your knees. This uses your leg muscles instead of your back muscles.  Squat down when picking up something from the floor. Do not bend over.  Only use bed rest for short periods as told by your health care provider. Bed rest should only be used for the most severe episodes of back pain. Standing, sitting, and lying down  Do not stand in one place for long periods of time.  Use good posture when sitting. Make sure your head rests over your shoulders and is not hanging forward. Use a pillow on your lower back if necessary.  Try sleeping on your side, preferably the left side, with a pregnancy support pillow or 1-2 regular pillows between your legs. ? If you have back pain after a night's rest, your bed may be too soft. ? A firm mattress may provide more support for your back during pregnancy. General instructions  Do not wear high heels.  Eat a healthy diet. Try to gain weight within your health care provider's recommendations.  Use a maternity girdle, elastic sling, or back brace as told by your health care provider.  Take over-the-counter and prescription medicines only as told by your health care provider.  Work with a physical therapist or massage therapist to find  ways to manage back pain. Acupuncture or massage therapy may be helpful.  Keep all follow-up visits as told by your health care provider. This is important. Contact a health care provider if:  Your back pain interferes  with your daily activities.  You have increasing pain in other parts of your body. Get help right away if:  You develop numbness, tingling, weakness, or problems with the use of your arms or legs.  You develop severe back pain that is not controlled with medicine.  You have a change in bowel or bladder control.  You develop shortness of breath, dizziness, or you faint.  You develop nausea, vomiting, or sweating.  You have back pain that is a rhythmic, cramping pain similar to labor pains. Labor pain is usually 1-2 minutes apart, lasts for about 1 minute, and involves a bearing down feeling or pressure in your pelvis.  You have back pain and your water breaks or you have vaginal bleeding.  You have back pain or numbness that travels down your leg.  Your back pain developed after you fell.  You develop pain on one side of your back.  You see blood in your urine.  You develop skin blisters in the area of your back pain. Summary  Back pain may be caused by several factors that are related to changes during your pregnancy.  Follow instructions as told by your health care provider for managing pain, stiffness, and swelling.  Exercise as told by your health care provider. Gentle exercise is the best way to prevent or manage back pain.  Take over-the-counter and prescription medicines only as told by your health care provider.  Keep all follow-up visits as told by your health care provider. This is important. This information is not intended to replace advice given to you by your health care provider. Make sure you discuss any questions you have with your health care provider. Document Revised: 01/21/2019 Document Reviewed: 03/20/2018 Elsevier Patient Education   2021 [employer]        Preterm Labor The normal length of a pregnancy is 39-41 weeks. Preterm labor is when labor starts before 37 completed weeks of pregnancy. Babies who are born prematurely and survive may not be fully developed and may be at an increased risk for long-term problems such as cerebral palsy, developmental delays, and vision and hearing problems. Babies who are born too early may have problems soon after birth. Problems may include regulating blood sugar, body temperature, heart rate, and breathing rate. These babies often have trouble with feeding. The risk of having problems is highest for babies who are born before 34 weeks of pregnancy. What are the causes? The exact cause of this condition is not known. What increases the risk? You are more likely to have preterm labor if you have certain risk factors that relate to your medical history, problems with present and past pregnancies, and lifestyle factors. Medical history  You have abnormalities of the uterus, including a short cervix.  You have STIs (sexually transmitted infections), or other infections of the urinary tract and the vagina.  You have chronic illnesses, such as blood clotting problems, diabetes, or high blood pressure.  You are overweight or underweight. Present and past pregnancies  You have had preterm labor before.  You are pregnant with twins or other multiples.  You have been diagnosed with a condition in which the placenta covers your cervix (placenta previa).  You waited less than 6 months between giving birth and becoming pregnant again.  Your unborn baby has some abnormalities.  You have vaginal bleeding during pregnancy.  You became pregnant through in vitro fertilization (IVF). Lifestyle and environmental factors  You use tobacco products.  You drink alcohol.  You use street drugs.  You have stress and no social support.  You experience domestic violence.  You are  exposed to certain chemicals or environmental pollutants. Other factors  You are younger than age 6 or older than age 64. What are the signs or symptoms? Symptoms of this condition include:  Cramps similar to those that can happen during a menstrual period. The cramps may happen with diarrhea.  Pain in the abdomen or lower back.  Regular contractions that may feel like tightening of the abdomen.  A feeling of increased pressure in the pelvis.  Increased watery or bloody mucus discharge from the vagina.  Water breaking (ruptured amniotic sac). How is this diagnosed? This condition is diagnosed based on:  Your medical history and a physical exam.  A pelvic exam.  An ultrasound.  Monitoring your uterus for contractions.  Other tests, including: ? A swab of the cervix to check for a chemical called fetal fibronectin. ? Urine tests. How is this treated? Treatment for this condition depends on the length of your pregnancy, your condition, and the health of your baby. Treatment may include:  Taking medicines, such as: ? Hormone medicines. These may be given early in pregnancy to help support the pregnancy. ? Medicines to stop contractions. ? Medicines to help mature the baby's lungs. These may be prescribed if the risk of delivery is high. ? Medicines to prevent your baby from developing cerebral palsy.  Bed rest. If the labor happens before 34 weeks of pregnancy, you may need to stay in the hospital.  Delivery of the baby. Follow these instructions at home:  Do not use any products that contain nicotine or tobacco, such as cigarettes, e-cigarettes, and chewing tobacco. If you need help quitting, ask your health care provider.  Do not drink alcohol.  Take over-the-counter and prescription medicines only as told by your health care provider.  Rest as told by your health care provider.  Return to your normal activities as told by your health care provider. Ask your  health care provider what activities are safe for you.  Keep all follow-up visits as told by your health care provider. This is important.   How is this prevented? To increase your chance of having a full-term pregnancy:  Do not use street drugs or medicines that have not been prescribed to you during your pregnancy.  Talk with your health care provider before taking any herbal supplements, even if you have been taking them regularly.  Make sure you gain a healthy amount of weight during your pregnancy.  Watch for infection. If you think that you might have an infection, get it checked right away. Symptoms of infection may include: ? Fever. ? Abnormal vaginal discharge or discharge that smells bad. ? Pain or burning with urination. ? Needing to urinate urgently. ? Frequently urinating or passing small amounts of urine frequently. ? Blood in your urine. ? Urine that smells bad or unusual.  Tell your health care provider if you have had preterm labor before. Contact a health care provider if:  You think you are going into preterm labor.  You have signs or symptoms of preterm labor.  You have symptoms of infection. Get help right away if:  You are having regular, painful contractions every 5 minutes or less.  Your water breaks. Summary  Preterm labor is labor that starts before you reach 37 weeks of pregnancy.  Delivering your baby early increases your baby's risk of developing lifelong problems.  The  exact cause of preterm labor is unknown. However, having an abnormal uterus, an STI (sexually transmitted infection), or vaginal bleeding during pregnancy increases your risk for preterm labor.  Keep all follow-up visits as told by your health care provider. This is important.  Contact a health care provider if you have signs or symptoms of preterm labor. This information is not intended to replace advice given to you by your health care provider. Make sure you discuss any  questions you have with your health care provider. Document Revised: 11/04/2019 Document Reviewed: 11/04/2019 Elsevier Patient Education  2021 [employer]        Fetal Movement Counts Patient Name: ________________________________________________ Patient Due Date: ____________________  What is a fetal movement count? A fetal movement count is the number of times that you feel your baby move during a certain amount of time. This may also be called a fetal kick count. A fetal movement count is recommended for every pregnant woman. You may be asked to start counting fetal movements as early as week 28 of your pregnancy. Pay attention to when your baby is most active. You may notice your baby's sleep and wake cycles. You may also notice things that make your baby move more. You should do a fetal movement count:  When your baby is normally most active.  At the same time each day. A good time to count movements is while you are resting, after having something to eat and drink. How do I count fetal movements? 1. Find a quiet, comfortable area. Sit, or lie down on your side. 2. Write down the date, the start time and stop time, and the number of movements that you felt between those two times. Take this information with you to your health care visits. 3. Write down your start time when you feel the first movement. 4. Count kicks, flutters, swishes, rolls, and jabs. You should feel at least 10 movements. 5. You may stop counting after you have felt 10 movements, or if you have been counting for 2 hours. Write down the stop time. 6. If you do not feel 10 movements in 2 hours, contact your health care provider for further instructions. Your health care provider may want to do additional tests to assess your baby's well-being. Contact a health care provider if:  You feel fewer than 10 movements in 2 hours.  Your baby is not moving like he or she usually does. Date: ____________ Start time:  ____________ Stop time: ____________ Movements: ____________ Date: ____________ Start time: ____________ Stop time: ____________ Movements: ____________ Date: ____________ Start time: ____________ Stop time: ____________ Movements: ____________ Date: ____________ Start time: ____________ Stop time: ____________ Movements: ____________ Date: ____________ Start time: ____________ Stop time: ____________ Movements: ____________ Date: ____________ Start time: ____________ Stop time: ____________ Movements: ____________ Date: ____________ Start time: ____________ Stop time: ____________ Movements: ____________ Date: ____________ Start time: ____________ Stop time: ____________ Movements: ____________ Date: ____________ Start time: ____________ Stop time: ____________ Movements: ____________ This information is not intended to replace advice given to you by your health care provider. Make sure you discuss any questions you have with your health care provider. Document Revised: 05/22/2019 Document Reviewed: 05/22/2019 Elsevier Patient Education  2021 [employer].

## 2021-02-01 NOTE — MAU Note (Signed)
PT SAYS THIS AM WOKE WITH BACK PAIN-TOOK  REG TYLENOL 2 TABS - AT 930AM- NO RELIEF. AT 12NOON- AT HOME - FELT CRAMPING -HAD 5  LOOSE STOOLS .  AT 2 PM- CONSTANT PRESSURE PAIN IN LOWER ABD 4/10- NO MEDS .  AT 4PM- ALL SYMPTOMS SAME AND ALSO NAUSEATED-  NO MEDS . BABY HAS MOVED LESS X2 WEEKS - TOLD DR- NOT WORRIED.  NOW- BACK 7/10 ,  PRESSURE/ PAIN 01/18/09,  NAUSEA- NO VOMITING  [facility]

## 2021-02-02 ENCOUNTER — Telehealth: Payer: Self-pay

## 2021-02-02 DIAGNOSIS — O352XX0 Maternal care for (suspected) hereditary disease in fetus, not applicable or unspecified: Secondary | ICD-10-CM

## 2021-02-02 DIAGNOSIS — Z8759 Personal history of other complications of pregnancy, childbirth and the puerperium: Secondary | ICD-10-CM

## 2021-02-02 DIAGNOSIS — O99213 Obesity complicating pregnancy, third trimester: Secondary | ICD-10-CM

## 2021-02-02 DIAGNOSIS — O34219 Maternal care for unspecified type scar from previous cesarean delivery: Secondary | ICD-10-CM

## 2021-02-02 DIAGNOSIS — O09293 Supervision of pregnancy with other poor reproductive or obstetric history, third trimester: Secondary | ICD-10-CM

## 2021-02-02 DIAGNOSIS — O368130 Decreased fetal movements, third trimester, not applicable or unspecified: Secondary | ICD-10-CM | POA: Diagnosis not present

## 2021-02-02 DIAGNOSIS — Z3A32 32 weeks gestation of pregnancy: Secondary | ICD-10-CM

## 2021-02-02 DIAGNOSIS — E669 Obesity, unspecified: Secondary | ICD-10-CM

## 2021-02-02 LAB — GC/CHLAMYDIA PROBE AMP (~~LOC~~) NOT AT ARMC
Chlamydia: NEGATIVE
Comment: NEGATIVE
Comment: NORMAL
Neisseria Gonorrhea: NEGATIVE

## 2021-02-02 NOTE — Telephone Encounter (Signed)
Mar/sw patient/arrive@215p ; 1 vistor 13+ allowed, no children.

## 2021-02-03 LAB — CULTURE, OB URINE

## 2021-02-04 ENCOUNTER — Ambulatory Visit: Payer: Medicaid Other | Admitting: *Deleted

## 2021-02-04 ENCOUNTER — Encounter: Payer: Self-pay | Admitting: *Deleted

## 2021-02-04 ENCOUNTER — Other Ambulatory Visit: Payer: Self-pay

## 2021-02-04 ENCOUNTER — Ambulatory Visit: Payer: Medicaid Other | Attending: Obstetrics and Gynecology

## 2021-02-04 DIAGNOSIS — O99213 Obesity complicating pregnancy, third trimester: Secondary | ICD-10-CM | POA: Diagnosis not present

## 2021-02-04 DIAGNOSIS — O3663X0 Maternal care for excessive fetal growth, third trimester, not applicable or unspecified: Secondary | ICD-10-CM

## 2021-02-04 DIAGNOSIS — O352XX0 Maternal care for (suspected) hereditary disease in fetus, not applicable or unspecified: Secondary | ICD-10-CM | POA: Diagnosis not present

## 2021-02-04 DIAGNOSIS — O34219 Maternal care for unspecified type scar from previous cesarean delivery: Secondary | ICD-10-CM | POA: Insufficient documentation

## 2021-02-04 DIAGNOSIS — E669 Obesity, unspecified: Secondary | ICD-10-CM

## 2021-02-04 DIAGNOSIS — Z8759 Personal history of other complications of pregnancy, childbirth and the puerperium: Secondary | ICD-10-CM

## 2021-02-04 DIAGNOSIS — O368130 Decreased fetal movements, third trimester, not applicable or unspecified: Secondary | ICD-10-CM

## 2021-02-04 DIAGNOSIS — Z3A33 33 weeks gestation of pregnancy: Secondary | ICD-10-CM

## 2021-02-09 ENCOUNTER — Other Ambulatory Visit: Payer: Self-pay

## 2021-02-09 ENCOUNTER — Telehealth: Payer: Self-pay | Admitting: Radiology

## 2021-02-09 ENCOUNTER — Telehealth (INDEPENDENT_AMBULATORY_CARE_PROVIDER_SITE_OTHER): Payer: Medicaid Other | Admitting: Student

## 2021-02-09 DIAGNOSIS — O99213 Obesity complicating pregnancy, third trimester: Secondary | ICD-10-CM

## 2021-02-09 DIAGNOSIS — O34219 Maternal care for unspecified type scar from previous cesarean delivery: Secondary | ICD-10-CM

## 2021-02-09 DIAGNOSIS — O3663X0 Maternal care for excessive fetal growth, third trimester, not applicable or unspecified: Secondary | ICD-10-CM

## 2021-02-09 DIAGNOSIS — E669 Obesity, unspecified: Secondary | ICD-10-CM

## 2021-02-09 DIAGNOSIS — Z3A34 34 weeks gestation of pregnancy: Secondary | ICD-10-CM

## 2021-02-09 NOTE — Progress Notes (Signed)
Patient ID: Kimberly Farmer, female   DOB: 10-07-1996, 25 y.o.   MRN: [id number] I connected with RICCI PAFF 02/09/21 at  1:10 PM EDT by: MyChart video and verified that I am speaking with the correct person using two identifiers.  Patient is located at home and provider is located at [facility].    The purpose of this virtual visit is to provide medical care while limiting exposure to the novel coronavirus. I discussed the limitations, risks, security and privacy concerns of performing an evaluation and management service by MyChart video and the availability of in person appointments. I also discussed with the patient that there may be a patient responsible charge related to this service. By engaging in this virtual visit, you consent to the provision of healthcare.  Additionally, you authorize for your insurance to be billed for the services provided during this visit.  The patient expressed understanding and agreed to proceed.  The following staff members participated in the virtual visit: [name].     PRENATAL VISIT NOTE  Subjective:  Kimberly Farmer is a 25 y.o. [id number] at [redacted]w[redacted]d  for phone visit for ongoing prenatal care.  She is currently monitored for the following issues for this high-risk pregnancy and has Airway hyperreactivity; F/H of alcoholism; Osteochondritis dissecans of bilateral knees; Bone disease; Chronic midline low back pain without sciatica; BMI 37.0-37.9, adult; PCOS (polycystic ovarian syndrome); Choroid plexus cysts, fetal, affecting care of mother, antepartum; Supervision of other normal pregnancy, antepartum; Hx of preeclampsia, prior pregnancy, currently pregnant, first trimester; Previous cesarean delivery affecting pregnancy, antepartum; Lactating mother; Transient hypertension of pregnancy in third trimester; and Excessive fetal growth affecting management of mother in third trimester, antepartum on their problem list.  Patient reports back pain. She denies blurry  vision, headaches, floating spots, RUQ pain or sudden overall swelling. .  Contractions: Not present. Vag. Bleeding: None.  Movement: Present. Denies leaking of fluid.   The following portions of the patient's history were reviewed and updated as appropriate: allergies, current medications, past family history, past medical history, past social history, past surgical history and problem list.   Objective:  There were no vitals filed for this visit. Self-Obtained  Fetal Status:     Movement: Present     Assessment and Plan:  Pregnancy: [id number] at [redacted]w[redacted]d 1. Previous cesarean delivery affecting pregnancy, antepartum -Patient does not want her children to come to appt at 36 weeks due to their highly active nature. She does not know how she will make her in person appt. As a work-around, she can have virtual 36 week appt and d then come in for self swabs of GC CT and GBS and a blood draw. (Patient has c/section scheduled so GC CT and GBS are ancillary only) -will draw HgbA1 and spot check blood sugar at that visit as patient has not had 2 hour GTT -reviewed last growth was in 96%tile  Preterm labor symptoms and general obstetric precautions including but not limited to vaginal bleeding, contractions, leaking of fluid and fetal movement were reviewed in detail with the patient.  No follow-ups on file.  No future appointments.   Time spent on virtual visit: 20 minutes  Marylene Land, CNM

## 2021-02-09 NOTE — Progress Notes (Signed)
I connected with  Kimberly Farmer on 02/09/21 at  1:10 PM EDT by telephone and verified that I am speaking with the correct person using two identifiers.   I discussed the limitations, risks, security and privacy concerns of performing an evaluation and management service by telephone and the availability of in person appointments. I also discussed with the patient that there may be a patient responsible charge related to this service. The patient expressed understanding and agreed to proceed.  [name], RN 02/09/2021  1:10 PM   Does not have BP machine, and will call back to schedule her 36 week visit

## 2021-02-09 NOTE — Telephone Encounter (Signed)
Spoke with patient on 02/09/21 for virtual visit, tried to schedule 36 wk ROB appointment, patient stated that she would need to call back, does not want small children there for the GBS swab.

## 2021-02-10 ENCOUNTER — Telehealth: Payer: Self-pay | Admitting: *Deleted

## 2021-02-10 ENCOUNTER — Encounter: Payer: Self-pay | Admitting: *Deleted

## 2021-02-10 ENCOUNTER — Telehealth: Payer: Self-pay | Admitting: Radiology

## 2021-02-10 NOTE — Telephone Encounter (Signed)
Called patient and spoke with her regarding difficulty surrounding child care for upcoming C-section. Discussed that scheduling her delivery prior to 39wks would not be possible but we could push it out further if needed. She will continue to explore her childcare options and let us know.

## 2021-02-10 NOTE — Telephone Encounter (Signed)
Pt called requesting that her c section date be changed form June 1st due to her husband not being bale to get off work, She would like the before or the Friday after. Okay per Dr Shawnie Pons to move to Friday June 3rd, will send a message to the scheduler.

## 2021-02-10 NOTE — Telephone Encounter (Signed)
Patient was informed of new surgery date and time 03/18/21 @ 9:30

## 2021-02-10 NOTE — Telephone Encounter (Signed)
C-section rescheduled to Friday, 03-18-21 at 0930- arrive 0730.  Call to patient. Voice mail is not set-up. Unable to leave message.   Updated letter sent to patient via My Chart and mail.

## 2021-02-23 ENCOUNTER — Other Ambulatory Visit: Payer: Self-pay

## 2021-02-23 ENCOUNTER — Ambulatory Visit (INDEPENDENT_AMBULATORY_CARE_PROVIDER_SITE_OTHER): Payer: Medicaid Other | Admitting: Advanced Practice Midwife

## 2021-02-23 ENCOUNTER — Other Ambulatory Visit (HOSPITAL_COMMUNITY)
Admission: RE | Admit: 2021-02-23 | Discharge: 2021-02-23 | Disposition: A | Discharge status: Home / Self Care | Payer: Medicaid Other | Source: Ambulatory Visit | Attending: Advanced Practice Midwife | Admitting: Advanced Practice Midwife

## 2021-02-23 VITALS — BP 106/72 | HR 120

## 2021-02-23 DIAGNOSIS — O99210 Obesity complicating pregnancy, unspecified trimester: Secondary | ICD-10-CM

## 2021-02-23 DIAGNOSIS — Z3480 Encounter for supervision of other normal pregnancy, unspecified trimester: Secondary | ICD-10-CM

## 2021-02-23 DIAGNOSIS — Z3A36 36 weeks gestation of pregnancy: Secondary | ICD-10-CM

## 2021-02-23 DIAGNOSIS — O133 Gestational [pregnancy-induced] hypertension without significant proteinuria, third trimester: Secondary | ICD-10-CM

## 2021-02-23 LAB — HEMOGLOBIN A1C
Est. average glucose Bld gHb Est-mCnc: 114 mg/dL
Hgb A1c MFr Bld: 5.6 % (ref 4.8–5.6)

## 2021-02-23 NOTE — Patient Instructions (Signed)

## 2021-02-23 NOTE — Progress Notes (Signed)
   PRENATAL VISIT NOTE  Subjective:  Kimberly Farmer is a 25 y.o. [phone] at [redacted]w[redacted]d being seen today for ongoing prenatal care.  She is currently monitored for the following issues for this high-risk pregnancy and has Airway hyperreactivity; F/H of alcoholism; Osteochondritis dissecans of bilateral knees; Bone disease; Chronic midline low back pain without sciatica; BMI 37.0-37.9, adult; PCOS (polycystic ovarian syndrome); Choroid plexus cysts, fetal, affecting care of mother, antepartum; Supervision of other normal pregnancy, antepartum; Hx of preeclampsia, prior pregnancy, currently pregnant, first trimester; Previous cesarean delivery affecting pregnancy, antepartum; Lactating mother; Transient hypertension of pregnancy in third trimester; and Excessive fetal growth affecting management of mother in third trimester, antepartum on their problem list.  Patient reports no complaints.  Contractions: Not present. Vag. Bleeding: None.  Movement: Present. Denies leaking of fluid.  Patient has scheduled her repeat cesarean but is very stressed because her husband has not yet had the vacation time approved. Patient's mother is planning to provide child care and also has not secured   The following portions of the patient's history were reviewed and updated as appropriate: allergies, current medications, past family history, past medical history, past social history, past surgical history and problem list. Problem list updated.  Objective:   Vitals:   02/23/21 1400  BP: 106/72  Pulse: (!) 120    Fetal Status: Fetal Heart Rate (bpm): 146 Fundal Height: 36 cm Movement: Present     General:  Alert, oriented and cooperative. Patient is in no acute distress.  Skin: Skin is warm and dry. No rash noted.   Cardiovascular: Normal heart rate noted  Respiratory: Normal respiratory effort, no problems with respiration noted  Abdomen: Soft, gravid, appropriate for gestational age.  Pain/Pressure: Present      Pelvic Cervical exam performed      FT/thick/ballotable  Extremities: Normal range of motion.  Edema: Trace  Mental Status: Normal mood and affect. Normal behavior. Normal judgment and thought content.   Assessment and Plan:  Pregnancy: L2G4010 at [redacted]w[redacted]d  1. Supervision of other normal pregnancy, antepartum - Did not have GTT - Random POCT CBG = 69 - Hemoglobin A1c - Strep Gp B NAA - GC/Chlamydia probe amp ([place])not at [facility]  2. Obesity in pregnancy - Per MFM,. EFW 96% (6lb 2709 g) at 33w 2d  3. Lactating mother - Planning to tandem nurse  4. Transient hypertension of pregnancy in third trimester - Normotensive today  5. [redacted] weeks gestation of pregnancy   Preterm labor symptoms and general obstetric precautions including but not limited to vaginal bleeding, contractions, leaking of fluid and fetal movement were reviewed in detail with the patient. Please refer to After Visit Summary for other counseling recommendations.  Return in about 1 week (around 03/02/2021) for Virtual next visit.  Future Appointments  Date Time Provider Department Center  03/03/2021 11:45 AM Anyanwu, Jethro Bastos, MD [facility]    Calvert Cantor, CNM

## 2021-02-25 LAB — GC/CHLAMYDIA PROBE AMP (~~LOC~~) NOT AT ARMC
Chlamydia: NEGATIVE
Comment: NEGATIVE
Comment: NORMAL
Neisseria Gonorrhea: NEGATIVE

## 2021-02-25 LAB — STREP GP B NAA: Strep Gp B NAA: NEGATIVE

## 2021-02-25 NOTE — Patient Instructions (Signed)
TYWANA ROBOTHAM  02/25/2021   Your procedure is scheduled on:  03/18/2021  Arrive at 0730 at Entrance C on [address] at [facility]  and [employer]. You are invited to use the FREE valet parking or use the Visitor's parking deck.  Pick up the phone at the desk and dial [phone].  Call this number if you have problems the morning of surgery: [phone]  Remember:   Do not eat food:(After Midnight) Desps de medianoche.  Do not drink clear liquids: (After Midnight) Desps de medianoche.  Take these medicines the morning of surgery with A SIP OF WATER:  none   Do not wear jewelry, make-up or nail polish.  Do not wear lotions, powders, or perfumes. Do not wear deodorant.  Do not shave 48 hours prior to surgery.  Do not bring valuables to the hospital.  [facility] is not   responsible for any belongings or valuables brought to the hospital.  Contacts, dentures or bridgework may not be worn into surgery.  Leave suitcase in the car. After surgery it may be brought to your room.  For patients admitted to the hospital, checkout time is 11:00 AM the day of              discharge.      Please read over the following fact sheets that you were given:     Preparing for Surgery

## 2021-03-01 ENCOUNTER — Encounter (HOSPITAL_COMMUNITY): Payer: Self-pay

## 2021-03-01 ENCOUNTER — Inpatient Hospital Stay (HOSPITAL_COMMUNITY)
Admission: AD | Admit: 2021-03-01 | Discharge: 2021-03-05 | DRG: 788 | Disposition: A | Discharge status: Home / Self Care | Payer: Medicaid Other | Attending: Obstetrics and Gynecology | Admitting: Obstetrics and Gynecology

## 2021-03-01 ENCOUNTER — Other Ambulatory Visit: Payer: Self-pay

## 2021-03-01 ENCOUNTER — Encounter (HOSPITAL_COMMUNITY): Payer: Self-pay | Admitting: Family Medicine

## 2021-03-01 DIAGNOSIS — Z3A37 37 weeks gestation of pregnancy: Secondary | ICD-10-CM

## 2021-03-01 DIAGNOSIS — Z20822 Contact with and (suspected) exposure to covid-19: Secondary | ICD-10-CM | POA: Diagnosis present

## 2021-03-01 DIAGNOSIS — O9902 Anemia complicating childbirth: Secondary | ICD-10-CM | POA: Diagnosis present

## 2021-03-01 DIAGNOSIS — Z8759 Personal history of other complications of pregnancy, childbirth and the puerperium: Secondary | ICD-10-CM

## 2021-03-01 DIAGNOSIS — O403XX0 Polyhydramnios, third trimester, not applicable or unspecified: Secondary | ICD-10-CM | POA: Diagnosis present

## 2021-03-01 DIAGNOSIS — O34211 Maternal care for low transverse scar from previous cesarean delivery: Secondary | ICD-10-CM | POA: Diagnosis present

## 2021-03-01 DIAGNOSIS — Z3A36 36 weeks gestation of pregnancy: Secondary | ICD-10-CM | POA: Diagnosis not present

## 2021-03-01 DIAGNOSIS — O139 Gestational [pregnancy-induced] hypertension without significant proteinuria, unspecified trimester: Secondary | ICD-10-CM | POA: Diagnosis present

## 2021-03-01 DIAGNOSIS — Z23 Encounter for immunization: Secondary | ICD-10-CM

## 2021-03-01 DIAGNOSIS — O133 Gestational [pregnancy-induced] hypertension without significant proteinuria, third trimester: Secondary | ICD-10-CM

## 2021-03-01 DIAGNOSIS — O3663X0 Maternal care for excessive fetal growth, third trimester, not applicable or unspecified: Secondary | ICD-10-CM | POA: Diagnosis present

## 2021-03-01 DIAGNOSIS — O134 Gestational [pregnancy-induced] hypertension without significant proteinuria, complicating childbirth: Secondary | ICD-10-CM | POA: Diagnosis present

## 2021-03-01 DIAGNOSIS — O09291 Supervision of pregnancy with other poor reproductive or obstetric history, first trimester: Secondary | ICD-10-CM

## 2021-03-01 HISTORY — DX: Gestational (pregnancy-induced) hypertension without significant proteinuria, unspecified trimester: O13.9

## 2021-03-01 LAB — CBC
HCT: 34.2 % — ABNORMAL LOW (ref 36.0–46.0)
Hemoglobin: 10.5 g/dL — ABNORMAL LOW (ref 12.0–15.0)
MCH: 23.8 pg — ABNORMAL LOW (ref 26.0–34.0)
MCHC: 30.7 g/dL (ref 30.0–36.0)
MCV: 77.6 fL — ABNORMAL LOW (ref 80.0–100.0)
Platelets: 324 10*3/uL (ref 150–400)
RBC: 4.41 MIL/uL (ref 3.87–5.11)
RDW: 13.4 % (ref 11.5–15.5)
WBC: 12.3 10*3/uL — ABNORMAL HIGH (ref 4.0–10.5)
nRBC: 0 % (ref 0.0–0.2)

## 2021-03-01 LAB — PROTEIN / CREATININE RATIO, URINE
Creatinine, Urine: 33.47 mg/dL
Total Protein, Urine: <6 mg/dL

## 2021-03-01 LAB — COMPREHENSIVE METABOLIC PANEL
ALT: 11 U/L (ref 0–44)
AST: 12 U/L — ABNORMAL LOW (ref 15–41)
Albumin: 2.7 g/dL — ABNORMAL LOW (ref 3.5–5.0)
Alkaline Phosphatase: 114 U/L (ref 38–126)
Anion gap: 5 (ref 5–15)
BUN: 5 mg/dL — ABNORMAL LOW (ref 6–20)
CO2: 23 mmol/L (ref 22–32)
Calcium: 9 mg/dL (ref 8.9–10.3)
Chloride: 107 mmol/L (ref 98–111)
Creatinine, Ser: 0.5 mg/dL (ref 0.44–1.00)
GFR, Estimated: >60 mL/min (ref >60)
Glucose, Bld: 78 mg/dL (ref 70–99)
Potassium: 3.8 mmol/L (ref 3.5–5.1)
Sodium: 135 mmol/L (ref 135–145)
Total Bilirubin: 0.4 mg/dL (ref 0.3–1.2)
Total Protein: 5.9 g/dL — ABNORMAL LOW (ref 6.5–8.1)

## 2021-03-01 LAB — RESP PANEL BY RT-PCR (FLU A&B, COVID) ARPGX2
Influenza A by PCR: NEGATIVE
Influenza B by PCR: NEGATIVE
SARS Coronavirus 2 by RT PCR: NEGATIVE

## 2021-03-01 LAB — TYPE AND SCREEN
ABO/RH(D): O POS
Antibody Screen: NEGATIVE

## 2021-03-01 MED ORDER — LABETALOL HCL 5 MG/ML IV SOLN
INTRAVENOUS | Status: AC
  Filled 2021-03-01: qty 4

## 2021-03-01 MED ORDER — HYDRALAZINE HCL 20 MG/ML IJ SOLN: 10.0000 mg | INTRAMUSCULAR | Status: DC | PRN

## 2021-03-01 MED ORDER — LABETALOL HCL 5 MG/ML IV SOLN: 20.0000 mg | INTRAVENOUS | Status: DC | PRN

## 2021-03-01 MED ORDER — CALCIUM CARBONATE ANTACID 500 MG PO CHEW
2.0000 | CHEWABLE_TABLET | ORAL | Status: DC | PRN
  Administered 2021-03-01: 400 mg via ORAL
  Filled 2021-03-01: qty 2

## 2021-03-01 MED ORDER — ACETAMINOPHEN 325 MG PO TABS
650.0000 mg | ORAL_TABLET | ORAL | Status: DC | PRN
  Administered 2021-03-02: 650 mg via ORAL
  Filled 2021-03-01: qty 2

## 2021-03-01 MED ORDER — LABETALOL HCL 5 MG/ML IV SOLN: 40.0000 mg | INTRAVENOUS | Status: DC | PRN

## 2021-03-01 MED ORDER — LABETALOL HCL 5 MG/ML IV SOLN: 80.0000 mg | INTRAVENOUS | Status: DC | PRN

## 2021-03-01 MED ORDER — LABETALOL HCL 5 MG/ML IV SOLN
20.0000 mg | INTRAVENOUS | Status: DC | PRN
  Administered 2021-03-01: 10 mg via INTRAVENOUS

## 2021-03-01 MED ORDER — DOCUSATE SODIUM 100 MG PO CAPS
100.0000 mg | ORAL_CAPSULE | Freq: Every day | ORAL | Status: DC
  Administered 2021-03-03: 100 mg via ORAL
  Filled 2021-03-01: qty 1

## 2021-03-01 MED ORDER — ZOLPIDEM TARTRATE 5 MG PO TABS: 5.0000 mg | ORAL_TABLET | Freq: Every evening | ORAL | Status: DC | PRN

## 2021-03-01 MED ORDER — PRENATAL MULTIVITAMIN CH: 1.0000 | ORAL_TABLET | Freq: Every day | ORAL | Status: DC

## 2021-03-01 MED ORDER — HYDRALAZINE HCL 20 MG/ML IJ SOLN
10.0000 mg | INTRAMUSCULAR | Status: DC | PRN
Start: 2021-03-01 — End: 2021-03-01

## 2021-03-01 NOTE — MAU Note (Signed)
Pt reports braxton hicks for the last couple of days, have gotten worse today 12 min apart. 3/10. Pt having r/p CS so just wanted to make sure not in labor. Denies VB and LOF, reports +FM.

## 2021-03-01 NOTE — MAU Provider Note (Signed)
History     CSN: [id number]  Arrival date and time: 03/01/21 1902   None     Chief Complaint  Patient presents with  . Contractions   HPI   Patient is a g4p2012 at [redacted]w[redacted]d who presented to the MAU for contractions. Patient has a history of one prior cesarean section and plans to have another cesarean section. On arrival she had multiple elevated blood pressures. She had one single elevated pressure at an MFM appointment at 29 weeks, all other blood pressures had been normal in pregnancy. She reports a history of migraines with aura, but does not report any today. No RUQ pain, no SOB, no increased swelling.    Patient endorses significant anxiety re being at hospital and having difficulty arranging child care.  Reports mild contractions every ten minutes. No LOF. Good fetal movement.   OB History    Gravida  4   Para  2   Term  2   Preterm  0   AB  1   Living  2     SAB  1   IAB  0   Ectopic  0   Multiple  0   Live Births  2           Past Medical History:  Diagnosis Date  . Arthritis    Bilateral knees and back  . Breakthrough bleeding on birth control pills 05/16/2018  . Cesarean wound infection 03/18/2019  . Choroid plexus cysts, fetal, affecting care of mother, antepartum 09/17/2018  . Lumbar disc disease   . 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 PCOS (polycystic ovarian syndrome)   . Post-dates pregnancy 02/21/2019  . Pregnancy induced hypertension     Past Surgical History:  Procedure Laterality Date  . CESAREAN SECTION N/A 02/21/2019   Procedure: CESAREAN SECTION;  Surgeon: Nadara Mustard, MD;  Location: [facility] ORS;  Service: Obstetrics;  Laterality: N/A;  . KNEE SURGERY Left 2011   [name] has had 5 surgeries on her knee  . TONSILLECTOMY AND ADENOIDECTOMY  2007    Family History  Problem Relation Age of Onset  . Hypertension Mother   . Alcohol abuse Father   . Diabetes Maternal Grandmother   . Hypertension Maternal Grandmother   . Cancer Paternal  Grandfather        skin    Social History   Tobacco Use  . Smoking status: Never Smoker  . Smokeless tobacco: Never Used  . Tobacco comment: Vape - Reduced Nicotine  Vaping Use  . Vaping Use: Former  . Substances: Nicotine  Substance Use Topics  . Alcohol use: No  . Drug use: No    Allergies:  Allergies  Allergen Reactions  . Hydrocodone Other (See Comments)    Other Reaction: SEVERE HA & GI Upset  . Oxycodone Other (See Comments)    Other Reaction: SEVERE HA & GI Upset  . Tape Rash    Medications Prior to Admission  Medication Sig Dispense Refill Last Dose  . aspirin EC 81 MG tablet Take 1 tablet (81 mg total) by mouth daily. Take after 12 weeks for prevention of preeclampsia later in pregnancy 300 tablet 2 03/01/2021 at Unknown time  . Prenatal MV & Min w/FA-DHA (ONE A DAY PRENATAL PO) Take by mouth.   03/01/2021 at Unknown time  . b complex vitamins tablet Take 1 tablet by mouth daily. (Patient not taking: Reported on 08/25/2020) 90 tablet 1   . calcium carbonate (TUMS - DOSED IN MG ELEMENTAL CALCIUM)  500 MG chewable tablet Chew 1 tablet by mouth daily.     . cholecalciferol (VITAMIN D3) 25 MCG (1000 UNIT) tablet Take 1 tablet (1,000 Units total) by mouth daily. (Patient not taking: Reported on 08/25/2020) 90 tablet 1   . Multiple Vitamin (MULTI-VITAMIN PO) Take by mouth daily. (Patient not taking: Reported on 08/25/2020)     . phentermine (ADIPEX-P) 37.5 MG tablet Take 1 tablet (37.5 mg total) by mouth daily before breakfast. (Patient not taking: Reported on 08/25/2020) 30 tablet 2   . sucralfate (CARAFATE) 1 g tablet Take 1 tablet (1 g total) by mouth 4 (four) times daily -  with meals and at bedtime. (Patient not taking: Reported on 08/25/2020) 120 tablet 0   . topiramate (TOPAMAX) 50 MG tablet Take 1 tablet (50 mg total) by mouth 2 (two) times daily. (Patient not taking: Reported on 08/25/2020) 180 tablet 1     Review of Systems  Constitutional: Negative for activity  change, appetite change and chills.  Respiratory: Negative for chest tightness and shortness of breath.   Gastrointestinal: Negative for abdominal pain and vomiting.  Neurological: Negative for facial asymmetry, speech difficulty and headaches.   Physical Exam   Blood pressure (!) 147/87, pulse 98, temperature 98 F (36.7 C), temperature source Oral, resp. rate 18, height 5\' 6"  (1.676 m), weight 123 kg, SpO2 98 %, currently breastfeeding.  Physical Exam Vitals and nursing note reviewed.  Constitutional:      Appearance: Normal appearance.  Eyes:     Pupils: Pupils are equal, round, and reactive to light.  Cardiovascular:     Rate and Rhythm: Normal rate and regular rhythm.     Pulses: Normal pulses.  Pulmonary:     Effort: Pulmonary effort is normal.  Abdominal:     Palpations: Abdomen is soft.     Tenderness: There is no abdominal tenderness.  Neurological:     General: No focal deficit present.     Mental Status: She is alert and oriented to person, place, and time.  Psychiatric:        Mood and Affect: Mood normal.        Behavior: Behavior normal.     MAU Course  Procedures  MDM BP on arrival 154/91, 147/87 PEC labs drawn  Cervical exam 1cm, thick per RN   NST: baseline 135, mod var, pos accels, no decels, reactive Toco: not tracing contractions   Multiple severe range pressures documented, however patient was actively moving arm, talking, crying. On recheck still with elevated blood pressure. IV Labetalol given x1 and BP improved on monitoring.   PEC labs normal. Case discussed with Dr.   Assessment and Plan   Hypertensive disorder of pregnancy -not yet four hours apart, however at minimum will meet criteria for gestational HTN  -plan for observation on OB Specialty care and repeat cesarean section in AM.  -severe range pressures however improved on repeat. Will have low threshold to treat for PEC w SF based on blood pressure. Will monitor closely for  now.  -NPO at midnight   Donavan Foil 03/01/2021, 8:10 PM

## 2021-03-02 ENCOUNTER — Inpatient Hospital Stay (HOSPITAL_COMMUNITY): Payer: Medicaid Other | Admitting: Anesthesiology

## 2021-03-02 ENCOUNTER — Encounter (HOSPITAL_COMMUNITY)
Admission: AD | Disposition: A | Discharge status: Home / Self Care | Payer: Self-pay | Source: Home / Self Care | Attending: Obstetrics and Gynecology

## 2021-03-02 ENCOUNTER — Encounter (HOSPITAL_COMMUNITY): Payer: Self-pay | Admitting: Obstetrics & Gynecology

## 2021-03-02 DIAGNOSIS — O134 Gestational [pregnancy-induced] hypertension without significant proteinuria, complicating childbirth: Secondary | ICD-10-CM

## 2021-03-02 DIAGNOSIS — Z3A37 37 weeks gestation of pregnancy: Secondary | ICD-10-CM

## 2021-03-02 DIAGNOSIS — O403XX0 Polyhydramnios, third trimester, not applicable or unspecified: Secondary | ICD-10-CM

## 2021-03-02 DIAGNOSIS — O34211 Maternal care for low transverse scar from previous cesarean delivery: Secondary | ICD-10-CM

## 2021-03-02 LAB — RPR: RPR Ser Ql: NONREACTIVE

## 2021-03-02 SURGERY — Surgical Case
Anesthesia: Spinal | Site: Abdomen | Wound class: Clean Contaminated

## 2021-03-02 MED ORDER — LACTATED RINGERS IV SOLN: INTRAVENOUS | Status: DC

## 2021-03-02 MED ORDER — MORPHINE SULFATE (PF) 0.5 MG/ML IJ SOLN
INTRAMUSCULAR | Status: AC
  Filled 2021-03-02: qty 10

## 2021-03-02 MED ORDER — MENTHOL 3 MG MT LOZG: 1.0000 | LOZENGE | OROMUCOSAL | Status: DC | PRN

## 2021-03-02 MED ORDER — COCONUT OIL OIL: 1.0000 "application " | TOPICAL_OIL | Status: DC | PRN

## 2021-03-02 MED ORDER — DIBUCAINE (PERIANAL) 1 % EX OINT: 1.0000 "application " | TOPICAL_OINTMENT | CUTANEOUS | Status: DC | PRN

## 2021-03-02 MED ORDER — NALBUPHINE HCL 10 MG/ML IJ SOLN
INTRAMUSCULAR | Status: AC
  Filled 2021-03-02: qty 1

## 2021-03-02 MED ORDER — ZOLPIDEM TARTRATE 5 MG PO TABS: 5.0000 mg | ORAL_TABLET | Freq: Every evening | ORAL | Status: DC | PRN

## 2021-03-02 MED ORDER — KETOROLAC TROMETHAMINE 30 MG/ML IJ SOLN
30.0000 mg | Freq: Four times a day (QID) | INTRAMUSCULAR | Status: AC
  Administered 2021-03-02 – 2021-03-03 (×3): 30 mg via INTRAVENOUS
  Filled 2021-03-02 (×3): qty 1

## 2021-03-02 MED ORDER — SCOPOLAMINE 1 MG/3DAYS TD PT72: 1.0000 | MEDICATED_PATCH | Freq: Once | TRANSDERMAL | Status: DC

## 2021-03-02 MED ORDER — NALBUPHINE HCL 10 MG/ML IJ SOLN
5.0000 mg | INTRAMUSCULAR | Status: DC | PRN
Start: 2021-03-02 — End: 2021-03-05
  Administered 2021-03-02: 5 mg via INTRAVENOUS
  Filled 2021-03-02: qty 1

## 2021-03-02 MED ORDER — SODIUM CHLORIDE 0.9% FLUSH: 3.0000 mL | INTRAVENOUS | Status: DC | PRN

## 2021-03-02 MED ORDER — ACETAMINOPHEN 500 MG PO TABS
1000.0000 mg | ORAL_TABLET | Freq: Four times a day (QID) | ORAL | Status: DC
  Administered 2021-03-02 – 2021-03-03 (×4): 1000 mg via ORAL
  Administered 2021-03-03: 500 mg via ORAL
  Administered 2021-03-04 – 2021-03-05 (×8): 1000 mg via ORAL
  Filled 2021-03-02 (×13): qty 2

## 2021-03-02 MED ORDER — PHENYLEPHRINE HCL-NACL 20-0.9 MG/250ML-% IV SOLN
INTRAVENOUS | Status: AC
  Filled 2021-03-02: qty 250

## 2021-03-02 MED ORDER — TRANEXAMIC ACID-NACL 1000-0.7 MG/100ML-% IV SOLN
1000.0000 mg | INTRAVENOUS | Status: AC
  Administered 2021-03-02: 1000 mg via INTRAVENOUS

## 2021-03-02 MED ORDER — ENOXAPARIN SODIUM 60 MG/0.6ML IJ SOSY
60.0000 mg | PREFILLED_SYRINGE | INTRAMUSCULAR | Status: DC
  Administered 2021-03-03 – 2021-03-04 (×2): 60 mg via SUBCUTANEOUS
  Filled 2021-03-02 (×2): qty 0.6

## 2021-03-02 MED ORDER — ONDANSETRON HCL 4 MG/2ML IJ SOLN
INTRAMUSCULAR | Status: DC | PRN
  Administered 2021-03-02: 4 mg via INTRAVENOUS

## 2021-03-02 MED ORDER — KETOROLAC TROMETHAMINE 30 MG/ML IJ SOLN
30.0000 mg | Freq: Once | INTRAMUSCULAR | Status: AC | PRN
  Administered 2021-03-02: 30 mg via INTRAVENOUS

## 2021-03-02 MED ORDER — SENNOSIDES-DOCUSATE SODIUM 8.6-50 MG PO TABS
2.0000 | ORAL_TABLET | Freq: Every day | ORAL | Status: DC
  Administered 2021-03-03: 2 via ORAL
  Filled 2021-03-02: qty 2

## 2021-03-02 MED ORDER — MEPERIDINE HCL 25 MG/ML IJ SOLN: 6.2500 mg | INTRAMUSCULAR | Status: DC | PRN

## 2021-03-02 MED ORDER — FENTANYL CITRATE (PF) 100 MCG/2ML IJ SOLN: 25.0000 ug | INTRAMUSCULAR | Status: DC | PRN

## 2021-03-02 MED ORDER — MAGNESIUM HYDROXIDE 400 MG/5ML PO SUSP: 30.0000 mL | ORAL | Status: DC | PRN

## 2021-03-02 MED ORDER — SOD CITRATE-CITRIC ACID 500-334 MG/5ML PO SOLN
ORAL | Status: AC
  Administered 2021-03-02: 30 mL
  Filled 2021-03-02: qty 15

## 2021-03-02 MED ORDER — SODIUM CHLORIDE 0.9 % IV SOLN
2.0000 g | INTRAVENOUS | Status: DC
  Filled 2021-03-02: qty 2

## 2021-03-02 MED ORDER — OXYCODONE-ACETAMINOPHEN 5-325 MG PO TABS: 2.0000 | ORAL_TABLET | ORAL | Status: DC | PRN

## 2021-03-02 MED ORDER — SODIUM CHLORIDE 0.9 % IV SOLN
INTRAVENOUS | Status: DC | PRN
  Administered 2021-03-02: 2 g via INTRAVENOUS

## 2021-03-02 MED ORDER — WITCH HAZEL-GLYCERIN EX PADS: 1.0000 "application " | MEDICATED_PAD | CUTANEOUS | Status: DC | PRN

## 2021-03-02 MED ORDER — PHENYLEPHRINE 40 MCG/ML (10ML) SYRINGE FOR IV PUSH (FOR BLOOD PRESSURE SUPPORT)
PREFILLED_SYRINGE | INTRAVENOUS | Status: AC
  Filled 2021-03-02: qty 10

## 2021-03-02 MED ORDER — PRENATAL MULTIVITAMIN CH
1.0000 | ORAL_TABLET | Freq: Every day | ORAL | Status: DC
  Administered 2021-03-03 – 2021-03-05 (×3): 1 via ORAL
  Filled 2021-03-02 (×3): qty 1

## 2021-03-02 MED ORDER — DIPHENHYDRAMINE HCL 50 MG/ML IJ SOLN: 12.5000 mg | Freq: Four times a day (QID) | INTRAMUSCULAR | Status: DC | PRN

## 2021-03-02 MED ORDER — DIPHENHYDRAMINE HCL 25 MG PO CAPS: 25.0000 mg | ORAL_CAPSULE | Freq: Four times a day (QID) | ORAL | Status: DC | PRN

## 2021-03-02 MED ORDER — PHENYLEPHRINE 40 MCG/ML (10ML) SYRINGE FOR IV PUSH (FOR BLOOD PRESSURE SUPPORT)
PREFILLED_SYRINGE | INTRAVENOUS | Status: DC | PRN
  Administered 2021-03-02 (×4): 80 ug via INTRAVENOUS

## 2021-03-02 MED ORDER — NALBUPHINE HCL 10 MG/ML IJ SOLN
5.0000 mg | Freq: Once | INTRAMUSCULAR | Status: AC | PRN
  Administered 2021-03-02: 5 mg via INTRAVENOUS

## 2021-03-02 MED ORDER — ONDANSETRON HCL 4 MG/2ML IJ SOLN: 4.0000 mg | Freq: Three times a day (TID) | INTRAMUSCULAR | Status: DC | PRN

## 2021-03-02 MED ORDER — SODIUM CHLORIDE 0.9 % IV SOLN
INTRAVENOUS | Status: AC
  Filled 2021-03-02: qty 2

## 2021-03-02 MED ORDER — OXYTOCIN-SODIUM CHLORIDE 30-0.9 UT/500ML-% IV SOLN
INTRAVENOUS | Status: DC | PRN
  Administered 2021-03-02: 30 [IU] via INTRAVENOUS

## 2021-03-02 MED ORDER — PROMETHAZINE HCL 25 MG/ML IJ SOLN: 6.2500 mg | INTRAMUSCULAR | Status: DC | PRN

## 2021-03-02 MED ORDER — AMISULPRIDE (ANTIEMETIC) 5 MG/2ML IV SOLN: 10.0000 mg | Freq: Once | INTRAVENOUS | Status: DC | PRN

## 2021-03-02 MED ORDER — GABAPENTIN 300 MG PO CAPS
300.0000 mg | ORAL_CAPSULE | Freq: Two times a day (BID) | ORAL | Status: DC
  Administered 2021-03-02 – 2021-03-05 (×6): 300 mg via ORAL
  Filled 2021-03-02 (×6): qty 1

## 2021-03-02 MED ORDER — NALBUPHINE HCL 10 MG/ML IJ SOLN: 5.0000 mg | Freq: Once | INTRAMUSCULAR | Status: AC | PRN

## 2021-03-02 MED ORDER — NALBUPHINE HCL 10 MG/ML IJ SOLN: 5.0000 mg | INTRAMUSCULAR | Status: DC | PRN

## 2021-03-02 MED ORDER — MEASLES, MUMPS & RUBELLA VAC IJ SOLR: 0.5000 mL | Freq: Once | INTRAMUSCULAR | Status: DC

## 2021-03-02 MED ORDER — FENTANYL CITRATE (PF) 100 MCG/2ML IJ SOLN
INTRAMUSCULAR | Status: DC | PRN
  Administered 2021-03-02: 15 ug via INTRATHECAL

## 2021-03-02 MED ORDER — TRANEXAMIC ACID-NACL 1000-0.7 MG/100ML-% IV SOLN
INTRAVENOUS | Status: AC
  Filled 2021-03-02: qty 100

## 2021-03-02 MED ORDER — SIMETHICONE 80 MG PO CHEW: 80.0000 mg | CHEWABLE_TABLET | ORAL | Status: DC | PRN

## 2021-03-02 MED ORDER — FENTANYL CITRATE (PF) 100 MCG/2ML IJ SOLN
INTRAMUSCULAR | Status: AC
  Filled 2021-03-02: qty 2

## 2021-03-02 MED ORDER — ONDANSETRON HCL 4 MG/2ML IJ SOLN
INTRAMUSCULAR | Status: AC
  Filled 2021-03-02: qty 2

## 2021-03-02 MED ORDER — DIPHENHYDRAMINE HCL 25 MG PO CAPS: 25.0000 mg | ORAL_CAPSULE | ORAL | Status: DC | PRN

## 2021-03-02 MED ORDER — IBUPROFEN 600 MG PO TABS
600.0000 mg | ORAL_TABLET | Freq: Four times a day (QID) | ORAL | Status: DC
  Administered 2021-03-03 – 2021-03-05 (×9): 600 mg via ORAL
  Filled 2021-03-02 (×9): qty 1

## 2021-03-02 MED ORDER — OXYTOCIN-SODIUM CHLORIDE 30-0.9 UT/500ML-% IV SOLN
INTRAVENOUS | Status: AC
  Filled 2021-03-02: qty 500

## 2021-03-02 MED ORDER — SODIUM CHLORIDE 0.9 % IV SOLN: INTRAVENOUS | Status: DC | PRN

## 2021-03-02 MED ORDER — MORPHINE SULFATE (PF) 0.5 MG/ML IJ SOLN
INTRAMUSCULAR | Status: DC | PRN
  Administered 2021-03-02: 150 ug via EPIDURAL

## 2021-03-02 MED ORDER — NALOXONE HCL 0.4 MG/ML IJ SOLN: 0.4000 mg | INTRAMUSCULAR | Status: DC | PRN

## 2021-03-02 MED ORDER — KETOROLAC TROMETHAMINE 30 MG/ML IJ SOLN
INTRAMUSCULAR | Status: AC
  Filled 2021-03-02: qty 1

## 2021-03-02 MED ORDER — BUPIVACAINE IN DEXTROSE 0.75-8.25 % IT SOLN
INTRATHECAL | Status: DC | PRN
  Administered 2021-03-02: 1.6 mL via INTRATHECAL

## 2021-03-02 MED ORDER — OXYCODONE HCL 5 MG PO TABS: 5.0000 mg | ORAL_TABLET | ORAL | Status: DC | PRN

## 2021-03-02 MED ORDER — OXYTOCIN-SODIUM CHLORIDE 30-0.9 UT/500ML-% IV SOLN: 2.5000 [IU]/h | INTRAVENOUS | Status: AC

## 2021-03-02 MED ORDER — SOD CITRATE-CITRIC ACID 500-334 MG/5ML PO SOLN: 30.0000 mL | Freq: Once | ORAL | Status: AC

## 2021-03-02 MED ORDER — FERROUS SULFATE 325 (65 FE) MG PO TABS
325.0000 mg | ORAL_TABLET | Freq: Two times a day (BID) | ORAL | Status: DC
  Administered 2021-03-03 – 2021-03-05 (×5): 325 mg via ORAL
  Filled 2021-03-02 (×5): qty 1

## 2021-03-02 MED ORDER — PHENYLEPHRINE HCL-NACL 20-0.9 MG/250ML-% IV SOLN
INTRAVENOUS | Status: DC | PRN
  Administered 2021-03-02: 60 ug/min via INTRAVENOUS

## 2021-03-02 MED ORDER — HYDROMORPHONE HCL 1 MG/ML IJ SOLN: 1.0000 mg | INTRAMUSCULAR | Status: DC | PRN

## 2021-03-02 MED ORDER — NALOXONE HCL 4 MG/10ML IJ SOLN
1.0000 ug/kg/h | INTRAVENOUS | Status: DC | PRN
  Filled 2021-03-02: qty 5

## 2021-03-02 MED ORDER — TETANUS-DIPHTH-ACELL PERTUSSIS 5-2.5-18.5 LF-MCG/0.5 IM SUSY
0.5000 mL | PREFILLED_SYRINGE | Freq: Once | INTRAMUSCULAR | Status: AC
  Administered 2021-03-03: 0.5 mL via INTRAMUSCULAR
  Filled 2021-03-02: qty 0.5

## 2021-03-02 SURGICAL SUPPLY — 35 items
BENZOIN TINCTURE PRP APPL 2/3 (GAUZE/BANDAGES/DRESSINGS) ×2 IMPLANT
CHLORAPREP W/TINT 26ML (MISCELLANEOUS) ×2 IMPLANT
CLAMP CORD UMBIL (MISCELLANEOUS) IMPLANT
CLIP FILSHIE TUBAL LIGA STRL (Clip) IMPLANT
CLOSURE STERI-STRIP 1/4X4 (GAUZE/BANDAGES/DRESSINGS) ×2 IMPLANT
CLOTH BEACON ORANGE TIMEOUT ST (SAFETY) ×2 IMPLANT
DRSG OPSITE POSTOP 4X10 (GAUZE/BANDAGES/DRESSINGS) ×2 IMPLANT
ELECT REM PT RETURN 9FT ADLT (ELECTROSURGICAL) ×2
ELECTRODE REM PT RTRN 9FT ADLT (ELECTROSURGICAL) ×1 IMPLANT
EXTRACTOR VACUUM M CUP 4 TUBE (SUCTIONS) IMPLANT
GAUZE SPONGE 4X4 12PLY STRL (GAUZE/BANDAGES/DRESSINGS) ×4 IMPLANT
GLOVE BIOGEL PI IND STRL 7.0 (GLOVE) ×3 IMPLANT
GLOVE BIOGEL PI INDICATOR 7.0 (GLOVE) ×3
GLOVE ECLIPSE 7.0 STRL STRAW (GLOVE) ×2 IMPLANT
GOWN STRL REUS W/TWL LRG LVL3 (GOWN DISPOSABLE) ×4 IMPLANT
HEMOSTAT SURGICEL 4X8 (HEMOSTASIS) ×2 IMPLANT
KIT ABG SYR 3ML LUER SLIP (SYRINGE) IMPLANT
NEEDLE HYPO 22GX1.5 SAFETY (NEEDLE) ×2 IMPLANT
NEEDLE HYPO 25X5/8 SAFETYGLIDE (NEEDLE) ×2 IMPLANT
NS IRRIG 1000ML POUR BTL (IV SOLUTION) ×2 IMPLANT
PACK C SECTION WH (CUSTOM PROCEDURE TRAY) ×2 IMPLANT
PAD ABD 7.5X8 STRL (GAUZE/BANDAGES/DRESSINGS) ×2 IMPLANT
PAD OB MATERNITY 4.3X12.25 (PERSONAL CARE ITEMS) ×2 IMPLANT
PENCIL SMOKE EVAC W/HOLSTER (ELECTROSURGICAL) ×2 IMPLANT
RTRCTR C-SECT PINK 25CM LRG (MISCELLANEOUS) IMPLANT
SUT PDS AB 0 CTX 36 PDP370T (SUTURE) IMPLANT
SUT PDS AB 0 CTX 60 (SUTURE) ×2 IMPLANT
SUT PLAIN 2 0 XLH (SUTURE) ×2 IMPLANT
SUT VIC AB 0 CTX 36 (SUTURE) ×2
SUT VIC AB 0 CTX36XBRD ANBCTRL (SUTURE) ×2 IMPLANT
SUT VIC AB 4-0 KS 27 (SUTURE) ×2 IMPLANT
SYR CONTROL 10ML LL (SYRINGE) ×2 IMPLANT
TOWEL OR 17X24 6PK STRL BLUE (TOWEL DISPOSABLE) ×2 IMPLANT
TRAY FOLEY W/BAG SLVR 14FR LF (SET/KITS/TRAYS/PACK) ×2 IMPLANT
WATER STERILE IRR 1000ML POUR (IV SOLUTION) ×2 IMPLANT

## 2021-03-02 NOTE — H&P (View-Only) (Signed)
FACULTY PRACTICE ANTEPARTUM PROGRESS NOTE  Kimberly Farmer is a 25 y.o. [phone] at [redacted]w[redacted]d who is admitted for gestational hypertension.  Estimated Date of Delivery: 03/23/21 Fetal presentation is unsure.  Length of Stay:  1 Days. Admitted 03/01/2021  Subjective: Pt doing well this AM, currently NPO.  She denies HA, visual changes and RUQ pain Patient reports normal fetal movement.  She denies uterine contractions, denies bleeding and leaking of fluid per vagina.  Vitals:  Blood pressure (!) 148/91, pulse 94, temperature 97.8 F (36.6 C), temperature source Oral, resp. rate 18, height 5\' 6"  (1.676 m), weight 123 kg, SpO2 99 %, currently breastfeeding. Physical Examination: CONSTITUTIONAL: Well-developed, well-nourished female in no acute distress.  HENT:  Normocephalic, atraumatic, External right and left ear normal. Oropharynx is clear and moist EYES: Conjunctivae and EOM are normal.  No scleral icterus.  NECK: Normal range of motion, supple, no masses. SKIN: Skin is warm and dry. No rash noted. Not diaphoretic. No erythema. No pallor. NEUROLGIC: Alert and oriented to person, place, and time. Normal reflexes, muscle tone coordination. No cranial nerve deficit noted. PSYCHIATRIC: Normal mood and affect. Normal behavior. Normal judgment and thought content. CARDIOVASCULAR: Normal heart rate noted, regular rhythm RESPIRATORY: Effort and breath sounds normal, no problems with respiration noted MUSCULOSKELETAL: Normal range of motion. No edema and no tenderness. ABDOMEN: Soft, nontender, nondistended, gravid, obese CERVIX: deferred  Fetal monitoring: FHR: 120s bpm, Variability: marked, Accelerations: Present, Decelerations: Absent  Uterine activity: irregular, rare  Results for orders placed or performed during the hospital encounter of 03/01/21 (from the past 48 hour(s))  Protein / creatinine ratio, urine     Status: None   Collection Time: 03/01/21  8:04 PM  Result Value Ref Range    Creatinine, Urine 33.47 mg/dL   Total Protein, Urine <6 mg/dL    Comment: NO NORMAL RANGE ESTABLISHED FOR THIS TEST NO NORMAL RANGE ESTABLISHED FOR THIS TEST    Protein Creatinine Ratio        0.00 - 0.15 mg/mg[Cre]    Comment: RESULT BELOW REPORTABLE RANGE, UNABLE TO CALCULATE. Performed at [facility], [address] [place], [place]   CBC     Status: Abnormal   Collection Time: 03/01/21  8:15 PM  Result Value Ref Range   WBC 12.3 (H) 4.0 - 10.5 K/uL   RBC 4.41 3.87 - 5.11 MIL/uL   Hemoglobin 10.5 (L) 12.0 - 15.0 g/dL   HCT 03/03/21 (L) 42.6 - 83.4 %   MCV 77.6 (L) 80.0 - 100.0 fL   MCH 23.8 (L) 26.0 - 34.0 pg   MCHC 30.7 30.0 - 36.0 g/dL   RDW 19.6 22.2 - 97.9 %   Platelets 324 150 - 400 K/uL   nRBC 0.0 0.0 - 0.2 %    Comment: Performed at [facility], [address] [place], [place]  Comprehensive metabolic panel     Status: Abnormal   Collection Time: 03/01/21  8:15 PM  Result Value Ref Range   Sodium 135 135 - 145 mmol/L   Potassium 3.8 3.5 - 5.1 mmol/L   Chloride 107 98 - 111 mmol/L   CO2 23 22 - 32 mmol/L   Glucose, Bld 78 70 - 99 mg/dL    Comment: Glucose reference range applies only to samples taken after fasting for at least 8 hours.   BUN 5 (L) 6 - 20 mg/dL   Creatinine, Ser 03/03/21 0.44 - 1.00 mg/dL   Calcium 9.0 8.9 -  10.3 mg/dL   Total Protein 5.9 (L) 6.5 - 8.1 g/dL   Albumin 2.7 (L) 3.5 - 5.0 g/dL   AST 12 (L) 15 - 41 U/L   ALT 11 0 - 44 U/L   Alkaline Phosphatase 114 38 - 126 U/L   Total Bilirubin 0.4 0.3 - 1.2 mg/dL   GFR, Estimated >35 >68 mL/min    Comment: (NOTE) Calculated using the CKD-EPI Creatinine Equation (2021)    Anion gap 5 5 - 15    Comment: Performed at [facility], [address], [place], [place] [address]  Type and screen [facility]     Status: None   Collection Time: 03/01/21  9:40 PM  Result Value Ref Range   ABO/RH(D) O POS    Antibody Screen NEG    Sample Expiration       03/04/2021,2359 Performed at [facility], [address], [place], [place] [address]   Resp Panel by RT-PCR (Flu A&B, Covid) Nasopharyngeal Swab     Status: None   Collection Time: 03/01/21  9:40 PM   Specimen: Nasopharyngeal Swab; Nasopharyngeal(NP) swabs in vial transport medium  Result Value Ref Range   SARS Coronavirus 2 by RT PCR NEGATIVE NEGATIVE    Comment: (NOTE) SARS-CoV-2 target nucleic acids are NOT DETECTED.  The SARS-CoV-2 RNA is generally detectable in upper respiratory specimens during the acute phase of infection. The lowest concentration of SARS-CoV-2 viral copies this assay can detect is 138 copies/mL. A negative result does not preclude SARS-Cov-2 infection and should not be used as the sole basis for treatment or other patient management decisions. A negative result may occur with  improper specimen collection/handling, submission of specimen other than nasopharyngeal swab, presence of viral mutation(s) within the areas targeted by this assay, and inadequate number of viral copies(<138 copies/mL). A negative result must be combined with clinical observations, patient history, and epidemiological information. The expected result is Negative.  Fact Sheet for Patients:  BloggerCourse.com  Fact Sheet for Healthcare Providers:  SeriousBroker.it  This test is no t yet approved or cleared by the [place] FDA and  has been authorized for detection and/or diagnosis of SARS-CoV-2 by FDA under an Emergency Use Authorization (EUA). This EUA will remain  in effect (meaning this test can be used) for the duration of the COVID-19 declaration under Section 564(b)(1) of the Act, 21 U.S.C.section 360bbb-3(b)(1), unless the authorization is terminated  or revoked sooner.       Influenza A by PCR NEGATIVE NEGATIVE   Influenza B by PCR NEGATIVE NEGATIVE    Comment: (NOTE) The Xpert Xpress SARS-CoV-2/FLU/RSV plus  assay is intended as an aid in the diagnosis of influenza from Nasopharyngeal swab specimens and should not be used as a sole basis for treatment. Nasal washings and aspirates are unacceptable for Xpert Xpress SARS-CoV-2/FLU/RSV testing.  Fact Sheet for Patients: BloggerCourse.com  Fact Sheet for Healthcare Providers: SeriousBroker.it  This test is not yet approved or cleared by the [place] FDA and has been authorized for detection and/or diagnosis of SARS-CoV-2 by FDA under an Emergency Use Authorization (EUA). This EUA will remain in effect (meaning this test can be used) for the duration of the COVID-19 declaration under Section 564(b)(1) of the Act, 21 U.S.C. section 360bbb-3(b)(1), unless the authorization is terminated or revoked.  Performed at [facility], [address] [place], [place] [address]     I have reviewed the patient's current medications.  ASSESSMENT: Active  Problems:   Gestational hypertension   PLAN: Plan for delivery by repeat cesarean section. Pt currently NPO BP is labile, no current severe range pressures.   Mariel Aloe, MD [facility] Faculty Attending, Center for [facility] 03/02/2021 7:42 AM

## 2021-03-02 NOTE — Anesthesia Procedure Notes (Signed)
Spinal  Patient location during procedure: OR Start time: 03/02/2021 1:25 PM End time: 03/02/2021 1:30 PM Reason for block: surgical anesthesia Staffing Performed: anesthesiologist  Anesthesiologist: Leonides Grills, MD Preanesthetic Checklist Completed: patient identified, IV checked, risks and benefits discussed, surgical consent, monitors and equipment checked, pre-op evaluation and timeout performed Spinal Block Patient position: sitting Prep: DuraPrep Patient monitoring: cardiac monitor, continuous pulse ox and blood pressure Approach: midline Location: L3-4 Injection technique: single-shot Needle Needle type: Pencan  Needle gauge: 24 G Needle length: 9 cm Assessment Sensory level: T10 Events: CSF return Additional Notes Functioning IV was confirmed and monitors were applied. Sterile prep and drape, including hand hygiene and sterile gloves were used. The patient was positioned and the spine was prepped. The skin was anesthetized with lidocaine.  Free flow of clear CSF was obtained on the second attempt prior to injecting local anesthetic into the CSF. The spinal needle aspirated freely following injection.  The needle was carefully withdrawn.  The patient tolerated the procedure well.

## 2021-03-02 NOTE — Interval H&P Note (Signed)
History and Physical Interval Note 03/02/2021 12:24 PM  Kimberly Farmer  has presented today for repeat cesarean section, with the diagnosis of previous cesarean section; GHTN; LGA/polyhydramnios at [redacted] weeks gestation. Declines trial of labor.  Stable BP since admission, Category 1 FHR tracing, no severe features.  The risks of surgery were discussed with the patient including but were not limited to: bleeding which may require transfusion or reoperation; infection which may require antibiotics; injury to bowel, bladder, ureters or other surrounding organs; injury to the fetus; need for additional procedures including hysterectomy in the event of a life-threatening hemorrhage; formation of adhesions; placental abnormalities with subsequent pregnancies; incisional problems; thromboembolic phenomenon and other postoperative/anesthesia complications.  The patient concurred with the proposed plan, giving informed written consent for the procedure.  The patient's history has been reviewed, patient examined, no change in status, stable for surgery.  I have reviewed the patient's chart and labs.  Questions were answered to the patient's satisfaction.   Patient has been NPO since midnight she will remain NPO for procedure. Anesthesia and OR aware. Preoperative prophylactic antibiotics and SCDs ordered on call to the OR.  To OR when ready.   Jaynie Collins, MD, FACOG Obstetrician & Gynecologist, [facility] for [employer], [facility]

## 2021-03-02 NOTE — Op Note (Signed)
Kimberly Farmer PROCEDURE DATE: 03/02/2021  PREOPERATIVE DIAGNOSES: Intrauterine pregnancy at [redacted]w[redacted]d weeks gestation; gestational hypertension; previous cesarean section; large for gestational age; polyhydramnios; declines trial of labor  POSTOPERATIVE DIAGNOSES: The same  PROCEDURE: Repeat Low Transverse Cesarean Section  SURGEON:  Dr. Jaynie Collins  ASSISTANT:  Dr. Lynnda Shields  ANESTHESIOLOGY TEAM: Anesthesiologist: Leonides Grills, MD CRNA: Renford Dills, CRNA  INDICATIONS: Kimberly Farmer is a 25 y.o. [phone] at [redacted]w[redacted]d here for cesarean section secondary to the indications listed under preoperative diagnoses; please see preoperative note for further details.  The risks of surgery were discussed with the patient including but were not limited to: bleeding which may require transfusion or reoperation; infection which may require antibiotics; injury to bowel, bladder, ureters or other surrounding organs; injury to the fetus; need for additional procedures including hysterectomy in the event of a life-threatening hemorrhage; formation of adhesions; placental abnormalities wth subsequent pregnancies; incisional problems; thromboembolic phenomenon and other postoperative/anesthesia complications.  The patient concurred with the proposed plan, giving informed written consent for the procedure.    FINDINGS:  Viable female infant in cephalic presentation.  Loose nuchal cord x 1. Apgars pending at the time of this note.  Copious amount of clear amniotic fluid.  Intact placenta, three vessel cord.  Normal uterus, fallopian tubes and ovaries bilaterally. No intraperitoneal adhesive disease seen.  Of note, neonate did have problems maintaining oxygen saturations after birth, needed supplemental oxygen, and had to be taken to NICU for observation.  ANESTHESIA: Spinal INTRAVENOUS FLUIDS: 1900 ml   ESTIMATED BLOOD LOSS: 413 ml URINE OUTPUT:  50 ml SPECIMENS: Placenta sent to L&D COMPLICATIONS: None  immediate  PROCEDURE IN DETAIL:  The patient preoperatively received intravenous antibiotics and had sequential compression devices applied to her lower extremities.  She was then taken to the operating room where spinal anesthesia was administered and was found to be adequate. She was then placed in a dorsal supine position with a leftward tilt, and prepped and draped in a sterile manner.  A foley catheter was placed into her bladder and attached to constant gravity.  After an adequate timeout was performed, a Pfannenstiel skin incision was made with scalpel on her preexisting scar and carried through to the underlying layer of fascia. The fascia was incised in the midline, and this incision was extended bilaterally using the Mayo scissors.  Kocher clamps were applied to the superior aspect of the fascial incision and the underlying rectus muscles were dissected off bluntly and sharply.  A similar process was carried out on the inferior aspect of the fascial incision. The rectus muscles were separated in the midline and the peritoneum was entered bluntly. The Alexis self-retaining retractor was introduced into the abdominal cavity.  Attention was turned to the lower uterine segment where a low transverse hysterotomy was made with a scalpel and extended bilaterally bluntly.  The infant was successfully delivered, the cord was clamped and cut after one minute, and the infant was handed over to the awaiting neonatology team. Uterine massage was then administered, and the placenta delivered intact with a three-vessel cord. The uterus was then cleared of clots and debris.  The hysterotomy was closed with 0 Vicryl in a running locked fashion, and an imbricating layer was also placed with 0 Vicryl.  Figure-of-eight 0 Vicryl serosal stitches were placed to help with hemostasis.  The pelvis was cleared of all clot and debris. Hemostasis was confirmed on all surfaces.  The retractor was removed.  The peritoneum  was closed  with a 0 Vicryl running stitch. The fascia was then closed using 0 PDS in a running fashion.  The subcutaneous layer was irrigated, reapproximated with 2-0 plain gut interrupted stitches, and the skin was closed with a 4-0 Vicryl subcuticular stitch. The patient tolerated the procedure well. Sponge, instrument and needle counts were correct x 3.  She was taken to the recovery room in stable condition.    Jaynie Collins, MD, FACOG Obstetrician & Gynecologist, [facility] for [employer], [facility]

## 2021-03-02 NOTE — Progress Notes (Signed)
FACULTY PRACTICE ANTEPARTUM PROGRESS NOTE  Kimberly Farmer is a 25 y.o. [phone] at [redacted]w[redacted]d who is admitted for gestational hypertension.  Estimated Date of Delivery: 03/23/21 Fetal presentation is unsure.  Length of Stay:  1 Days. Admitted 03/01/2021  Subjective: Pt doing well this AM, currently NPO.  She denies HA, visual changes and RUQ pain Patient reports normal fetal movement.  She denies uterine contractions, denies bleeding and leaking of fluid per vagina.  Vitals:  Blood pressure (!) 148/91, pulse 94, temperature 97.8 F (36.6 C), temperature source Oral, resp. rate 18, height 5\' 6"  (1.676 m), weight 123 kg, SpO2 99 %, currently breastfeeding. Physical Examination: CONSTITUTIONAL: Well-developed, well-nourished female in no acute distress.  HENT:  Normocephalic, atraumatic, External right and left ear normal. Oropharynx is clear and moist EYES: Conjunctivae and EOM are normal.  No scleral icterus.  NECK: Normal range of motion, supple, no masses. SKIN: Skin is warm and dry. No rash noted. Not diaphoretic. No erythema. No pallor. NEUROLGIC: Alert and oriented to person, place, and time. Normal reflexes, muscle tone coordination. No cranial nerve deficit noted. PSYCHIATRIC: Normal mood and affect. Normal behavior. Normal judgment and thought content. CARDIOVASCULAR: Normal heart rate noted, regular rhythm RESPIRATORY: Effort and breath sounds normal, no problems with respiration noted MUSCULOSKELETAL: Normal range of motion. No edema and no tenderness. ABDOMEN: Soft, nontender, nondistended, gravid, obese CERVIX: deferred  Fetal monitoring: FHR: 120s bpm, Variability: marked, Accelerations: Present, Decelerations: Absent  Uterine activity: irregular, rare  Results for orders placed or performed during the hospital encounter of 03/01/21 (from the past 48 hour(s))  Protein / creatinine ratio, urine     Status: None   Collection Time: 03/01/21  8:04 PM  Result Value Ref Range    Creatinine, Urine 33.47 mg/dL   Total Protein, Urine <6 mg/dL    Comment: NO NORMAL RANGE ESTABLISHED FOR THIS TEST NO NORMAL RANGE ESTABLISHED FOR THIS TEST    Protein Creatinine Ratio        0.00 - 0.15 mg/mg[Cre]    Comment: RESULT BELOW REPORTABLE RANGE, UNABLE TO CALCULATE. Performed at [facility], [address] [place], [place]   CBC     Status: Abnormal   Collection Time: 03/01/21  8:15 PM  Result Value Ref Range   WBC 12.3 (H) 4.0 - 10.5 K/uL   RBC 4.41 3.87 - 5.11 MIL/uL   Hemoglobin 10.5 (L) 12.0 - 15.0 g/dL   HCT 03/03/21 (L) 42.6 - 83.4 %   MCV 77.6 (L) 80.0 - 100.0 fL   MCH 23.8 (L) 26.0 - 34.0 pg   MCHC 30.7 30.0 - 36.0 g/dL   RDW 19.6 22.2 - 97.9 %   Platelets 324 150 - 400 K/uL   nRBC 0.0 0.0 - 0.2 %    Comment: Performed at [facility], [address] [place], [place]  Comprehensive metabolic panel     Status: Abnormal   Collection Time: 03/01/21  8:15 PM  Result Value Ref Range   Sodium 135 135 - 145 mmol/L   Potassium 3.8 3.5 - 5.1 mmol/L   Chloride 107 98 - 111 mmol/L   CO2 23 22 - 32 mmol/L   Glucose, Bld 78 70 - 99 mg/dL    Comment: Glucose reference range applies only to samples taken after fasting for at least 8 hours.   BUN 5 (L) 6 - 20 mg/dL   Creatinine, Ser 03/03/21 0.44 - 1.00 mg/dL   Calcium 9.0 8.9 -  10.3 mg/dL   Total Protein 5.9 (L) 6.5 - 8.1 g/dL   Albumin 2.7 (L) 3.5 - 5.0 g/dL   AST 12 (L) 15 - 41 U/L   ALT 11 0 - 44 U/L   Alkaline Phosphatase 114 38 - 126 U/L   Total Bilirubin 0.4 0.3 - 1.2 mg/dL   GFR, Estimated >35 >68 mL/min    Comment: (NOTE) Calculated using the CKD-EPI Creatinine Equation (2021)    Anion gap 5 5 - 15    Comment: Performed at [facility], [address], [place], [place] [address]  Type and screen [facility]     Status: None   Collection Time: 03/01/21  9:40 PM  Result Value Ref Range   ABO/RH(D) O POS    Antibody Screen NEG    Sample Expiration       03/04/2021,2359 Performed at [facility], [address], [place], [place] [address]   Resp Panel by RT-PCR (Flu A&B, Covid) Nasopharyngeal Swab     Status: None   Collection Time: 03/01/21  9:40 PM   Specimen: Nasopharyngeal Swab; Nasopharyngeal(NP) swabs in vial transport medium  Result Value Ref Range   SARS Coronavirus 2 by RT PCR NEGATIVE NEGATIVE    Comment: (NOTE) SARS-CoV-2 target nucleic acids are NOT DETECTED.  The SARS-CoV-2 RNA is generally detectable in upper respiratory specimens during the acute phase of infection. The lowest concentration of SARS-CoV-2 viral copies this assay can detect is 138 copies/mL. A negative result does not preclude SARS-Cov-2 infection and should not be used as the sole basis for treatment or other patient management decisions. A negative result may occur with  improper specimen collection/handling, submission of specimen other than nasopharyngeal swab, presence of viral mutation(s) within the areas targeted by this assay, and inadequate number of viral copies(<138 copies/mL). A negative result must be combined with clinical observations, patient history, and epidemiological information. The expected result is Negative.  Fact Sheet for Patients:  BloggerCourse.com  Fact Sheet for Healthcare Providers:  SeriousBroker.it  This test is no t yet approved or cleared by the [place] FDA and  has been authorized for detection and/or diagnosis of SARS-CoV-2 by FDA under an Emergency Use Authorization (EUA). This EUA will remain  in effect (meaning this test can be used) for the duration of the COVID-19 declaration under Section 564(b)(1) of the Act, 21 U.S.C.section 360bbb-3(b)(1), unless the authorization is terminated  or revoked sooner.       Influenza A by PCR NEGATIVE NEGATIVE   Influenza B by PCR NEGATIVE NEGATIVE    Comment: (NOTE) The Xpert Xpress SARS-CoV-2/FLU/RSV plus  assay is intended as an aid in the diagnosis of influenza from Nasopharyngeal swab specimens and should not be used as a sole basis for treatment. Nasal washings and aspirates are unacceptable for Xpert Xpress SARS-CoV-2/FLU/RSV testing.  Fact Sheet for Patients: BloggerCourse.com  Fact Sheet for Healthcare Providers: SeriousBroker.it  This test is not yet approved or cleared by the [place] FDA and has been authorized for detection and/or diagnosis of SARS-CoV-2 by FDA under an Emergency Use Authorization (EUA). This EUA will remain in effect (meaning this test can be used) for the duration of the COVID-19 declaration under Section 564(b)(1) of the Act, 21 U.S.C. section 360bbb-3(b)(1), unless the authorization is terminated or revoked.  Performed at [facility], [address] [place], [place] [address]     I have reviewed the patient's current medications.  ASSESSMENT: Active  Problems:   Gestational hypertension   PLAN: Plan for delivery by repeat cesarean section. Pt currently NPO BP is labile, no current severe range pressures.   Mariel Aloe, MD [facility] Faculty Attending, Center for [facility] 03/02/2021 7:42 AM

## 2021-03-02 NOTE — Progress Notes (Signed)
Patient was just set up with a DEBP due to baby currently being in the NICU.

## 2021-03-02 NOTE — Transfer of Care (Signed)
Immediate Anesthesia Transfer of Care Note  Patient: Kimberly Farmer  Procedure(s) Performed: CESAREAN SECTION (N/A Abdomen)  Patient Location: PACU  Anesthesia Type:Spinal  Level of Consciousness: awake  Airway & Oxygen Therapy: Patient Spontanous Breathing  Post-op Assessment: Report given to RN and Post -op Vital signs reviewed and stable  Post vital signs: Reviewed and stable  Last Vitals:  Vitals Value Taken Time  BP 100/85 03/02/21 1446  Temp    Pulse 103 03/02/21 1447  Resp 22 03/02/21 1447  SpO2 87 % 03/02/21 1447  Vitals shown include unvalidated device data.  Last Pain:  Vitals:   03/02/21 1230  TempSrc:   PainSc: 3          Complications: No complications documented.

## 2021-03-02 NOTE — Anesthesia Postprocedure Evaluation (Signed)
Anesthesia Post Note  Patient: Kimberly Farmer  Procedure(s) Performed: CESAREAN SECTION (N/A Abdomen)     Patient location during evaluation: PACU Anesthesia Type: Spinal Level of consciousness: awake Pain management: pain level controlled Vital Signs Assessment: post-procedure vital signs reviewed and stable Respiratory status: spontaneous breathing, respiratory function stable and patient connected to nasal cannula oxygen Cardiovascular status: blood pressure returned to baseline and stable Postop Assessment: no headache, no backache and no apparent nausea or vomiting Anesthetic complications: no   No complications documented.  Last Vitals:  Vitals:   03/02/21 1545 03/02/21 1600  BP: 118/72 121/64  Pulse: 86 (!) 58  Resp: 17 (!) 27  Temp:  36.5 C  SpO2: 99% 100%    Last Pain:  Vitals:   03/02/21 1600  TempSrc: Oral  PainSc:    Pain Goal:                Epidural/Spinal Function Cutaneous sensation: Able to Wiggle Toes (03/02/21 1545), Patient able to flex knees: Yes (03/02/21 1545), Patient able to lift hips off bed: Yes (03/02/21 1545), Back pain beyond tenderness at insertion site: No (03/02/21 1545), Progressively worsening motor and/or sensory loss: No (03/02/21 1545), Bowel and/or bladder incontinence post epidural: No (03/02/21 1545)  Lera Gaines P Dontell Mian

## 2021-03-02 NOTE — Lactation Note (Signed)
This note was copied from a baby's chart. Lactation Consultation Note  Patient Name: Kimberly Farmer [id number] Date: 03/02/2021 Age:25 hours  Attempted LC visit to P2 mother of 4 hours old ET infant currently in NICU. DEBP has been proactively set up. Mother states she already used pump and has experience with it. Mother declined visit at this time because she is heading to NICU.   LC will come back to room at another time as possible.     Tyrece Vanterpool A Higuera Ancidey 03/02/2021, 6:06 PM

## 2021-03-02 NOTE — Anesthesia Preprocedure Evaluation (Addendum)
Anesthesia Evaluation  Patient identified by MRN, date of birth, ID band Patient awake    Reviewed: Allergy & Precautions, NPO status , Patient's Chart, lab work & pertinent test results  Airway Mallampati: II  TM Distance: >3 FB Neck ROM: Full    Dental no notable dental hx.    Pulmonary neg pulmonary ROS,    Pulmonary exam normal breath sounds clear to auscultation       Cardiovascular hypertension, Pt. on medications Normal cardiovascular exam Rhythm:Regular Rate:Normal     Neuro/Psych negative neurological ROS  negative psych ROS   GI/Hepatic negative GI ROS, Neg liver ROS,   Endo/Other  Morbid obesityPCOS (polycystic ovarian syndrome  Renal/GU negative Renal ROS     Musculoskeletal  (+) Arthritis ,   Abdominal (+) + obese,   Peds  Hematology  (+) anemia ,   Anesthesia Other Findings Repeat cesarean section GHTN LGA/poly  Reproductive/Obstetrics                            Anesthesia Physical Anesthesia Plan  ASA: III  Anesthesia Plan: Spinal   Post-op Pain Management:    Induction:   PONV Risk Score and Plan: 2 and Ondansetron, Dexamethasone and Treatment may vary due to age or medical condition  Airway Management Planned: Natural Airway  Additional Equipment:   Intra-op Plan:   Post-operative Plan:   Informed Consent: I have reviewed the patients History and Physical, chart, labs and discussed the procedure including the risks, benefits and alternatives for the proposed anesthesia with the patient or authorized representative who has indicated his/her understanding and acceptance.     Dental advisory given  Plan Discussed with: CRNA and Surgeon  Anesthesia Plan Comments:        Anesthesia Quick Evaluation

## 2021-03-03 ENCOUNTER — Telehealth: Payer: Medicaid Other | Admitting: Obstetrics & Gynecology

## 2021-03-03 LAB — CBC
HCT: 29.3 % — ABNORMAL LOW (ref 36.0–46.0)
Hemoglobin: 9 g/dL — ABNORMAL LOW (ref 12.0–15.0)
MCH: 23.9 pg — ABNORMAL LOW (ref 26.0–34.0)
MCHC: 30.7 g/dL (ref 30.0–36.0)
MCV: 77.7 fL — ABNORMAL LOW (ref 80.0–100.0)
Platelets: 255 10*3/uL (ref 150–400)
RBC: 3.77 MIL/uL — ABNORMAL LOW (ref 3.87–5.11)
RDW: 13.5 % (ref 11.5–15.5)
WBC: 10.6 10*3/uL — ABNORMAL HIGH (ref 4.0–10.5)
nRBC: 0 % (ref 0.0–0.2)

## 2021-03-03 LAB — COMPREHENSIVE METABOLIC PANEL
ALT: 10 U/L (ref 0–44)
AST: 18 U/L (ref 15–41)
Albumin: 2.3 g/dL — ABNORMAL LOW (ref 3.5–5.0)
Alkaline Phosphatase: 81 U/L (ref 38–126)
Anion gap: 4 — ABNORMAL LOW (ref 5–15)
BUN: <5 mg/dL — ABNORMAL LOW (ref 6–20)
CO2: 25 mmol/L (ref 22–32)
Calcium: 8.4 mg/dL — ABNORMAL LOW (ref 8.9–10.3)
Chloride: 106 mmol/L (ref 98–111)
Creatinine, Ser: 0.59 mg/dL (ref 0.44–1.00)
GFR, Estimated: >60 mL/min (ref >60)
Glucose, Bld: 71 mg/dL (ref 70–99)
Potassium: 4 mmol/L (ref 3.5–5.1)
Sodium: 135 mmol/L (ref 135–145)
Total Bilirubin: 0.4 mg/dL (ref 0.3–1.2)
Total Protein: 5.3 g/dL — ABNORMAL LOW (ref 6.5–8.1)

## 2021-03-03 LAB — BIRTH TISSUE RECOVERY COLLECTION (PLACENTA DONATION)

## 2021-03-03 NOTE — Discharge Summary (Signed)
Postpartum Discharge Summary     Patient Name: Kimberly Farmer DOB: [birth year] MRN: [id number]  Date of admission: 03/01/2021 Delivery date:03/02/2021  Delivering provider: Verita Schneiders A  Date of discharge: 03/05/2021  Admitting diagnosis: Gestational hypertension [O13.9] Intrauterine pregnancy: [redacted]w[redacted]d    Secondary diagnosis:  Principal Problem:   Cesarean delivery delivered Active Problems:   Hx of preeclampsia, prior pregnancy, currently pregnant, first trimester   Gestational hypertension  Additional problems: as noted above   Discharge diagnosis: Repeat Cesarean delivery delivered                                          Post partum procedures:none Augmentation: N/A Complications: None  Hospital course: Onset of Labor With Unplanned C/S   25y.o. yo [id number] admitted for gHTN overnight on 03/01/2021. Patient declined TOLAC. The patient went for cesarean section due to Elective Repeat. Delivery details as follows: Membrane Rupture Time/Date:  ,   Delivery Method:C-Section, Low Transverse  Details of operation can be found in separate operative note. Patient had an uncomplicated postpartum course.  She is ambulating,tolerating a regular diet, passing flatus, and urinating well.  Patient is discharged home in stable condition 03/05/21.  Newborn Data: Birth date:03/02/2021  Birth time:1:55 PM  Gender:Female  Living status:Living  Apgars:6 ,7  Weight:3620 g   Magnesium Sulfate received: No BMZ received: No Rhophylac:N/A MMR:N/A T-DaP:Given prenatally Flu: Nooffered prior to discharge Transfusion:No  Physical exam  Vitals:   03/04/21 1454 03/04/21 2124 03/05/21 0504 03/05/21 1429  BP: 131/78 110/60 139/84 (!) 104/53  Pulse: 97 90  85  Resp: _0 Temp: 98.2 F (36.8 C) 98.7 F (37.1 C) 98.3 F (36.8 C) 98.6 F (37 C)  TempSrc: Oral Oral Oral Oral  SpO2: 100% 100% 100% 99%  Weight:      Height:       General: alert, cooperative and no  distress Lochia: appropriate Uterine Fundus: firm Incision: Dressing dry, intact, area of brown blood stained on left side without new, red blood present, order entered for RN to change dressing prior to discharge DVT Evaluation: No evidence of DVT seen on physical exam. Negative Homan's sign. No cords or calf tenderness. No significant calf/ankle edema. Labs: Lab Results  Component Value Date   WBC 10.6 (H) 03/03/2021   HGB 9.0 (L) 03/03/2021   HCT 29.3 (L) 03/03/2021   MCV 77.7 (L) 03/03/2021   PLT 255 03/03/2021   CMP Latest Ref Rng & Units 03/03/2021  Glucose 70 - 99 mg/dL 71  BUN 6 - 20 mg/dL <5(L)  Creatinine 0.44 - 1.00 mg/dL 0.59  Sodium 135 - 145 mmol/L 135  Potassium 3.5 - 5.1 mmol/L 4.0  Chloride 98 - 111 mmol/L 106  CO2 22 - 32 mmol/L 25  Calcium 8.9 - 10.3 mg/dL 8.4(L)  Total Protein 6.5 - 8.1 g/dL 5.3(L)  Total Bilirubin 0.3 - 1.2 mg/dL 0.4  Alkaline Phos 38 - 126 U/L 81  AST 15 - 41 U/L 18  ALT 0 - 44 U/L 10   Edinburgh Score: Edinburgh Postnatal Depression Scale Screening Tool 03/03/2021  I have been able to laugh and see the funny side of things. 0  I have looked forward with enjoyment to things. 0  I have blamed myself unnecessarily when things went wrong. 0  I have been anxious or worried  for no good reason. 1  I have felt scared or panicky for no good reason. 1  Things have been getting on top of me. 0  I have been so unhappy that I have had difficulty sleeping. 0  I have felt sad or miserable. 0  I have been so unhappy that I have been crying. 1  The thought of harming myself has occurred to me. 0  Edinburgh Postnatal Depression Scale Total 3     After visit meds:  Allergies as of 03/05/2021      Reactions   Hydrocodone Other (See Comments)   Other Reaction: SEVERE HA & GI Upset   Oxycodone Other (See Comments)   Other Reaction: SEVERE HA & GI Upset   Tape Rash      Medication List    STOP taking these medications   aspirin EC 81 MG  tablet   calcium carbonate 500 MG chewable tablet Commonly known as: TUMS - dosed in mg elemental calcium     TAKE these medications   b complex vitamins tablet Take 1 tablet by mouth daily.   cholecalciferol 25 MCG (1000 UNIT) tablet Commonly known as: VITAMIN D3 Take 1 tablet (1,000 Units total) by mouth daily.   ferrous sulfate 325 (65 FE) MG tablet Take 1 tablet (325 mg total) by mouth every other day.   gabapentin 300 MG capsule Commonly known as: NEURONTIN Take 1 capsule (300 mg total) by mouth 2 (two) times daily for 10 days.   ibuprofen 600 MG tablet Commonly known as: ADVIL Take 1 tablet (600 mg total) by mouth every 6 (six) hours.   MULTI-VITAMIN PO Take by mouth daily.   norethindrone 0.35 MG tablet Commonly known as: Ortho Micronor Take 1 tablet (0.35 mg total) by mouth daily.   ONE A DAY PRENATAL PO Take by mouth.   phentermine 37.5 MG tablet Commonly known as: ADIPEX-P Take 1 tablet (37.5 mg total) by mouth daily before breakfast.   senna-docusate 8.6-50 MG tablet Commonly known as: Senokot-S Take 1 tablet by mouth at bedtime.   sucralfate 1 g tablet Commonly known as: CARAFATE Take 1 tablet (1 g total) by mouth 4 (four) times daily -  with meals and at bedtime.   topiramate 50 MG tablet Commonly known as: TOPAMAX Take 1 tablet (50 mg total) by mouth 2 (two) times daily.      Counseled patient on interaction between POPs and Topiramate and decreased effectiveness of POPs.  Discharge home in stable condition Infant Feeding: breast & donor milk Infant Disposition:home with mother Discharge instruction: per After Visit Summary and Postpartum booklet. Activity: Advance as tolerated. Pelvic rest for 6 weeks.  Diet: routine diet Future Appointments: Future Appointments  Date Time Provider [place]  03/10/2021  1:15 PM [facility] NURSE [facility]  04/06/2021 11:00 AM Donnamae Jude, MD [facility]   Follow up Visit:   [place] [facility] [place] [facility] Follow up.   Specialty: Obstetrics and Gynecology Why: In 1 week for incision check and 4 weeks for postpartum exam Contact information: [place] [phone]             Message sent to [facility] by Dr. Astrid Drafts.  Please schedule this patient for a In person postpartum visit in 6 weeks with the following provider: Any provider. Additional Postpartum F/U:Incision check 1 week and BP check 1 week  High risk pregnancy complicated by: now  h/o Cesarean x2, gHTN, h/o PreE in prior pregnancy Delivery mode:  C-Section, Low Transverse  Anticipated Birth Control:  Unsure   Patient had half elevated, half normal BPs on discharge, message sent to [facility] to schedule for BP check on Monday or Tuesday of next week, instead of waiting one week for incision check. Can determine need for BP meds at that time.   03/05/2021 Clarisa Fling, NP

## 2021-03-03 NOTE — Discharge Instructions (Signed)

## 2021-03-03 NOTE — Lactation Note (Signed)
This note was copied from a baby's chart. Lactation Consultation Note  Patient Name: Kimberly Farmer EXNTZ'G Date: 03/03/2021 Reason for consult: Initial assessment;NICU baby;Early term 37-38.6wks Age:25 hours   LC in to visit with P3 Mom in baby's room in the NICU.  Baby is off O2 and feeding at the breast.  Mom states baby was sleepy but now is more vigorous and is latching well.  Mom hears swallows.  Baby just fed for 22 minutes, no discomfort with latching.  Assisted Mom in placing baby prone STS on her chest.  Baby contented and sleeping.  Mom has a DEBP set up in her room.  Mom concerned about her milk supply and is pumping every 3 hrs after baby breastfeeds.    Offered to assist/assess at next feeding.  RN to call prn.  Maternal Data Has patient been taught Hand Expression?: Yes Does the patient have breastfeeding experience prior to this delivery?: Yes How long did the patient breastfeed?: still breastfeeding her 25 yr old, exclusively pumped for 6 months with 1st baby  Feeding Mother's Current Feeding Choice: Breast Milk and Donor Milk   Lactation Tools Discussed/Used Tools: Pump Breast pump type: Double-Electric Breast Pump Pump Education: Setup, frequency, and cleaning;Milk Storage Reason for Pumping: Infant in NICU Pumping frequency: Q 3hr after breastfeeding Pumped volume: 27 mL (pumping drops after initially expressing 27 ml)  Interventions Interventions: Breast feeding basics reviewed;Skin to skin;Breast massage;Hand express;DEBP  Discharge Pump: DEBP;Personal (Medela pump in style DEBP)  Consult Status Consult Status: Follow-up Date: 03/04/21 Follow-up type: In-patient    [name] 03/03/2021, 10:54 AM

## 2021-03-03 NOTE — Progress Notes (Addendum)
Patient ID: LUTIE PICKLER, female   DOB: Sep 29, 1996, 25 y.o.   MRN: [id number]  POSTPARTUM PROGRESS NOTE  Post Operative Day 1  Subjective:  KARILYN WIND is a 25 y.o. [phone] s/p elective rLTCS at [redacted]w[redacted]d.  No acute events overnight.  Pt denies problems with ambulating, voiding or po intake.  She denies nausea or vomiting.  Pain is well controlled and pt reports at 1/10 pain today.  She has not had flatus. She has not had bowel movement.  Lochia is minimal. She passed 1 large blood clot immediately after delivery yesterday but has not any since. She endorses going through 2-3 pads a day.  Objective: Blood pressure 112/74, pulse 73, temperature (!) 97.3 F (36.3 C), temperature source Oral, resp. rate 18, height 5\' 6"  (1.676 m), weight 123 kg, SpO2 100 %, currently breastfeeding.  Physical Exam:  General: alert, cooperative and no distress Chest: no respiratory distress Heart:regular rate, distal pulses intact Abdomen: soft, nontender, honeycomb dressing clean/dry/intact Uterine Fundus: firm, appropriately tender DVT Evaluation: No calf swelling or tenderness Extremities: no LE edema Skin: warm, dry  Recent Labs    03/01/21 2015  HGB 10.5*  HCT 34.2*    Assessment/Plan: AMARYAH MALLEN is a 25 y.o. 25 s/p rLTCS at [redacted]w[redacted]d.   POD#1- Doing well. Baby in NICU. Contraception: Partner Vasectomy  Feeding: Breastfeeding + donor milk Dispo: Plan for discharge POD#2-3.   LOS: 2 days   Shams, Rayad B, Medical Student, CNM 03/03/2021, 7:50 AM   Attestation of Supervision of Student:  I confirm that I have verified the information documented in the medical student's note and that I have also personally reperformed the history, physical exam and all medical decision making activities.  I have verified that all services and findings are accurately documented in this student's note; and I agree with management and plan as outlined in the documentation. I have also made any necessary  editorial changes.  03/05/2021, MD [facility], [facility] 03/03/2021 9:30 PM

## 2021-03-04 MED ORDER — FUROSEMIDE 20 MG PO TABS
20.0000 mg | ORAL_TABLET | Freq: Every day | ORAL | Status: DC
  Administered 2021-03-04 – 2021-03-05 (×2): 20 mg via ORAL
  Filled 2021-03-04 (×2): qty 1

## 2021-03-04 NOTE — Lactation Note (Signed)
This note was copied from a baby's chart. Lactation Consultation Note  Patient Name: Kimberly Farmer Date: 03/04/2021 Reason for consult: Follow-up assessment;Infant weight loss;Early term 37-38.6wks;Other (Comment) (possibly tandem nursing with a 25 y.o once she gets home) Age:47 hours  Visited with mom of 57 hours old ETI female, she's a P3 and experienced BF; she BF her 25 y.o until her hospital admission. Parents are supplementing with mother's milk and donor's milk, baby left the NICU last night but the only reason why family is not getting discharged is a weight loss of 9%; per mom, baby has been very sleepy and not wanting to eat as he should.  FOB doing STS with baby when entered the room, praised family for their efforts. Mom voiced she didn't need latch assistance at this point but she's aware she can call lactation when needed. Reviewed normal ETI behavior, cluster feeding, feeding cues, size of baby's stomach, LPI policy and supplementation guidelines for LPI (due to baby's behavior).  Feeding plan:  1. Parents will "reverse feed" for today to try to offset weight loss and help with infant's stage. Parents will offer EBM/donor milk first to provide calories for baby and hopefully he'll be more alert/awake to feed at the breast 2. Mom will continue putting baby to breast 8-12 times/24 hours or sooner if feeding cues are present 3. She'll continue pumping every 2-3 hours, whenever baby is being given a bottle 4. Parents will continue supplementation according to LPI policy, they're aware amounts are now at 20-30 ml/feeding and that volumes will increase tomorrow  FOB present and very supportive. Parents reported all questions and concerns were answered, they're both aware of LC OP services and will call PRN.  Maternal Data    Feeding Mother's Current Feeding Choice: Breast Milk and Donor Milk  Lactation Tools Discussed/Used Tools: Pump Breast pump type: Double-Electric  Breast Pump Pump Education: Setup, frequency, and cleaning;Milk Storage Reason for Pumping: ETI with 10% weight loss and sleepy Pumping frequency: q 3 hours Pumped volume: 10 mL  Interventions Interventions: Breast feeding basics reviewed;DEBP;Skin to skin  Discharge Pump: DEBP;Personal  Consult Status Consult Status: Follow-up Date: 03/05/21 Follow-up type: In-patient    Kimberly Farmer 03/04/2021, 4:40 PM

## 2021-03-04 NOTE — Progress Notes (Signed)
Patient ID: Kimberly Farmer, female   DOB: Aug 10, 1996, 25 y.o.   MRN: [id number]  POSTPARTUM PROGRESS NOTE  Post Operative Day 2  Subjective:  Kimberly Farmer is a 25 y.o. [phone] s/p elective rLTCS at [redacted]w[redacted]d.  No acute events overnight.  Pt denies problems with ambulating, voiding or po intake.  She denies nausea or vomiting.  Pain is well controlled  She has had flatus. She has had bowel movement.  Lochia is minimal.   Objective: Blood pressure 121/74, pulse 87, temperature 98.2 F (36.8 C), temperature source Oral, resp. rate 18, height 5\' 6"  (1.676 m), weight 123 kg, SpO2 100 %, currently breastfeeding.  Physical Exam:  General: alert, cooperative and no distress Chest: no respiratory distress Heart:regular rate, distal pulses intact Abdomen: soft, nontender, honeycomb dressing clean/dry/intact Uterine Fundus: firm, appropriately tender DVT Evaluation: No calf swelling or tenderness Extremities: 2+ BLE edema Skin: warm, dry  Recent Labs    03/01/21 2015 03/03/21 0754  HGB 10.5* 9.0*  HCT 34.2* 29.3*    Assessment/Plan: Kimberly Farmer is a 25 y.o. 25 s/p rLTCS at [redacted]w[redacted]d.   POD#2- Doing well. Baby stable at bedside. GHTN: BP stable, no medications for now. Continue to monitor closely. Lasix ordered for edema. Anemia: Oral iron therapy ordered Contraception: Partner Vasectomy, POPs in the interim Feeding: Breastfeeding + donor milk Dispo: Plan for discharge POD#3.   LOS: 3 days   Tangelia Sanson, [redacted]w[redacted]d, MD 03/04/2021, 6:01 AM

## 2021-03-05 ENCOUNTER — Ambulatory Visit: Payer: Self-pay

## 2021-03-05 ENCOUNTER — Encounter (HOSPITAL_COMMUNITY): Payer: Self-pay | Admitting: *Deleted

## 2021-03-05 MED ORDER — FERROUS SULFATE 325 (65 FE) MG PO TABS: 325.0000 mg | ORAL_TABLET | ORAL | 1 refills | Status: DC

## 2021-03-05 MED ORDER — SENNOSIDES-DOCUSATE SODIUM 8.6-50 MG PO TABS: 1.0000 | ORAL_TABLET | Freq: Every day | ORAL | 0 refills | Status: DC

## 2021-03-05 MED ORDER — NORETHINDRONE 0.35 MG PO TABS: 1.0000 | ORAL_TABLET | Freq: Every day | ORAL | 12 refills | Status: DC

## 2021-03-05 MED ORDER — GABAPENTIN 300 MG PO CAPS: 300.0000 mg | ORAL_CAPSULE | Freq: Two times a day (BID) | ORAL | 0 refills | Status: DC

## 2021-03-05 MED ORDER — IBUPROFEN 600 MG PO TABS: 600.0000 mg | ORAL_TABLET | Freq: Four times a day (QID) | ORAL | 0 refills | Status: DC

## 2021-03-05 MED ORDER — NORETHINDRONE 0.35 MG PO TABS
1.0000 | ORAL_TABLET | Freq: Every day | ORAL | 11 refills | Status: DC
  Filled 2021-03-05: qty 28, 28d supply, fill #0

## 2021-03-05 NOTE — Lactation Note (Addendum)
This note was copied from a baby's chart. Lactation Consultation Note  Patient Name: Kimberly Farmer [id number]'Y Date: 03/05/2021 Reason for consult: Follow-up assessment Age:25 years  Follow up visit to 25 hours old with 8.56% weight loss at the time of visit. Mother states breastfeeding is going well. Mother is pumping and bottlefeeding EBM.  Infant has been been having good voids and stools, per mother. Reinforced attention to hunger and fullness cues as well as making feeding effective by keeping infant awake Plan: 1-Skin to skin 2-Aim for a deep, comfortable latch 3-Breastfeeding on demand or 8-12 times in 24h period. 4-Keep infant awake during breastfeeding session: massaging breast, infant's hand/shoulder/feet 5-Pump as needed for supplementation and stimulation voids and stools as signs good intake.  7-Encouraged maternal rest, hydration and food intake.  8-Contact LC as needed for feeds/support/concerns/questions  All questions answered at this time. Family is ready to go home.  Feeding Mother's Current Feeding Choice: Breast Milk Nipple Type: Extra Slow Flow  LATCH Score Latch: Grasps breast easily, tongue down, lips flanged, rhythmical sucking.  Audible Swallowing: Spontaneous and intermittent  Type of Nipple: Everted at rest and after stimulation  Comfort (Breast/Nipple): Soft / non-tender  Hold (Positioning): No assistance needed to correctly position infant at breast.  LATCH Score: 10   Lactation Tools Discussed/Used Tools: Pump Breast pump type: Double-Electric Breast Pump Pumping frequency: as needed Pumped volume: 20 mL (per hour)  Interventions Interventions: Breast feeding basics reviewed;Education;DEBP;Expressed milk  Discharge Discharge Education: Engorgement and breast care;Warning signs for feeding baby Pump: Personal  Consult Status Consult Status: Complete Date: 03/05/21 Follow-up type: Call as needed    Daire Okimoto A Higuera  Ancidey 03/05/2021, 6:39 PM

## 2021-03-07 ENCOUNTER — Other Ambulatory Visit (HOSPITAL_COMMUNITY): Payer: Self-pay

## 2021-03-10 ENCOUNTER — Ambulatory Visit: Payer: Medicaid Other

## 2021-03-16 ENCOUNTER — Encounter (HOSPITAL_COMMUNITY)
Admission: RE | Admit: 2021-03-16 | Discharge: 2021-03-16 | Disposition: A | Discharge status: Home / Self Care | Payer: Medicaid Other | Source: Ambulatory Visit | Attending: Family Medicine | Admitting: Family Medicine

## 2021-03-16 ENCOUNTER — Other Ambulatory Visit (HOSPITAL_COMMUNITY): Payer: Medicaid Other

## 2021-03-18 ENCOUNTER — Inpatient Hospital Stay (HOSPITAL_COMMUNITY): Admit: 2021-03-18 | Payer: Medicaid Other | Admitting: Family Medicine

## 2021-03-18 ENCOUNTER — Encounter (HOSPITAL_COMMUNITY): Payer: Self-pay

## 2021-03-18 SURGERY — Surgical Case
Anesthesia: Regional

## 2021-03-21 ENCOUNTER — Ambulatory Visit: Payer: Medicaid Other

## 2021-04-04 ENCOUNTER — Ambulatory Visit: Payer: Self-pay

## 2021-04-04 NOTE — Lactation Note (Signed)
This note was copied from a baby's chart. Lactation Consultation Note  Patient Name: Kimberly Farmer [id number]'A Date: 04/04/2021 Reason for consult: Follow-up assessment;Early term 37-38.6wks Age:25 wk.o.  Initial LC Consult:  Previous LC asked me to follow up with this family regarding a lactation consult  Baby "Mitzie Na" was admitted via the ED when mother brought him in due to fever, poor feeding, lethargic, unable to focus on mother and general "not acting like himself" per mother.   Pecola Leisure is a former 31 week old infant who is now 54 weeks old.  After discharge mother had no feeding issues at home.  She did not breast feed her first child but was able to breast feed her second child (now 59 years old) almost until the delivery date of "[place]."  Baby "Mitzie Na" had a short stay in the NICU and was admitted for poor growth after delivery.  Per MD notes, baby has diagnoses of strep pneumoniae and paraflu 4.  Mother reports that "Mitzie Na" is already responding nicely as compared to his behavior on admission.  He is more active, focusing and is generally comfortable.  Mother has some anxiety related to her current situation and is not interested at all in latching to the breast at this time.  She wants to be able to see the exact amount of breast milk that he is consuming.  I acknowledged her need to feel more comfortable in this respect.  Mother reported that prior to the hospital admission she had been able to pump 6-8 ounces every two hours.  Currently she is obtaining 5 ounces every two hours.  Discussed the frequency of mother's pumping and suggested she pump every three hours with one four hour stretch of rest during the night time.  I could tell mother was hesitant with this suggestion but will consider it.  I discussed how this would allow for more rest but yet provide adequate volumes for baby.  Mother verbalized understanding.   Mother has been feeding with a Medela nipple from home.  When  questioned about his ability to feed well with this nipple, mother stated that he has been coughing at times and she feels this nipple flows too fast.  Suggested the purple extra slow flow nipple and mother remembers using this nipple after birth and commented on his success with this nipple.  Provided purple nipples for her convenience.  Asked her to evaluate this nipple and to let her RN know if this nipple does not allow for adequate feeding.    Spent considerable amount of time providing emotional support and reassurance.  Praised mother for her accurate observation of her baby's feeding and behavior and her prompt attention to bringing him into the ED.  Reassured her that prompt care has definitely helped her son progress in the short time he has been admitted.  Per RN, he will be here for at least 14 days and mother aware.  She is feeling guilty that she will not be able to be here full time due to having two other children to care for and her husband who is trying to work extra hours to help out financially.  Again, emotional support provided and informed mother that the staff members will help care for her son as needed when she is unavailable.  Mother appreciative.    Mother stated that when "Mitzie Na" is more healthy she would be interested in having another consult to help with latching.  Informed her that we will be available as  needed.  Mother prefers to use her own personal DEBP and has that at bedside.  No further questions/concerns at this time.  RN updated.    Maternal Data    Feeding Mother's Current Feeding Choice: Breast Milk Nipple Type: Slow - flow  LATCH Score                    Lactation Tools Discussed/Used    Interventions    Discharge Pump: Personal (Mother prefers to use her own pump)  Consult Status Consult Status: PRN    Dora Sims 04/04/2021, 6:58 PM

## 2021-04-06 ENCOUNTER — Ambulatory Visit: Payer: Medicaid Other | Admitting: Family Medicine

## 2021-04-06 ENCOUNTER — Encounter: Payer: Self-pay | Admitting: Family Medicine

## 2021-04-06 NOTE — Progress Notes (Signed)
Patient did not keep appointment today. She will be called to reschedule.  

## 2021-04-10 ENCOUNTER — Ambulatory Visit: Payer: Self-pay

## 2021-04-10 NOTE — Lactation Note (Signed)
This note was copied from a baby's chart. Lactation Consultation Note Mother is pumping and bottle feeding 3-4 oz q3 hours using newborn extra slow flow nipple (purple). Per mom, infant cannot tolerate faster flow bottle. When offered, he demonstrates stress cues, including choking. He also continues to struggle to breastfeed. According to mom's description, he may not be suckling effectively to elicit a milk letdown. As infant was recently bottle fed and sleeping soundly, LC did not observe a feeding at this visit. Infant will likely benefit from SLP consult. LC will plan return visit to observe baby at breast.   Plan of Care:  Mother to continue pumping and bottle feeding with purple nipple. She will continue to offer breast and discontinue with s/s of infant stress.   Patient Name: Kimberly Farmer ZYSAY'T Date: 04/10/2021 Reason for consult: Follow-up assessment;Difficult latch Age:25 wk.o.  Maternal Data  Mother's milk supply is wnl  Feeding Mother's Current Feeding Choice: Breast Milk   Consult Status Consult Status: Follow-up Date: 04/11/21 Follow-up type: In-patient   Elder Negus, MA IBCLC 04/10/2021, 10:10 AM

## 2021-04-10 NOTE — Lactation Note (Signed)
This note was copied from a baby's chart. Lactation Consultation Note  Patient Name: Earle Gell [id number] Date: 04/10/2021 Reason for consult: Follow-up assessment;Difficult latch;Early term 37-38.6wks;Other (Comment) (Peds readmit) Age:25 wk.o.  Visited with mom of 25 weeks old ETI female, she's a P3 and experienced BF, she BF her second baby until baby #3 was born, baby was readmited to the Peds unit due to bacterial meningitis and is currently on day 5 of his antibiotic Tx.  Mom voiced that when baby was given formula earlier on, "it tore down his stomach", he wouldn't tolerate it. She's pumping after feeding and supplementing with her EBM using a Dr. Manson Passey bottle with an ultra preemie nipple.   Offered assistance with latch and mom agreed to try again, baby just had a feeding but she was willing to put him back to the breast. Mom latched baby on to the right breast in cross cradle hold but he would constantly unlatch and stopped sucking at times. He would give lots of feeding cues though but would stop sucking at the breast on/off after just a few sucks.  Baby fed for 5 minutes at the breast and during the entire [facility] consultation he did not have any choking/emesis episode, showed mom how to do pace feeding in an upright position as an alternative of side lying. Baby did well sitting upright with pace feeding.   Feeding plan  Mom will continue double pumping after feedings every 3 hours She'll continue supplementing baby on feeding cues with her EBM, she's doing 60 ml but she's aware she can let baby have more if he desires She'll continue putting baby to breast on feeding cues. Mom aware to call lactation tomorrow if she needs a feeding assist, otherwise she'd like to be followed up in two days If mom has strong MIL baby might benefit from a NS, will reassess on next visit  No other support person in the room other than mom. She reported all questions and concerns were answered, she's  aware of LC OP services and will call PRN.  Maternal Data    Feeding Mother's Current Feeding Choice: Breast Milk Nipple Type: Dr. Levert Feinstein Preemie  LATCH Score Latch: Repeated attempts needed to sustain latch, nipple held in mouth throughout feeding, stimulation needed to elicit sucking reflex.  Audible Swallowing: Spontaneous and intermittent (mom was spraying milk but baby didn't have much interest in feeding at the breast at this point)  Type of Nipple: Everted at rest and after stimulation  Comfort (Breast/Nipple): Soft / non-tender  Hold (Positioning): No assistance needed to correctly position infant at breast. (minimal assistance needed)  LATCH Score: 9   Lactation Tools Discussed/Used Tools: Pump Breast pump type: Double-Electric Breast Pump Pump Education: Setup, frequency, and cleaning;Milk Storage Reason for Pumping: Peds readmision Pumping frequency: q 3 hours Pumped volume: 120 mL (120-180)  Interventions Interventions: Breast feeding basics reviewed;Assisted with latch;Skin to skin;Hand express;Breast compression;Adjust position;Support pillows;DEBP  Discharge Pump: Personal (She's using her Medela pump from home)  Consult Status Consult Status: Follow-up Date: 04/12/21 Follow-up type: In-patient    Kalib Bhagat Venetia Constable 04/10/2021, 9:19 PM

## 2021-04-11 ENCOUNTER — Ambulatory Visit: Payer: Self-pay

## 2021-04-11 NOTE — Lactation Note (Addendum)
This note was copied from a baby's chart. Lactation Consultation Note Mother believes that bf'ing has improved but is mostly pumping and bottle feeding during infant's illness. She is concerned that she has a plugged duct. She may not be effectively draining her breasts. I provided counseling and heat packs to assist with emptying breasts.   Patient Name: Kimberly Farmer [id number] Date: 04/11/2021 Reason for consult: Follow-up assessment Age:25 wk.o.  Maternal Data  Pumping frequency: q3 Pumped volume: 105 mL L breast is tender to touch. No redness. No palpable mass.   Feeding Mother's Current Feeding Choice: Breast Milk Nipple Type: Dr. Levert Feinstein Preemie  Interventions Interventions: Breast massage;Education (warm moist heat)   Consult Status Consult Status: PRN Follow-up type: In-patient   Elder Negus, MA IBCLC 04/11/2021, 6:07 PM

## 2021-04-14 ENCOUNTER — Ambulatory Visit: Payer: Self-pay

## 2021-04-14 NOTE — Lactation Note (Signed)
This note was copied from a baby's chart. Lactation Consultation Note  Patient Name: Earle Gell [id number] Date: 04/14/2021 Reason for consult: Follow-up assessment;Mother's request;Other (Comment) (mom decided to see if the baby would latch. baby fed 8-10 mins with swallows / LC eased down chin to counteract the recess chin / baby tolerated well with increase depth.) mom used the cross cradle - modified.  Age:25 wk.o. Per mom has been pumping every 2-3 hours and pumping off 4-6 oz off at a time ( both breast total. Per mom has been latching baby but when she is not as full .  LC recommended to continue the feeding plan she has been doing to continue steady weight gain, and work on the breast feeding and latching when she is not as full  due to the baby being able to handle the flow.  Mom mentioned she is using 2 different types of nipple / premie and ultra premie due to the baby being able to handle the flow and not get choked.  LC praised mom for her efforts pumping consistently and working on the breast feeding.  LC recommended considering and LC O/P appt after her speech O/P is completed for a pre and post weight. LC provided the [facility] brochure with resource numbers.    Maternal Data    Feeding Mother's Current Feeding Choice: Breast Milk Nipple Type: Nfant Slow Flow (purple)  LATCH Score Latch: Grasps breast easily, tongue down, lips flanged, rhythmical sucking.  Audible Swallowing: Spontaneous and intermittent  Type of Nipple: Everted at rest and after stimulation  Comfort (Breast/Nipple): Filling, red/small blisters or bruises, mild/mod discomfort  Hold (Positioning): No assistance needed to correctly position infant at breast.  LATCH Score: 9   Lactation Tools Discussed/Used    Interventions Interventions: Breast feeding basics reviewed;Adjust position;Support pillows;Education  Discharge Discharge Education: Engorgement and breast care;Warning signs for feeding  baby Pump: Personal;DEBP  Consult Status Consult Status: Complete Date: 04/14/21 Follow-up type: In-patient    Matilde Sprang Jentry Warnell 04/14/2021, 1:30 PM

## 2021-05-05 ENCOUNTER — Telehealth: Payer: Self-pay

## 2021-05-05 NOTE — Telephone Encounter (Signed)
Called and spoke with patient and arranged virtual appointment per patient request to address migraine headaches. KW

## 2021-05-05 NOTE — Telephone Encounter (Signed)
Copied from CRM [id number]. Topic: Appointment Scheduling - Scheduling Inquiry for Clinic >> May 05, 2021 12:28 PM Daphine Deutscher D wrote: Reason for CRM: pt is calling to see if she can see someone for migraines.  She use to take a medication but could not while she was pregnant.  She would like to start again taking something because they have came back.  CB# [phone]

## 2021-05-11 ENCOUNTER — Encounter: Payer: Self-pay | Admitting: Family Medicine

## 2021-05-11 ENCOUNTER — Telehealth (INDEPENDENT_AMBULATORY_CARE_PROVIDER_SITE_OTHER): Payer: 59 | Admitting: Family Medicine

## 2021-05-11 ENCOUNTER — Other Ambulatory Visit: Payer: Self-pay

## 2021-05-11 DIAGNOSIS — G43009 Migraine without aura, not intractable, without status migrainosus: Secondary | ICD-10-CM | POA: Insufficient documentation

## 2021-05-11 MED ORDER — SUMATRIPTAN SUCCINATE 50 MG PO TABS: 50.0000 mg | ORAL_TABLET | Freq: Every day | ORAL | 0 refills | Status: DC | PRN

## 2021-05-11 NOTE — Progress Notes (Signed)
MyChart Video Visit    Virtual Visit via Video Note   This visit type was conducted due to national recommendations for restrictions regarding the COVID-19 Pandemic (e.g. social distancing) in an effort to limit this patient's exposure and mitigate transmission in our community. This patient is at least at moderate risk for complications without adequate follow up. This format is felt to be most appropriate for this patient at this time. Physical exam was limited by quality of the video and audio technology used for the visit.   Patient location: private residence  Provider location: [facility]  I discussed the limitations of evaluation and management by telemedicine and the availability of in person appointments. The patient expressed understanding and agreed to proceed.  Patient: Kimberly Farmer   DOB: 08-Apr-1996   24 y.o. Female  MRN: [id number] Visit Date: 05/11/2021  Today's healthcare provider: Jacky Kindle, FNP   No chief complaint on file.  Subjective    Migraine  This is a recurrent problem. The current episode started more than 1 month ago. The problem occurs intermittently. The problem has been waxing and waning. The pain is located in the Temporal region. The pain radiates to the face. The pain quality is similar to prior headaches. Associated symptoms include phonophobia, photophobia and a visual change. Pertinent negatives include no abdominal pain, abnormal behavior, anorexia, back pain, blurred vision, coughing, dizziness, drainage, ear pain, eye pain, eye redness, eye watering, facial sweating, fever, hearing loss, insomnia, loss of balance, muscle aches, nausea, neck pain, numbness, rhinorrhea, scalp tenderness, seizures, sinus pressure, sore throat, swollen glands, tingling, tinnitus, vomiting, weakness or weight loss. The symptoms are aggravated by bright light, activity and emotional stress. She has tried nothing (patient previously was on Topomax but  states that she cannot take it anymore since she is breast feeding) for the symptoms. Her past medical history is significant for migraine headaches.     Patient Active Problem List   Diagnosis Date Noted   Cesarean delivery delivered 03/02/2021   Gestational hypertension 03/01/2021   Transient hypertension of pregnancy in third trimester 01/25/2021   Excessive fetal growth affecting management of mother in third trimester, antepartum 01/25/2021   Lactating mother 12/22/2020   Previous cesarean delivery affecting pregnancy, antepartum 09/21/2020   Supervision of other normal pregnancy, antepartum 08/25/2020   Hx of preeclampsia, prior pregnancy, currently pregnant, first trimester 08/25/2020   Choroid plexus cysts, fetal, affecting care of mother, antepartum 09/17/2018   PCOS (polycystic ovarian syndrome) 03/05/2018   BMI 37.0-37.9, adult 10/11/2016   Chronic midline low back pain without sciatica 04/24/2016   Airway hyperreactivity 09/27/2015   F/H of alcoholism 09/27/2015   Bone disease 09/24/2014   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   Past Medical History:  Diagnosis Date   Arthritis    Bilateral knees and back   Breakthrough bleeding on birth control pills 05/16/2018   Cesarean wound infection 03/18/2019   Choroid plexus cysts, fetal, affecting care of mother, antepartum 09/17/2018   Lumbar disc disease    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   PCOS (polycystic ovarian syndrome)    Post-dates pregnancy 02/21/2019   Pregnancy induced hypertension    Allergies  Allergen Reactions   Hydrocodone Other (See Comments)    Other Reaction: SEVERE HA & GI Upset   Oxycodone Other (See Comments)    Other Reaction: SEVERE HA & GI Upset   Tape Rash      Medications: Outpatient Medications Prior to Visit  Medication  Sig   b complex vitamins tablet Take 1 tablet by mouth daily. (Patient not taking: Reported on 08/25/2020)   cholecalciferol (VITAMIN D3) 25 MCG (1000 UNIT)  tablet Take 1 tablet (1,000 Units total) by mouth daily. (Patient not taking: Reported on 08/25/2020)   ferrous sulfate 325 (65 FE) MG tablet Take 1 tablet (325 mg total) by mouth every other day.   gabapentin (NEURONTIN) 300 MG capsule Take 1 capsule (300 mg total) by mouth 2 (two) times daily for 10 days.   ibuprofen (ADVIL) 600 MG tablet Take 1 tablet (600 mg total) by mouth every 6 (six) hours.   Multiple Vitamin (MULTI-VITAMIN PO) Take by mouth daily. (Patient not taking: Reported on 08/25/2020)   norethindrone (ORTHO MICRONOR) 0.35 MG tablet Take 1 tablet (0.35 mg total) by mouth daily.   phentermine (ADIPEX-P) 37.5 MG tablet Take 1 tablet (37.5 mg total) by mouth daily before breakfast. (Patient not taking: Reported on 08/25/2020)   Prenatal MV & Min w/FA-DHA (ONE A DAY PRENATAL PO) Take by mouth.   senna-docusate (SENOKOT-S) 8.6-50 MG tablet Take 1 tablet by mouth at bedtime.   sucralfate (CARAFATE) 1 g tablet Take 1 tablet (1 g total) by mouth 4 (four) times daily -  with meals and at bedtime. (Patient not taking: Reported on 08/25/2020)   topiramate (TOPAMAX) 50 MG tablet Take 1 tablet (50 mg total) by mouth 2 (two) times daily. (Patient not taking: Reported on 08/25/2020)   No facility-administered medications prior to visit.    Review of Systems  Constitutional:  Negative for fever and weight loss.  HENT:  Negative for ear pain, hearing loss, rhinorrhea, sinus pressure, sore throat and tinnitus.   Eyes:  Positive for photophobia. Negative for blurred vision, pain and redness.  Respiratory:  Negative for cough.   Gastrointestinal:  Negative for abdominal pain, anorexia, nausea and vomiting.  Musculoskeletal:  Negative for back pain and neck pain.  Neurological:  Negative for dizziness, tingling, seizures, weakness, numbness and loss of balance.  Psychiatric/Behavioral:  The patient does not have insomnia.      Objective    There were no vitals taken for this  visit.   Physical Exam Constitutional:      Appearance: Normal appearance. She is well-developed and well-groomed.  Pulmonary:     Effort: Pulmonary effort is normal.  Neurological:     General: No focal deficit present.     Mental Status: She is alert and oriented to person, place, and time.  Psychiatric:        Attention and Perception: Attention normal.        Mood and Affect: Mood normal.        Speech: Speech normal.        Behavior: Behavior normal.        Thought Content: Thought content normal. Thought content does not include homicidal or suicidal ideation.        Judgment: Judgment normal.       Assessment & Plan     Problem List Items Addressed This Visit       Cardiovascular and Mediastinum   Migraine without aura and without status migrainosus, not intractable - Primary    Variable presentation Pt currently breastfeeding, SAHM to 25 year old, 25 year old, and 3 month old. Caffeine use daily, denies withdrawal Sleeps 5-6 hrs/day Has tried acetaminophen, ibuprofen and naproxen in the past with minimal relief. Currently breastfeeding, wishes to exclusively breast feed for 1 year d/t financial reasons. Has tried  other triptans in past with decent relief; will retry Rx provided, dose adjusted for lactation        Relevant Medications   SUMAtriptan (IMITREX) 50 MG tablet     Other   Lactating mother    Patient currently nursing 65 month old only.   Plans to breastfeed/pump until 1 year d/t cost of formula and shortages. Wishes to treat migraines while ensuring milk supply remains consistent. Has tried OTC with minimal relief.        Relevant Medications   SUMAtriptan (IMITREX) 50 MG tablet   Pt verbalized that she does not get headaches or migraines when she is pregnant; and speculated that they may be related to changes in hormone levels.  At this time pt does not wish to discuss OCP use to regulate hormone levels in light of plan to breastfeed for 1  year.  No follow-ups on file.     I discussed the assessment and treatment plan with the patient. The patient was provided an opportunity to ask questions and all were answered. The patient agreed with the plan and demonstrated an understanding of the instructions.   The patient was advised to call back or seek an in-person evaluation if the symptoms worsen or if the condition fails to improve as anticipated.  I provided 17 minutes of non-face-to-face time during this encounter.  Leilani Merl, FNP, have reviewed all documentation for this visit. The documentation on 05/11/21 for the exam, diagnosis, procedures, and orders are all accurate and complete.  Jacky Kindle, FNP [facility] [phone] (phone) [phone] (fax)  [facility]

## 2021-05-11 NOTE — Assessment & Plan Note (Signed)
Patient currently nursing 81 month old only.   Plans to breastfeed/pump until 1 year d/t cost of formula and shortages. Wishes to treat migraines while ensuring milk supply remains consistent. Has tried OTC with minimal relief.

## 2021-05-11 NOTE — Assessment & Plan Note (Signed)
Variable presentation Pt currently breastfeeding, SAHM to 25 year old, 25 year old, and 10 month old. Caffeine use daily, denies withdrawal Sleeps 5-6 hrs/day Has tried acetaminophen, ibuprofen and naproxen in the past with minimal relief. Currently breastfeeding, wishes to exclusively breast feed for 1 year d/t financial reasons. Has tried other triptans in past with decent relief; will retry Rx provided, dose adjusted for lactation

## 2021-05-18 ENCOUNTER — Encounter: Payer: Self-pay | Admitting: Family Medicine

## 2021-05-18 ENCOUNTER — Other Ambulatory Visit: Payer: Self-pay | Admitting: Family Medicine

## 2021-05-18 DIAGNOSIS — Z6837 Body mass index (BMI) 37.0-37.9, adult: Secondary | ICD-10-CM

## 2021-06-06 ENCOUNTER — Ambulatory Visit (INDEPENDENT_AMBULATORY_CARE_PROVIDER_SITE_OTHER): Payer: Medicaid Other | Admitting: Obstetrics and Gynecology

## 2021-06-06 ENCOUNTER — Encounter: Payer: Self-pay | Admitting: Obstetrics and Gynecology

## 2021-06-06 ENCOUNTER — Other Ambulatory Visit: Payer: Self-pay

## 2021-06-06 DIAGNOSIS — Z6841 Body Mass Index (BMI) 40.0 and over, adult: Secondary | ICD-10-CM

## 2021-06-06 MED ORDER — ETONOGESTREL-ETHINYL ESTRADIOL 0.12-0.015 MG/24HR VA RING: VAGINAL_RING | VAGINAL | 3 refills | Status: DC

## 2021-06-06 NOTE — Progress Notes (Signed)
    Post Partum Visit Note  Kimberly Farmer is a 25 y.o. [id number] who is s/p 5/18 scheduled rpt c/s at 37wks due to gHTN.  She was discharged to home on POD#3.  Anesthesia: spinal. Postpartum course has been uncomplicated. Baby is doing well. Baby is feeding by  pumping breast milk . Bleeding no bleeding. Bowel function is normal. Bladder function is normal. Patient is sexually active. Contraception method is none Postpartum depression screening: negative.   Primary care provider: [facility] Postnatal Depression Scale - 06/06/21 1426       Edinburgh Postnatal Depression Scale:  In the Past 7 Days   I have been able to laugh and see the funny side of things. 0    I have looked forward with enjoyment to things. 0    I have blamed myself unnecessarily when things went wrong. 0    I have been anxious or worried for no good reason. 0    I have felt scared or panicky for no good reason. 0    Things have been getting on top of me. 0    I have been so unhappy that I have had difficulty sleeping. 0    I have felt sad or miserable. 0    I have been so unhappy that I have been crying. 0    The thought of harming myself has occurred to me. 0    Edinburgh Postnatal Depression Scale Total 0             Review of Systems Pertinent items noted in HPI and remainder of comprehensive ROS otherwise negative.  Objective:  BP 130/79 (BP Location: Left Arm, Patient Position: Sitting, Cuff Size: Normal)   Pulse 98   Ht 5' 6.5" (1.689 m)   Wt 253 lb 3.2 oz (114.9 kg)   LMP  (LMP Unknown) Comment: Only a day and half  Breastfeeding Yes Comment: Pumping  BMI 40.26 kg/m    General:  alert  Abdomen: soft, non-tender; bowel sounds normal; no masses,  no organomegaly and incision well heaed         Assessment:   Normal postpartum exam.   Plan:   *PP: patient is due for a pap smear but declines today. Birth control options d/w her; pt states partner was supposed to  get a vasectomy. She has migraines w/o aura and has used combined OCPs w/o issue but would like non qday method. Patient states that she is not on any prescription medications due to exclusively breastfeeding so I told her that all options are a possibility for her since no risk of reduced effectiveness from anti-migraine medications.. She would like to try the nuvaring. I told her to wait two weeks and take a home UPT and then can start and wait a week before considering effective. Strategies for weight loss d/w her and pt is amenable to nutrition referral which was made today.   RTC 2 months for annual and pap smear  [place] Bing, MD [facility], [facility]

## 2021-06-29 ENCOUNTER — Other Ambulatory Visit: Payer: Self-pay

## 2021-06-29 ENCOUNTER — Encounter: Payer: Self-pay | Admitting: Obstetrics and Gynecology

## 2021-06-29 ENCOUNTER — Encounter: Payer: Medicaid Other | Attending: Obstetrics and Gynecology | Admitting: Dietician

## 2021-07-14 NOTE — Progress Notes (Signed)
Kimberly Fetch, MD Your patient whom you referred for the Medical Nutrition Therapy Program has not come as you had requested.   Please discuss this with your patient.   When he/she decides to commit to the program, please let us know.   Office had a cancellation so arranged a MyChart video call for patient due to childcare issues. She did not log in for the appointment.  If your patient wished to reschedule, have her call our office.    Thank you for this referral, and if we can be of further assistance to you or your patient, let us know.     Kimberly Farmer  [facility]  [facility]  Administrative Clinical Assistant  Direct Dial: [phone]

## 2021-08-08 ENCOUNTER — Ambulatory Visit: Payer: Medicaid Other | Admitting: Obstetrics and Gynecology

## 2021-08-08 ENCOUNTER — Encounter: Payer: Self-pay | Admitting: Obstetrics and Gynecology

## 2021-08-08 NOTE — Progress Notes (Signed)
Patient did not keep her Annual GYN appointment for 08/08/2021.  Cornelia Copa MD Attending Center for [employer] [facility])

## 2021-08-24 ENCOUNTER — Telehealth: Payer: Self-pay

## 2021-08-24 NOTE — Telephone Encounter (Signed)
Copied from CRM [id number]. Topic: Appointment Scheduling - Scheduling Inquiry for Clinic >> Aug 24, 2021  3:50 PM Marylen Ponto wrote: Reason for CRM: Pt would like to schedule appt with one of the new providers for an annual physical. Cb# [phone]

## 2021-08-31 ENCOUNTER — Telehealth: Payer: Self-pay

## 2021-08-31 NOTE — Telephone Encounter (Signed)
Patient was advised that her appointment was not today, it is tomorrow.

## 2021-08-31 NOTE — Telephone Encounter (Signed)
Copied from CRM [id number]. Topic: Appointment Scheduling - Scheduling Inquiry for Clinic >> Aug 31, 2021 11:14 AM Gaetana Michaelis A wrote: Reason for CRM: The patient would like to be contacted to reschedule their missed virtual appt 08/31/21 at 10:40  Please contact further

## 2021-09-01 ENCOUNTER — Encounter: Payer: Self-pay | Admitting: Family Medicine

## 2021-09-01 ENCOUNTER — Telehealth (INDEPENDENT_AMBULATORY_CARE_PROVIDER_SITE_OTHER): Payer: 59 | Admitting: Family Medicine

## 2021-09-01 DIAGNOSIS — M2392 Unspecified internal derangement of left knee: Secondary | ICD-10-CM | POA: Diagnosis not present

## 2021-09-01 DIAGNOSIS — F419 Anxiety disorder, unspecified: Secondary | ICD-10-CM

## 2021-09-01 DIAGNOSIS — Z6841 Body Mass Index (BMI) 40.0 and over, adult: Secondary | ICD-10-CM

## 2021-09-01 DIAGNOSIS — F32A Depression, unspecified: Secondary | ICD-10-CM

## 2021-09-01 DIAGNOSIS — N3941 Urge incontinence: Secondary | ICD-10-CM | POA: Diagnosis not present

## 2021-09-01 MED ORDER — ETONOGESTREL-ETHINYL ESTRADIOL 0.12-0.015 MG/24HR VA RING: VAGINAL_RING | VAGINAL | 3 refills | Status: DC

## 2021-09-01 NOTE — Progress Notes (Signed)
MyChart Video Visit    Virtual Visit via Video Note   This visit type was conducted due to national recommendations for restrictions regarding the COVID-19 Pandemic (e.g. social distancing) in an effort to limit this patient's exposure and mitigate transmission in our community. This patient is at least at moderate risk for complications without adequate follow up. This format is felt to be most appropriate for this patient at this time. Physical exam was limited by quality of the video and audio technology used for the visit.   Patient location: home Provider location: [facility]   I discussed the limitations of evaluation and management by telemedicine and the availability of in person appointments. The patient expressed understanding and agreed to proceed.  Patient: Kimberly Farmer   DOB: 1996-10-05   25 y.o. Female  MRN: [id number] Visit Date: 09/01/2021  Today's healthcare provider: Jacky Kindle, FNP   Chief Complaint  Patient presents with   Knee Pain   Urinary Incontinence    Patient reports that she has been suffering with urinary incontinence for over a year now, she states since the recent birth of her child symptoms are worse and states that she has to change out of her pants twice a day because of having accidents.    Postpartum Care    Patient would like to discuss post partum anxiety/depression, she states that her youngest child is 6 months.    Subjective    Knee Pain  There was no injury mechanism. The pain is present in the left knee. The quality of the pain is described as aching and stabbing. The pain has been Fluctuating since onset. Associated symptoms include muscle weakness. Pertinent negatives include no inability to bear weight, loss of motion, loss of sensation, numbness or tingling. Associated symptoms comments: Patient reports joint gets locked. She has tried ice for the symptoms. The treatment provided no relief.  HPI     Urinary  Incontinence    Additional comments: Patient reports that she has been suffering with urinary incontinence for over a year now, she states since the recent birth of her child symptoms are worse and states that she has to change out of her pants twice a day because of having accidents.         Postpartum Care    Additional comments: Patient would like to discuss post partum anxiety/depression, she states that her youngest child is 6 months.       Last edited by Fonda Kinder, CMA on 09/01/2021  9:42 AM.       Medications: Outpatient Medications Prior to Visit  Medication Sig   [DISCONTINUED] b complex vitamins tablet Take 1 tablet by mouth daily. (Patient not taking: Reported on 08/25/2020)   [DISCONTINUED] cholecalciferol (VITAMIN D3) 25 MCG (1000 UNIT) tablet Take 1 tablet (1,000 Units total) by mouth daily. (Patient not taking: Reported on 08/25/2020)   [DISCONTINUED] etonogestrel-ethinyl estradiol (NUVARING) 0.12-0.015 MG/24HR vaginal ring Take a home pregnancy test prior to insertion Insert vaginally and leave in place for 3 consecutive weeks, then remove for 1 week.   [DISCONTINUED] ferrous sulfate 325 (65 FE) MG tablet Take 1 tablet (325 mg total) by mouth every other day. (Patient not taking: Reported on 05/11/2021)   [DISCONTINUED] gabapentin (NEURONTIN) 300 MG capsule Take 1 capsule (300 mg total) by mouth 2 (two) times daily for 10 days.   [DISCONTINUED] ibuprofen (ADVIL) 600 MG tablet Take 1 tablet (600 mg total) by mouth every 6 (six) hours. (  Patient not taking: Reported on 05/11/2021)   [DISCONTINUED] Multiple Vitamin (MULTI-VITAMIN PO) Take by mouth daily. (Patient not taking: Reported on 08/25/2020)   [DISCONTINUED] phentermine (ADIPEX-P) 37.5 MG tablet Take 1 tablet (37.5 mg total) by mouth daily before breakfast. (Patient not taking: Reported on 08/25/2020)   [DISCONTINUED] Prenatal MV & Min w/FA-DHA (ONE A DAY PRENATAL PO) Take by mouth. (Patient not taking: Reported  on 06/06/2021)   [DISCONTINUED] senna-docusate (SENOKOT-S) 8.6-50 MG tablet Take 1 tablet by mouth at bedtime. (Patient not taking: Reported on 05/11/2021)   [DISCONTINUED] sucralfate (CARAFATE) 1 g tablet Take 1 tablet (1 g total) by mouth 4 (four) times daily -  with meals and at bedtime. (Patient not taking: Reported on 08/25/2020)   [DISCONTINUED] SUMAtriptan (IMITREX) 50 MG tablet Take 1 tablet (50 mg total) by mouth daily as needed for migraine. May repeat in 2 hours if headache persists or recurs. (Patient not taking: Reported on 06/06/2021)   [DISCONTINUED] topiramate (TOPAMAX) 50 MG tablet Take 1 tablet (50 mg total) by mouth 2 (two) times daily. (Patient not taking: Reported on 08/25/2020)   No facility-administered medications prior to visit.    Review of Systems  Neurological:  Negative for tingling and numbness.     Objective    There were no vitals taken for this visit.   Physical Exam Nursing note reviewed.  Constitutional:      Appearance: Normal appearance.  HENT:     Head: Normocephalic and atraumatic.  Pulmonary:     Effort: Pulmonary effort is normal.  Neurological:     General: No focal deficit present.     Mental Status: She is alert and oriented to person, place, and time. Mental status is at baseline.  Psychiatric:        Attention and Perception: Attention normal.        Mood and Affect: Mood is anxious. Affect is tearful.        Speech: Speech normal.        Behavior: Behavior is cooperative.        Thought Content: Thought content normal. Thought content is not paranoid or delusional. Thought content does not include homicidal or suicidal ideation. Thought content does not include homicidal or suicidal plan.        Cognition and Memory: Cognition normal.        Judgment: Judgment normal.     Comments: Some distracted conversation as patient has 2 young kids within the house       Assessment & Plan     Problem List Items Addressed This Visit        Other   Anxiety and depression - Primary    Overwhelmed, young mom, 3 kids Oldest with dx of autism- not tolerating traditional schooling Youngest with recent infection- with lots of appts Still breastfeeding Hx of pregnancy in 2020- with acute on chronic depression given setting of COVID Does not wish to start medication at this time, wants referral to talk to someone CCM engaged      Relevant Orders   AMB Referral to [facility] Coordinaton   Knee locking, left    Advised use of OTC NSAIDs to assist with arthritis and recommend weight loss to assist with further reduction of problems Referral placed to ortho given limited extent of exam over virtual appt      Relevant Orders   Ambulatory referral to Orthopedic Surgery   Class 3 severe obesity due to excess calories without serious comorbidity with body mass  index (BMI) of 40.0 to 44.9 in adult [facility])    Last BMI 40 Chronic issue Poor self care d/t stress and overwhelmed Unable to follow up with referrals d/t lack of childcare      Relevant Medications   etonogestrel-ethinyl estradiol (NUVARING) 0.12-0.015 MG/24HR vaginal ring   Urge incontinence    Plan to reduce stimulant use at this time, currently drink 3+ cups of coffee per day Referral placed for pelvic floor PT      Relevant Orders   Ambulatory referral to Physical Therapy     No follow-ups on file.     I discussed the assessment and treatment plan with the patient. The patient was provided an opportunity to ask questions and all were answered. The patient agreed with the plan and demonstrated an understanding of the instructions.   The patient was advised to call back or seek an in-person evaluation if the symptoms worsen or if the condition fails to improve as anticipated.  I provided 16 minutes of non-face-to-face time during this encounter.  Leilani Merl, FNP, have reviewed all documentation for this visit. The documentation on 09/01/21 for the exam,  diagnosis, procedures, and orders are all accurate and complete.   Jacky Kindle, FNP [facility] [phone] (phone) [phone] (fax)  [facility]

## 2021-09-01 NOTE — Assessment & Plan Note (Signed)
Plan to reduce stimulant use at this time, currently drink 3+ cups of coffee per day Referral placed for pelvic floor PT

## 2021-09-01 NOTE — Assessment & Plan Note (Signed)
Advised use of OTC NSAIDs to assist with arthritis and recommend weight loss to assist with further reduction of problems Referral placed to ortho given limited extent of exam over virtual appt

## 2021-09-01 NOTE — Assessment & Plan Note (Signed)
Overwhelmed, young mom, 3 kids Oldest with dx of autism- not tolerating traditional schooling Youngest with recent infection- with lots of appts Still breastfeeding Hx of pregnancy in 2020- with acute on chronic depression given setting of COVID Does not wish to start medication at this time, wants referral to talk to someone CCM engaged

## 2021-09-01 NOTE — Assessment & Plan Note (Signed)
Last BMI 40 Chronic issue Poor self care d/t stress and overwhelmed Unable to follow up with referrals d/t lack of childcare

## 2021-09-06 ENCOUNTER — Other Ambulatory Visit: Payer: Self-pay | Admitting: Licensed Clinical Social Worker

## 2021-09-06 DIAGNOSIS — F419 Anxiety disorder, unspecified: Secondary | ICD-10-CM

## 2021-09-06 DIAGNOSIS — F32A Depression, unspecified: Secondary | ICD-10-CM

## 2021-09-06 NOTE — Patient Instructions (Signed)
Visit Information  Ms. Hege was given information about Medicaid Managed Care team care coordination services as a part of their Healthy [facility] Medicaid benefit. [name] verbally consented to engagement with the [facility] Managed Care team.   If you are experiencing a medical emergency, please call 911 or report to your local emergency department or urgent care.   If you have a non-emergency medical problem during routine business hours, please contact your provider's office and ask to speak with a nurse.   For questions related to your Healthy [facility] health plan, please call: [phone] or visit the homepage here: [email or web address]  If you would like to schedule transportation through your Healthy [facility] plan, please call the following number at least 2 days in advance of your appointment: [phone]  Call the [facility] Crisis Line at [phone], at any time, 24 hours a day, 7 days a week. If you are in danger or need immediate medical attention call 911.  If you would like help to quit smoking, call 1-800-QUIT-NOW (612-565-5946) OR Espaol: 1-855-Djelo-Ya (0-630-160-1093) o para ms informacin haga clic aqu or Text READY to 235-573 to register via text   Following is a copy of your plan of care:  Care Plan : General Social Work (Adult)  Updates made by Gustavus Bryant, LCSW since 09/06/2021 12:00 AM     Problem: Anxiety Identification (Anxiety)      Long-Range Goal: Anxiety Symptoms Identified   Start Date: 09/06/2021  This Visit's Progress: On track  Note:   Anxiety and depression symptoms have increased recently.    Long-Range Goal: Manage My Emotions   Start Date: 09/06/2021  This Visit's Progress: On track  Priority: High  Note:   Timeframe:  Long-Range Goal Priority:  High Start Date:    09/06/21                        Expected End Date:  ongoing                      Follow Up Date  09/23/21   - begin personal counseling - call and visit an old friend - check out volunteer opportunities - join a support group - laugh; watch a funny movie or comedian - learn and use visualization or guided imagery - perform a random act of kindness - practice relaxation or meditation daily - start or continue a personal journal - talk about feelings with a friend, family or spiritual advisor - practice positive thinking and self-talk    Why is this important?   When you are stressed, down or upset, your body reacts too.  For example, your blood pressure may get higher; you may have a headache or stomachache.  When your emotions get the best of you, your body's ability to fight off cold and flu gets weak.  These steps will help you manage your emotions.        Dickie La, BSW, MSW, [employer] Managed Medicaid LCSW [facility]  [email or web address] Phone: [phone]

## 2021-09-06 NOTE — Patient Outreach (Signed)
Medicaid Managed Care Social Work Note  09/06/2021 Name:  Kimberly Farmer MRN:  [id number] DOB:  1996/05/01  Kimberly Farmer is an 25 y.o. year old female who is a primary patient of Jacky Kindle, FNP.  The Medicaid Managed Care Coordination team was consulted for assistance with:  Mental Health Counseling and Resources  Kimberly Farmer was given information about Medicaid Managed Care Coordination team services today. Kimberly Farmer Patient agreed to services and verbal consent obtained.  Engaged with patient  for by telephone forinitial visit in response to referral for case management and/or care coordination services.   Assessments/Interventions:  Review of past medical history, allergies, medications, health status, including review of consultants reports, laboratory and other test data, was performed as part of comprehensive evaluation and provision of chronic care management services.  SDOH: (Social Determinant of Health) assessments and interventions performed: SDOH Interventions    Flowsheet Row Most Recent Value  SDOH Interventions   Stress Interventions Offered [employer] Resources       Advanced Directives Status:  See Care Plan for related entries.  Care Plan                 Allergies  Allergen Reactions   Hydrocodone Other (See Comments)    Other Reaction: SEVERE HA & GI Upset   Oxycodone Other (See Comments)    Other Reaction: SEVERE HA & GI Upset   Tape Rash    Medications Reviewed Today     Reviewed by Jacky Kindle, FNP (Family Nurse Practitioner) on 09/01/21 at 1058  Med List Status: <None>   Medication Order Taking? Sig Documenting Provider Last Dose Status Informant  Patient not taking:  Discontinued 09/01/21 1056   Patient not taking:  Discontinued 09/01/21 1056   etonogestrel-ethinyl estradiol (NUVARING) 0.12-0.015 MG/24HR vaginal ring [id number]  Take a home pregnancy test prior to insertion Insert vaginally and leave in place for 3  consecutive weeks, then remove for 1 week. Jacky Kindle, FNP  Active   Patient not taking:  Discontinued 09/01/21 1056     Discontinued 09/01/21 1058   Patient not taking:  Discontinued 09/01/21 1056   Patient not taking:  Discontinued 09/01/21 1056   Patient not taking:  Discontinued 09/01/21 1057   Patient not taking:  Discontinued 09/01/21 1057 Patient not taking:  Discontinued 09/01/21 1056   Patient not taking:  Discontinued 09/01/21 1056   Patient not taking:  Discontinued 09/01/21 1056   Patient not taking:  Discontinued 09/01/21 1057             Patient Active Problem List   Diagnosis Date Noted   Anxiety and depression 09/01/2021   Knee locking, left 09/01/2021   Class 3 severe obesity due to excess calories without serious comorbidity with body mass index (BMI) of 40.0 to 44.9 in adult (HCC) 09/01/2021   Urge incontinence 09/01/2021   Migraine without aura and without status migrainosus, not intractable 05/11/2021   PCOS (polycystic ovarian syndrome) 03/05/2018   Chronic midline low back pain without sciatica 04/24/2016   Airway hyperreactivity 09/27/2015   F/H of alcoholism 09/27/2015   Bone disease 09/24/2014   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    Long-Range Goal: Manage My Emotions   Start Date: 09/06/2021  This Visit's Progress: On track  Priority: High  Note:   Timeframe:  Long-Range Goal Priority:  High Start Date:    09/06/21                        Expected End Date:  ongoing                      Follow Up Date 09/23/21    - begin personal counseling - call and visit an old friend - check out volunteer opportunities - join a support group - laugh; watch a funny movie or comedian - learn and use visualization or guided imagery - perform a random act of kindness - practice relaxation or meditation daily - start or continue a personal journal - talk about feelings with a friend, family or spiritual advisor - practice positive thinking and self-talk    Why is this important?   When you are stressed, down or upset, your body reacts too.  For example, your blood pressure may get higher; you may have a headache or stomachache.  When your emotions get the best of you, your body's ability to fight off cold and flu gets weak.  These steps will help you manage your emotions.     Current barriers:   Chronic Mental Health needs related to Postpartum depression, depression and anxiety Mental Health Concerns  Needs Support, Education, and Care Coordination in order to meet unmet mental health needs. Clinical Goal(s): demonstrate a reduction in symptoms related to :Anxiety with Excessive Worry, Panic Symptoms,  verbalize understanding of plan for management of Anxiety and Depression  explore community resource options for unmet needs related to:  Stress  Clinical Interventions:  Assessed patient's previous and current treatment, coping skills, support system and barriers to care  Motivational Interviewing employed Depression screen reviewed  Mindfulness or Relaxation training provided Active listening / Reflection utilized  Emotional Support Provided Behavioral Activation reviewed Problem Solving /Task Center strategies reviewed Provided psychoeducation for mental health needs  Provided brief CBT  Participation in counseling encouraged  Discussed Health Care Power of Attorney  Made referral to [facility] on 09/06/21  ; Review resources, discussed options and provided patient information about  Options for mental  health treatment based on need and insurance Email sent securely to patient on 09/06/21 with counseling options Patient lives in a house with her spouse and 3 young children. She DOES NOT want medication management and prefers therapy only. Patient would like to gain virtual therapy if able due to being the sole caretaker of her 3 children. Referral made to [facility] for counseling.  Inter-disciplinary care team collaboration (see longitudinal plan of care) Patient Goals/Self-Care Activities: Over the next 120 days connect with provider for ongoing mental health treatment.   Increase coping skills, healthy habits, self-management skills, and stress reduction Call [facility]   10 LITTLE Things To Do When You're Feeling Too Down To Do Anything  Take a shower. Even if you plan to stay in all day long and not see a soul, take a shower. It takes the most effort to hop in to the shower but once you do, you'll feel immediate results. It will wake you up and you'll be feeling much fresher (and cleaner too).  Brush and  floss your teeth. Give your teeth a good brushing with a floss finish. It's a small task but it feels so good and you can check 'taking care of your health' off the list of things to do.  Do something small on your list. Most of [place] have some small thing we would like to get done (load of laundry, sew a button, email a friend). Doing one of these things will make you feel like you've accomplished something.  Drink water. Drinking water is easy right? It's also really beneficial for your health so keep a glass beside you all day and take sips often. It gives you energy and prevents you from boredom eating.  Do some floor exercises. The last thing you want to do is exercise but it might be just the thing you need the most. Keep it simple and do exercises that involve sitting or laying on the floor. Even the smallest of exercises release chemicals in the brain that make you feel good. Yoga stretches  or core exercises are going to make you feel good with minimal effort.  Make your bed. Making your bed takes a few minutes but it's productive and you'll feel relieved when it's done. An unmade bed is a huge visual reminder that you're having an unproductive day. Do it and consider it your housework for the day.  Put on some nice clothes. Take the sweatpants off even if you don't plan to go anywhere. Put on clothes that make you feel good. Take a look in the mirror so your brain recognizes the sweatpants have been replaced with clothes that make you look great. It's an instant confidence booster.  Wash the dishes. A pile of dirty dishes in the sink is a reflection of your mood. It's possible that if you wash up the dishes, your mood will follow suit. It's worth a try.  Cook a real meal. If you have the luxury to have a "do nothing" day, you have time to make a real meal for yourself. Make a meal that you love to eat. The process is good to get you out of the funk and the food will ensure you have more energy for tomorrow.  Write out your thoughts by hand. When you hand write, you stimulate your brain to focus on the moment that you're in so make yourself comfortable and write whatever comes into your mind. Put those thoughts out on paper so they stop spinning around in your head. Those thoughts might be the very thing holding you down.        Follow up:  Patient agrees to Care Plan and Follow-up.  Plan: The Managed Medicaid care management team will reach out to the patient again over the next 30 days.  Date/time of next scheduled Social Work care management/care coordination outreach:  09/23/21 at 2:00 pm.  Dickie La, BSW, MSW, LCSW Managed Medicaid LCSW [facility]  [email or web address] Phone: [phone]

## 2021-09-15 ENCOUNTER — Telehealth: Payer: Self-pay | Admitting: Family Medicine

## 2021-09-15 NOTE — Telephone Encounter (Signed)
Pt was contacted regarding her referrals. She reported that she has 3 children and she is not able to bring her children to PT. Pt declined the information for referrals.

## 2021-09-23 ENCOUNTER — Other Ambulatory Visit: Payer: Self-pay | Admitting: Licensed Clinical Social Worker

## 2021-09-23 NOTE — Patient Outreach (Signed)
Medicaid Managed Care Social Work Note  09/23/2021 Name:  Kimberly Farmer MRN:  [id number] DOB:  [birth year]  Kimberly Farmer is an 25 y.o. year old female who is a primary patient of Jacky Kindle, FNP.  The Medicaid Managed Care Coordination team was consulted for assistance with:  Mental Health Counseling and Resources  Kimberly Farmer was given information about Medicaid Managed Care Coordination team services today. Kimberly Farmer Patient agreed to services and verbal consent obtained.  Engaged with patient  for by telephone forfollow up visit in response to referral for case management and/or care coordination services.   Assessments/Interventions:  Review of past medical history, allergies, medications, health status, including review of consultants reports, laboratory and other test data, was performed as part of comprehensive evaluation and provision of chronic care management services.  SDOH: (Social Determinant of Health) assessments and interventions performed: SDOH Interventions    Flowsheet Row Most Recent Value  SDOH Interventions   Stress Interventions Provide Counseling, Offered [employer] Resources       Advanced Directives Status:  See Care Plan for related entries.  Care Plan                 Allergies  Allergen Reactions   Hydrocodone Other (See Comments)    Other Reaction: SEVERE HA & GI Upset   Oxycodone Other (See Comments)    Other Reaction: SEVERE HA & GI Upset   Tape Rash    Medications Reviewed Today     Reviewed by Jacky Kindle, FNP (Family Nurse Practitioner) on 09/01/21 at 1058  Med List Status: <None>   Medication Order Taking? Sig Documenting Provider Last Dose Status Informant  Patient not taking:  Discontinued 09/01/21 1056   Patient not taking:  Discontinued 09/01/21 1056   etonogestrel-ethinyl estradiol (NUVARING) 0.12-0.015 MG/24HR vaginal ring [id number]  Take a home pregnancy test prior to insertion Insert vaginally and leave  in place for 3 consecutive weeks, then remove for 1 week. Jacky Kindle, FNP  Active   Patient not taking:  Discontinued 09/01/21 1056     Discontinued 09/01/21 1058   Patient not taking:  Discontinued 09/01/21 1056   Patient not taking:  Discontinued 09/01/21 1056   Patient not taking:  Discontinued 09/01/21 1057   Patient not taking:  Discontinued 09/01/21 1057 Patient not taking:  Discontinued 09/01/21 1056   Patient not taking:  Discontinued 09/01/21 1056   Patient not taking:  Discontinued 09/01/21 1056   Patient not taking:  Discontinued 09/01/21 1057             Patient Active Problem List   Diagnosis Date Noted   Anxiety and depression 09/01/2021   Knee locking, left 09/01/2021   Class 3 severe obesity due to excess calories without serious comorbidity with body mass index (BMI) of 40.0 to 44.9 in adult (HCC) 09/01/2021   Urge incontinence 09/01/2021   Migraine without aura and without status migrainosus, not intractable 05/11/2021   PCOS (polycystic ovarian syndrome) 03/05/2018   Chronic midline low back pain without sciatica 04/24/2016   Airway hyperreactivity 09/27/2015   F/H of alcoholism 09/27/2015   Bone disease 09/24/2014   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    Conditions to be addressed/monitored per PCP order:  Anxiety and Depression  Care Plan : General Social Work (Adult)  Updates made by Gustavus Bryant, LCSW since 09/23/2021 12:00 AM     Problem: Anxiety Identification (Anxiety)      Long-Range Goal:  Manage My Emotions   Start Date: 09/06/2021  Recent Progress: On track  Priority: High  Note:   Timeframe:  Long-Range Goal Priority:  High Start Date:    09/06/21                        Expected End Date:  ongoing                      Follow Up Date 10/25/21   - begin personal counseling - call and visit an old friend - check out volunteer opportunities - join a support group - laugh; watch a funny movie or comedian -  learn and use visualization or guided imagery - perform a random act of kindness - practice relaxation or meditation daily - start or continue a personal journal - talk about feelings with a friend, family or spiritual advisor - practice positive thinking and self-talk    Why is this important?   When you are stressed, down or upset, your body reacts too.  For example, your blood pressure may get higher; you may have a headache or stomachache.  When your emotions get the best of you, your body's ability to fight off cold and flu gets weak.  These steps will help you manage your emotions.     Current barriers:   Chronic Mental Health needs related to Postpartum depression, depression and anxiety Mental Health Concerns  Needs Support, Education, and Care Coordination in order to meet unmet mental health needs. Clinical Goal(s): demonstrate a reduction in symptoms related to :Anxiety with Excessive Worry, Panic Symptoms,  verbalize understanding of plan for management of Anxiety and Depression  explore community resource options for unmet needs related to:  Stress  Clinical Interventions:  Assessed patient's previous and current treatment, coping skills, support system and barriers to care  Motivational Interviewing employed Depression screen reviewed  Mindfulness or Relaxation training provided Active listening / Reflection utilized  Emotional Support Provided Behavioral Activation reviewed Problem Solving /Task Center strategies reviewed Provided psychoeducation for mental health needs  Provided brief CBT  Participation in counseling encouraged  Discussed Health Care Power of Attorney  Made referral to [facility] on 09/06/21  ; Review resources, discussed options and provided patient information about  Options for mental health treatment based on need and insurance Email sent securely to patient on 09/06/21 with counseling options Patient lives in a house with her spouse and 3 young  children. She DOES NOT want medication management and prefers therapy only. Patient would like to gain virtual therapy if able due to being the sole caretaker of her 3 children. Referral made to [facility] for counseling.  Inter-disciplinary care team collaboration (see longitudinal plan of care) Update 09/23/21- Patient has upcoming appointment at [facility] for counseling on 10/24/21. She reports that this appointment is virtual. Patient states that she is doing well and implementing appropriate self-care. Patient request assistance with breast bump supplies. [facility] LCSW will make referral to [facility] BSW for this.  Patient Goals/Self-Care Activities: Over the next 120 days connect with provider for ongoing mental health treatment.   Increase coping skills, healthy habits, self-management skills, and stress reduction Call [employer]   10 LITTLE Things To Do When You're Feeling Too Down To Do Anything  Take a shower. Even if you plan to stay in all day long and not see a soul, take a shower. It takes the most effort to hop in to the shower but once you  do, you'll feel immediate results. It will wake you up and you'll be feeling much fresher (and cleaner too).  Brush and floss your teeth. Give your teeth a good brushing with a floss finish. It's a small task but it feels so good and you can check 'taking care of your health' off the list of things to do.  Do something small on your list. Most of [place] have some small thing we would like to get done (load of laundry, sew a button, email a friend). Doing one of these things will make you feel like you've accomplished something.  Drink water. Drinking water is easy right? It's also really beneficial for your health so keep a glass beside you all day and take sips often. It gives you energy and prevents you from boredom eating.  Do some floor exercises. The last thing you want to do is exercise but it might be just the thing you need the most. Keep it simple and do exercises  that involve sitting or laying on the floor. Even the smallest of exercises release chemicals in the brain that make you feel good. Yoga stretches or core exercises are going to make you feel good with minimal effort.  Make your bed. Making your bed takes a few minutes but it's productive and you'll feel relieved when it's done. An unmade bed is a huge visual reminder that you're having an unproductive day. Do it and consider it your housework for the day.  Put on some nice clothes. Take the sweatpants off even if you don't plan to go anywhere. Put on clothes that make you feel good. Take a look in the mirror so your brain recognizes the sweatpants have been replaced with clothes that make you look great. It's an instant confidence booster.  Wash the dishes. A pile of dirty dishes in the sink is a reflection of your mood. It's possible that if you wash up the dishes, your mood will follow suit. It's worth a try.  Cook a real meal. If you have the luxury to have a "do nothing" day, you have time to make a real meal for yourself. Make a meal that you love to eat. The process is good to get you out of the funk and the food will ensure you have more energy for tomorrow.  Write out your thoughts by hand. When you hand write, you stimulate your brain to focus on the moment that you're in so make yourself comfortable and write whatever comes into your mind. Put those thoughts out on paper so they stop spinning around in your head. Those thoughts might be the very thing holding you down.        Follow up:  Patient agrees to Care Plan and Follow-up.  Plan: The Managed Medicaid care management team will reach out to the patient again over the next 45 days.  Date of next scheduled Social Work care management/care coordination outreach:  10/25/21  Dickie La, BSW, MSW, LCSW Managed Medicaid LCSW [facility]  [facility] [email or web address] Phone: [phone]

## 2021-09-23 NOTE — Patient Instructions (Signed)
Visit Information  [name] was given information about Medicaid Managed Care team care coordination services as a part of their Healthy [facility] Medicaid benefit. Kimberly Farmer verbally consented to engagement with the [facility] Managed Care team.   If you are experiencing a medical emergency, please call 911 or report to your local emergency department or urgent care.   If you have a non-emergency medical problem during routine business hours, please contact your provider's office and ask to speak with a nurse.   For questions related to your Healthy [facility] health plan, please call: [phone] or visit the homepage here: [email or web address]  If you would like to schedule transportation through your Healthy [facility] plan, please call the following number at least 2 days in advance of your appointment: [phone]  Call the [facility] Crisis Line at [phone], at any time, 24 hours a day, 7 days a week. If you are in danger or need immediate medical attention call 911.  If you would like help to quit smoking, call 1-800-QUIT-NOW (707-098-3324) OR Espaol: 1-855-Djelo-Ya (3-557-322-0254) o para ms informacin haga clic aqu or Text READY to 270-623 to register via text  Kimberly Farmer - following are the goals we discussed in your visit today:   Goals Addressed   None     Following is a copy of your plan of care:  Care Plan : General Social Work (Adult)  Updates made by Gustavus Bryant, LCSW since 09/23/2021 12:00 AM     Problem: Anxiety Identification (Anxiety)      Long-Range Goal: Manage My Emotions   Start Date: 09/06/2021  Recent Progress: On track  Priority: High  Note:   Timeframe:  Long-Range Goal Priority:  High Start Date:    09/06/21                        Expected End Date:  ongoing                      Follow Up Date 10/25/21   - begin personal counseling - call and visit an old friend - check out  volunteer opportunities - join a support group - laugh; watch a funny movie or comedian - learn and use visualization or guided imagery - perform a random act of kindness - practice relaxation or meditation daily - start or continue a personal journal - talk about feelings with a friend, family or spiritual advisor - practice positive thinking and self-talk    Why is this important?   When you are stressed, down or upset, your body reacts too.  For example, your blood pressure may get higher; you may have a headache or stomachache.  When your emotions get the best of you, your body's ability to fight off cold and flu gets weak.  These steps will help you manage your emotions.     Current barriers:   Chronic Mental Health needs related to Postpartum depression, depression and anxiety Mental Health Concerns  Needs Support, Education, and Care Coordination in order to meet unmet mental health needs. Clinical Goal(s): demonstrate a reduction in symptoms related to :Anxiety with Excessive Worry, Panic Symptoms,  verbalize understanding of plan for management of Anxiety and Depression  explore community resource options for unmet needs related to:  Stress  Clinical Interventions:  Assessed patient's previous and current treatment, coping skills, support system and barriers to care  Motivational Interviewing employed Depression screen reviewed  Mindfulness or Relaxation  training provided Active listening / Reflection utilized  Emotional Support Provided Behavioral Activation reviewed Problem Solving /Task Center strategies reviewed Provided psychoeducation for mental health needs  Provided brief CBT  Participation in counseling encouraged  Discussed Health Care Power of Attorney  Made referral to [facility] on 09/06/21  ; Review resources, discussed options and provided patient information about  Options for mental health treatment based on need and insurance Email sent securely to patient  on 09/06/21 with counseling options Patient lives in a house with her spouse and 3 young children. She DOES NOT want medication management and prefers therapy only. Patient would like to gain virtual therapy if able due to being the sole caretaker of her 3 children. Referral made to [facility] for counseling.  Inter-disciplinary care team collaboration (see longitudinal plan of care) Update 09/23/21- Patient has upcoming appointment at [facility] for counseling on 10/24/21. She reports that this appointment is virtual. Patient states that she is doing well and implementing appropriate self-care. Patient request assistance with breast bump supplies. [facility] LCSW will make referral to [facility] BSW for this.  Patient Goals/Self-Care Activities: Over the next 120 days connect with provider for ongoing mental health treatment.   Increase coping skills, healthy habits, self-management skills, and stress reduction Call [employer]   10 LITTLE Things To Do When You're Feeling Too Down To Do Anything  Take a shower. Even if you plan to stay in all day long and not see a soul, take a shower. It takes the most effort to hop in to the shower but once you do, you'll feel immediate results. It will wake you up and you'll be feeling much fresher (and cleaner too).  Brush and floss your teeth. Give your teeth a good brushing with a floss finish. It's a small task but it feels so good and you can check 'taking care of your health' off the list of things to do.  Do something small on your list. Most of [place] have some small thing we would like to get done (load of laundry, sew a button, email a friend). Doing one of these things will make you feel like you've accomplished something.  Drink water. Drinking water is easy right? It's also really beneficial for your health so keep a glass beside you all day and take sips often. It gives you energy and prevents you from boredom eating.  Do some floor exercises. The last thing you want to do is  exercise but it might be just the thing you need the most. Keep it simple and do exercises that involve sitting or laying on the floor. Even the smallest of exercises release chemicals in the brain that make you feel good. Yoga stretches or core exercises are going to make you feel good with minimal effort.  Make your bed. Making your bed takes a few minutes but it's productive and you'll feel relieved when it's done. An unmade bed is a huge visual reminder that you're having an unproductive day. Do it and consider it your housework for the day.  Put on some nice clothes. Take the sweatpants off even if you don't plan to go anywhere. Put on clothes that make you feel good. Take a look in the mirror so your brain recognizes the sweatpants have been replaced with clothes that make you look great. It's an instant confidence booster.  Wash the dishes. A pile of dirty dishes in the sink is a reflection of your mood. It's possible that if you wash up  the dishes, your mood will follow suit. It's worth a try.  Cook a real meal. If you have the luxury to have a "do nothing" day, you have time to make a real meal for yourself. Make a meal that you love to eat. The process is good to get you out of the funk and the food will ensure you have more energy for tomorrow.  Write out your thoughts by hand. When you hand write, you stimulate your brain to focus on the moment that you're in so make yourself comfortable and write whatever comes into your mind. Put those thoughts out on paper so they stop spinning around in your head. Those thoughts might be the very thing holding you down.   Dickie La, BSW, MSW, [employer] Managed Medicaid LCSW [facility]  [facility] [email or web address] Phone: [phone]

## 2021-10-03 ENCOUNTER — Other Ambulatory Visit: Payer: Self-pay

## 2021-10-03 NOTE — Patient Outreach (Signed)
Medicaid Managed Care Social Work Note  10/03/2021 Name:  Kimberly Farmer MRN:  [id number] DOB:  September 01, 1996  Kimberly Farmer is an 25 y.o. year old female who is a primary patient of Kimberly Sprout, FNP.  The [facility] Managed Care Coordination team was consulted for assistance with:   Breast Pump Supplies  [name] was given information about Medicaid Managed Care Coordination team services today. Kimberly Farmer Patient agreed to services and verbal consent obtained.  Engaged with patient  for by telephone forinitial visit in response to referral for case management and/or care coordination services.   Assessments/Interventions:  Review of past medical history, allergies, medications, health status, including review of consultants reports, laboratory and other test data, was performed as part of comprehensive evaluation and provision of chronic care management services.  SDOH: (Social Determinant of Health) assessments and interventions performed: BSW spoke with patient she stated she was able to sell some clothes to get the money and purchased some supplies from [employer]. BSW informed patient that since she does have [employer], a breast feeding support kit is an extra benefits that they offer. BSW provided patient with the telephone number for healthy blue. No other resources are needed at this time.   Advanced Directives Status:  Not addressed in this encounter.  Care Plan                 Allergies  Allergen Reactions   Hydrocodone Other (See Comments)    Other Reaction: SEVERE HA & GI Upset   Oxycodone Other (See Comments)    Other Reaction: SEVERE HA & GI Upset   Tape Rash    Medications Reviewed Today     Reviewed by Kimberly Sprout, FNP (Family Nurse Practitioner) on 09/01/21 at 1058  Med List Status: <None>   Medication Order Taking? Sig Documenting Provider Last Dose Status Informant  Patient not taking:  Discontinued 09/01/21 1056   Patient not taking:  Discontinued  09/01/21 1056   etonogestrel-ethinyl estradiol (NUVARING) 0.12-0.015 MG/24HR vaginal ring [id number]  Take a home pregnancy test prior to insertion Insert vaginally and leave in place for 3 consecutive weeks, then remove for 1 week. Kimberly Sprout, FNP  Active   Patient not taking:  Discontinued 09/01/21 1056     Discontinued 09/01/21 1058   Patient not taking:  Discontinued 09/01/21 1056   Patient not taking:  Discontinued 09/01/21 1056   Patient not taking:  Discontinued 09/01/21 1057   Patient not taking:  Discontinued 09/01/21 1057 Patient not taking:  Discontinued 09/01/21 1056   Patient not taking:  Discontinued 09/01/21 1056   Patient not taking:  Discontinued 09/01/21 1056   Patient not taking:  Discontinued 09/01/21 1057             Patient Active Problem List   Diagnosis Date Noted   Anxiety and depression 09/01/2021   Knee locking, left 09/01/2021   Class 3 severe obesity due to excess calories without serious comorbidity with body mass index (BMI) of 40.0 to 44.9 in adult ([place]) 09/01/2021   Urge incontinence 09/01/2021   Migraine without aura and without status migrainosus, not intractable 05/11/2021   PCOS (polycystic ovarian syndrome) 03/05/2018   Chronic midline low back pain without sciatica 04/24/2016   Airway hyperreactivity 09/27/2015   F/H of alcoholism 09/27/2015   Bone disease 09/24/2014   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   Follow up:  Patient agrees to Care Plan and Follow-up.  Plan: The  Patient has been provided with contact information for the Managed Medicaid care management team and has been advised to call with any health related questions or concerns.    Mickel Fuchs, BSW, [place] Managed Medicaid Team  [phone]

## 2021-10-03 NOTE — Patient Instructions (Signed)
Visit Information  Kimberly Farmer was given information about Medicaid Managed Care team care coordination services as a part of their Healthy [facility] Medicaid benefit. Kimberly Farmer verbally consented to engagement with the [facility] Managed Care team.   If you are experiencing a medical emergency, please call 911 or report to your local emergency department or urgent care.   If you have a non-emergency medical problem during routine business hours, please contact your provider's office and ask to speak with a nurse.   For questions related to your Healthy [facility] health plan, please call: [phone] or visit the homepage here: [email or web address]  If you would like to schedule transportation through your Healthy [facility] plan, please call the following number at least 2 days in advance of your appointment: [phone]  Call the [facility] Crisis Line at [phone], at any time, 24 hours a day, 7 days a week. If you are in danger or need immediate medical attention call 911.  If you would like help to quit smoking, call 1-800-QUIT-NOW (207 215 1306) OR Espaol: 1-855-Djelo-Ya (4-431-540-0867) o para ms informacin haga clic aqu or Text READY to 619-509 to register via text  Kimberly Farmer - following are the goals we discussed in your visit today:   Goals Addressed   None      The  Patient                                              has been provided with contact information for the Managed Medicaid care management team and has been advised to call with any health related questions or concerns.   Kimberly Farmer, BSW, [name]   [place]  High Risk Managed Medicaid Team  [phone]   Following is a copy of your plan of care:  There are no care plans that you recently modified to display for this patient.

## 2021-10-24 ENCOUNTER — Other Ambulatory Visit: Payer: Self-pay

## 2021-10-24 ENCOUNTER — Ambulatory Visit (INDEPENDENT_AMBULATORY_CARE_PROVIDER_SITE_OTHER): Payer: Medicaid Other | Admitting: Licensed Clinical Social Worker

## 2021-10-24 ENCOUNTER — Telehealth: Payer: Self-pay

## 2021-10-24 ENCOUNTER — Encounter: Payer: Self-pay | Admitting: Licensed Clinical Social Worker

## 2021-10-24 DIAGNOSIS — F4323 Adjustment disorder with mixed anxiety and depressed mood: Secondary | ICD-10-CM

## 2021-10-24 NOTE — Plan of Care (Signed)
Developed tx plan with pt input  

## 2021-10-24 NOTE — Telephone Encounter (Signed)
Appointment - Patient was seen by Kimberly Farmer earlier today and discussed possible options to start on medications.  Pt now wants to set up an appointment with one of the adult providers as a new pt to discuss options that may work for her and that would not interfere with her breast milk.  Agreed to send a message to have admin staff call patient back to scheduled a first evaluation with one of the office psychiatrists.
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
## 2021-10-25 ENCOUNTER — Encounter: Payer: Self-pay | Admitting: Family Medicine

## 2021-10-25 ENCOUNTER — Other Ambulatory Visit: Payer: Self-pay | Admitting: Licensed Clinical Social Worker

## 2021-10-25 ENCOUNTER — Telehealth: Payer: Self-pay | Admitting: Licensed Clinical Social Worker

## 2021-10-25 NOTE — Patient Outreach (Signed)
°  Medicaid Managed Care Social Work Note  10/25/2021 Name:  ALEXSANDRIA KIVETT MRN:  [id number] DOB:  1996-09-22  [name] is an 25 y.o. year old female who is a primary patient of Jacky Kindle, FNP.  The [facility] Managed Care Coordination team was consulted for assistance with:  Mental Health Counseling and Resources   Two attempts made today:  F/U phone call today to assess needs, progress and barriers with care plan goals.   Telephone outreach was unsuccessful. Unable to leave a HIPPA compliant phone message due to voice mail not set up.  Plan: Will attempt to contact patient later on today  Will route chart to Care Guide to see if patient would like to reschedule phone appointment if unable to reach her.  Review of patient status, including review of consultants reports, relevant laboratory and other test results, and collaboration with appropriate care team members and the patient's provider was performed as part of comprehensive patient evaluation and provision of care management services.     Sammuel Hines, LCSW Licensed Clinical Social Worker [facility]  (Coverage for MM team) [phone]

## 2021-10-25 NOTE — Patient Outreach (Signed)
Medicaid Managed Care Social Work Note  10/25/2021 Name:  Kimberly Farmer MRN:  [id number] DOB:  October 02, 1996  Kimberly Farmer is an 26 y.o. year old female who is a primary patient of Jacky Kindle, FNP.  The Medicaid Managed Care Coordination team was consulted for assistance with:  Community Resources  Mental Health Counseling and Resources  [name] was given information about Medicaid Managed Care Coordination team services today. Kimberly Farmer Patient agreed to services and verbal consent obtained.  Engaged with patient  for by telephone forfollow up visit in response to referral for case management and/or care coordination services.   Assessments/Interventions:  Review of past medical history, allergies, medications, health status, including review of consultants reports, laboratory and other test data, was performed as part of comprehensive evaluation and provision of chronic care management services.  SDOH: (Social Determinant of Health) assessments and interventions performed: SDOH Interventions    Flowsheet Row Most Recent Value  SDOH Interventions   Food Insecurity Interventions --  [has food stamps]  Stress Interventions Provide Counseling  [Emmi education]  Transportation Interventions Other (Comment)  [discussed transportation via medicaid]       Advanced Directives Status:  Not addressed in this encounter.  Care Plan              Conditions to be addressed/monitored per PCP order:  Anxiety and stress  Care Plan : General Social Work (Adult)  Updates made by Soundra Pilon, LCSW since 10/25/2021 12:00 AM     Problem: Anxiety Identification (Anxiety)      Long-Range Goal: Manage My Emotions Completed 10/25/2021  Start Date: 09/06/2021  This Visit's Progress: On track  Recent Progress: On track  Priority: High  Note:   Current Barriers:  Disease Management support and education needs related to Caregiver Stress and Stress at home  CSW Clinical  Goal(s):  Patient  will work with therapist at [facility] to address needs related to managing symptoms of anxiety and stress  through collaboration with Clinical Child psychotherapist, provider, and care team.   Interventions: 1:1 collaboration with primary care provider regarding development and update of comprehensive plan of care as evidenced by provider attestation and co-signature Inter-disciplinary care team collaboration (see longitudinal plan of care) Evaluation of current treatment plan related to  self management and patient's adherence to plan as established by provider Review resources, discussed options and provided patient information about  Department of Social Services ( services currently receiving ) Transportation provided by insurance provider Enhanced Benefits connected with insurance provider  [facility] program Financial hardship resource options Has applied for Disability for two sons Legal Aid Helpline Toll-free: [phone])  Mental Health:  (Status: Goal on Track (progressing): YES. Patient declined further engagement on this goal.) She has connected with her therapist and has a 2nd appointment scheduled for next month. Evaluation of current treatment plan related to Caregiver Stress, Stress at home, and symptoms of anxiety Active listening / Reflection utilized  Emotional Support Provided Problem Solving /Task Center strategies reviewed Caregiver stress acknowledged  Provided EMMI education information on Relieving Stress and Managing Anxiety around daily task Collaborated with MM team for options to assist with new moms for rewards and breast feeding [phone])  Patient Self-Care Activities: Call your insurance provider to discuss transportation options Call your insurance provider for more information about your Enhanced Benefits  Continue with therapy Review your EMMI educational information (Relieving Stress and Managing Anxiety Around Daily Task) Look for  an e-mail from  [facility]. Call Legal Aid for assistance to answer your questions [phone]  Call your insurance provider at [phone]  they has resources to help new mom with breastfeeding options  and rewards up to $50.00     Follow up:  Patient requests no follow-up at this time.  Plan: The  Patient has been provided with contact information for the Managed Medicaid care management team and has been advised to call with any health related questions or concerns.  Date/time of next scheduled Social Work care management/care coordination outreach:  None  Sammuel Hines, LCSW Licensed Clinical Social Worker [facility]  MM Team coverage [phone]

## 2021-10-25 NOTE — Patient Instructions (Signed)
Visit Information  Kimberly Farmer was given information about Medicaid Managed Care team care coordination services as a part of their Healthy [facility] Medicaid benefit. Kimberly Farmer verbally consented to engagement with the [facility] Managed Care team.   If you are experiencing a medical emergency, please call 911 or report to your local emergency department or urgent care.   If you have a non-emergency medical problem during routine business hours, please contact your provider's office and ask to speak with a nurse.   For questions related to your Healthy [facility] health plan, please call: [phone] or visit the homepage here: [email or web address]  If you would like to schedule transportation through your Healthy [facility] plan, please call the following number at least 2 days in advance of your appointment: [phone]  Call the [facility] Crisis Line at [phone], at any time, 24 hours a day, 7 days a week. If you are in danger or need immediate medical attention call 911.  If you would like help to quit smoking, call 1-800-QUIT-NOW (313-868-6002) OR Espaol: 1-855-Djelo-Ya (7-793-903-0092) o para ms informacin haga clic aqu or Text READY to 330-076 to register via text  Kimberly Farmer - following are the goals we discussed in your visit today:    Following is a copy of your plan of care:  Care Plan : General Social Work (Adult)  Updates made by Soundra Pilon, LCSW since 10/25/2021 12:00 AM     Problem: Anxiety Identification (Anxiety)      Long-Range Goal: Manage My Emotions Completed 10/25/2021  Start Date: 09/06/2021  This Visit's Progress: On track  Recent Progress: On track  Priority: High  Note:   Current Barriers:  Disease Management support and education needs related to Caregiver Stress and Stress at home  CSW Clinical Goal(s):  Patient  will work with therapist at [facility] to address needs related to managing  symptoms of anxiety and stress  through collaboration with Clinical Child psychotherapist, provider, and care team.   Interventions: 1:1 collaboration with primary care provider regarding development and update of comprehensive plan of care as evidenced by provider attestation and co-signature Inter-disciplinary care team collaboration (see longitudinal plan of care) Evaluation of current treatment plan related to  self management and patient's adherence to plan as established by provider Review resources, discussed options and provided patient information about  Department of Social Services ( services currently receiving ) Transportation provided by insurance provider Enhanced Benefits connected with insurance provider  [facility] program Financial hardship resource options Has applied for Disability for two sons Legal Aid Helpline Toll-free: [phone])  Mental Health:  (Status: Goal on Track (progressing): YES. Patient declined further engagement on this goal.) She has connected with her therapist and has a 2nd appointment scheduled for next month. Evaluation of current treatment plan related to Caregiver Stress, Stress at home, and symptoms of anxiety Active listening / Reflection utilized  Emotional Support Provided Problem Solving /Task Center strategies reviewed Caregiver stress acknowledged  Provided EMMI education information on Relieving Stress and Managing Anxiety around daily task Collaborated with MM team for options to assist with new moms for rewards and breast feeding [phone])  Patient Self-Care Activities: Call your insurance provider to discuss transportation options Call your insurance provider for more information about your Enhanced Benefits  Continue with therapy Review your EMMI educational information (Relieving Stress and Managing Anxiety Around Daily Task) Look for an e-mail from [facility]. Call Legal Aid for assistance to answer your  questions  [phone]  Call your insurance provider at [phone]  they has resources to help new mom with breastfeeding options  and rewards up to $50.00       It was a pleasure speaking with you today. No follow up scheduled, per our conversation you do not desire continued follow up Patient's caregiver verbalizes understanding of instructions provided today and agrees to view in MyChart.  Sammuel Hines, LCSW Care Management & Coordination  [phone]

## 2021-11-25 ENCOUNTER — Other Ambulatory Visit: Payer: Self-pay

## 2021-11-25 ENCOUNTER — Encounter: Payer: Self-pay | Admitting: Family Medicine

## 2021-11-25 ENCOUNTER — Telehealth (INDEPENDENT_AMBULATORY_CARE_PROVIDER_SITE_OTHER): Payer: 59 | Admitting: Family Medicine

## 2021-11-25 VITALS — Wt 226.0 lb

## 2021-11-25 DIAGNOSIS — F32A Depression, unspecified: Secondary | ICD-10-CM | POA: Diagnosis not present

## 2021-11-25 DIAGNOSIS — F419 Anxiety disorder, unspecified: Secondary | ICD-10-CM

## 2021-11-25 MED ORDER — FLUOXETINE HCL 20 MG PO TABS: 20.0000 mg | ORAL_TABLET | Freq: Every day | ORAL | 3 refills | Status: DC

## 2021-11-25 MED ORDER — BUPROPION HCL ER (SR) 150 MG PO TB12: 150.0000 mg | ORAL_TABLET | Freq: Every day | ORAL | 3 refills | Status: DC

## 2021-11-25 NOTE — Assessment & Plan Note (Signed)
Start SSRI and wellbutrin Has taken autistic son out of pre-k to start private services Reports lack of support at home Discussed potential side effects, incl GI upset, sexual dysfunction, increased anxiety, and SI Discussed that it can take 6-8 weeks to reach full efficacy Contracted for safety - no SI/HI Discussed synergistic effects of medications and therapy

## 2021-11-25 NOTE — Assessment & Plan Note (Signed)
Pumping at this time OK to take both medications and continue lactation if desired

## 2021-11-25 NOTE — Progress Notes (Signed)
MyChart Video Visit    Virtual Visit via Video Note   This visit type was conducted due to national recommendations for restrictions regarding the COVID-19 Pandemic (e.g. social distancing) in an effort to limit this patient's exposure and mitigate transmission in our community. This patient is at least at moderate risk for complications without adequate follow up. This format is felt to be most appropriate for this patient at this time. Physical exam was limited by quality of the video and audio technology used for the visit.   Patient location: home Provider location: [facility]  I discussed the limitations of evaluation and management by telemedicine and the availability of in person appointments. The patient expressed understanding and agreed to proceed.  Patient: Kimberly Farmer   DOB: May 28, 1996   25 y.o. Female  MRN: [id number] Visit Date: 11/25/2021  Today's healthcare provider: Jacky Kindle, FNP   Chief Complaint  Patient presents with   Anxiety   I,Sulibeya S Dimas,acting as a scribe for Jacky Kindle, FNP.,have documented all relevant documentation on the behalf of Jacky Kindle, FNP,as directed by  Jacky Kindle, FNP while in the presence of Jacky Kindle, FNP.  Subjective    Anxiety Symptoms include nervous/anxious behavior. Patient reports no chest pain, palpitations or shortness of breath.     Anxiety Patient requesting to start medication for anxiety. She reports talking to therapist who did suggest starting medication. However at that time she was breastfeeding. Patient reports she is still pumping and has started baby on formula. She reports having three boys one who is autistic.    She feels her anxiety is mild and Unchanged since last visit.  Symptoms: No chest pain No difficulty concentrating  No dizziness No fatigue  No feelings of losing control No insomnia  No irritable No palpitations  No panic attacks No racing thoughts  No shortness of breath No  sweating  No tremors/shakes    GAD-7 Results GAD-7 Generalized Anxiety Disorder Screening Tool 11/25/2021  1. Feeling Nervous, Anxious, or on Edge 2  2. Not Being Able to Stop or Control Worrying 0  3. Worrying Too Much About Different Things 1  4. Trouble Relaxing 1  5. Being So Restless it's Hard To Sit Still 1  6. Becoming Easily Annoyed or Irritable 0  7. Feeling Afraid As If Something Awful Might Happen 0  Total GAD-7 Score 5  Difficulty At Work, Home, or Getting  Along With Others? Not difficult at all  Some encounter information is confidential and restricted. Go to Review Flowsheets activity to see all data.    PHQ-9 Scores PHQ9 SCORE ONLY 11/25/2021 12/01/2019  PHQ-9 Total Score 2 0  Some encounter information is confidential and restricted. Go to Review Flowsheets activity to see all data.    ---------------------------------------------------------------------------------------------------    Medications: Outpatient Medications Prior to Visit  Medication Sig   Cholecalciferol (VITAMIN D) 50 MCG (2000 UT) CAPS Take 1 capsule by mouth daily.   vitamin B-12 (CYANOCOBALAMIN) 1000 MCG tablet Take by mouth daily.   etonogestrel-ethinyl estradiol (NUVARING) 0.12-0.015 MG/24HR vaginal ring Take a home pregnancy test prior to insertion Insert vaginally and leave in place for 3 consecutive weeks, then remove for 1 week. (Patient not taking: Reported on 11/25/2021)   No facility-administered medications prior to visit.    Review of Systems  Constitutional:  Positive for activity change. Negative for fatigue.  Respiratory:  Negative for chest tightness and shortness of breath.   Cardiovascular:  Negative for chest pain and palpitations.  Psychiatric/Behavioral:  The patient is nervous/anxious.       Objective    Wt 226 lb (102.5 kg) Comment: stated   Breastfeeding No Comment: still pumping for now.   BMI 35.93 kg/m  BP Readings from Last 3 Encounters:  06/06/21 130/79   03/05/21 (!) 104/53  02/23/21 106/72   Wt Readings from Last 3 Encounters:  11/25/21 226 lb (102.5 kg)  06/06/21 253 lb 3.2 oz (114.9 kg)  03/01/21 271 lb 1.6 oz (123 kg)      Physical Exam Constitutional:      Appearance: She is obese.  Pulmonary:     Effort: Pulmonary effort is normal.  Neurological:     Mental Status: She is alert.  Psychiatric:        Mood and Affect: Mood is anxious. Affect is tearful.        Speech: Speech is rapid and pressured.        Thought Content: Thought content normal. Thought content does not include suicidal ideation. Thought content does not include suicidal plan.        Judgment: Judgment normal.       Assessment & Plan     Problem List Items Addressed This Visit       Other   Anxiety and depression - Primary    Start SSRI and wellbutrin Has taken autistic son out of pre-k to start private services Reports lack of support at home Discussed potential side effects, incl GI upset, sexual dysfunction, increased anxiety, and SI Discussed that it can take 6-8 weeks to reach full efficacy Contracted for safety - no SI/HI Discussed synergistic effects of medications and therapy       Relevant Medications   FLUoxetine (PROZAC) 20 MG tablet   buPROPion (WELLBUTRIN SR) 150 MG 12 hr tablet   Lactating mother    Pumping at this time OK to take both medications and continue lactation if desired      Morbid obesity (HCC)    BMI >35 Discussed importance of healthy weight management Discussed diet and exercise         Return in about 2 months (around 01/23/2022) for anxiety and depression.     I discussed the assessment and treatment plan with the patient. The patient was provided an opportunity to ask questions and all were answered. The patient agreed with the plan and demonstrated an understanding of the instructions.   The patient was advised to call back or seek an in-person evaluation if the symptoms worsen or if the condition  fails to improve as anticipated.  I provided 17 minutes of non-face-to-face time during this encounter.  Leilani Merl, FNP, have reviewed all documentation for this visit. The documentation on 11/25/21 for the exam, diagnosis, procedures, and orders are all accurate and complete.   Jacky Kindle, FNP [facility] [phone] (phone) [phone] (fax)  [facility]

## 2021-11-25 NOTE — Assessment & Plan Note (Signed)
BMI 35 Discussed importance of healthy weight management Discussed diet and exercise  

## 2021-11-29 ENCOUNTER — Ambulatory Visit (INDEPENDENT_AMBULATORY_CARE_PROVIDER_SITE_OTHER): Payer: Medicaid Other | Admitting: Licensed Clinical Social Worker

## 2021-11-29 ENCOUNTER — Other Ambulatory Visit: Payer: Self-pay

## 2021-11-29 DIAGNOSIS — F4323 Adjustment disorder with mixed anxiety and depressed mood: Secondary | ICD-10-CM | POA: Diagnosis not present

## 2021-11-29 NOTE — Progress Notes (Signed)
Virtual Visit via Video Note  I connected with Kimberly Farmer on 11/29/21 at  2:00 PM EST by a video enabled telemedicine application and verified that I am speaking with the correct person using two identifiers.  Location: Patient: home Provider: remote office [place], [place])   I discussed the limitations of evaluation and management by telemedicine and the availability of in person appointments. The patient expressed understanding and agreed to proceed.   I discussed the assessment and treatment plan with the patient. The patient was provided an opportunity to ask questions and all were answered. The patient agreed with the plan and demonstrated an understanding of the instructions.   The patient was advised to call back or seek an in-person evaluation if the symptoms worsen or if the condition fails to improve as anticipated.  I provided 45 minutes of non-face-to-face time during this encounter.   Kimberly Escutia R Chriss Redel, LCSW  THERAPIST PROGRESS NOTE  Session Time: 2:15-3p  Participation Level: Minimal  Behavioral Response: NeatAlertAnxious  Type of Therapy: Individual Therapy  Treatment Goals addressed:   ProgressTowards Goals: Progressing  Goal: LTG: Reduce frequency, intensity, and duration of depression symptoms as evidenced by: SSB input needed on appropriate metric  Outcome: Progressing Goal: LTG: Patient will score less than 5 on the Generalized Anxiety Disorder 7 Scale (GAD-7)  Outcome: Progressing  Goal: STG: Patient will participate in at least 80% of scheduled individual psychotherapy sessions  Outcome: Progressing  Interventions: CBT and Solution Focused Intervention: Encourage self-care activities  Intervention: Encourage family support  Intervention: Encourage compliance with prescribed medication regimen  Intervention: Encourage patient to identify triggers  Intervention: Monitor coping skills and behavior  Intervention: Assist with relaxation  techniques, as appropriate (deep breathing exercises, meditation, guided imagery)  Summary: Kimberly Farmer is a 27 y.o. female who presents with symptoms consistent with adjustment disorder/anxiety/depression. Pt reports that PCP recently started on bupropion and fluoxetine. Pt reports that overall mood has been stable and that she is still experiencing some stress/anxiety.  Allowed pt to explore and express thoughts and feelings associated with recent life situations and external stressors. Discussed parenting stress, seeking ways of balancing life better, focusing on finding time for self care, and reaching out for external supports when needed. Pt reports that she has a best friend that is very helpful and that her mother is helpful as well.   Continued recommendations are as follows: self care behaviors, positive social engagements, focusing on overall work/home/life balance, and focusing on positive physical and emotional wellness.   Suicidal/Homicidal: No  Therapist Response: Pt is continuing to apply interventions learned in session into daily life situations. Pt is currently on track to meet goals utilizing interventions mentioned above. Personal growth and progress noted. Treatment to continue as indicated.    Plan: Return again in 4 weeks.  Diagnosis: Adjustment disorder with mixed anxiety and depressed mood  Collaboration of Care: Other pt receiving medication management from PCP  Patient/Guardian was advised Release of Information must be obtained prior to any record release in order to collaborate their care with an outside provider. Patient/Guardian was advised if they have not already done so to contact the registration department to sign all necessary forms in order for [place] to release information regarding their care.   Consent: Patient/Guardian gives verbal consent for treatment and assignment of benefits for services provided during this telehealth visit. Patient/Guardian  expressed understanding and agreed to proceed.   Kimberly Haber Erastus Bartolomei, LCSW 11/29/2021

## 2021-11-29 NOTE — Plan of Care (Signed)
°  Problem: Depression CCP Problem  1 Decrease depressive symptoms and improve levels of effective functioning  Goal: LTG: Reduce frequency, intensity, and duration of depression symptoms as evidenced by: SSB input needed on appropriate metric Outcome: Progressing Goal: STG: Emilyanne WILL PARTICIPATE IN AT LEAST 80% OF SCHEDULED INDIVIDUAL PSYCHOTHERAPY SESSIONS Outcome: Progressing Intervention: Encourage self-care activities Intervention: Encourage family support Intervention: Encourage compliance with prescribed medication regimen   Problem: Anxiety Disorder CCP Problem  1 Reduce overall frequency, intensity, and duration of the anxiety so that daily functioning is not impaired. Goal: LTG: Patient will score less than 5 on the Generalized Anxiety Disorder 7 Scale (GAD-7) Outcome: Progressing Goal: STG: Patient will participate in at least 80% of scheduled individual psychotherapy sessions Outcome: Progressing Intervention: Encourage patient to identify triggers Intervention: Monitor coping skills and behavior Intervention: Assist with relaxation techniques, as appropriate (deep breathing exercises, meditation, guided imagery)

## 2021-11-30 NOTE — Plan of Care (Signed)
°  Problem: Depression CCP Problem  1 Decrease depressive symptoms and improve levels of effective functioning  °Goal: LTG: Reduce frequency, intensity, and duration of depression symptoms as evidenced by: SSB input needed on appropriate metric °Outcome: Progressing °Goal: STG: Dhruvi WILL PARTICIPATE IN AT LEAST 80% OF SCHEDULED INDIVIDUAL PSYCHOTHERAPY SESSIONS °Outcome: Progressing °Intervention: Encourage self-care activities °Intervention: Encourage family support °Intervention: Encourage compliance with prescribed medication regimen °  °Problem: Anxiety Disorder CCP Problem  1 Reduce overall frequency, intensity, and duration of the anxiety so that daily functioning is not impaired. °Goal: LTG: Patient will score less than 5 on the Generalized Anxiety Disorder 7 Scale (GAD-7) °Outcome: Progressing °Goal: STG: Patient will participate in at least 80% of scheduled individual psychotherapy sessions °Outcome: Progressing °Intervention: Encourage patient to identify triggers °Intervention: Monitor coping skills and behavior °Intervention: Assist with relaxation techniques, as appropriate (deep breathing exercises, meditation, guided imagery) °  °

## 2021-12-22 NOTE — Progress Notes (Deleted)
? ?Acute Office Visit ? ?Subjective:  ? ? Patient ID: Kimberly Farmer, female    DOB: 1996/03/10, 26 y.o.   MRN: [id number] ? ?No chief complaint on file. ? ? ?HPI ?Patient is in today for knot on neck. ? ?Past Medical History:  ?Diagnosis Date  ? Arthritis   ? Bilateral knees and back  ? Breakthrough bleeding on birth control pills 05/16/2018  ? Cesarean wound infection 03/18/2019  ? Choroid plexus cysts, fetal, affecting care of mother, antepartum 09/17/2018  ? Gestational hypertension 03/01/2021  ? Lumbar disc disease   ? 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 PCOS (polycystic ovarian syndrome)   ? Post-dates pregnancy 02/21/2019  ? Pregnancy induced hypertension   ? ? ?Past Surgical History:  ?Procedure Laterality Date  ? CESAREAN SECTION N/A 02/21/2019  ? Procedure: CESAREAN SECTION;  Surgeon: Nadara Mustard, MD;  Location: [facility] ORS;  Service: Obstetrics;  Laterality: N/A;  ? CESAREAN SECTION N/A 03/02/2021  ? Procedure: CESAREAN SECTION;  Surgeon: Tereso Newcomer, MD;  Location: [facility];  Service: Obstetrics;  Laterality: N/A;  ? KNEE SURGERY Left 2011  ? [name] has had 5 surgeries on her knee  ? TONSILLECTOMY AND ADENOIDECTOMY  2007  ? ? ?Family History  ?Problem Relation Age of Onset  ? Hypertension Mother   ? Alcohol abuse Father   ? Diabetes Maternal Grandmother   ? Hypertension Maternal Grandmother   ? Cancer Paternal Grandfather   ?     skin  ? ? ?Social History  ? ?Socioeconomic History  ? Marital status: Single  ?  Spouse name: Not on file  ? Number of children: Not on file  ? Years of education: Not on file  ? Highest education level: Not on file  ?Occupational History  ? Not on file  ?Tobacco Use  ? Smoking status: Never  ? Smokeless tobacco: Never  ? Tobacco comments:  ?  Vape - Reduced Nicotine  ?Vaping Use  ? Vaping Use: Former  ? Substances: Nicotine  ?Substance and Sexual Activity  ? Alcohol use: No  ? Drug use: No  ? Sexual activity: Yes  ?  Partners: Male  ?  Birth control/protection: None  ?Other Topics  Concern  ? Not on file  ?Social History Narrative  ? Not on file  ? ?Social Determinants of Health  ? ?Financial Resource Strain: Low Risk   ? Difficulty of Paying Living Expenses: Not very hard  ?Food Insecurity: No Food Insecurity  ? Worried About Programme researcher, broadcasting/film/video in the Last Year: Never true  ? Ran Out of Food in the Last Year: Never true  ?Transportation Needs: Unmet Transportation Needs  ? Lack of Transportation (Medical): Yes  ? Lack of Transportation (Non-Medical): No  ?Physical Activity: Not on file  ?Stress: Stress Concern Present  ? Feeling of Stress : Rather much  ?Social Connections: Not on file  ?Intimate Partner Violence: Not on file  ? ? ?Outpatient Medications Prior to Visit  ?Medication Sig Dispense Refill  ? buPROPion (WELLBUTRIN SR) 150 MG 12 hr tablet Take 1 tablet (150 mg total) by mouth daily. 90 tablet 3  ? Cholecalciferol (VITAMIN D) 50 MCG (2000 UT) CAPS Take 1 capsule by mouth daily.    ? etonogestrel-ethinyl estradiol (NUVARING) 0.12-0.015 MG/24HR vaginal ring Take a home pregnancy test prior to insertion ?Insert vaginally and leave in place for 3 consecutive weeks, then remove for 1 week. (Patient not taking: Reported on 11/25/2021) 3 each 3  ?  FLUoxetine (PROZAC) 20 MG tablet Take 1 tablet (20 mg total) by mouth daily. 90 tablet 3  ? vitamin B-12 (CYANOCOBALAMIN) 1000 MCG tablet Take by mouth daily.    ? ?No facility-administered medications prior to visit.  ? ? ?Allergies  ?Allergen Reactions  ? Hydrocodone Other (See Comments)  ?  Other Reaction: SEVERE HA & GI Upset  ? Oxycodone Other (See Comments)  ?  Other Reaction: SEVERE HA & GI Upset  ? Tape Rash  ? ? ?Review of Systems ? ?   ?Objective:  ?  ?Physical Exam ? ?There were no vitals taken for this visit. ?Wt Readings from Last 3 Encounters:  ?11/25/21 226 lb (102.5 kg)  ?06/06/21 253 lb 3.2 oz (114.9 kg)  ?03/01/21 271 lb 1.6 oz (123 kg)  ? ? ?Health Maintenance Due  ?Topic Date Due  ? COVID-19 Vaccine (1) Never done  ? HPV  VACCINES (2 - 3-dose series) 05/06/2014  ? PAP-Cervical Cytology Screening  06/21/2021  ? PAP SMEAR-Modifier  06/21/2021  ? ? ?   ?Topic Date Due  ? HPV VACCINES (2 - 3-dose series) 05/06/2014  ? ? ? ?Lab Results  ?Component Value Date  ? TSH 0.883 08/25/2020  ? ?Lab Results  ?Component Value Date  ? WBC 10.6 (H) 03/03/2021  ? HGB 9.0 (L) 03/03/2021  ? HCT 29.3 (L) 03/03/2021  ? MCV 77.7 (L) 03/03/2021  ? PLT 255 03/03/2021  ? ?Lab Results  ?Component Value Date  ? NA 135 03/03/2021  ? K 4.0 03/03/2021  ? CO2 25 03/03/2021  ? GLUCOSE 71 03/03/2021  ? BUN <5 (L) 03/03/2021  ? CREATININE 0.59 03/03/2021  ? BILITOT 0.4 03/03/2021  ? ALKPHOS 81 03/03/2021  ? AST 18 03/03/2021  ? ALT 10 03/03/2021  ? PROT 5.3 (L) 03/03/2021  ? ALBUMIN 2.3 (L) 03/03/2021  ? CALCIUM 8.4 (L) 03/03/2021  ? ANIONGAP 4 (L) 03/03/2021  ? ?No results found for: CHOL ?No results found for: HDL ?No results found for: LDLCALC ?No results found for: TRIG ?No results found for: CHOLHDL ?Lab Results  ?Component Value Date  ? HGBA1C 5.6 02/23/2021  ? ? ?   ?Assessment & Plan:  ? ?Problem List Items Addressed This Visit   ?None ? ? ? ?No orders of the defined types were placed in this encounter. ? ? ? ?Sarina Ill, CMA ? ?

## 2021-12-23 ENCOUNTER — Ambulatory Visit: Payer: 59 | Admitting: Physician Assistant

## 2021-12-23 NOTE — Progress Notes (Deleted)
? ?Acute Office Visit ? ?Subjective:  ? ? Patient ID: Kimberly Farmer, female    DOB: 1996-01-09, 26 y.o.   MRN: [id number] ? ?No chief complaint on file. ? ? ?HPI ?Patient is in today for knot on neck.  Pt noticed on 12-18-21.  ? ? Anxiety and depression - Primary  ?    Start SSRI and wellbutrin ?Has taken autistic son out of pre-k to start private services ?Reports lack of support at home ?Discussed potential side effects, incl GI upset, sexual dysfunction, increased anxiety, and SI ?Discussed that it can take 6-8 weeks to reach full efficacy ?Contracted for safety - no SI/HI ?Discussed synergistic effects of medications and therapy ?  ?   ?  ?  Relevant Medications  ?  FLUoxetine (PROZAC) 20 MG tablet  ?  buPROPion (WELLBUTRIN SR) 150 MG 12 hr tablet  ?  Lactating mother  ?    Pumping at this time ?OK to take both medications and continue lactation if desired ?   ?  ?  Morbid obesity (HCC)  ?    BMI >35 ?Discussed importance of healthy weight management ?Discussed diet and exercise ?   ?  ?Fu with Robynn Pane on 4/23 ? ?Past Medical History:  ?Diagnosis Date  ? Arthritis   ? Bilateral knees and back  ? Breakthrough bleeding on birth control pills 05/16/2018  ? Cesarean wound infection 03/18/2019  ? Choroid plexus cysts, fetal, affecting care of mother, antepartum 09/17/2018  ? Gestational hypertension 03/01/2021  ? Lumbar disc disease   ? 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 PCOS (polycystic ovarian syndrome)   ? Post-dates pregnancy 02/21/2019  ? Pregnancy induced hypertension   ? ? ?Past Surgical History:  ?Procedure Laterality Date  ? CESAREAN SECTION N/A 02/21/2019  ? Procedure: CESAREAN SECTION;  Surgeon: Nadara Mustard, MD;  Location: [facility] ORS;  Service: Obstetrics;  Laterality: N/A;  ? CESAREAN SECTION N/A 03/02/2021  ? Procedure: CESAREAN SECTION;  Surgeon: Tereso Newcomer, MD;  Location: [facility];  Service: Obstetrics;  Laterality: N/A;  ? KNEE SURGERY Left 2011  ? Kimberly Farmer has had 5 surgeries on her knee  ? TONSILLECTOMY  AND ADENOIDECTOMY  2007  ? ? ?Family History  ?Problem Relation Age of Onset  ? Hypertension Mother   ? Alcohol abuse Father   ? Diabetes Maternal Grandmother   ? Hypertension Maternal Grandmother   ? Cancer Paternal Grandfather   ?     skin  ? ? ?Social History  ? ?Socioeconomic History  ? Marital status: Single  ?  Spouse name: Not on file  ? Number of children: Not on file  ? Years of education: Not on file  ? Highest education level: Not on file  ?Occupational History  ? Not on file  ?Tobacco Use  ? Smoking status: Never  ? Smokeless tobacco: Never  ? Tobacco comments:  ?  Vape - Reduced Nicotine  ?Vaping Use  ? Vaping Use: Former  ? Substances: Nicotine  ?Substance and Sexual Activity  ? Alcohol use: No  ? Drug use: No  ? Sexual activity: Yes  ?  Partners: Male  ?  Birth control/protection: None  ?Other Topics Concern  ? Not on file  ?Social History Narrative  ? Not on file  ? ?Social Determinants of Health  ? ?Financial Resource Strain: Low Risk   ? Difficulty of Paying Living Expenses: Not very hard  ?Food Insecurity: No Food Insecurity  ? Worried About [employer] of  Food in the Last Year: Never true  ? Ran Out of Food in the Last Year: Never true  ?Transportation Needs: Unmet Transportation Needs  ? Lack of Transportation (Medical): Yes  ? Lack of Transportation (Non-Medical): No  ?Physical Activity: Not on file  ?Stress: Stress Concern Present  ? Feeling of Stress : Rather much  ?Social Connections: Not on file  ?Intimate Partner Violence: Not on file  ? ? ?Outpatient Medications Prior to Visit  ?Medication Sig Dispense Refill  ? buPROPion (WELLBUTRIN SR) 150 MG 12 hr tablet Take 1 tablet (150 mg total) by mouth daily. 90 tablet 3  ? Cholecalciferol (VITAMIN D) 50 MCG (2000 UT) CAPS Take 1 capsule by mouth daily.    ? etonogestrel-ethinyl estradiol (NUVARING) 0.12-0.015 MG/24HR vaginal ring Take a home pregnancy test prior to insertion ?Insert vaginally and leave in place for 3 consecutive weeks, then  remove for 1 week. (Patient not taking: Reported on 11/25/2021) 3 each 3  ? FLUoxetine (PROZAC) 20 MG tablet Take 1 tablet (20 mg total) by mouth daily. 90 tablet 3  ? vitamin B-12 (CYANOCOBALAMIN) 1000 MCG tablet Take by mouth daily.    ? ?No facility-administered medications prior to visit.  ? ? ?Allergies  ?Allergen Reactions  ? Hydrocodone Other (See Comments)  ?  Other Reaction: SEVERE HA & GI Upset  ? Oxycodone Other (See Comments)  ?  Other Reaction: SEVERE HA & GI Upset  ? Tape Rash  ? ? ?Review of Systems ? ?   ?Objective:  ?  ?Physical Exam ? ?There were no vitals taken for this visit. ?Wt Readings from Last 3 Encounters:  ?11/25/21 226 lb (102.5 kg)  ?06/06/21 253 lb 3.2 oz (114.9 kg)  ?03/01/21 271 lb 1.6 oz (123 kg)  ? ? ?Health Maintenance Due  ?Topic Date Due  ? COVID-19 Vaccine (1) Never done  ? HPV VACCINES (2 - 3-dose series) 05/06/2014  ? PAP-Cervical Cytology Screening  06/21/2021  ? PAP SMEAR-Modifier  06/21/2021  ? ? ?   ?Topic Date Due  ? HPV VACCINES (2 - 3-dose series) 05/06/2014  ? ? ? ?Lab Results  ?Component Value Date  ? TSH 0.883 08/25/2020  ? ?Lab Results  ?Component Value Date  ? WBC 10.6 (H) 03/03/2021  ? HGB 9.0 (L) 03/03/2021  ? HCT 29.3 (L) 03/03/2021  ? MCV 77.7 (L) 03/03/2021  ? PLT 255 03/03/2021  ? ?Lab Results  ?Component Value Date  ? NA 135 03/03/2021  ? K 4.0 03/03/2021  ? CO2 25 03/03/2021  ? GLUCOSE 71 03/03/2021  ? BUN <5 (L) 03/03/2021  ? CREATININE 0.59 03/03/2021  ? BILITOT 0.4 03/03/2021  ? ALKPHOS 81 03/03/2021  ? AST 18 03/03/2021  ? ALT 10 03/03/2021  ? PROT 5.3 (L) 03/03/2021  ? ALBUMIN 2.3 (L) 03/03/2021  ? CALCIUM 8.4 (L) 03/03/2021  ? ANIONGAP 4 (L) 03/03/2021  ? ?No results found for: CHOL ?No results found for: HDL ?No results found for: LDLCALC ?No results found for: TRIG ?No results found for: CHOLHDL ?Lab Results  ?Component Value Date  ? HGBA1C 5.6 02/23/2021  ? ? ?   ?Assessment & Plan:  ? ?Problem List Items Addressed This Visit   ?None ? ? ? ?No  orders of the defined types were placed in this encounter. ? ? ? ?Sarina Ill, CMA ? ?

## 2021-12-26 ENCOUNTER — Ambulatory Visit: Payer: 59 | Admitting: Physician Assistant

## 2021-12-29 ENCOUNTER — Encounter: Payer: Self-pay | Admitting: Family Medicine

## 2022-01-03 ENCOUNTER — Telehealth (INDEPENDENT_AMBULATORY_CARE_PROVIDER_SITE_OTHER): Payer: 59 | Admitting: Family Medicine

## 2022-01-03 ENCOUNTER — Encounter: Payer: Self-pay | Admitting: Family Medicine

## 2022-01-03 ENCOUNTER — Other Ambulatory Visit: Payer: Self-pay

## 2022-01-03 VITALS — Wt 215.0 lb

## 2022-01-03 DIAGNOSIS — F419 Anxiety disorder, unspecified: Secondary | ICD-10-CM

## 2022-01-03 DIAGNOSIS — R5381 Other malaise: Secondary | ICD-10-CM | POA: Diagnosis not present

## 2022-01-03 DIAGNOSIS — E6609 Other obesity due to excess calories: Secondary | ICD-10-CM | POA: Insufficient documentation

## 2022-01-03 DIAGNOSIS — F32A Depression, unspecified: Secondary | ICD-10-CM

## 2022-01-03 DIAGNOSIS — R5383 Other fatigue: Secondary | ICD-10-CM | POA: Insufficient documentation

## 2022-01-03 DIAGNOSIS — Z6834 Body mass index (BMI) 34.0-34.9, adult: Secondary | ICD-10-CM

## 2022-01-03 NOTE — Assessment & Plan Note (Signed)
Body mass index is 34.18 kg/m?Marland Kitchen ?Reassurance provided, has successfully lost weight since last appt with use of increased protein, more vegetables. ?Encourage water to assist and more movement ?Discussed importance of healthy weight management ?Discussed diet and exercise ? ? ?

## 2022-01-03 NOTE — Assessment & Plan Note (Signed)
Chronic, improved with use of 150 mg Wellbutrin and Prozac 20 mg ?Reports improvement in relationship between husband and with kids ?Continue to recommend assistance with use of counseling and dedicated self-care time ?

## 2022-01-03 NOTE — Progress Notes (Signed)
? ?[employer] as a Neurosurgeon for Jacky Kindle, FNP.,have documented all relevant documentation on the behalf of Jacky Kindle, FNP,as directed by  Jacky Kindle, FNP while in the presence of Jacky Kindle, FNP.  ? ?MyChart Video Visit ? ? ? ?Virtual Visit via Video Note  ? ?This visit type was conducted due to national recommendations for restrictions regarding the COVID-19 Pandemic (e.g. social distancing) in an effort to limit this patient's exposure and mitigate transmission in our community. This patient is at least at moderate risk for complications without adequate follow up. This format is felt to be most appropriate for this patient at this time. Physical exam was limited by quality of the video and audio technology used for the visit.  ? ?Patient location: home ?Provider location:  ?[facility] ?[address]  ?[address] ?[place], [place] [address] ? ? ?I discussed the limitations of evaluation and management by telemedicine and the availability of in person appointments. The patient expressed understanding and agreed to proceed. ? ?Patient: Kimberly Farmer   DOB: 1996-01-30   25 y.o. Female  MRN: [id number] ?Visit Date: 01/03/2022 ? ?Today's healthcare provider: Jacky Kindle, FNP  ?Re Introduced to nurse practitioner role and practice setting.  All questions answered.  Discussed provider/patient relationship and expectations. ? ?Chief Complaint  ?Patient presents with  ? Obesity  ? ?Subjective  ?  ?HPI  ?Follow up for Morbid Obesity ? ?The patient was last seen for this 1 months ago. ?Changes made at last visit include Discussed importance of healthy weight management ?Discussed diet and exercise. ? ?She reports excellent compliance with treatment. ?She feels that condition is Improved. ?She is not having side effects.  ? ?-----------------------------------------------------------------------------------------  ? ? ?Medications: ?Outpatient Medications Prior to Visit   ?Medication Sig  ? buPROPion (WELLBUTRIN SR) 150 MG 12 hr tablet Take 1 tablet (150 mg total) by mouth daily.  ? Cholecalciferol (VITAMIN D) 50 MCG (2000 UT) CAPS Take 1 capsule by mouth daily.  ? FLUoxetine (PROZAC) 20 MG tablet Take 1 tablet (20 mg total) by mouth daily.  ? vitamin B-12 (CYANOCOBALAMIN) 1000 MCG tablet Take by mouth daily.  ? [DISCONTINUED] etonogestrel-ethinyl estradiol (NUVARING) 0.12-0.015 MG/24HR vaginal ring Take a home pregnancy test prior to insertion ?Insert vaginally and leave in place for 3 consecutive weeks, then remove for 1 week. (Patient not taking: Reported on 11/25/2021)  ? ?No facility-administered medications prior to visit.  ? ? ?Review of Systems ? ? ? Objective  ?  ?Wt 215 lb (97.5 kg) Comment: stated  BMI 34.18 kg/m?  ? ? ?Physical Exam ?Constitutional:   ?   Appearance: Normal appearance.  ?Pulmonary:  ?   Effort: Pulmonary effort is normal.  ?Neurological:  ?   General: No focal deficit present.  ?   Mental Status: She is alert.  ?Psychiatric:     ?   Mood and Affect: Mood normal.     ?   Behavior: Behavior normal.     ?   Thought Content: Thought content normal.     ?   Judgment: Judgment normal.  ?  ? ? ? Assessment & Plan  ?  ? ?Problem List Items Addressed This Visit   ? ?  ? Other  ? Anxiety and depression - Primary  ?  Chronic, improved with use of 150 mg Wellbutrin and Prozac 20 mg ?Reports improvement in relationship between husband and with kids ?Continue to recommend assistance with use of  counseling and dedicated self-care time ?  ?  ? Class 1 obesity due to excess calories without serious comorbidity with body mass index (BMI) of 34.0 to 34.9 in adult  ?  Body mass index is 34.18 kg/m?Marland Kitchen ?Reassurance provided, has successfully lost weight since last appt with use of increased protein, more vegetables. ?Encourage water to assist and more movement ?Discussed importance of healthy weight management ?Discussed diet and exercise ? ? ?  ?  ? Malaise and fatigue  ?   New concern, encourage lab testing and in person visit for hands on assessment ?Will place labs to assist with diagnosis ?OK to seek labs off site ?Hx of morbid obesity with PCOS, has successfully lost weight since stopping breast feeding and use of Wellbutrin 150 SR QD ?  ?  ? Relevant Orders  ? Comprehensive Metabolic Panel (CMET)  ? CBC with Differential/Platelet  ? TSH + free T4  ? Hemoglobin A1c  ? Vitamin D (25 hydroxy)  ? B12 and Folate Panel  ? ? ? ?Return in about 3 months (around 04/05/2022) for annual examination.  ?  ? ?I discussed the assessment and treatment plan with the patient. The patient was provided an opportunity to ask questions and all were answered. The patient agreed with the plan and demonstrated an understanding of the instructions. ?  ?The patient was advised to call back or seek an in-person evaluation if the symptoms worsen or if the condition fails to improve as anticipated. ? ?I provided 20 minutes of face-to-face time during this encounter discussing and reviewing anxiety and depression and weight concerns and generalized fatigue/malaise. ? ?I, Jacky Kindle, FNP, have reviewed all documentation for this visit. The documentation on 01/03/22 for the exam, diagnosis, procedures, and orders are all accurate and complete. ? ?Jacky Kindle, FNP ?[facility] ?[phone] (phone) ?[phone] (fax) ? ?[facility]  ?

## 2022-01-03 NOTE — Assessment & Plan Note (Signed)
New concern, encourage lab testing and in person visit for hands on assessment ?Will place labs to assist with diagnosis ?OK to seek labs off site ?Hx of morbid obesity with PCOS, has successfully lost weight since stopping breast feeding and use of Wellbutrin 150 SR QD ?

## 2022-01-10 ENCOUNTER — Ambulatory Visit (INDEPENDENT_AMBULATORY_CARE_PROVIDER_SITE_OTHER): Payer: Medicaid Other | Admitting: Licensed Clinical Social Worker

## 2022-01-10 ENCOUNTER — Other Ambulatory Visit: Payer: Self-pay

## 2022-01-10 DIAGNOSIS — F4323 Adjustment disorder with mixed anxiety and depressed mood: Secondary | ICD-10-CM | POA: Diagnosis not present

## 2022-01-10 NOTE — Progress Notes (Signed)
Virtual Visit via Video Note ? ?I connected with Kimberly Farmer on 01/10/22 at  2:00 PM EDT by a video enabled telemedicine application and verified that I am speaking with the correct person using two identifiers. ? ?Location: ?Patient: home ?Provider: remote office [place], [place]) ?  ?I discussed the limitations of evaluation and management by telemedicine and the availability of in person appointments. The patient expressed understanding and agreed to proceed. ?  ?I discussed the assessment and treatment plan with the patient. The patient was provided an opportunity to ask questions and all were answered. The patient agreed with the plan and demonstrated an understanding of the instructions. ?  ?The patient was advised to call back or seek an in-person evaluation if the symptoms worsen or if the condition fails to improve as anticipated. ? ?I provided 30 minutes of non-face-to-face time during this encounter. ? ? ?Sven Pinheiro R Andria Head, LCSW ? ?THERAPIST PROGRESS NOTE ? ?Session Time: 2-230p ? ?Participation Level: Minimal ? ?Behavioral Response: NeatAlertAnxious ? ?Type of Therapy: Individual Therapy ? ?Treatment Goals addressed: Problem: Depression CCP Problem  1 Decrease depressive symptoms and improve levels of effective functioning  ?Goal: LTG: Reduce frequency, intensity, and duration of depression symptoms as evidenced by: SSB input needed on appropriate metric ?Outcome: Progressing ? ?Goal: STG: Yaslin WILL PARTICIPATE IN AT LEAST 80% OF SCHEDULED INDIVIDUAL PSYCHOTHERAPY SESSIONS ?Outcome: Progressing ?  ?Problem: Anxiety Disorder CCP Problem  1 Reduce overall frequency, intensity, and duration of the anxiety so that daily functioning is not impaired. ?Goal: LTG: Patient will score less than 5 on the Generalized Anxiety Disorder 7 Scale (GAD-7) ?Outcome: Progressing ? ?Goal: STG: Patient will participate in at least 80% of scheduled individual psychotherapy sessions ?Outcome:  Progressing ? ?ProgressTowards Goals: Progressing ? ?Interventions: CBT and Solution Focused ?Intervention: Encourage self-care activities ? ?Intervention: Encourage family support ? ?Intervention: Encourage compliance with prescribed medication regimen ? ?Intervention: Encourage patient to identify triggers ? ?Intervention: Monitor coping skills and behavior ? ?Intervention: Assist with relaxation techniques, as appropriate (deep breathing exercises, meditation, guided imagery) ? ?Summary: Kimberly Farmer is a 26 y.o. female who presents with symptoms consistent with adjustment disorder/anxiety/depression. Pt reports that PCP recently started on bupropion and fluoxetine. Pt reports that overall mood has been stable and that she is still experiencing some stress/anxiety. ? ?Allowed pt to explore and express thoughts and feelings associated with recent life situations and external stressors. Reviewed ways of managing parenting stress, seeking ways of balancing life better, focusing on finding time for self care, and reaching out for external supports when needed. Pt reports that she and husband are in the process of selling their home and moving into a camper with their 3 kids. Pt states that they have made that decision to save money so that they can travel more as a family.  ? ?Continued recommendations are as follows: self care behaviors, positive social engagements, focusing on overall work/home/life balance, and focusing on positive physical and emotional wellness.  ? ?Suicidal/Homicidal: No ? ?Therapist Response: Pt is continuing to apply interventions learned in session into daily life situations. Pt is currently on track to meet goals utilizing interventions mentioned above. Personal growth and progress noted. Treatment to continue as indicated.  ? ? ?Plan: Return again in 4 weeks. ? ?Diagnosis: Adjustment disorder with mixed anxiety and depressed mood ? ?Collaboration of Care: Other pt receiving medication  management from PCP ? ?Patient/Guardian was advised Release of Information must be obtained prior to any record release in order  to collaborate their care with an outside provider. Patient/Guardian was advised if they have not already done so to contact the registration department to sign all necessary forms in order for [place] to release information regarding their care.  ? ?Consent: Patient/Guardian gives verbal consent for treatment and assignment of benefits for services provided during this telehealth visit. Patient/Guardian expressed understanding and agreed to proceed.  ? ?Keymani Glynn R Krishan Mcbreen, LCSW ?01/10/2022 ? ?

## 2022-01-10 NOTE — Plan of Care (Signed)
?  Problem: Depression CCP Problem  1 Decrease depressive symptoms and improve levels of effective functioning  ?Goal: LTG: Reduce frequency, intensity, and duration of depression symptoms as evidenced by: SSB input needed on appropriate metric ?Outcome: Progressing ?Goal: STG: Leandrea WILL PARTICIPATE IN AT LEAST 80% OF SCHEDULED INDIVIDUAL PSYCHOTHERAPY SESSIONS ?Outcome: Progressing ?  ?Problem: Anxiety Disorder CCP Problem  1 Reduce overall frequency, intensity, and duration of the anxiety so that daily functioning is not impaired. ?Goal: LTG: Patient will score less than 5 on the Generalized Anxiety Disorder 7 Scale (GAD-7) ?Outcome: Progressing ?Goal: STG: Patient will participate in at least 80% of scheduled individual psychotherapy sessions ?Outcome: Progressing ?  ?

## 2022-01-20 ENCOUNTER — Encounter: Payer: Self-pay | Admitting: Family Medicine

## 2022-02-02 ENCOUNTER — Encounter: Payer: Self-pay | Admitting: Family Medicine

## 2022-02-06 ENCOUNTER — Ambulatory Visit: Payer: Self-pay | Admitting: *Deleted

## 2022-02-06 NOTE — Telephone Encounter (Signed)
I returned her call.  Friday evening her husband noticed her eyes being yellowish colored only on the sides.  She is concerned. ?

## 2022-02-06 NOTE — Telephone Encounter (Signed)
Reason for Disposition ? Eye pain present > 24 hours ?   Has yellowing of edges of her eyes noticed by her husband Friday evening.   No other symptoms ? ?Answer Assessment - Initial Assessment Questions ?1. ONSET: "When did the pain start?" (e.g., minutes, hours, days) ?    My husband noticed I have yellow coloring in my eyes at the sides.   Friday evening.  No burning and redness or dryness in eyes.    I've had bad bruising from low iron recently.   I get cut easily.   Both eyes ?2. TIMING: "Does the pain come and go, or has it been constant since it started?" (e.g., constant, intermittent, fleeting) ?    *No Answer* ?3. SEVERITY: "How bad is the pain?"   (Scale 1-10; mild, moderate or severe) ?  - MILD (1-3): doesn't interfere with normal activities  ?  - MODERATE (4-7): interferes with normal activities or awakens from sleep  ?  - SEVERE (8-10): excruciating pain and patient unable to do normal activities ?    *No Answer* ?4. LOCATION: "Where does it hurt?"  (e.g., eyelid, eye, cheekbone) ?    *No Answer* ?5. CAUSE: "What do you think is causing the pain?" ?    *No Answer* ?6. VISION: "Do you have blurred vision or changes in your vision?"  ?    *No Answer* ?7. EYE DISCHARGE: "Is there any discharge (pus) from the eye(s)?"  If yes, ask: "What color is it?"  ?    *No Answer* ?8. FEVER: "Do you have a fever?" If Yes, ask: "What is it, how was it measured, and when did it start?"  ?    *No Answer* ?9. OTHER SYMPTOMS: "Do you have any other symptoms?" (e.g., headache, nasal discharge, facial rash) ?    *No Answer* ?10. PREGNANCY: "Is there any chance you are pregnant?" "When was your last menstrual period?" ?      *No Answer* ? ?Protocols used: Eye Pain and Other Symptoms-A-AH ? ?Chief Complaint: yellow color in eyes ?Symptoms: No symptoms ?Frequency: Husband noticed it Friday evening ?Pertinent Negatives: Patient denies dryness, burning, discharge, or watering.   ?Disposition: [] ED /[] Urgent Care (no appt  availability in office) / [x] Appointment(In office/virtual)/ []  [facility]/ [] Home Care/ [] Refused Recommended Disposition /[] [facility]/ []  Follow-up with PCP ?Additional Notes: Already has appt on 02/08/2022 will address during that appt.  ?

## 2022-02-07 LAB — CBC WITH DIFFERENTIAL/PLATELET
Basophils Absolute: 0 10*3/uL (ref 0.0–0.2)
Basos: 0 %
EOS (ABSOLUTE): 0.1 10*3/uL (ref 0.0–0.4)
Eos: 1 %
Hematocrit: 37.7 % (ref 34.0–46.6)
Hemoglobin: 12.4 g/dL (ref 11.1–15.9)
Immature Grans (Abs): 0 10*3/uL (ref 0.0–0.1)
Immature Granulocytes: 0 %
Lymphocytes Absolute: 1.7 10*3/uL (ref 0.7–3.1)
Lymphs: 24 %
MCH: 26.3 pg — ABNORMAL LOW (ref 26.6–33.0)
MCHC: 32.9 g/dL (ref 31.5–35.7)
MCV: 80 fL (ref 79–97)
Monocytes Absolute: 0.5 10*3/uL (ref 0.1–0.9)
Monocytes: 7 %
Neutrophils Absolute: 4.6 10*3/uL (ref 1.4–7.0)
Neutrophils: 68 %
Platelets: 314 10*3/uL (ref 150–450)
RBC: 4.71 x10E6/uL (ref 3.77–5.28)
RDW: 13.7 % (ref 11.7–15.4)
WBC: 6.8 10*3/uL (ref 3.4–10.8)

## 2022-02-07 LAB — COMPREHENSIVE METABOLIC PANEL
ALT: 15 IU/L (ref 0–32)
AST: 17 IU/L (ref 0–40)
Albumin/Globulin Ratio: 2.3 — ABNORMAL HIGH (ref 1.2–2.2)
Albumin: 4.4 g/dL (ref 3.9–5.0)
Alkaline Phosphatase: 77 IU/L (ref 44–121)
BUN/Creatinine Ratio: 12 (ref 9–23)
BUN: 7 mg/dL (ref 6–20)
Bilirubin Total: <0.2 mg/dL (ref 0.0–1.2)
CO2: 25 mmol/L (ref 20–29)
Calcium: 9.6 mg/dL (ref 8.7–10.2)
Chloride: 103 mmol/L (ref 96–106)
Creatinine, Ser: 0.57 mg/dL (ref 0.57–1.00)
Globulin, Total: 1.9 g/dL (ref 1.5–4.5)
Glucose: 71 mg/dL (ref 70–99)
Potassium: 4.2 mmol/L (ref 3.5–5.2)
Sodium: 142 mmol/L (ref 134–144)
Total Protein: 6.3 g/dL (ref 6.0–8.5)
eGFR: 129 mL/min/{1.73_m2} (ref >59)

## 2022-02-07 LAB — TSH+FREE T4
Free T4: 1.3 ng/dL (ref 0.82–1.77)
TSH: 0.953 u[IU]/mL (ref 0.450–4.500)

## 2022-02-07 LAB — B12 AND FOLATE PANEL
Folate: 3.7 ng/mL (ref >3.0)
Vitamin B-12: 398 pg/mL (ref 232–1245)

## 2022-02-07 LAB — VITAMIN D 25 HYDROXY (VIT D DEFICIENCY, FRACTURES): Vit D, 25-Hydroxy: 33.6 ng/mL (ref 30.0–100.0)

## 2022-02-07 LAB — HEMOGLOBIN A1C
Est. average glucose Bld gHb Est-mCnc: 105 mg/dL
Hgb A1c MFr Bld: 5.3 % (ref 4.8–5.6)

## 2022-02-07 NOTE — Progress Notes (Signed)
? ? ? ?I,Sha'taria Tyson,acting as a Neurosurgeon for [employer], PA-C.,have documented all relevant documentation on the behalf of Alfredia Ferguson, PA-C,as directed by  Alfredia Ferguson, PA-C while in the presence of Alfredia Ferguson, PA-C.  ?Acute Office Visit ? ?Subjective:  ? ?  ?Patient ID: Kimberly Farmer, female    DOB: 12/12/1995, 26 y.o.   MRN: [id number] ? ?Cc. Multiple concerns ? ?HPI ?Yellow eyes ?-Husband noticed the edges of her eyes were yellow last week. Pt is not able to notice herself. ?-Denies abdominal pain but admits to some nausea intermittently.  ? ?Migraines ?--used to take topamax ?--pounding, light, sound, sees 'bubbles'  ?--lately 2 times a week, no aura, takes 600-800 mg ibuprofen with no relief. 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 ?Left wrist pain  ?-weakness, pain, numbness, history of carpal tunnel. Denies injury ? ?Right ankle ?--pain, goes to sleep easy, especially positionally, if sitting with legs straight. Reports history of DDD dx as a teen ? ?Lymph node right side of neck  ?-no changes, was told to follow up if didn't go away. Painless ? ?Easy bruising ?-mainly legs, denies injury for brusing ? ?   ?Objective:  ? Blood pressure 124/88, pulse 70, height 5\' 6"  (1.676 m), weight 215 lb 1.6 oz (97.6 kg), last menstrual period 01/23/2022, SpO2 100 %.  ? ?Physical Exam ?Constitutional:   ?   General: She is awake.  ?   Appearance: She is well-developed.  ?HENT:  ?   Head: Normocephalic.  ?Eyes:  ?   Conjunctiva/sclera: Conjunctivae normal.  ?Neck:  ?   Comments: Posterior R cervical adenopathy--singular node, < 1 cm, mobile, soft, shotty.  ? ?Cardiovascular:  ?   Rate and Rhythm: Normal rate and regular rhythm.  ?   Heart sounds: Normal heart sounds.  ?Pulmonary:  ?   Effort: Pulmonary effort is normal.  ?   Breath sounds: Normal breath sounds.  ?Abdominal:  ?   General: Abdomen is flat.  ?   Palpations: Abdomen is soft.  ?   Tenderness: There is no  abdominal tenderness.  ?Musculoskeletal:  ?   Right lower leg: No edema.  ?   Left lower leg: No edema.  ?Lymphadenopathy:  ?   Cervical: Cervical adenopathy present.  ?Skin: ?   General: Skin is warm.  ?   Findings: Bruising present.  ?Neurological:  ?   Mental Status: She is alert and oriented to person, place, and time.  ?Psychiatric:     ?   Attention and Perception: Attention normal.     ?   Mood and Affect: Mood normal.     ?   Speech: Speech normal.     ?   Behavior: Behavior is cooperative.  ? ? ?No results found for any visits on 02/08/22. ? ? ?   ?Assessment & Plan:  ? ?Problem List Items Addressed This Visit   ? ?  ? Cardiovascular and Mediastinum  ? Migraine with aura and without status migrainosus, not intractable - Primary  ?  rx sumtriptan, take 1 pill at onset of headache and can take again in 2 hours, do not take more than twice a week, and not more than twice in a 24 hour period ?D/t seizure history, ref to neuro  ? ?  ?  ? Relevant Medications  ? SUMAtriptan (IMITREX) 25 MG tablet  ? Other Relevant Orders  ? Ambulatory referral to Neurology  ?  ? Nervous and  Auditory  ? Left carpal tunnel syndrome  ?  Advised wrist brace, monitor for progression ? ?  ?  ?  ? Immune and Lymphatic  ? Reactive cervical lymphadenopathy  ?  R post cervical lymph node mildly enlarged, mobile, soft, advised pt to monitor but no concern as of today ? ?  ?  ?  ? Other  ? Easy bruising  ?  Recent cbc wnl, can check pt/ptt  ? ? ?  ?  ? Relevant Orders  ? PT and PTT  ? Chronic pain of right ankle  ?  D/t nature of history likely secondary to lumbar issues ?Refer back to ortho ? ?  ?  ? Relevant Orders  ? Ambulatory referral to Orthopedics  ? Yellow eyes  ?  Nothing of concern on exam ?Recent LFTs all wnl  ?Normal abdominal exam ? ?  ?  ?  ? ?Return in about 4 months (around 06/10/2022), or w/ pcp. ? ?I, Alfredia Ferguson, PA-C have reviewed all documentation for this visit. The documentation on  02/08/2022  for the exam,  diagnosis, procedures, and orders are all accurate and complete. ? ?Alfredia Ferguson, PA-C ?[facility] ?[address] ?[place], [place], [address] ?Office: [phone] ?Fax: [phone]  ? ? ?

## 2022-02-08 ENCOUNTER — Ambulatory Visit: Payer: Medicaid Other | Admitting: Physician Assistant

## 2022-02-08 ENCOUNTER — Encounter: Payer: Self-pay | Admitting: Physician Assistant

## 2022-02-08 VITALS — BP 124/88 | HR 70 | Ht 66.0 in | Wt 215.1 lb

## 2022-02-08 DIAGNOSIS — R233 Spontaneous ecchymoses: Secondary | ICD-10-CM

## 2022-02-08 DIAGNOSIS — R17 Unspecified jaundice: Secondary | ICD-10-CM

## 2022-02-08 DIAGNOSIS — G5602 Carpal tunnel syndrome, left upper limb: Secondary | ICD-10-CM | POA: Diagnosis not present

## 2022-02-08 DIAGNOSIS — M25571 Pain in right ankle and joints of right foot: Secondary | ICD-10-CM | POA: Diagnosis not present

## 2022-02-08 DIAGNOSIS — G8929 Other chronic pain: Secondary | ICD-10-CM

## 2022-02-08 DIAGNOSIS — G43109 Migraine with aura, not intractable, without status migrainosus: Secondary | ICD-10-CM | POA: Diagnosis not present

## 2022-02-08 DIAGNOSIS — R590 Localized enlarged lymph nodes: Secondary | ICD-10-CM

## 2022-02-08 HISTORY — DX: Unspecified jaundice: R17

## 2022-02-08 MED ORDER — SUMATRIPTAN SUCCINATE 25 MG PO TABS: ORAL_TABLET | ORAL | 0 refills | Status: DC

## 2022-02-08 NOTE — Assessment & Plan Note (Signed)
Nothing of concern on exam ?Recent LFTs all wnl  ?Normal abdominal exam ?

## 2022-02-08 NOTE — Assessment & Plan Note (Signed)
Recent cbc wnl, can check pt/ptt  ? ?

## 2022-02-08 NOTE — Assessment & Plan Note (Signed)
R post cervical lymph node mildly enlarged, mobile, soft, advised pt to monitor but no concern as of today ?

## 2022-02-08 NOTE — Assessment & Plan Note (Signed)
rx sumtriptan, take 1 pill at onset of headache and can take again in 2 hours, do not take more than twice a week, and not more than twice in a 24 hour period ?D/t seizure history, ref to neuro  ?

## 2022-02-08 NOTE — Assessment & Plan Note (Signed)
D/t nature of history likely secondary to lumbar issues ?Refer back to ortho ?

## 2022-02-08 NOTE — Assessment & Plan Note (Signed)
Advised wrist brace, monitor for progression ?

## 2022-02-15 ENCOUNTER — Other Ambulatory Visit: Payer: Self-pay | Admitting: Family Medicine

## 2022-02-15 ENCOUNTER — Encounter: Payer: Self-pay | Admitting: Family Medicine

## 2022-02-15 MED ORDER — SEMAGLUTIDE-WEIGHT MANAGEMENT 0.25 MG/0.5ML ~~LOC~~ SOAJ: 0.2500 mg | SUBCUTANEOUS | 0 refills | Status: DC

## 2022-02-16 ENCOUNTER — Telehealth: Payer: Self-pay

## 2022-02-16 NOTE — Telephone Encounter (Signed)
Patient called back to inquire about prior auth for medication below asking for a call back at Ph# [phone] ?

## 2022-02-16 NOTE — Telephone Encounter (Signed)
Copied from CRM [id number]. Topic: General - Other ?>> Feb 16, 2022 10:08 AM Pawlus, Maxine Glenn A wrote: ?Reason for CRM: Pt would like a call back to go over the PA needed for Semaglutide-Weight Management 0.25 MG/0.5ML SOAJ, please advise. ?>> Feb 16, 2022 10:09 AM Pawlus, Maxine Glenn A wrote: ?Pt also stated she had the pharmacy send over the form required for this Rx, please advise.  ?

## 2022-02-20 ENCOUNTER — Ambulatory Visit (INDEPENDENT_AMBULATORY_CARE_PROVIDER_SITE_OTHER): Payer: Medicaid Other | Admitting: Family Medicine

## 2022-02-20 ENCOUNTER — Other Ambulatory Visit (HOSPITAL_COMMUNITY)
Admission: RE | Admit: 2022-02-20 | Discharge: 2022-02-20 | Disposition: A | Discharge status: Home / Self Care | Payer: Medicaid Other | Source: Ambulatory Visit | Attending: Family Medicine | Admitting: Family Medicine

## 2022-02-20 ENCOUNTER — Encounter: Payer: Self-pay | Admitting: Family Medicine

## 2022-02-20 VITALS — BP 134/88 | HR 78 | Wt 211.0 lb

## 2022-02-20 DIAGNOSIS — Z30430 Encounter for insertion of intrauterine contraceptive device: Secondary | ICD-10-CM | POA: Diagnosis not present

## 2022-02-20 DIAGNOSIS — Z833 Family history of diabetes mellitus: Secondary | ICD-10-CM

## 2022-02-20 DIAGNOSIS — E282 Polycystic ovarian syndrome: Secondary | ICD-10-CM

## 2022-02-20 DIAGNOSIS — Z01419 Encounter for gynecological examination (general) (routine) without abnormal findings: Secondary | ICD-10-CM | POA: Diagnosis not present

## 2022-02-20 DIAGNOSIS — R7303 Prediabetes: Secondary | ICD-10-CM | POA: Diagnosis not present

## 2022-02-20 DIAGNOSIS — E669 Obesity, unspecified: Secondary | ICD-10-CM

## 2022-02-20 MED ORDER — OZEMPIC (0.25 OR 0.5 MG/DOSE) 2 MG/1.5ML ~~LOC~~ SOPN: PEN_INJECTOR | SUBCUTANEOUS | 3 refills | Status: DC

## 2022-02-20 MED ORDER — LEVONORGESTREL 20 MCG/DAY IU IUD
1.0000 | INTRAUTERINE_SYSTEM | Freq: Once | INTRAUTERINE | Status: AC
  Administered 2022-02-20: 1 via INTRAUTERINE

## 2022-02-20 NOTE — Patient Instructions (Addendum)
?  This will be the the schedule for your Ozempic ? ?Week 1 through week 4 : 0.25 mg once weekly. ?Week 5 through week 8: 0.5 mg once weekly. ? ?SEEN IN THE OFFICE ? ?Next we might... ? ?Week 9 through week 12: 1 mg once weekly. ?Week 13 through week 16: 1.7 mg once weekly. ?Week 17 and thereafter (maintenance dosage): 2.4 mg once weekly; if not tolerated, may temporarily decrease dosage to 1.7 mg once weekly for up to 4 additional weeks, then increase to 2.4 mg once weekly. ?

## 2022-02-20 NOTE — Progress Notes (Signed)
? ?GYNECOLOGY PROBLEM  VISIT ENCOUNTER NOTE ? ?Subjective:  ? Kimberly Farmer is a 26 y.o. [phone] female here for a problem GYN visit.  Current complaints: desires weight loss.  She is getting married in Sept and has lost 45lbs but had plateau despite 1500-1800 calorie diet. PCP prescribed a medication but it was not covered by insurance.  ? ?Denies abnormal vaginal bleeding, discharge, pelvic pain, problems with intercourse or other gynecologic concerns.  ?  ?Gynecologic History ?Patient's last menstrual period was 02/20/2022 (exact date). ? ?Contraception: none ? ?Health Maintenance Due  ?Topic Date Due  ? COVID-19 Vaccine (1) Never done  ? HPV VACCINES (2 - 3-dose series) 05/06/2014  ? PAP-Cervical Cytology Screening  06/21/2021  ? PAP SMEAR-Modifier  06/21/2021  ? ? ?The following portions of the patient's history were reviewed and updated as appropriate: allergies, current medications, past family history, past medical history, past social history, past surgical history and problem list. ? ?Review of Systems ?Pertinent items are noted in HPI. ?  ?Objective:  ?BP 134/88   Pulse 78   Wt 211 lb (95.7 kg)   LMP 02/20/2022 (Exact Date)   BMI 34.06 kg/m?  ? ?Wt Readings from Last 3 Encounters:  ?02/20/22 211 lb (95.7 kg)  ?02/08/22 215 lb 1.6 oz (97.6 kg)  ?01/03/22 215 lb (97.5 kg)  ? ? ?Gen: well appearing, NAD ?HEENT: no scleral icterus ?CV: RR ?Lung: Normal WOB ?Ext: warm well perfused ? ?PELVIC: Normal appearing external genitalia; normal appearing vaginal mucosa and cervix.  No abnormal discharge noted.  Pap smear obtained.  Normal uterine size, no other palpable masses, no uterine or adnexal tenderness. ? ?IUD Insertion Procedure Note ?Patient identified, informed consent performed, consent signed.   Discussed risks of irregular bleeding, cramping, infection, malpositioning or misplacement of the IUD outside the uterus which may require further procedure such as laparoscopy. Time out was performed.   Urine pregnancy test negative. ? ?Speculum placed in the vagina.  Cervix visualized.  Cleaned with Betadine x 2.  Grasped anteriorly with a single tooth tenaculum.  Uterus sounded to 8 cm.  IUD placed per manufacturer's recommendations.  Strings trimmed to 3 cm. Tenaculum was removed, good hemostasis noted.  Patient tolerated procedure well.  ? ? ?Assessment and Plan:  ?1. Well woman exam with routine gynecological exam ?- Cytology - PAP ? ?2. Encounter for IUD insertion ? ?Patient was given post-procedure instructions.  She was advised to have backup contraception for one week.  Patient was also asked to check IUD strings periodically and follow up in 4 weeks for IUD check. ? ?3. PCOS (polycystic ovarian syndrome) ?Patient has risk factors for T2DM and weight loss will help improve her overall health ?We reviewed the r/b of treatment in detail ?Patient accepts rx ?- Semaglutide,0.25 or 0.5MG /DOS, (OZEMPIC, 0.25 OR 0.5 MG/DOSE,) 2 MG/1.5ML SOPN; Week 1 through week 4 : 0.25 mg once weekly.  Week 5 through week 8: 0.5 mg once weekly.  Dispense: 1.5 mL; Refill: 3 ? ?4. Prediabetes ?- Semaglutide,0.25 or 0.5MG /DOS, (OZEMPIC, 0.25 OR 0.5 MG/DOSE,) 2 MG/1.5ML SOPN; Week 1 through week 4 : 0.25 mg once weekly.  Week 5 through week 8: 0.5 mg once weekly.  Dispense: 1.5 mL; Refill: 3 ? ?5. Family history of diabetes mellitus ?- Semaglutide,0.25 or 0.5MG /DOS, (OZEMPIC, 0.25 OR 0.5 MG/DOSE,) 2 MG/1.5ML SOPN; Week 1 through week 4 : 0.25 mg once weekly.  Week 5 through week 8: 0.5 mg once weekly.  Dispense: 1.5 mL; Refill:  3 ? ?6. Obesity (BMI 30-39.9) ?- Semaglutide,0.25 or 0.5MG /DOS, (OZEMPIC, 0.25 OR 0.5 MG/DOSE,) 2 MG/1.5ML SOPN; Week 1 through week 4 : 0.25 mg once weekly.  Week 5 through week 8: 0.5 mg once weekly.  Dispense: 1.5 mL; Refill: 3 ? ? ?Please refer to After Visit Summary for other counseling recommendations.  ? ?Return in about 6 weeks (around 04/03/2022) for IUD string check and weight check after starting  Ozempic. ? ?Caren Macadam, MD, MPH, ABFM ?Attending Physician ?[facility] ? ?

## 2022-02-20 NOTE — Progress Notes (Signed)
Would like to get ParaGard IUD today, started period today, and had sex with a condom a week ago ?

## 2022-02-23 LAB — CYTOLOGY - PAP: Diagnosis: NEGATIVE

## 2022-02-24 ENCOUNTER — Ambulatory Visit (INDEPENDENT_AMBULATORY_CARE_PROVIDER_SITE_OTHER): Payer: Medicaid Other | Admitting: Family Medicine

## 2022-02-24 ENCOUNTER — Encounter: Payer: Self-pay | Admitting: Family Medicine

## 2022-02-24 VITALS — BP 115/88 | HR 88 | Temp 97.5°F | Resp 16 | Wt 209.0 lb

## 2022-02-24 DIAGNOSIS — S20162A Insect bite (nonvenomous) of breast, left breast, initial encounter: Secondary | ICD-10-CM

## 2022-02-24 DIAGNOSIS — W57XXXD Bitten or stung by nonvenomous insect and other nonvenomous arthropods, subsequent encounter: Secondary | ICD-10-CM | POA: Diagnosis not present

## 2022-02-24 DIAGNOSIS — W57XXXA Bitten or stung by nonvenomous insect and other nonvenomous arthropods, initial encounter: Secondary | ICD-10-CM

## 2022-02-24 DIAGNOSIS — S20162D Insect bite (nonvenomous) of breast, left breast, subsequent encounter: Secondary | ICD-10-CM

## 2022-02-24 HISTORY — DX: Bitten or stung by nonvenomous insect and other nonvenomous arthropods, initial encounter: W57.XXXA

## 2022-02-24 HISTORY — DX: Insect bite (nonvenomous) of breast, left breast, initial encounter: S20.162A

## 2022-02-24 MED ORDER — DOXYCYCLINE HYCLATE 100 MG PO TABS
100.0000 mg | ORAL_TABLET | Freq: Two times a day (BID) | ORAL | 0 refills | Status: DC
Start: 2022-02-24 — End: 2022-07-13

## 2022-02-24 NOTE — Assessment & Plan Note (Signed)
Tick bite 2 weeks ago; rash developed this week ?6 cm in size, appearance similar to classic lyme dz bulls eye ?Will treat given appearance of rash ?Will grab labs as well for confirmation including AB and PCR- Lyme, Chem and CBC ?Patient has chronic joint aches/pains ?RTC in 2 wks if not improved  ?

## 2022-02-24 NOTE — Progress Notes (Signed)
?  ? ? ?Established patient visit ? ? ?Patient: Kimberly Farmer   DOB: [birth year]   25 y.o. Female  MRN: [id number] ?Visit Date: 02/24/2022 ? ?Today's healthcare provider: Jacky Kindle, FNP  ?Re Introduced to nurse practitioner role and practice setting.  All questions answered.  Discussed provider/patient relationship and expectations. ? ?[employer] as a Neurosurgeon for Jacky Kindle, FNP.,have documented all relevant documentation on the behalf of Jacky Kindle, FNP,as directed by  Jacky Kindle, FNP while in the presence of Jacky Kindle, FNP.  ?Chief Complaint  ?Patient presents with  ? Insect Bite  ?  Patient comes in office today with concerns of a tick bite that occurred two weeks ago. Patient states that tick was found on her left breast, she states that she used a lighter and tweezer to remove tick from breast. Patient states that it appears to be a bulls eye now around bite.  ? ?Subjective  ?  ?HPI ?HPI   ? ? Insect Bite   ? Additional comments: Patient comes in office today with concerns of a tick bite that occurred two weeks ago. Patient states that tick was found on her left breast, she states that she used a lighter and tweezer to remove tick from breast. Patient states that it appears to be a bulls eye now around bite. ? ?  ?  ?Last edited by Fonda Kinder, CMA on 02/24/2022 10:59 AM.  ?  ?  ? ?Medications: ?Outpatient Medications Prior to Visit  ?Medication Sig  ? buPROPion (WELLBUTRIN SR) 150 MG 12 hr tablet Take 1 tablet (150 mg total) by mouth daily.  ? Cholecalciferol (VITAMIN D) 50 MCG (2000 UT) CAPS Take 1 capsule by mouth daily.  ? FLUoxetine (PROZAC) 20 MG tablet Take 1 tablet (20 mg total) by mouth daily.  ? Semaglutide,0.25 or 0.5MG /DOS, (OZEMPIC, 0.25 OR 0.5 MG/DOSE,) 2 MG/1.5ML SOPN Week 1 through week 4 : 0.25 mg once weekly.  Week 5 through week 8: 0.5 mg once weekly.  ? Semaglutide-Weight Management 0.25 MG/0.5ML SOAJ Inject 0.25 mg into the skin once a week.  ?  SUMAtriptan (IMITREX) 25 MG tablet May repeat one tab in 2 hours if headache persists or recurs.  ? vitamin B-12 (CYANOCOBALAMIN) 1000 MCG tablet Take by mouth daily.  ? ?No facility-administered medications prior to visit.  ? ? ?Review of Systems ? ? ?  Objective  ?  ?BP 115/88   Pulse 88   Temp (!) 97.5 ?F (36.4 ?C) (Oral)   Resp 16   Wt 209 lb (94.8 kg)   LMP 02/20/2022 (Exact Date)   SpO2 99%   BMI 33.73 kg/m?  ? ? ?Physical Exam ?Vitals and nursing note reviewed.  ?Constitutional:   ?   General: She is not in acute distress. ?   Appearance: Normal appearance. She is obese. She is not ill-appearing, toxic-appearing or diaphoretic.  ?HENT:  ?   Head: Normocephalic and atraumatic.  ?Cardiovascular:  ?   Rate and Rhythm: Normal rate and regular rhythm.  ?   Pulses: Normal pulses.  ?   Heart sounds: Normal heart sounds. No murmur heard. ?  No friction rub. No gallop.  ?Pulmonary:  ?   Effort: Pulmonary effort is normal. No respiratory distress.  ?   Breath sounds: Normal breath sounds. No stridor. No wheezing, rhonchi or rales.  ?Chest:  ?   Chest wall: No tenderness.  ? ? ?Musculoskeletal:     ?  General: No swelling, tenderness, deformity or signs of injury. Normal range of motion.  ?   Right lower leg: No edema.  ?   Left lower leg: No edema.  ?Skin: ?   General: Skin is warm and dry.  ?   Capillary Refill: Capillary refill takes less than 2 seconds.  ?   Coloration: Skin is not jaundiced or pale.  ?   Findings: Rash present. No bruising, erythema or lesion.  ?Neurological:  ?   General: No focal deficit present.  ?   Mental Status: She is alert and oriented to person, place, and time. Mental status is at baseline.  ?   Cranial Nerves: No cranial nerve deficit.  ?   Sensory: No sensory deficit.  ?   Motor: No weakness.  ?   Coordination: Coordination normal.  ?Psychiatric:     ?   Mood and Affect: Mood normal.     ?   Behavior: Behavior normal.     ?   Thought Content: Thought content normal.     ?    Judgment: Judgment normal.  ?  ? ?No results found for any visits on 02/24/22. ? Assessment & Plan  ?  ? ?Problem List Items Addressed This Visit   ? ?  ? Musculoskeletal and Integument  ? Tick bite of left breast - Primary  ?  Tick bite 2 weeks ago; rash developed this week ?6 cm in size, appearance similar to classic lyme dz bulls eye ?Will treat given appearance of rash ?Will grab labs as well for confirmation including AB and PCR- Lyme, Chem and CBC ?Patient has chronic joint aches/pains ?RTC in 2 wks if not improved  ? ?  ?  ? Relevant Medications  ? doxycycline (VIBRA-TABS) 100 MG tablet  ? Other Relevant Orders  ? Comprehensive Metabolic Panel (CMET)  ? Lyme Disease Serology w/Reflex  ? Lyme disease dna by pcr(borrelia burg)  ? CBC with Differential/Platelet  ? ? ? ?Return if symptoms worsen or fail to improve.  ?   ? ?I, Jacky Kindle, FNP, have reviewed all documentation for this visit. The documentation on 02/24/22 for the exam, diagnosis, procedures, and orders are all accurate and complete. ? ? ? ?Jacky Kindle, FNP  ?[facility] ?[phone] (phone) ?[phone] (fax) ? ?[facility] ?

## 2022-03-01 LAB — COMPREHENSIVE METABOLIC PANEL
ALT: 13 IU/L (ref 0–32)
AST: 15 IU/L (ref 0–40)
Albumin/Globulin Ratio: 1.9 (ref 1.2–2.2)
Albumin: 5 g/dL (ref 3.9–5.0)
Alkaline Phosphatase: 80 IU/L (ref 44–121)
BUN/Creatinine Ratio: 7 — ABNORMAL LOW (ref 9–23)
BUN: 5 mg/dL — ABNORMAL LOW (ref 6–20)
Bilirubin Total: 0.2 mg/dL (ref 0.0–1.2)
CO2: 25 mmol/L (ref 20–29)
Calcium: 9.9 mg/dL (ref 8.7–10.2)
Chloride: 102 mmol/L (ref 96–106)
Creatinine, Ser: 0.74 mg/dL (ref 0.57–1.00)
Globulin, Total: 2.7 g/dL (ref 1.5–4.5)
Glucose: 59 mg/dL — ABNORMAL LOW (ref 70–99)
Potassium: 4.2 mmol/L (ref 3.5–5.2)
Sodium: 143 mmol/L (ref 134–144)
Total Protein: 7.7 g/dL (ref 6.0–8.5)
eGFR: 115 mL/min/{1.73_m2} (ref >59)

## 2022-03-01 LAB — CBC WITH DIFFERENTIAL/PLATELET
Basophils Absolute: 0 10*3/uL (ref 0.0–0.2)
Basos: 0 %
EOS (ABSOLUTE): 0.1 10*3/uL (ref 0.0–0.4)
Eos: 1 %
Hematocrit: 40.8 % (ref 34.0–46.6)
Hemoglobin: 12.8 g/dL (ref 11.1–15.9)
Immature Grans (Abs): 0 10*3/uL (ref 0.0–0.1)
Immature Granulocytes: 0 %
Lymphocytes Absolute: 2.7 10*3/uL (ref 0.7–3.1)
Lymphs: 31 %
MCH: 25.3 pg — ABNORMAL LOW (ref 26.6–33.0)
MCHC: 31.4 g/dL — ABNORMAL LOW (ref 31.5–35.7)
MCV: 81 fL (ref 79–97)
Monocytes Absolute: 0.6 10*3/uL (ref 0.1–0.9)
Monocytes: 7 %
Neutrophils Absolute: 5.3 10*3/uL (ref 1.4–7.0)
Neutrophils: 61 %
Platelets: 422 10*3/uL (ref 150–450)
RBC: 5.05 x10E6/uL (ref 3.77–5.28)
RDW: 13.6 % (ref 11.7–15.4)
WBC: 8.7 10*3/uL (ref 3.4–10.8)

## 2022-03-01 LAB — LYME DISEASE DNA BY PCR(BORRELIA BURG): Lyme (B. burgdorferi) PCR: NEGATIVE

## 2022-03-01 LAB — LYME DISEASE SEROLOGY W/REFLEX: Lyme Total Antibody EIA: NEGATIVE

## 2022-03-02 ENCOUNTER — Encounter: Payer: Self-pay | Admitting: Family Medicine

## 2022-03-02 NOTE — Telephone Encounter (Signed)
I recommend repeat in office/virtual appt. Was treated with Abx.

## 2022-03-02 NOTE — Telephone Encounter (Signed)
I called and advised patient. She agrees to schedule appointment. She plans to call back to schedule an appointment after arranging childcare for her children.

## 2022-04-03 ENCOUNTER — Ambulatory Visit: Payer: Medicaid Other | Admitting: Family Medicine

## 2022-04-05 ENCOUNTER — Encounter: Payer: Self-pay | Admitting: *Deleted

## 2022-04-10 ENCOUNTER — Encounter: Payer: Self-pay | Admitting: Family Medicine

## 2022-05-15 ENCOUNTER — Encounter: Payer: Self-pay | Admitting: Family Medicine

## 2022-05-16 ENCOUNTER — Other Ambulatory Visit: Payer: Self-pay | Admitting: Family Medicine

## 2022-05-16 MED ORDER — PHENTERMINE HCL 37.5 MG PO CAPS: 37.5000 mg | ORAL_CAPSULE | ORAL | 2 refills | Status: DC

## 2022-06-05 ENCOUNTER — Ambulatory Visit: Payer: Medicaid Other | Admitting: Family Medicine

## 2022-06-14 ENCOUNTER — Encounter: Payer: Self-pay | Admitting: Family Medicine

## 2022-06-14 ENCOUNTER — Telehealth: Payer: Self-pay

## 2022-06-14 ENCOUNTER — Other Ambulatory Visit: Payer: Self-pay | Admitting: Family Medicine

## 2022-06-14 DIAGNOSIS — Z3490 Encounter for supervision of normal pregnancy, unspecified, unspecified trimester: Secondary | ICD-10-CM

## 2022-06-14 DIAGNOSIS — Z3201 Encounter for pregnancy test, result positive: Secondary | ICD-10-CM

## 2022-06-14 NOTE — Telephone Encounter (Signed)
Returning pt call from vm left on 06/13/22.

## 2022-06-17 ENCOUNTER — Other Ambulatory Visit: Payer: Self-pay | Admitting: Family Medicine

## 2022-06-17 DIAGNOSIS — Z3201 Encounter for pregnancy test, result positive: Secondary | ICD-10-CM

## 2022-06-17 DIAGNOSIS — T8331XA Breakdown (mechanical) of intrauterine contraceptive device, initial encounter: Secondary | ICD-10-CM | POA: Insufficient documentation

## 2022-06-17 DIAGNOSIS — Z331 Pregnant state, incidental: Secondary | ICD-10-CM | POA: Insufficient documentation

## 2022-06-17 LAB — HCG, SERUM, QUALITATIVE

## 2022-06-17 LAB — BETA HCG QUANT (REF LAB): hCG Quant: 349 m[IU]/mL

## 2022-06-17 NOTE — Progress Notes (Signed)
hCG shows elevation consistent with 4-[redacted] weeks gestation; plan to complete TV US and follow up with OB.  Jacky Kindle, FNP  [facility] [address] [place], [place] [address] [phone] (phone) [phone] (fax) [facility]

## 2022-06-20 ENCOUNTER — Ambulatory Visit
Admission: RE | Admit: 2022-06-20 | Discharge: 2022-06-20 | Disposition: A | Discharge status: Home / Self Care | Payer: Medicaid Other | Source: Ambulatory Visit | Attending: Family Medicine | Admitting: Family Medicine

## 2022-06-20 ENCOUNTER — Other Ambulatory Visit: Payer: Self-pay

## 2022-06-20 ENCOUNTER — Telehealth: Payer: Self-pay | Admitting: *Deleted

## 2022-06-20 DIAGNOSIS — T8331XA Breakdown (mechanical) of intrauterine contraceptive device, initial encounter: Secondary | ICD-10-CM | POA: Insufficient documentation

## 2022-06-20 DIAGNOSIS — Z3201 Encounter for pregnancy test, result positive: Secondary | ICD-10-CM

## 2022-06-20 DIAGNOSIS — Z331 Pregnant state, incidental: Secondary | ICD-10-CM | POA: Diagnosis present

## 2022-06-20 NOTE — Telephone Encounter (Signed)
Spoke with patient. She already had an ultrasound, she just asked that I send her positive blood test to her OBGYN at [facility] so she can get an appt with them scheduled. Result has been faxed.

## 2022-06-20 NOTE — Telephone Encounter (Signed)
Results have been send to [facility] in [place].

## 2022-06-20 NOTE — Telephone Encounter (Signed)
  Chief Complaint: Results Symptoms: na Frequency: na Pertinent Negatives: Patient denies na Disposition: [] ED /[] Urgent Care (no appt availability in office) / [] Appointment(In office/virtual)/ []  [place] [facility]/ [] Home Care/ [] Refused Recommended Disposition /[] [place] [facility]/ [x]  Follow-up with PCP Additional Notes: ' ' with [facility] calling. results are available in Epic. Questioning if we received the addendum from radiologist:   "Consider close OB follow-up for above findings."  Called practice, , to alert of results. Also in Epic.

## 2022-06-21 ENCOUNTER — Telehealth: Payer: Self-pay | Admitting: *Deleted

## 2022-06-21 NOTE — Telephone Encounter (Signed)
Spoke with patient. Labs are ordered.

## 2022-06-21 NOTE — Telephone Encounter (Signed)
Patient is calling back- she is concerned about her pregnancy and she will not be seen at OB/GYN for a few weeks. Patient wants to know if she can have another Beta hCG test to make sure her numbers are going up appropriately. Patient advised would send request to provider for review.

## 2022-06-21 NOTE — Progress Notes (Signed)
Normal intra uterine pregnancy; no IUD seen on [place], although it may have been hard to visualize given character of [place]. Continue to follow up with OB.  Jacky Kindle, FNP  [facility] [address] [place], [place] [address] [phone] (phone) [phone] (fax) [facility]

## 2022-06-24 LAB — BETA HCG QUANT (REF LAB): hCG Quant: 4946 m[IU]/mL

## 2022-06-25 NOTE — Progress Notes (Signed)
Serial elevation of HCG noted; consistent with pregnancy.  Jacky Kindle, FNP  [facility] [address] [place], [place] [address] [phone] (phone) [phone] (fax) [facility]

## 2022-07-11 ENCOUNTER — Encounter: Payer: Self-pay | Admitting: Family Medicine

## 2022-07-13 ENCOUNTER — Encounter (HOSPITAL_COMMUNITY): Payer: Self-pay | Admitting: Obstetrics and Gynecology

## 2022-07-13 ENCOUNTER — Inpatient Hospital Stay (HOSPITAL_COMMUNITY)
Admission: AD | Admit: 2022-07-13 | Discharge: 2022-07-13 | Disposition: A | Discharge status: Home / Self Care | Payer: Medicaid Other | Attending: Obstetrics and Gynecology | Admitting: Obstetrics and Gynecology

## 2022-07-13 DIAGNOSIS — Z3A08 8 weeks gestation of pregnancy: Secondary | ICD-10-CM

## 2022-07-13 DIAGNOSIS — O209 Hemorrhage in early pregnancy, unspecified: Secondary | ICD-10-CM

## 2022-07-13 DIAGNOSIS — O4691 Antepartum hemorrhage, unspecified, first trimester: Secondary | ICD-10-CM | POA: Insufficient documentation

## 2022-07-13 DIAGNOSIS — Z79899 Other long term (current) drug therapy: Secondary | ICD-10-CM | POA: Diagnosis not present

## 2022-07-13 LAB — URINALYSIS, ROUTINE W REFLEX MICROSCOPIC
Bilirubin Urine: NEGATIVE
Glucose, UA: NEGATIVE mg/dL
Ketones, ur: NEGATIVE mg/dL
Nitrite: NEGATIVE
Protein, ur: NEGATIVE mg/dL
Specific Gravity, Urine: 1.011 (ref 1.005–1.030)
pH: 7 (ref 5.0–8.0)

## 2022-07-13 NOTE — MAU Provider Note (Signed)
History     [id number]  Arrival date and time: 07/13/22 1828    Chief Complaint  Patient presents with   Vaginal Bleeding     HPI Kimberly Farmer is a 26 y.o. at Unknown with PMHx notable for CS x2, anxiety, who presents for vaginal bleeding.   Prior to triaging being completed patient reported she needed to leave urgently due to child care issues I subsequently saw patient immediately She reported vaginal bleeding since earlier this week but it picked up today which prompted her visit No abdominal pain      OB History     Gravida  5   Para  3   Term  3   Preterm  0   AB  1   Living  2      SAB  1   IAB  0   Ectopic  0   Multiple  0   Live Births  2           Past Medical History:  Diagnosis Date   Arthritis    Bilateral knees and back   Breakthrough bleeding on birth control pills 05/16/2018   Cesarean wound infection 03/18/2019   Choroid plexus cysts, fetal, affecting care of mother, antepartum 09/17/2018   Gestational hypertension 03/01/2021   Lumbar disc disease    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   PCOS (polycystic ovarian syndrome)    Post-dates pregnancy 02/21/2019   Pregnancy induced hypertension     Past Surgical History:  Procedure Laterality Date   CESAREAN SECTION N/A 02/21/2019   Procedure: CESAREAN SECTION;  Surgeon: Gae Dry, MD;  Location: [facility] ORS;  Service: Obstetrics;  Laterality: N/A;   CESAREAN SECTION N/A 03/02/2021   Procedure: CESAREAN SECTION;  Surgeon: Osborne Oman, MD;  Location: [facility] LD ORS;  Service: Obstetrics;  Laterality: N/A;   KNEE SURGERY Left 2011   Kimberly Farmer has had 5 surgeries on her knee   TONSILLECTOMY AND ADENOIDECTOMY  2007    Family History  Problem Relation Age of Onset   Hypertension Mother    Alcohol abuse Father    Diabetes Maternal Grandmother    Hypertension Maternal Grandmother    Cancer Paternal Grandfather        skin    Social History   Socioeconomic History   Marital status:  Single    Spouse name: Not on file   Number of children: Not on file   Years of education: Not on file   Highest education level: Not on file  Occupational History   Not on file  Tobacco Use   Smoking status: Never   Smokeless tobacco: Never   Tobacco comments:    Vape - Reduced Nicotine  Vaping Use   Vaping Use: Former   Substances: Nicotine  Substance and Sexual Activity   Alcohol use: No   Drug use: No   Sexual activity: Yes    Partners: Male    Birth control/protection: None  Other Topics Concern   Not on file  Social History Narrative   Not on file   Social Determinants of Health   Financial Resource Strain: Low Risk  (10/25/2021)   Overall Financial Resource Strain (CARDIA)    Difficulty of Paying Living Expenses: Not very hard  Food Insecurity: No Food Insecurity (10/25/2021)   Hunger Vital Sign    Worried About Running Out of Food in the Last Year: Never true    [place] in the Last Year: Never true  Transportation Needs: Unmet Transportation Needs (10/25/2021)   PRAPARE - Administrator, Civil Service (Medical): Yes    Lack of Transportation (Non-Medical): No  Physical Activity: Insufficiently Active (01/26/2019)   Exercise Vital Sign    Days of Exercise per Week: 4 days    Minutes of Exercise per Session: 20 min  Stress: Stress Concern Present (10/25/2021)   [employer] of Occupational Health - Occupational Stress Questionnaire    Feeling of Stress : Rather much  Social Connections: Unknown (01/26/2019)   Social Connection and Isolation Panel [NHANES]    Frequency of Communication with Friends and Family: Patient refused    Frequency of Social Gatherings with Friends and Family: Patient refused    Attends Religious Services: Patient refused    Active Member of Clubs or Organizations: Patient refused    Attends Banker Meetings: Patient refused    Marital Status: Patient refused  Intimate Partner Violence: Not At Risk  (01/26/2019)   Humiliation, Afraid, Rape, and Kick questionnaire    Fear of Current or Ex-Partner: No    Emotionally Abused: No    Physically Abused: No    Sexually Abused: No    Allergies  Allergen Reactions   Hydrocodone Other (See Comments)    Other Reaction: SEVERE HA & GI Upset   Oxycodone Other (See Comments)    Other Reaction: SEVERE HA & GI Upset   Tape Rash    No current facility-administered medications on file prior to encounter.   Current Outpatient Medications on File Prior to Encounter  Medication Sig Dispense Refill   buPROPion (WELLBUTRIN SR) 150 MG 12 hr tablet Take 1 tablet (150 mg total) by mouth daily. 90 tablet 3   Cholecalciferol (VITAMIN D) 50 MCG (2000 UT) CAPS Take 1 capsule by mouth daily.     FLUoxetine (PROZAC) 20 MG tablet Take 1 tablet (20 mg total) by mouth daily. 90 tablet 3   vitamin B-12 (CYANOCOBALAMIN) 1000 MCG tablet Take by mouth daily.       ROS Pertinent positives and negative per HPI, all others reviewed and negative  Physical Exam   BP 133/81 (BP Location: Right Arm)   Pulse (!) 103   Temp 98.5 F (36.9 C) (Oral)   Resp 18   Ht 5\' 6"  (1.676 m)   Wt 97.1 kg   LMP 02/20/2022 (Exact Date)   SpO2 100%   BMI 34.54 kg/m   Patient Vitals for the past 24 hrs:  BP Temp Temp src Pulse Resp SpO2 Height Weight  07/13/22 1923 -- -- -- -- 18 -- -- --  07/13/22 1848 133/81 98.5 F (36.9 C) Oral (!) 103 17 100 % 5\' 6"  (1.676 m) 97.1 kg    Physical Exam Vitals reviewed.  Constitutional:      General: She is not in acute distress.    Appearance: She is well-developed. She is not diaphoretic.  Eyes:     General: No scleral icterus. Pulmonary:     Effort: Pulmonary effort is normal. No respiratory distress.  Skin:    General: Skin is warm and dry.  Neurological:     Mental Status: She is alert.     Coordination: Coordination normal.      Cervical Exam    Bedside Ultrasound Pt informed that the ultrasound is considered a  limited OB ultrasound and is not intended to be a complete ultrasound exam.  Patient also informed that the ultrasound is not being completed with the intent of assessing  for fetal or placental anomalies or any pelvic abnormalities.  Explained that the purpose of today's ultrasound is to assess for  viability.  Patient acknowledges the purpose of the exam and the limitations of the study.    My interpretation: viable IUP, FHR 162 bpm, CRL roughly 8 weeks   Labs No results found for this or any previous visit (from the past 24 hour(s)).  Imaging No results found.  MAU Course  Procedures Lab Orders         Urinalysis, Routine w reflex microscopic Urine, Clean Catch    No orders of the defined types were placed in this encounter.  Imaging Orders  No imaging studies ordered today    MDM moderate  Assessment and Plan  #Vaginal bleeding in pregnancy, first trimester #[redacted] weeks gestation of pregnancy US shows viable IUP, roughly [redacted] weeks gestation. We discussed that vaginal bleeding in the first trimester is common, and that 80-90% of patients will go on to have a normal pregnancy with a live delivery. The remainder are at increased risk for miscarriage, unfortunately there are no known interventions to mitigate this risk. Blood type O+, rhogam not indicated. We discussed return precautions including crescendo abdominal pain, heavy vaginal bleeding soaking >1 pad/hour, and fever.  #FWB FHR 162 bpm    Dispo: discharged to home in stable condition.   Venora Maples, MD/MPH 07/13/22 7:25 PM  Allergies as of 07/13/2022       Reactions   Hydrocodone Other (See Comments)   Other Reaction: SEVERE HA & GI Upset   Oxycodone Other (See Comments)   Other Reaction: SEVERE HA & GI Upset   Tape Rash        Medication List     STOP taking these medications    doxycycline 100 MG tablet Commonly known as: VIBRA-TABS   Ozempic (0.25 or 0.5 MG/DOSE) 2 MG/1.5ML Sopn Generic drug:  Semaglutide(0.25 or 0.5MG /DOS)   phentermine 37.5 MG capsule   Semaglutide-Weight Management 0.25 MG/0.5ML Soaj   SUMAtriptan 25 MG tablet Commonly known as: IMITREX       TAKE these medications    buPROPion 150 MG 12 hr tablet Commonly known as: Wellbutrin SR Take 1 tablet (150 mg total) by mouth daily.   cyanocobalamin 1000 MCG tablet Commonly known as: VITAMIN B12 Take by mouth daily.   FLUoxetine 20 MG tablet Commonly known as: PROZAC Take 1 tablet (20 mg total) by mouth daily.   Vitamin D 50 MCG (2000 UT) Caps Take 1 capsule by mouth daily.

## 2022-07-13 NOTE — MAU Note (Signed)
Patient called out stating she has to leave. MD notified.

## 2022-07-13 NOTE — MAU Note (Addendum)
...  Kimberly Farmer is a 26 y.o. at Unknown here in MAU reporting: Vaginal bleeding that began this past Monday. She reports since then her vaginal bleeding has been scant in amount and only sees the blood when she wipes. She reports earlier today she noted a light amount of blood on her underwear around 1100 with "glitter sized" blood clots. Since then it has returned to just when she wipes. Denies current pain but reports she had slight lower abdominal cramping earlier today. Denies vaginal odors and vaginal itching.  Received [place] on 9/5. She reports they were unable to visualize anything.  Patient has her 56 month old child with her who was diagnosed with HFM today.  Initial OB Care with [facility] and had switched her care to [facility] due to it being closer to her home.  LMP: PCOS Onset of complaint: This past Monday. 07/10/2022. Pain score: Denies pain.  Lab orders placed from triage: UA

## 2022-08-25 ENCOUNTER — Other Ambulatory Visit (HOSPITAL_COMMUNITY)
Admission: RE | Admit: 2022-08-25 | Discharge: 2022-08-25 | Disposition: A | Discharge status: Home / Self Care | Payer: Medicaid Other | Source: Ambulatory Visit | Attending: Obstetrics and Gynecology | Admitting: Obstetrics and Gynecology

## 2022-08-25 ENCOUNTER — Encounter: Payer: Self-pay | Admitting: Obstetrics and Gynecology

## 2022-08-25 ENCOUNTER — Ambulatory Visit (INDEPENDENT_AMBULATORY_CARE_PROVIDER_SITE_OTHER): Payer: Medicaid Other | Admitting: Obstetrics and Gynecology

## 2022-08-25 VITALS — BP 113/75 | HR 93 | Wt 225.0 lb

## 2022-08-25 DIAGNOSIS — Z3480 Encounter for supervision of other normal pregnancy, unspecified trimester: Secondary | ICD-10-CM | POA: Insufficient documentation

## 2022-08-25 DIAGNOSIS — Z8759 Personal history of other complications of pregnancy, childbirth and the puerperium: Secondary | ICD-10-CM

## 2022-08-25 DIAGNOSIS — F419 Anxiety disorder, unspecified: Secondary | ICD-10-CM

## 2022-08-25 DIAGNOSIS — O99210 Obesity complicating pregnancy, unspecified trimester: Secondary | ICD-10-CM

## 2022-08-25 DIAGNOSIS — Z3482 Encounter for supervision of other normal pregnancy, second trimester: Secondary | ICD-10-CM

## 2022-08-25 DIAGNOSIS — Z6836 Body mass index (BMI) 36.0-36.9, adult: Secondary | ICD-10-CM

## 2022-08-25 DIAGNOSIS — Z331 Pregnant state, incidental: Secondary | ICD-10-CM

## 2022-08-25 DIAGNOSIS — O34219 Maternal care for unspecified type scar from previous cesarean delivery: Secondary | ICD-10-CM

## 2022-08-25 DIAGNOSIS — F32A Depression, unspecified: Secondary | ICD-10-CM

## 2022-08-25 DIAGNOSIS — O0990 Supervision of high risk pregnancy, unspecified, unspecified trimester: Secondary | ICD-10-CM | POA: Insufficient documentation

## 2022-08-25 DIAGNOSIS — T8332XS Displacement of intrauterine contraceptive device, sequela: Secondary | ICD-10-CM

## 2022-08-25 MED ORDER — ASPIRIN 81 MG PO TBEC: 81.0000 mg | DELAYED_RELEASE_TABLET | Freq: Every day | ORAL | 1 refills | Status: DC

## 2022-08-25 NOTE — Progress Notes (Unsigned)
NOB EDD 02/19/2023  Genetic Screening: Desires and wants to know gender.   U/S 06/20/2022  CC: None

## 2022-08-27 LAB — COMPREHENSIVE METABOLIC PANEL
ALT: 9 IU/L (ref 0–32)
AST: 12 IU/L (ref 0–40)
Albumin/Globulin Ratio: 1.6 (ref 1.2–2.2)
Albumin: 3.9 g/dL — ABNORMAL LOW (ref 4.0–5.0)
Alkaline Phosphatase: 65 IU/L (ref 44–121)
BUN/Creatinine Ratio: 12 (ref 9–23)
BUN: 6 mg/dL (ref 6–20)
Bilirubin Total: <0.2 mg/dL (ref 0.0–1.2)
CO2: 22 mmol/L (ref 20–29)
Calcium: 9 mg/dL (ref 8.7–10.2)
Chloride: 104 mmol/L (ref 96–106)
Creatinine, Ser: 0.51 mg/dL — ABNORMAL LOW (ref 0.57–1.00)
Globulin, Total: 2.4 g/dL (ref 1.5–4.5)
Glucose: 66 mg/dL — ABNORMAL LOW (ref 70–99)
Potassium: 3.9 mmol/L (ref 3.5–5.2)
Sodium: 137 mmol/L (ref 134–144)
Total Protein: 6.3 g/dL (ref 6.0–8.5)
eGFR: 132 mL/min/{1.73_m2} (ref >59)

## 2022-08-27 LAB — CBC/D/PLT+RPR+RH+ABO+RUBIGG...
Antibody Screen: NEGATIVE
Basophils Absolute: 0 10*3/uL (ref 0.0–0.2)
Basos: 0 %
EOS (ABSOLUTE): 0.1 10*3/uL (ref 0.0–0.4)
Eos: 1 %
HCV Ab: NONREACTIVE
HIV Screen 4th Generation wRfx: NONREACTIVE
Hematocrit: 34.8 % (ref 34.0–46.6)
Hemoglobin: 10.7 g/dL — ABNORMAL LOW (ref 11.1–15.9)
Hepatitis B Surface Ag: NEGATIVE
Immature Grans (Abs): 0 10*3/uL (ref 0.0–0.1)
Immature Granulocytes: 0 %
Lymphocytes Absolute: 2 10*3/uL (ref 0.7–3.1)
Lymphs: 28 %
MCH: 23.4 pg — ABNORMAL LOW (ref 26.6–33.0)
MCHC: 30.7 g/dL — ABNORMAL LOW (ref 31.5–35.7)
MCV: 76 fL — ABNORMAL LOW (ref 79–97)
Monocytes Absolute: 0.4 10*3/uL (ref 0.1–0.9)
Monocytes: 5 %
Neutrophils Absolute: 4.7 10*3/uL (ref 1.4–7.0)
Neutrophils: 66 %
Platelets: 261 10*3/uL (ref 150–450)
RBC: 4.57 x10E6/uL (ref 3.77–5.28)
RDW: 15.9 % — ABNORMAL HIGH (ref 11.7–15.4)
RPR Ser Ql: NONREACTIVE
Rh Factor: POSITIVE
Rubella Antibodies, IGG: 2.56 index (ref >0.99)
WBC: 7.1 10*3/uL (ref 3.4–10.8)

## 2022-08-27 LAB — HCV INTERPRETATION

## 2022-08-27 LAB — PROTEIN / CREATININE RATIO, URINE
Creatinine, Urine: 125.6 mg/dL
Protein, Ur: 15.7 mg/dL
Protein/Creat Ratio: 125 mg/g creat (ref 0–200)

## 2022-08-27 LAB — HEMOGLOBIN A1C
Est. average glucose Bld gHb Est-mCnc: 111 mg/dL
Hgb A1c MFr Bld: 5.5 % (ref 4.8–5.6)

## 2022-08-27 LAB — TSH RFX ON ABNORMAL TO FREE T4: TSH: 0.936 u[IU]/mL (ref 0.450–4.500)

## 2022-08-28 ENCOUNTER — Encounter: Payer: Self-pay | Admitting: Obstetrics and Gynecology

## 2022-08-28 DIAGNOSIS — O99019 Anemia complicating pregnancy, unspecified trimester: Secondary | ICD-10-CM | POA: Insufficient documentation

## 2022-08-28 DIAGNOSIS — R8271 Bacteriuria: Secondary | ICD-10-CM | POA: Insufficient documentation

## 2022-08-28 LAB — URINE CULTURE, OB REFLEX

## 2022-08-28 LAB — CULTURE, OB URINE

## 2022-08-28 LAB — CERVICOVAGINAL ANCILLARY ONLY
Chlamydia: NEGATIVE
Comment: NEGATIVE
Comment: NORMAL
Neisseria Gonorrhea: NEGATIVE

## 2022-08-28 MED ORDER — FERROUS SULFATE 325 (65 FE) MG PO TABS: 325.0000 mg | ORAL_TABLET | ORAL | 1 refills | Status: DC

## 2022-08-28 NOTE — Progress Notes (Signed)
New OB Note  08/25/2022   Clinic: Center for [facility]  Chief Complaint: New OB   History of Present Illness: [name] is a 26 y.o. [id number] @ 14/4 weeks (EDC 5/6, based on Patient's last menstrual period was 05/15/2022.).  Preg complicated by has F/H of alcoholism; BMI 36.0-36.9,adult; PCOS (polycystic ovarian syndrome); Obesity in pregnancy; History of gestational hypertension; History of cesarean delivery affecting pregnancy; Anxiety and depression; Malaise and fatigue; Migraine with aura and without status migrainosus, not intractable; Easy bruising; Chronic pain of right ankle; Left carpal tunnel syndrome; Reactive cervical lymphadenopathy; Prediabetes; IUD failure, pregnant; Positive pregnancy test; and Supervision of other normal pregnancy, antepartum on their problem list.   No n/v of pregnancy or SAB s/s.   ROS: A 12-point review of systems was performed and negative, except as stated in the above HPI.  OBGYN History: As per HPI. OB History  Gravida Para Term Preterm AB Living  5 3 3  0 1 3  SAB IAB Ectopic Multiple Live Births  1 0 0 0 3    # Outcome Date GA Lbr Len/2nd Weight Sex Delivery Anes PTL Lv  5 Current           4 Term 03/02/21 [redacted]w[redacted]d    CS-Unspec     3 Term 02/21/19 [redacted]w[redacted]d  9 lb 8.4 oz (4.32 kg) M CS-LTranv Spinal, EPI  LIV  2 Term 04/28/17 [redacted]w[redacted]d 02:57 / 03:53 7 lb 14.8 oz (3.595 kg) M Vag-Spont EPI  LIV  1 SAB 10/16/14 [redacted]w[redacted]d    SAB      History of pap smears: Yes. Last pap smear May 2023 and results were negative   Past Medical History: Past Medical History:  Diagnosis Date   Arthritis    Bilateral knees and back   Breakthrough bleeding on birth control pills 05/16/2018   Cesarean wound infection 03/18/2019   Choroid plexus cysts, fetal, affecting care of mother, antepartum 09/17/2018   Gestational hypertension 03/01/2021   Lumbar disc disease    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   PCOS (polycystic ovarian syndrome)    Post-dates pregnancy  02/21/2019   Pregnancy induced hypertension    Tick bite of left breast 02/24/2022   Yellow eyes 02/08/2022    Past Surgical History: Past Surgical History:  Procedure Laterality Date   CESAREAN SECTION N/A 02/21/2019   Procedure: CESAREAN SECTION;  Surgeon: 04/23/2019, MD;  Location: [facility] ORS;  Service: Obstetrics;  Laterality: N/A;   CESAREAN SECTION N/A 03/02/2021   Procedure: CESAREAN SECTION;  Surgeon: 03/04/2021, MD;  Location: [facility] ORS;  Service: Obstetrics;  Laterality: N/A;   KNEE SURGERY Left 2011   2012 has had 5 surgeries on her knee   TONSILLECTOMY AND ADENOIDECTOMY  2007    Family History:  Family History  Problem Relation Age of Onset   Hypertension Mother    Alcohol abuse Father    Diabetes Maternal Grandmother    Hypertension Maternal Grandmother    Cancer Paternal Grandfather        skin    Social History:  Social History   Socioeconomic History   Marital status: Single    Spouse name: Not on file   Number of children: Not on file   Years of education: Not on file   Highest education level: Not on file  Occupational History   Not on file  Tobacco Use   Smoking status: Never   Smokeless tobacco: Never   Tobacco comments:    Vape -  Reduced Nicotine  Vaping Use   Vaping Use: Former   Substances: Nicotine  Substance and Sexual Activity   Alcohol use: No   Drug use: No   Sexual activity: Yes    Partners: Male    Birth control/protection: None  Other Topics Concern   Not on file  Social History Narrative   Not on file   Social Determinants of Health   Financial Resource Strain: Low Risk  (10/25/2021)   Overall Financial Resource Strain (CARDIA)    Difficulty of Paying Living Expenses: Not very hard  Food Insecurity: No Food Insecurity (10/25/2021)   Hunger Vital Sign    Worried About Running Out of Food in the Last Year: Never true    Ran Out of Food in the Last Year: Never true  Transportation Needs: Unmet Transportation Needs  (10/25/2021)   PRAPARE - Administrator, Civil Service (Medical): Yes    Lack of Transportation (Non-Medical): No  Physical Activity: Insufficiently Active (01/26/2019)   Exercise Vital Sign    Days of Exercise per Week: 4 days    Minutes of Exercise per Session: 20 min  Stress: Stress Concern Present (10/25/2021)   [employer] of Occupational Health - Occupational Stress Questionnaire    Feeling of Stress : Rather much  Social Connections: Unknown (01/26/2019)   Social Connection and Isolation Panel [NHANES]    Frequency of Communication with Friends and Family: Patient refused    Frequency of Social Gatherings with Friends and Family: Patient refused    Attends Religious Services: Patient refused    Active Member of Clubs or Organizations: Patient refused    Attends Banker Meetings: Patient refused    Marital Status: Patient refused  Intimate Partner Violence: Not At Risk (01/26/2019)   Humiliation, Afraid, Rape, and Kick questionnaire    Fear of Current or Ex-Partner: No    Emotionally Abused: No    Physically Abused: No    Sexually Abused: No    Allergy: Allergies  Allergen Reactions   Hydrocodone Other (See Comments)    Other Reaction: SEVERE HA & GI Upset   Oxycodone Other (See Comments)    Other Reaction: SEVERE HA & GI Upset   Tape Rash    Current Outpatient Medications: Prenatal vitamin  Physical Exam:   BP 113/75   Pulse 93   Wt 225 lb (102.1 kg)   LMP 05/15/2022   BMI 36.32 kg/m  Body mass index is 36.32 kg/m. Contractions: Not present Vag. Bleeding: None. FHTs: 160s  General appearance: Well nourished, well developed female in no acute distress.  Neck:  Supple, normal appearance, and no thyromegaly  Cardiovascular: S1, S2 normal, no murmur, rub or gallop, regular rate and rhythm Respiratory:  Clear to auscultation bilateral. Normal respiratory effort Abdomen: positive bowel sounds and no masses, hernias; diffusely non  tender to palpation, non distended Breasts: no breast s/s. Neuro/Psych:  Normal mood and affect.  Skin:  Warm and dry.   Laboratory: none  Imaging:  ADDENDUM REPORT: 06/20/2022 15:32   ADDENDUM: Consider close OB follow-up for above findings.   These results will be called to the ordering clinician or representative by the Radiologist Assistant, and communication documented in the PACS or [employer].     Electronically Signed   By: Donzetta Kohut M.D.   On: 06/20/2022 15:32    Addended by Jule Economy, MD on 06/20/2022  3:34 PM   Study Result  Narrative & Impression  CLINICAL DATA:  Positive pregnancy test on 06/16/2022 with beta HCG of 349 at that time. No recent beta HCG or repeat beta HCG noted in the system. Patient reportedly with IUD. Unsure of last menstrual cycle. Reports history of PCOS and irregular menses.   EXAM: OBSTETRIC <14 WK US AND TRANSVAGINAL OB US   TECHNIQUE: Both transabdominal and transvaginal ultrasound examinations were performed for complete evaluation of the gestation as well as the maternal uterus, adnexal regions, and pelvic cul-de-sac. Transvaginal technique was performed to assess early pregnancy.   COMPARISON:  None Available.   FINDINGS: Intrauterine gestational sac: Tiny cystic area within the endometrium. Endometrium is thickened and echogenic up to 2 cm greatest thickness. The reported IUD D is not visualized on the current study. There is no visible fetal pole or yolk sac.   Yolk sac:  Not visualized   Embryo:  Not visualized   3.6 mm mean diameter of cystic area within the echogenic and thickened endometrium. If this represents a gestational sac it would be a gestational sac of 5 weeks 1 day estimated gestational age.   Subchorionic hemorrhage:  None visualized.   Maternal uterus/adnexae: Thickened endometrium up to 2 cm. Echogenic material in the endometrial canal.   RIGHT ovary 3.4 x 2.1 x 2.3 cm.   LEFT  ovary 3.1 x 2.0 x 2.4 cm. No free fluid in the pelvis. Small cyst or follicle in the LEFT ovary.   IMPRESSION: Thickened endometrium with very small cystic area near the fundal portion of the endometrium raising the question of tiny gestational sac. Would treat this as a pregnancy of unknown anatomic location given lack of fetal pole or yolk sac and very small area of cystic change within the uterine fundus. Differential remains IUP too early to visualize though potentially intrauterine as outlined above, occult ectopic and recent spontaneous miscarriage. Suggest ultrasound follow-up in 7-10 days or sooner as warranted based on clinical symptoms or changes that may occur such is vaginal bleeding or pain. Would also correlate with more recent/current beta HCG.   IUD is not visible on the current exam. Endometrium is somewhat echogenic which could potentially obscure the IUD. Correlate with any potential for passage of IUD in the interim and with direct clinical inspection for signs of IUD string within the vagina.   Given echogenic appearance of the endometrium on endovaginal images would also correlate with any current evidence of vaginal bleeding.   Electronically Signed: By: Donzetta KohutGeoffrey  Wile M.D. On: 06/20/2022 15:11    Assessment: patient doing well  Plan: 1. Supervision of other normal pregnancy, antepartum Routine care. Offer afp next visit - CBC/D/Plt+RPR+Rh+ABO+RubIgG... - Culture, OB Urine - Cervicovaginal ancillary only - Hemoglobin A1c - Comprehensive metabolic panel - TSH Rfx on Abnormal to Free T4 - Protein / creatinine ratio, urine - US MFM OB DETAIL +14 WK; Future  2. History of cesarean delivery affecting pregnancy X2. D/w her re: repeat c-section later in pregnancy  3. BMI 36.0-36.9,adult 25-30lbs total weight gain goal for pregnancy. 10-15lbs so far this pregnancy - US MFM OB DETAIL +14 WK; Future  4. Obesity in pregnancy - US MFM OB DETAIL +14 WK;  Future  5. Intrauterine contraceptive device threads lost, sequela I told her that since she would be for a repeat c-section, I recommend getting an x-ray early in the third trimester to see if the IUD could be abdominal. Radiation from x-ray would be no risk to pregnancy; no e/o IUD on u/s  6. Encounter for supervision of other  normal pregnancy in second trimester - PANORAMA PRENATAL TEST FULL PANEL  7. Anxiety and depression On prozac and wellbutrin  8. History of gestational hypertension Pt amenable to doing low dose ASA again  Problem list reviewed and updated.  Follow up in 3-4 weeks.  >50% of 35 min visit spent on counseling and coordination of care.     Cornelia Copa MD Attending Center for [facility])

## 2022-08-28 NOTE — Addendum Note (Signed)
Addended by: [place] Bing on: 08/28/2022 01:35 PM   Modules accepted: Orders

## 2022-09-02 LAB — PANORAMA PRENATAL TEST FULL PANEL:PANORAMA TEST PLUS 5 ADDITIONAL MICRODELETIONS: FETAL FRACTION: 3.8

## 2022-09-22 ENCOUNTER — Encounter: Payer: Medicaid Other | Admitting: Obstetrics & Gynecology

## 2022-09-25 ENCOUNTER — Encounter: Payer: Self-pay | Admitting: Family Medicine

## 2022-09-26 ENCOUNTER — Ambulatory Visit: Payer: Medicaid Other | Attending: Obstetrics and Gynecology

## 2022-09-26 ENCOUNTER — Emergency Department
Admission: EM | Admit: 2022-09-26 | Discharge: 2022-09-26 | Disposition: A | Discharge status: Home / Self Care | Payer: Medicaid Other | Attending: Emergency Medicine | Admitting: Emergency Medicine

## 2022-09-26 ENCOUNTER — Ambulatory Visit: Payer: Medicaid Other

## 2022-09-26 ENCOUNTER — Other Ambulatory Visit: Payer: Self-pay

## 2022-09-26 ENCOUNTER — Emergency Department: Payer: Medicaid Other

## 2022-09-26 DIAGNOSIS — O26892 Other specified pregnancy related conditions, second trimester: Secondary | ICD-10-CM | POA: Insufficient documentation

## 2022-09-26 DIAGNOSIS — R252 Cramp and spasm: Secondary | ICD-10-CM | POA: Diagnosis not present

## 2022-09-26 DIAGNOSIS — O2342 Unspecified infection of urinary tract in pregnancy, second trimester: Secondary | ICD-10-CM | POA: Insufficient documentation

## 2022-09-26 LAB — CBC WITH DIFFERENTIAL/PLATELET
Abs Immature Granulocytes: 0.07 10*3/uL (ref 0.00–0.07)
Basophils Absolute: 0 10*3/uL (ref 0.0–0.1)
Basophils Relative: 0 %
Eosinophils Absolute: 0.1 10*3/uL (ref 0.0–0.5)
Eosinophils Relative: 1 %
HCT: 35.7 % — ABNORMAL LOW (ref 36.0–46.0)
Hemoglobin: 11 g/dL — ABNORMAL LOW (ref 12.0–15.0)
Immature Granulocytes: 1 %
Lymphocytes Relative: 18 %
Lymphs Abs: 2.3 10*3/uL (ref 0.7–4.0)
MCH: 23.8 pg — ABNORMAL LOW (ref 26.0–34.0)
MCHC: 30.8 g/dL (ref 30.0–36.0)
MCV: 77.1 fL — ABNORMAL LOW (ref 80.0–100.0)
Monocytes Absolute: 0.7 10*3/uL (ref 0.1–1.0)
Monocytes Relative: 5 %
Neutro Abs: 10 10*3/uL — ABNORMAL HIGH (ref 1.7–7.7)
Neutrophils Relative %: 75 %
Platelets: 288 10*3/uL (ref 150–400)
RBC: 4.63 MIL/uL (ref 3.87–5.11)
RDW: 15.9 % — ABNORMAL HIGH (ref 11.5–15.5)
WBC: 13.3 10*3/uL — ABNORMAL HIGH (ref 4.0–10.5)
nRBC: 0 % (ref 0.0–0.2)

## 2022-09-26 LAB — URINALYSIS, ROUTINE W REFLEX MICROSCOPIC
Bilirubin Urine: NEGATIVE
Glucose, UA: NEGATIVE mg/dL
Hgb urine dipstick: NEGATIVE
Ketones, ur: NEGATIVE mg/dL
Nitrite: NEGATIVE
Protein, ur: NEGATIVE mg/dL
Specific Gravity, Urine: 1.011 (ref 1.005–1.030)
pH: 5 (ref 5.0–8.0)

## 2022-09-26 LAB — COMPREHENSIVE METABOLIC PANEL
ALT: 11 U/L (ref 0–44)
AST: 18 U/L (ref 15–41)
Albumin: 3.3 g/dL — ABNORMAL LOW (ref 3.5–5.0)
Alkaline Phosphatase: 65 U/L (ref 38–126)
Anion gap: 7 (ref 5–15)
BUN: 6 mg/dL (ref 6–20)
CO2: 23 mmol/L (ref 22–32)
Calcium: 9.1 mg/dL (ref 8.9–10.3)
Chloride: 108 mmol/L (ref 98–111)
Creatinine, Ser: 0.55 mg/dL (ref 0.44–1.00)
GFR, Estimated: >60 mL/min (ref >60)
Glucose, Bld: 109 mg/dL — ABNORMAL HIGH (ref 70–99)
Potassium: 3.5 mmol/L (ref 3.5–5.1)
Sodium: 138 mmol/L (ref 135–145)
Total Bilirubin: 0.2 mg/dL — ABNORMAL LOW (ref 0.3–1.2)
Total Protein: 7.2 g/dL (ref 6.5–8.1)

## 2022-09-26 LAB — LIPASE, BLOOD: Lipase: 53 U/L — ABNORMAL HIGH (ref 11–51)

## 2022-09-26 LAB — POC URINE PREG, ED: Preg Test, Ur: POSITIVE — AB

## 2022-09-26 MED ORDER — SODIUM CHLORIDE 0.9 % IV BOLUS
1000.0000 mL | Freq: Once | INTRAVENOUS | Status: AC
  Administered 2022-09-26: 1000 mL via INTRAVENOUS

## 2022-09-26 MED ORDER — CEPHALEXIN 500 MG PO CAPS
500.0000 mg | ORAL_CAPSULE | Freq: Once | ORAL | Status: AC
  Administered 2022-09-26: 500 mg via ORAL
  Filled 2022-09-26: qty 1

## 2022-09-26 MED ORDER — CEPHALEXIN 500 MG PO CAPS: 500.0000 mg | ORAL_CAPSULE | Freq: Four times a day (QID) | ORAL | 0 refills | Status: AC

## 2022-09-26 NOTE — Discharge Instructions (Addendum)
Take Keflex four times daily for the next seven days.  

## 2022-09-26 NOTE — ED Triage Notes (Addendum)
Patient complains of cramping and is 19 weeks 1 day pregnant.  Denies vaginal bleeding. FHT obtained in triage just left lower umbilicus region.

## 2022-09-26 NOTE — ED Provider Triage Note (Signed)
Emergency Medicine Provider Triage Evaluation Note  Kimberly Farmer , a 26 y.o. female  was evaluated in triage.  G5, P3, currently [redacted] weeks pregnant, presents to the emergency department with cramping that started today without vaginal bleeding.  Patient denies dysuria, hematuria or increased urinary frequency.  No chest pain, chest tightness or shortness of breath.  Review of Systems  Positive: Patient has pelvic pain and cramping.  Negative: No chest pain or chest tightness.   Physical Exam  BP 116/87 (BP Location: Left Arm)   Pulse (!) 106   Temp 98.4 F (36.9 C) (Oral)   Resp (!) 25   LMP 05/15/2022   SpO2 100%  Gen:   Awake, no distress   Resp:  Normal effort  MSK:   Moves extremities without difficulty  Other:    Medical Decision Making  Medically screening exam initiated at 6:17 PM.  Appropriate orders placed.  Kimberly Farmer was informed that the remainder of the evaluation will be completed by another provider, this initial triage assessment does not replace that evaluation, and the importance of remaining in the ED until their evaluation is complete.     Pia Mau [place], [place] 09/26/22 1819

## 2022-09-26 NOTE — ED Provider Notes (Signed)
[facility] Provider Note  Patient Contact: 8:22 PM (approximate)   History   Abdominal Cramping and Pregnancy Ultrasound   HPI  Kimberly Farmer is a 26 y.o. female   G5, P3, currently [redacted] weeks pregnant, presents to the emergency department with cramping that started today without vaginal bleeding.  Patient denies dysuria, hematuria or increased urinary frequency.  No chest pain, chest tightness or shortness of breath.      Physical Exam   Triage Vital Signs: ED Triage Vitals  Enc Vitals Group     BP 09/26/22 1812 116/87     Pulse Rate 09/26/22 1812 (!) 106     Resp 09/26/22 1812 (!) 25     Temp 09/26/22 1812 98.4 F (36.9 C)     Temp Source 09/26/22 1812 Oral     SpO2 09/26/22 1812 100 %     Weight 09/26/22 1820 225 lb (102.1 kg)     Height 09/26/22 1820 5\' 6"  (1.676 m)     Head Circumference --      Peak Flow --      Pain Score 09/26/22 1820 4     Pain Loc --      Pain Edu? --      Excl. in GC? --     Most recent vital signs: Vitals:   09/26/22 1812  BP: 116/87  Pulse: (!) 106  Resp: (!) 25  Temp: 98.4 F (36.9 C)  SpO2: 100%     General: Alert and in no acute distress. Eyes:  PERRL. EOMI. Head: No acute traumatic findings ENT:      Nose: No congestion/rhinnorhea.      Mouth/Throat: Mucous membranes are moist. Neck: No stridor. No cervical spine tenderness to palpation. Cardiovascular:  Good peripheral perfusion Respiratory: Normal respiratory effort without tachypnea or retractions. Lungs CTAB. Good air entry to the bases with no decreased or absent breath sounds. Gastrointestinal: Bowel sounds 4 quadrants. Soft and nontender to palpation. No guarding or rigidity. No palpable masses. No distention. No CVA tenderness. Musculoskeletal: Full range of motion to all extremities.  Neurologic:  No gross focal neurologic deficits are appreciated.  Skin:   No rash noted Other:   ED Results / Procedures / Treatments   Labs (all  labs ordered are listed, but only abnormal results are displayed) Labs Reviewed  CBC WITH DIFFERENTIAL/PLATELET - Abnormal; Notable for the following components:      Result Value   WBC 13.3 (*)    Hemoglobin 11.0 (*)    HCT 35.7 (*)    MCV 77.1 (*)    MCH 23.8 (*)    RDW 15.9 (*)    Neutro Abs 10.0 (*)    All other components within normal limits  COMPREHENSIVE METABOLIC PANEL - Abnormal; Notable for the following components:   Glucose, Bld 109 (*)    Albumin 3.3 (*)    Total Bilirubin 0.2 (*)    All other components within normal limits  LIPASE, BLOOD - Abnormal; Notable for the following components:   Lipase 53 (*)    All other components within normal limits  URINALYSIS, ROUTINE W REFLEX MICROSCOPIC - Abnormal; Notable for the following components:   Color, Urine YELLOW (*)    APPearance HAZY (*)    Leukocytes,Ua LARGE (*)    Bacteria, UA RARE (*)    All other components within normal limits  POC URINE PREG, ED - Abnormal; Notable for the following components:   Preg Test, Ur POSITIVE (*)  All other components within normal limits        RADIOLOGY  I personally viewed and evaluated these images as part of my medical decision making, as well as reviewing the written report by the radiologist.  ED Provider Interpretation: Single viable intrauterine pregnancy at 166 bpm.   PROCEDURES:  Critical Care performed: No  Procedures   MEDICATIONS ORDERED IN ED: Medications  sodium chloride 0.9 % bolus 1,000 mL (1,000 mLs Intravenous New Bag/Given 09/26/22 2016)  cephALEXin (KEFLEX) capsule 500 mg (500 mg Oral Given 09/26/22 2018)     IMPRESSION / MDM / ASSESSMENT AND PLAN / ED COURSE  I reviewed the triage vital signs and the nursing notes.                              Assessment and plan: Cramping in Pregnancy:   26 year old female presents to the emergency department with some lower abdominal cramping that she noticed today without vaginal  bleeding.  Patient was tachycardic at triage.  Patient had elevated white blood cell count with left shift.  Urine pregnancy test was positive.  Lipase was mildly elevated.  Urinalysis was concerning for UTI with large amount of leukocytes.  CMP within reference range.  Patient was given normal saline bolus and first dose of Keflex in the emergency department.  Patient's heart rate trended down after supplemental fluids.  OB ultrasound shows single viable intrauterine pregnancy at 166 bpm.  Recommended follow-up with OB/GYN.  Return precautions were given to return with new or worsening symptoms.   FINAL CLINICAL IMPRESSION(S) / ED DIAGNOSES   Final diagnoses:  Urinary tract infection in mother during second trimester of pregnancy     Rx / DC Orders   ED Discharge Orders          Ordered    cephALEXin (KEFLEX) 500 MG capsule  4 times daily        09/26/22 2131             Note:  This document was prepared using Dragon voice recognition software and may include unintentional dictation errors.   Pia Mau [place], PA-C 09/26/22 2146    Merwyn Katos, MD 09/26/22 [phone]

## 2022-09-26 NOTE — ED Triage Notes (Signed)
Called OBGYN upstairs to get pt up there. Per OBGYN pt is only 19 weeks 1 day and needs to be evaluated down in ED.

## 2022-09-26 NOTE — ED Notes (Signed)
E-signature pad unavailable - Pt verbalized understanding of D/C information - no additional concerns at this time.  

## 2022-09-29 ENCOUNTER — Telehealth (INDEPENDENT_AMBULATORY_CARE_PROVIDER_SITE_OTHER): Payer: Medicaid Other | Admitting: Family Medicine

## 2022-09-29 ENCOUNTER — Encounter: Payer: Self-pay | Admitting: Family Medicine

## 2022-09-29 DIAGNOSIS — Z8759 Personal history of other complications of pregnancy, childbirth and the puerperium: Secondary | ICD-10-CM

## 2022-09-29 DIAGNOSIS — O34219 Maternal care for unspecified type scar from previous cesarean delivery: Secondary | ICD-10-CM

## 2022-09-29 DIAGNOSIS — R8271 Bacteriuria: Secondary | ICD-10-CM

## 2022-09-29 DIAGNOSIS — O99342 Other mental disorders complicating pregnancy, second trimester: Secondary | ICD-10-CM

## 2022-09-29 DIAGNOSIS — Z3480 Encounter for supervision of other normal pregnancy, unspecified trimester: Secondary | ICD-10-CM

## 2022-09-29 DIAGNOSIS — R7303 Prediabetes: Secondary | ICD-10-CM

## 2022-09-29 DIAGNOSIS — O99012 Anemia complicating pregnancy, second trimester: Secondary | ICD-10-CM

## 2022-09-29 DIAGNOSIS — Z3A19 19 weeks gestation of pregnancy: Secondary | ICD-10-CM

## 2022-09-29 DIAGNOSIS — F32A Depression, unspecified: Secondary | ICD-10-CM

## 2022-09-29 MED ORDER — HYDROXYZINE HCL 25 MG PO TABS: 25.0000 mg | ORAL_TABLET | Freq: Four times a day (QID) | ORAL | 2 refills | Status: DC | PRN

## 2022-09-29 MED ORDER — SERTRALINE HCL 100 MG PO TABS: 50.0000 mg | ORAL_TABLET | Freq: Every day | ORAL | 11 refills | Status: DC

## 2022-09-29 NOTE — Progress Notes (Signed)
OBSTETRICS PRENATAL VIRTUAL VISIT ENCOUNTER NOTE  Provider location: Center for [facility]   Patient location: Home  I connected with Kimberly Farmer on 09/29/22 at  8:55 AM EST by MyChart Video Encounter and verified that I am speaking with the correct person using two identifiers. I discussed the limitations, risks, security and privacy concerns of performing an evaluation and management service virtually and the availability of in person appointments. I also discussed with the patient that there may be a patient responsible charge related to this service. The patient expressed understanding and agreed to proceed. Subjective:  Kimberly Farmer is a 26 y.o. [phone] at [redacted]w[redacted]d being seen today for ongoing prenatal care.  She is currently monitored for the following issues for this high-risk pregnancy and has F/H of alcoholism; BMI 36.0-36.9,adult; PCOS (polycystic ovarian syndrome); Obesity in pregnancy; History of gestational hypertension; History of cesarean delivery affecting pregnancy; Anxiety and depression; Malaise and fatigue; Migraine with aura and without status migrainosus, not intractable; Easy bruising; Chronic pain of right ankle; Left carpal tunnel syndrome; Reactive cervical lymphadenopathy; Prediabetes; IUD failure, pregnant; Supervision of other normal pregnancy, antepartum; GBS bacteriuria; and Anemia in pregnancy on their problem list.  Patient reports no complaints.  Contractions: Not present.  .  Movement: Absent. Denies any leaking of fluid.   The following portions of the patient's history were reviewed and updated as appropriate: allergies, current medications, past family history, past medical history, past social history, past surgical history and problem list.   Objective:  There were no vitals filed for this visit.  Fetal Status:     Movement: Absent     General:  Alert, oriented and cooperative. Patient is in no acute distress.  Respiratory:  Normal respiratory effort, no problems with respiration noted  Mental Status: Normal mood and affect. Normal behavior. Normal judgment and thought content.  Rest of physical exam deferred due to type of encounter  Imaging: US OB Limited  Result Date: 09/26/2022 CLINICAL DATA:  Pelvic cramping for 1 day. Second trimester pregnancy. EXAM: LIMITED OBSTETRIC ULTRASOUND COMPARISON:  06/20/2022 FINDINGS: Number of Fetuses: 1 Heart Rate:  166 bpm Movement: Present Presentation: Cephalic Placental Location: Posterior-fundal Previa: Absent Amniotic Fluid (Subjective):  Within normal limits. AFI: N/A cm BPD: 4.5 cm 19 w  5 d MATERNAL FINDINGS: Cervix:  Appears closed. Uterus/Adnexae: No abnormality visualized. IMPRESSION: Single living intrauterine pregnancy measuring at 19 weeks 5 days. The cervix appears closed. No acute findings. This exam is performed on an emergent basis and does not comprehensively evaluate fetal size, dating, or anatomy; follow-up complete OB US should be considered if further fetal assessment is warranted. Electronically Signed   By: Gaylyn Rong M.D.   On: 09/26/2022 19:07    Assessment and Plan:  Pregnancy: [id number] at [redacted]w[redacted]d  1. Supervision of other normal pregnancy, antepartum Up to date  2. Anemia during pregnancy in second trimester On Fe  3. GBS bacteriuria Anceph at CS  4. History of cesarean delivery affecting pregnancy CSx2, will need repeat  5. History of gestational hypertension Monitor BP closely BP at ER was 116/87 on 12/12  6. Prediabetes Monitor weight gain  7. Depression affecting pregnancy in second trimester, antepartum Very overwhelmed and tearful on prenatal visit. She has been in counseling before. There is a family history of addiction that is is worried about and does not have any substance use herself. She has three small children (5, 3, 1-- this child has special needs) and is  expecting baby 4 which was not planned and related to IUD  failure. She struggles to have dedicated time for herself and really wants to be a "good mom." History of PPD and depression outside of pregnancy. Was on Wellbutrin and prozac but stopped 1 month ago because they were "not working". Today I started zoloft and hydroxyzine with rapid increase in zoloft.  Consider [employer] matters consult if not responding.  - Ambulatory referral to [place] - sertraline (ZOLOFT) 100 MG tablet; Take 0.5 tablets (50 mg total) by mouth daily. Take 1/2 table for 1 week then transition to 1 whole tablet daily until seen next  Dispense: 30 tablet; Refill: 11 - hydrOXYzine (ATARAX) 25 MG tablet; Take 1 tablet (25 mg total) by mouth every 6 (six) hours as needed for anxiety.  Dispense: 30 tablet; Refill: 2   Preterm labor symptoms and general obstetric precautions including but not limited to vaginal bleeding, contractions, leaking of fluid and fetal movement were reviewed in detail with the patient. I discussed the assessment and treatment plan with the patient. The patient was provided an opportunity to ask questions and all were answered. The patient agreed with the plan and demonstrated an understanding of the instructions. The patient was advised to call back or seek an in-person office evaluation/go to [facility] at [facility] for any urgent or concerning symptoms. Please refer to After Visit Summary for other counseling recommendations.   I provided 30 minutes of face-to-face time during this encounter.  Return in about 4 weeks (around 10/27/2022) for Routine prenatal care with [facility] if possible for next visit. Marland Kitchen  No future appointments.  Caren Macadam, MD Center for [employer] [place] [facility]

## 2022-11-11 ENCOUNTER — Inpatient Hospital Stay (HOSPITAL_BASED_OUTPATIENT_CLINIC_OR_DEPARTMENT_OTHER): Payer: Medicaid Other

## 2022-11-11 ENCOUNTER — Other Ambulatory Visit: Payer: Self-pay

## 2022-11-11 ENCOUNTER — Inpatient Hospital Stay (HOSPITAL_COMMUNITY)
Admission: AD | Admit: 2022-11-11 | Discharge: 2022-11-11 | Disposition: A | Discharge status: Home / Self Care | Payer: Medicaid Other | Attending: Obstetrics & Gynecology | Admitting: Obstetrics & Gynecology

## 2022-11-11 ENCOUNTER — Encounter (HOSPITAL_COMMUNITY): Payer: Self-pay | Admitting: Obstetrics & Gynecology

## 2022-11-11 DIAGNOSIS — O34219 Maternal care for unspecified type scar from previous cesarean delivery: Secondary | ICD-10-CM

## 2022-11-11 DIAGNOSIS — O09292 Supervision of pregnancy with other poor reproductive or obstetric history, second trimester: Secondary | ICD-10-CM

## 2022-11-11 DIAGNOSIS — O368120 Decreased fetal movements, second trimester, not applicable or unspecified: Secondary | ICD-10-CM

## 2022-11-11 DIAGNOSIS — E669 Obesity, unspecified: Secondary | ICD-10-CM | POA: Diagnosis not present

## 2022-11-11 DIAGNOSIS — O99212 Obesity complicating pregnancy, second trimester: Secondary | ICD-10-CM | POA: Diagnosis not present

## 2022-11-11 DIAGNOSIS — Z0371 Encounter for suspected problem with amniotic cavity and membrane ruled out: Secondary | ICD-10-CM | POA: Insufficient documentation

## 2022-11-11 DIAGNOSIS — O210 Mild hyperemesis gravidarum: Secondary | ICD-10-CM | POA: Insufficient documentation

## 2022-11-11 DIAGNOSIS — Z3A25 25 weeks gestation of pregnancy: Secondary | ICD-10-CM | POA: Diagnosis not present

## 2022-11-11 DIAGNOSIS — Z3689 Encounter for other specified antenatal screening: Secondary | ICD-10-CM | POA: Diagnosis not present

## 2022-11-11 DIAGNOSIS — Z3480 Encounter for supervision of other normal pregnancy, unspecified trimester: Secondary | ICD-10-CM

## 2022-11-11 DIAGNOSIS — D649 Anemia, unspecified: Secondary | ICD-10-CM

## 2022-11-11 DIAGNOSIS — O26892 Other specified pregnancy related conditions, second trimester: Secondary | ICD-10-CM

## 2022-11-11 DIAGNOSIS — O99012 Anemia complicating pregnancy, second trimester: Secondary | ICD-10-CM

## 2022-11-11 LAB — URINALYSIS, ROUTINE W REFLEX MICROSCOPIC
Bacteria, UA: NONE SEEN
Bilirubin Urine: NEGATIVE
Glucose, UA: NEGATIVE mg/dL
Hgb urine dipstick: NEGATIVE
Ketones, ur: NEGATIVE mg/dL
Nitrite: NEGATIVE
Protein, ur: NEGATIVE mg/dL
Specific Gravity, Urine: 1.008 (ref 1.005–1.030)
pH: 7 (ref 5.0–8.0)

## 2022-11-11 LAB — AMNISURE RUPTURE OF MEMBRANE (ROM) NOT AT ARMC: Amnisure ROM: NEGATIVE

## 2022-11-11 NOTE — MAU Note (Signed)
Kimberly Farmer is a 27 y.o. at [redacted]w[redacted]d here in MAU reporting: decreased FM, reports hasn't felt much movement today.  Also states had a wet spot in the bed when she woke up, hasn't continued to have any LOF.  Also reports dull aching abdominal pain that began last night.  Denies VB. LMP: NA Onset of complaint: last night Pain score: 3 Vitals:   11/11/22 1307  BP: 130/72  Pulse: 98  Resp: 18  Temp: 98.1 F (36.7 C)  SpO2: 96%     FHT:154 bpm Lab orders placed from triage:   UA

## 2022-11-11 NOTE — MAU Provider Note (Signed)
History     CSN: [id number]  Arrival date and time: 11/11/22 1248   Event Date/Time   First Provider Initiated Contact with Patient 11/11/22 1433      Chief Complaint  Patient presents with   Decreased Fetal Movement   Rupture of Membranes   Nausea   HPI Kimberly Farmer is a a 27 y.o. [id number] at [redacted]w[redacted]d who presents to MAU with chief complaint of decreased fetal movement. This is a new problem, onset today. Patient states thus far her baby has been her "wiggliest of all the kids".  She has not felt fetal movement today and denies fetal movement on arrival to MAU. She states her baby is typically busiest at night.  Patient also reports one episode of a "wet spot" on her bed when she woke up. She denies gush of fluid. She denies continuous leaking.   Patient is intermittently tearful and verbalizes feeling anxious. She has had experienced missed appointments due to child care concerns and is worried about keeping all her children safe and supported.  Patient receives [facility] with [facility].   OB History     Gravida  5   Para  3   Term  3   Preterm  0   AB  1   Living  3      SAB  1   IAB  0   Ectopic  0   Multiple  0   Live Births  3           Past Medical History:  Diagnosis Date   Arthritis    Bilateral knees and back   Breakthrough bleeding on birth control pills 05/16/2018   Cesarean wound infection 03/18/2019   Choroid plexus cysts, fetal, affecting care of mother, antepartum 09/17/2018   Gestational hypertension 03/01/2021   Lumbar disc disease    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   PCOS (polycystic ovarian syndrome)    Post-dates pregnancy 02/21/2019   Pregnancy induced hypertension    Tick bite of left breast 02/24/2022   Yellow eyes 02/08/2022    Past Surgical History:  Procedure Laterality Date   CESAREAN SECTION N/A 02/21/2019   Procedure: CESAREAN SECTION;  Surgeon: Nadara Mustard, MD;  Location: [facility] ORS;  Service: Obstetrics;   Laterality: N/A;   CESAREAN SECTION N/A 03/02/2021   Procedure: CESAREAN SECTION;  Surgeon: Tereso Newcomer, MD;  Location: [facility] ORS;  Service: Obstetrics;  Laterality: N/A;   KNEE SURGERY Left 2011   [name] has had 5 surgeries on her knee   TONSILLECTOMY AND ADENOIDECTOMY  2007    Family History  Problem Relation Age of Onset   Hypertension Mother    Alcohol abuse Father    Diabetes Maternal Grandmother    Hypertension Maternal Grandmother    Cancer Paternal Grandfather        skin    Social History   Tobacco Use   Smoking status: Never   Smokeless tobacco: Never   Tobacco comments:    Vape - Reduced Nicotine  Vaping Use   Vaping Use: Former   Substances: Nicotine  Substance Use Topics   Alcohol use: No   Drug use: No    Allergies:  Allergies  Allergen Reactions   Hydrocodone Other (See Comments)    Other Reaction: SEVERE HA & GI Upset   Oxycodone Other (See Comments)    Other Reaction: SEVERE HA & GI Upset   Tape Rash    Medications Prior to Admission  Medication  Sig Dispense Refill Last Dose   aspirin EC 81 MG tablet Take 1 tablet (81 mg total) by mouth daily. 60 tablet 1 Past Week   ferrous sulfate (FERROUSUL) 325 (65 FE) MG tablet Take 1 tablet (325 mg total) by mouth every other day. 60 tablet 1 Past Week   hydrOXYzine (ATARAX) 25 MG tablet Take 1 tablet (25 mg total) by mouth every 6 (six) hours as needed for anxiety. 30 tablet 2 11/10/2022   sertraline (ZOLOFT) 100 MG tablet Take 0.5 tablets (50 mg total) by mouth daily. Take 1/2 table for 1 week then transition to 1 whole tablet daily until seen next 30 tablet 11 11/10/2022   Cholecalciferol (VITAMIN D) 50 MCG (2000 UT) CAPS Take 1 capsule by mouth daily. (Patient not taking: Reported on 11/11/2022)   Not Taking   vitamin B-12 (CYANOCOBALAMIN) 1000 MCG tablet Take by mouth daily. (Patient not taking: Reported on 11/11/2022)   Not Taking    Review of Systems  Genitourinary:  Positive for pelvic pain.  All  other systems reviewed and are negative.  Physical Exam   Blood pressure 130/72, pulse 98, temperature 98.1 F (36.7 C), temperature source Oral, resp. rate 18, height 5\' 6"  (1.676 m), weight 111.7 kg, last menstrual period 05/15/2022, SpO2 96 %, not currently breastfeeding.  Physical Exam Vitals and nursing note reviewed. Exam conducted with a chaperone present.  Constitutional:      General: She is not in acute distress.    Appearance: Normal appearance. She is not ill-appearing.  Cardiovascular:     Rate and Rhythm: Normal rate.     Pulses: Normal pulses.  Pulmonary:     Effort: Pulmonary effort is normal.  Abdominal:     Comments: Gravid  Neurological:     Mental Status: She is alert.     MAU Course  Procedures  MDM --Reactive tracing: baseline 135, mod var, + 10 c 5m accels, no decels --Toco: quiet --Patient declines pelvic exam as well as cervical exam. Amnisure collected via blind swab --Patient unable to attend 19 week anatomy scan due to complex child care issues. Will collect OB Limited today  Patient Vitals for the past 24 hrs:  BP Temp Temp src Pulse Resp SpO2 Height Weight  11/11/22 1307 130/72 98.1 F (36.7 C) Oral 98 18 96 % -- --  11/11/22 1301 -- -- -- -- -- -- 5\' 6"  (1.676 m) 111.7 kg   Orders Placed This Encounter  Procedures   [place] MFM OB Limited   Amnisure rupture of membrane (rom)not at [facility]   Urinalysis, Routine w reflex microscopic -Urine, Clean Catch   Discharge patient   Results for orders placed or performed during the hospital encounter of 11/11/22 (from the past 24 hour(s))  Urinalysis, Routine w reflex microscopic -Urine, Clean Catch     Status: Abnormal   Collection Time: 11/11/22  1:32 PM  Result Value Ref Range   Color, Urine YELLOW YELLOW   APPearance CLEAR CLEAR   Specific Gravity, Urine 1.008 1.005 - 1.030   pH 7.0 5.0 - 8.0   Glucose, UA NEGATIVE NEGATIVE mg/dL   Hgb urine dipstick NEGATIVE NEGATIVE   Bilirubin Urine NEGATIVE  NEGATIVE   Ketones, ur NEGATIVE NEGATIVE mg/dL   Protein, ur NEGATIVE NEGATIVE mg/dL   Nitrite NEGATIVE NEGATIVE   Leukocytes,Ua SMALL (A) NEGATIVE   RBC / HPF 0-5 0 - 5 RBC/hpf   WBC, UA 0-5 0 - 5 WBC/hpf   Bacteria, UA NONE SEEN NONE SEEN  Squamous Epithelial / HPF 0-5 0 - 5 /HPF  Amnisure rupture of membrane (rom)not at [facility]     Status: None   Collection Time: 11/11/22  1:53 PM  Result Value Ref Range   Amnisure ROM NEGATIVE    Assessment and Plan  --27 y.o. [id number] at [redacted]w[redacted]d  --Reactive tracing --Intact amniotic sac --No concerning findings on MFM OB Limited US --Pain score 3/10 throughout encounter --Pelvic declined by patient --Discharge home in stable condition  Darlina Rumpf, [place], MSN, CNM 11/11/2022, 5:35 PM

## 2022-11-24 ENCOUNTER — Telehealth: Payer: Self-pay

## 2022-11-24 NOTE — Telephone Encounter (Signed)
Pt called in to reschedule her anatomy scan at [facility]. Provided pt with the number to schedule due to no show and schedule being conflicting. Reminded pt she needs to be seen by MD due to last visit in November. Pr stated she will call back to schedule that because she has her kids.

## 2022-12-07 ENCOUNTER — Telehealth: Payer: Self-pay

## 2022-12-07 NOTE — Telephone Encounter (Signed)
Spoke w/ pt regarding scheduling her future prenatal appointments. Pt stated her whole family is not feeling well and will call to schedule on a Friday when they feel better and her husband is off .

## 2022-12-08 ENCOUNTER — Encounter: Payer: Self-pay | Admitting: Family Medicine

## 2022-12-08 DIAGNOSIS — F32A Depression, unspecified: Secondary | ICD-10-CM

## 2022-12-12 MED ORDER — FLUOXETINE HCL 20 MG PO CAPS: 20.0000 mg | ORAL_CAPSULE | Freq: Every day | ORAL | 4 refills | Status: DC

## 2022-12-15 ENCOUNTER — Ambulatory Visit: Payer: Medicaid Other | Attending: Obstetrics and Gynecology | Admitting: *Deleted

## 2022-12-15 ENCOUNTER — Ambulatory Visit (HOSPITAL_BASED_OUTPATIENT_CLINIC_OR_DEPARTMENT_OTHER): Payer: Medicaid Other

## 2022-12-15 ENCOUNTER — Other Ambulatory Visit: Payer: Self-pay | Admitting: *Deleted

## 2022-12-15 VITALS — BP 114/75 | HR 115

## 2022-12-15 DIAGNOSIS — O0933 Supervision of pregnancy with insufficient antenatal care, third trimester: Secondary | ICD-10-CM | POA: Insufficient documentation

## 2022-12-15 DIAGNOSIS — O99213 Obesity complicating pregnancy, third trimester: Secondary | ICD-10-CM

## 2022-12-15 DIAGNOSIS — Z6836 Body mass index (BMI) 36.0-36.9, adult: Secondary | ICD-10-CM

## 2022-12-15 DIAGNOSIS — Z7982 Long term (current) use of aspirin: Secondary | ICD-10-CM | POA: Diagnosis not present

## 2022-12-15 DIAGNOSIS — Z3A31 31 weeks gestation of pregnancy: Secondary | ICD-10-CM | POA: Insufficient documentation

## 2022-12-15 DIAGNOSIS — D649 Anemia, unspecified: Secondary | ICD-10-CM

## 2022-12-15 DIAGNOSIS — O34219 Maternal care for unspecified type scar from previous cesarean delivery: Secondary | ICD-10-CM | POA: Diagnosis not present

## 2022-12-15 DIAGNOSIS — E282 Polycystic ovarian syndrome: Secondary | ICD-10-CM | POA: Insufficient documentation

## 2022-12-15 DIAGNOSIS — O09293 Supervision of pregnancy with other poor reproductive or obstetric history, third trimester: Secondary | ICD-10-CM | POA: Diagnosis not present

## 2022-12-15 DIAGNOSIS — O99283 Endocrine, nutritional and metabolic diseases complicating pregnancy, third trimester: Secondary | ICD-10-CM | POA: Diagnosis not present

## 2022-12-15 DIAGNOSIS — O99013 Anemia complicating pregnancy, third trimester: Secondary | ICD-10-CM | POA: Diagnosis not present

## 2022-12-15 DIAGNOSIS — E669 Obesity, unspecified: Secondary | ICD-10-CM

## 2022-12-15 DIAGNOSIS — Z8759 Personal history of other complications of pregnancy, childbirth and the puerperium: Secondary | ICD-10-CM | POA: Diagnosis not present

## 2022-12-15 DIAGNOSIS — O99210 Obesity complicating pregnancy, unspecified trimester: Secondary | ICD-10-CM

## 2022-12-15 DIAGNOSIS — Z362 Encounter for other antenatal screening follow-up: Secondary | ICD-10-CM

## 2022-12-15 DIAGNOSIS — O09299 Supervision of pregnancy with other poor reproductive or obstetric history, unspecified trimester: Secondary | ICD-10-CM

## 2022-12-15 DIAGNOSIS — Z3480 Encounter for supervision of other normal pregnancy, unspecified trimester: Secondary | ICD-10-CM

## 2022-12-15 DIAGNOSIS — Z8279 Family history of other congenital malformations, deformations and chromosomal abnormalities: Secondary | ICD-10-CM

## 2022-12-21 NOTE — Progress Notes (Signed)
   PRENATAL VISIT NOTE  Subjective:  Kimberly Farmer is a 27 y.o. [phone] at [redacted]w[redacted]d being seen today for ongoing prenatal care.  She is currently monitored for the following issues for this high-risk pregnancy and has F/H of alcoholism; BMI 36.0-36.9,adult; PCOS (polycystic ovarian syndrome); Obesity in pregnancy; History of gestational hypertension; History of cesarean delivery affecting pregnancy; Anxiety and depression; Malaise and fatigue; Migraine with aura and without status migrainosus, not intractable; Easy bruising; Chronic pain of right ankle; Left carpal tunnel syndrome; Reactive cervical lymphadenopathy; Prediabetes; IUD failure, pregnant; Supervision of other normal pregnancy, antepartum; GBS bacteriuria; and Anemia in pregnancy on their problem list.  Patient reports no complaints.  Contractions: Not present. Vag. Bleeding: None.  Movement: Present. Denies leaking of fluid.   The following portions of the patient's history were reviewed and updated as appropriate: allergies, current medications, past family history, past medical history, past social history, past surgical history and problem list.   Objective:   Vitals:   12/22/22 0849  BP: 109/70  Pulse: 98  Weight: 253 lb (114.8 kg)    Fetal Status: Fetal Heart Rate (bpm): 140 Fundal Height: 34 cm Movement: Present     General:  Alert, oriented and cooperative. Patient is in no acute distress.  Skin: Skin is warm and dry. No rash noted.   Cardiovascular: Normal heart rate noted  Respiratory: Normal respiratory effort, no problems with respiration noted  Abdomen: Soft, gravid, appropriate for gestational age.  Pain/Pressure: Present     Pelvic: Cervical exam deferred        Extremities: Normal range of motion.  Edema: Trace  Mental Status: Normal mood and affect. Normal behavior. Normal judgment and thought content.   Assessment and Plan:  Pregnancy: [id number] at [redacted]w[redacted]d 1. Anemia during pregnancy in third  trimester Improved with PO iron but no check since December.  Recheck with 28 wk labs today   2. GBS bacteriuria PCN if labor  3. History of cesarean delivery affecting pregnancy Two prior c-sections - plan is for RLTCS/BTL - Message sent to schedule her for 4/25 based on EDC of 5/2.    4. History of gestational hypertension Continue ldASA BP today wnl  5. Obesity in pregnancy  6. Prediabetes A1C was normal in beginning of pregnancy.  GTT today.   7. Supervision of other normal pregnancy, antepartum Pregnancy concieved on IUD. Plan is for xray.  Desires permanent sterilization - consent signed today  Had incomplete anatomy - has f/u scheduled now for 4/5.  Tdap offered today - she accepts.   Preterm labor symptoms and general obstetric precautions including but not limited to vaginal bleeding, contractions, leaking of fluid and fetal movement were reviewed in detail with the patient. Please refer to After Visit Summary for other counseling recommendations.   Return in about 2 weeks (around 01/05/2023) for LROB VISIT, MD or APP.  Future Appointments  Date Time Provider [place]  12/22/2022  9:35 AM Radene Gunning, MD [facility]  01/19/2023 10:15 AM [facility] NURSE [facility]  01/19/2023 10:30 AM [facility] US2 [facility]    Radene Gunning, MD

## 2022-12-22 ENCOUNTER — Ambulatory Visit (INDEPENDENT_AMBULATORY_CARE_PROVIDER_SITE_OTHER): Payer: Medicaid Other | Admitting: Obstetrics and Gynecology

## 2022-12-22 ENCOUNTER — Encounter: Payer: Self-pay | Admitting: Obstetrics and Gynecology

## 2022-12-22 ENCOUNTER — Other Ambulatory Visit: Payer: Medicaid Other

## 2022-12-22 VITALS — BP 109/70 | HR 98 | Wt 253.0 lb

## 2022-12-22 DIAGNOSIS — Z23 Encounter for immunization: Secondary | ICD-10-CM | POA: Diagnosis not present

## 2022-12-22 DIAGNOSIS — O99210 Obesity complicating pregnancy, unspecified trimester: Secondary | ICD-10-CM

## 2022-12-22 DIAGNOSIS — R8271 Bacteriuria: Secondary | ICD-10-CM

## 2022-12-22 DIAGNOSIS — R7303 Prediabetes: Secondary | ICD-10-CM

## 2022-12-22 DIAGNOSIS — O99213 Obesity complicating pregnancy, third trimester: Secondary | ICD-10-CM

## 2022-12-22 DIAGNOSIS — O99013 Anemia complicating pregnancy, third trimester: Secondary | ICD-10-CM

## 2022-12-22 DIAGNOSIS — Z8759 Personal history of other complications of pregnancy, childbirth and the puerperium: Secondary | ICD-10-CM

## 2022-12-22 DIAGNOSIS — Z3A32 32 weeks gestation of pregnancy: Secondary | ICD-10-CM

## 2022-12-22 DIAGNOSIS — Z3480 Encounter for supervision of other normal pregnancy, unspecified trimester: Secondary | ICD-10-CM

## 2022-12-22 DIAGNOSIS — Z331 Pregnant state, incidental: Secondary | ICD-10-CM

## 2022-12-22 DIAGNOSIS — O34219 Maternal care for unspecified type scar from previous cesarean delivery: Secondary | ICD-10-CM

## 2022-12-22 NOTE — Progress Notes (Signed)
CC: Denies any concerns

## 2022-12-22 NOTE — Addendum Note (Signed)
Addended by: Laurina Bustle on: 12/22/2022 09:10 AM   Modules accepted: Orders

## 2022-12-23 LAB — CBC
Hematocrit: 30.8 % — ABNORMAL LOW (ref 34.0–46.6)
Hemoglobin: 9.3 g/dL — ABNORMAL LOW (ref 11.1–15.9)
MCH: 21.7 pg — ABNORMAL LOW (ref 26.6–33.0)
MCHC: 30.2 g/dL — ABNORMAL LOW (ref 31.5–35.7)
MCV: 72 fL — ABNORMAL LOW (ref 79–97)
Platelets: 325 10*3/uL (ref 150–450)
RBC: 4.28 x10E6/uL (ref 3.77–5.28)
RDW: 14.7 % (ref 11.7–15.4)
WBC: 9.5 10*3/uL (ref 3.4–10.8)

## 2022-12-23 LAB — HIV ANTIBODY (ROUTINE TESTING W REFLEX): HIV Screen 4th Generation wRfx: NONREACTIVE

## 2022-12-23 LAB — RPR: RPR Ser Ql: NONREACTIVE

## 2022-12-23 LAB — GLUCOSE TOLERANCE, 1 HOUR: Glucose, 1Hr PP: 115 mg/dL (ref 70–199)

## 2022-12-25 ENCOUNTER — Telehealth: Payer: Self-pay | Admitting: Pharmacy Technician

## 2022-12-25 ENCOUNTER — Other Ambulatory Visit: Payer: Self-pay | Admitting: Obstetrics and Gynecology

## 2022-12-25 NOTE — Telephone Encounter (Signed)
Dr. Damita Dunnings, Juluis Rainier note:  Auth Submission: NO AUTH NEEDED Payer: [employer] - [place] MEDICAID Medication & CPT/J Code(s) submitted: Venofer (Iron Sucrose) J1756 Route of submission (phone, fax, portal):  Phone # Fax # Auth type: Buy/Bill Units/visits requested: 5 Reference number:  Approval from: 12/25/22 to 04/26/23   Patient will be scheduled as soon as possible

## 2022-12-29 ENCOUNTER — Ambulatory Visit (INDEPENDENT_AMBULATORY_CARE_PROVIDER_SITE_OTHER): Payer: Medicaid Other | Admitting: *Deleted

## 2022-12-29 VITALS — BP 107/67 | HR 83 | Temp 98.0°F | Resp 16 | Ht 66.0 in | Wt 252.0 lb

## 2022-12-29 DIAGNOSIS — Z3480 Encounter for supervision of other normal pregnancy, unspecified trimester: Secondary | ICD-10-CM

## 2022-12-29 DIAGNOSIS — Z3A33 33 weeks gestation of pregnancy: Secondary | ICD-10-CM | POA: Diagnosis not present

## 2022-12-29 DIAGNOSIS — O99013 Anemia complicating pregnancy, third trimester: Secondary | ICD-10-CM

## 2022-12-29 DIAGNOSIS — O34219 Maternal care for unspecified type scar from previous cesarean delivery: Secondary | ICD-10-CM

## 2022-12-29 MED ORDER — SODIUM CHLORIDE 0.9 % IV SOLN
200.0000 mg | Freq: Once | INTRAVENOUS | Status: AC
  Administered 2022-12-29: 200 mg via INTRAVENOUS
  Filled 2022-12-29: qty 10

## 2022-12-29 MED ORDER — ACETAMINOPHEN 325 MG PO TABS
650.0000 mg | ORAL_TABLET | Freq: Once | ORAL | Status: AC
  Administered 2022-12-29: 650 mg via ORAL
  Filled 2022-12-29: qty 2

## 2022-12-29 MED ORDER — DIPHENHYDRAMINE HCL 25 MG PO CAPS
25.0000 mg | ORAL_CAPSULE | Freq: Once | ORAL | Status: AC
  Administered 2022-12-29: 25 mg via ORAL
  Filled 2022-12-29: qty 1

## 2022-12-29 NOTE — Progress Notes (Signed)
Diagnosis: Iron Deficiency Anemia  Provider:  Marshell Garfinkel MD  Procedure: Infusion  IV Type: Peripheral, IV Location: R Antecubital  Venofer (Iron Sucrose), Dose: 200 mg  Infusion Start Time: [id number]  Infusion Stop Time: [id number]  Post Infusion IV Care: Observation period completed and Peripheral IV Discontinued  Discharge: Condition: Good, Destination: Home . AVS Provided  Performed by:  Baxter Hire, RN

## 2023-01-01 ENCOUNTER — Inpatient Hospital Stay (HOSPITAL_COMMUNITY)
Admission: AD | Admit: 2023-01-01 | Discharge: 2023-01-01 | Disposition: A | Discharge status: Home / Self Care | Payer: Medicaid Other | Attending: Obstetrics & Gynecology | Admitting: Obstetrics & Gynecology

## 2023-01-01 ENCOUNTER — Ambulatory Visit (INDEPENDENT_AMBULATORY_CARE_PROVIDER_SITE_OTHER): Payer: Medicaid Other

## 2023-01-01 ENCOUNTER — Encounter (HOSPITAL_COMMUNITY): Payer: Self-pay | Admitting: Obstetrics & Gynecology

## 2023-01-01 VITALS — BP 129/69 | HR 97 | Temp 97.8°F | Resp 18 | Ht 66.0 in | Wt 251.6 lb

## 2023-01-01 DIAGNOSIS — Z3480 Encounter for supervision of other normal pregnancy, unspecified trimester: Secondary | ICD-10-CM

## 2023-01-01 DIAGNOSIS — D649 Anemia, unspecified: Secondary | ICD-10-CM | POA: Diagnosis not present

## 2023-01-01 DIAGNOSIS — R1032 Left lower quadrant pain: Secondary | ICD-10-CM | POA: Diagnosis present

## 2023-01-01 DIAGNOSIS — O99013 Anemia complicating pregnancy, third trimester: Secondary | ICD-10-CM

## 2023-01-01 DIAGNOSIS — R102 Pelvic and perineal pain: Secondary | ICD-10-CM | POA: Insufficient documentation

## 2023-01-01 DIAGNOSIS — O26893 Other specified pregnancy related conditions, third trimester: Secondary | ICD-10-CM | POA: Diagnosis present

## 2023-01-01 DIAGNOSIS — O09293 Supervision of pregnancy with other poor reproductive or obstetric history, third trimester: Secondary | ICD-10-CM | POA: Insufficient documentation

## 2023-01-01 DIAGNOSIS — O34219 Maternal care for unspecified type scar from previous cesarean delivery: Secondary | ICD-10-CM

## 2023-01-01 DIAGNOSIS — R059 Cough, unspecified: Secondary | ICD-10-CM | POA: Insufficient documentation

## 2023-01-01 DIAGNOSIS — N949 Unspecified condition associated with female genital organs and menstrual cycle: Secondary | ICD-10-CM

## 2023-01-01 DIAGNOSIS — Z3A33 33 weeks gestation of pregnancy: Secondary | ICD-10-CM | POA: Diagnosis not present

## 2023-01-01 LAB — URINALYSIS, ROUTINE W REFLEX MICROSCOPIC
Bacteria, UA: NONE SEEN
Bilirubin Urine: NEGATIVE
Glucose, UA: NEGATIVE mg/dL
Hgb urine dipstick: NEGATIVE
Ketones, ur: NEGATIVE mg/dL
Nitrite: NEGATIVE
Protein, ur: NEGATIVE mg/dL
Specific Gravity, Urine: 1.005 (ref 1.005–1.030)
pH: 7 (ref 5.0–8.0)

## 2023-01-01 MED ORDER — SODIUM CHLORIDE 0.9 % IV SOLN
200.0000 mg | Freq: Once | INTRAVENOUS | Status: AC
  Administered 2023-01-01: 200 mg via INTRAVENOUS
  Filled 2023-01-01: qty 10

## 2023-01-01 MED ORDER — GUAIFENESIN 100 MG/5ML PO LIQD: 100.0000 mg | ORAL | 0 refills | Status: DC | PRN

## 2023-01-01 MED ORDER — DIPHENHYDRAMINE HCL 25 MG PO CAPS: 25.0000 mg | ORAL_CAPSULE | Freq: Once | ORAL | Status: DC

## 2023-01-01 MED ORDER — CYCLOBENZAPRINE HCL 10 MG PO TABS: 10.0000 mg | ORAL_TABLET | Freq: Two times a day (BID) | ORAL | 0 refills | Status: DC | PRN

## 2023-01-01 MED ORDER — ACETAMINOPHEN 325 MG PO TABS: 650.0000 mg | ORAL_TABLET | Freq: Once | ORAL | Status: DC

## 2023-01-01 MED ORDER — GUAIFENESIN 100 MG/5ML PO LIQD
5.0000 mL | Freq: Once | ORAL | Status: AC
  Administered 2023-01-01: 5 mL via ORAL
  Filled 2023-01-01: qty 15

## 2023-01-01 MED ORDER — ACETAMINOPHEN 500 MG PO TABS
1000.0000 mg | ORAL_TABLET | Freq: Once | ORAL | Status: AC
  Administered 2023-01-01: 1000 mg via ORAL
  Filled 2023-01-01: qty 2

## 2023-01-01 NOTE — Discharge Instructions (Signed)

## 2023-01-01 NOTE — MAU Provider Note (Signed)
History     CSN: [id number]  Arrival date and time: 01/01/23 1948   None     Chief Complaint  Patient presents with   Cough   Hip Pain   HPI Kimberly Farmer is a 27 y.o. [id number] at [redacted]w[redacted]d who presents to MAU for abdominal pain. She reports that both she and her children have been sick with a cough. She reports last night that cough was really bad and then she woke up this morning with a dull pain in left side of abdomen which has progressively worsened throughout the day. She reports that pain is somewhat always there and is a dull sensation, however when she coughs, stands up, or sits down is when the pain is aggravated. She took Ibuprofen earlier today because she was told to prior to her iron infusion. Applying pressure to the area helps relieve pain. She denies urinary s/s, vaginal bleeding, leaking fluid, or fever. She is having normal bowel movements. She reports fetal movement.   Pregnancy course: Previous c/s x2, anemia, hx gHTN. [facility], next appointment is on 3/21  OB History     Gravida  5   Para  3   Term  3   Preterm  0   AB  1   Living  3      SAB  1   IAB  0   Ectopic  0   Multiple  0   Live Births  3           Past Medical History:  Diagnosis Date   Arthritis    Bilateral knees and back   Breakthrough bleeding on birth control pills 05/16/2018   Cesarean wound infection 03/18/2019   Choroid plexus cysts, fetal, affecting care of mother, antepartum 09/17/2018   Gestational hypertension 03/01/2021   Lumbar disc disease    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   PCOS (polycystic ovarian syndrome)    Post-dates pregnancy 02/21/2019   Pregnancy induced hypertension    Tick bite of left breast 02/24/2022   Yellow eyes 02/08/2022    Past Surgical History:  Procedure Laterality Date   CESAREAN SECTION N/A 02/21/2019   Procedure: CESAREAN SECTION;  Surgeon: Gae Dry, MD;  Location: [facility] ORS;  Service: Obstetrics;  Laterality:  N/A;   CESAREAN SECTION N/A 03/02/2021   Procedure: CESAREAN SECTION;  Surgeon: Osborne Oman, MD;  Location: [facility] ORS;  Service: Obstetrics;  Laterality: N/A;   KNEE SURGERY Left 2011   [name] has had 5 surgeries on her knee   TONSILLECTOMY AND ADENOIDECTOMY  2007    Family History  Problem Relation Age of Onset   Hypertension Mother    Alcohol abuse Father    Diabetes Maternal Grandmother    Hypertension Maternal Grandmother    Cancer Paternal Grandfather        skin    Social History   Tobacco Use   Smoking status: Never   Smokeless tobacco: Never   Tobacco comments:    Vape - Reduced Nicotine  Vaping Use   Vaping Use: Former   Substances: Nicotine  Substance Use Topics   Alcohol use: No   Drug use: No    Allergies:  Allergies  Allergen Reactions   Hydrocodone Other (See Comments)    Other Reaction: SEVERE HA & GI Upset   Oxycodone Other (See Comments)    Other Reaction: SEVERE HA & GI Upset   Tape Rash    No medications prior to admission.  Review of Systems  Respiratory:  Positive for cough.   Gastrointestinal:  Positive for abdominal pain.  All other systems reviewed and are negative.  Physical Exam  Patient Vitals for the past 24 hrs:  BP Temp Temp src Pulse Resp SpO2 Height Weight  01/01/23 2009 138/64 98.8 F (37.1 C) Oral 100 18 98 % 5\' 6"  (1.676 m) 114.3 kg   Physical Exam Vitals and nursing note reviewed.  Constitutional:      Appearance: She is obese.  Eyes:     Extraocular Movements: Extraocular movements intact.     Pupils: Pupils are equal, round, and reactive to light.  Cardiovascular:     Rate and Rhythm: Normal rate.  Pulmonary:     Effort: Pulmonary effort is normal. No respiratory distress.  Abdominal:     Palpations: Abdomen is soft.     Tenderness: There is no abdominal tenderness.     Comments: Gravid    Genitourinary:    Comments: Declines cervical exam Musculoskeletal:        General: Normal range of motion.      Cervical back: Normal range of motion.  Skin:    General: Skin is warm and dry.  Neurological:     General: No focal deficit present.     Mental Status: She is alert and oriented to person, place, and time.  Psychiatric:        Mood and Affect: Mood normal.        Behavior: Behavior normal.    NST FHR: 120 bpm, moderate variability, +15x15 accels, no decels Toco: quiet  Results for orders placed or performed during the hospital encounter of 01/01/23 (from the past 24 hour(s))  Urinalysis, Routine w reflex microscopic -Urine, Clean Catch     Status: Abnormal   Collection Time: 01/01/23  9:30 PM  Result Value Ref Range   Color, Urine STRAW (A) YELLOW   APPearance CLEAR CLEAR   Specific Gravity, Urine 1.005 1.005 - 1.030   pH 7.0 5.0 - 8.0   Glucose, UA NEGATIVE NEGATIVE mg/dL   Hgb urine dipstick NEGATIVE NEGATIVE   Bilirubin Urine NEGATIVE NEGATIVE   Ketones, ur NEGATIVE NEGATIVE mg/dL   Protein, ur NEGATIVE NEGATIVE mg/dL   Nitrite NEGATIVE NEGATIVE   Leukocytes,Ua LARGE (A) NEGATIVE   RBC / HPF 0-5 0 - 5 RBC/hpf   WBC, UA 0-5 0 - 5 WBC/hpf   Bacteria, UA NONE SEEN NONE SEEN   Squamous Epithelial / HPF 0-5 0 - 5 /HPF   Mucus PRESENT    MAU Course  Procedures  MDM [place] Tylenol, Robitussin  NST  UA negative. NST reactive and reassuring. Toco quiet. Patient offered Tylenol, Flexeril, Robitussin, and heating pad. She declines Flexeril and heating pad. She declines cervical exam. Patient requesting discharge home soon after Tylenol and Robitussin given. I suspect round ligament pain/MSK pain. Low suspicion for PTL, pyelonephritis, or kidney stone.   Assessment and Plan   1. [redacted] weeks gestation of pregnancy   2. Round ligament pain    - Discharge home in stable condition - Rx for Flexeril and Robitussin sent - Return to MAU for new/worsening symptoms - Keep OB appointment as scheduled on 3/21  Renee Harder, CNM 01/01/2023, 10:54 PM

## 2023-01-01 NOTE — Progress Notes (Signed)
Diagnosis: Iron Deficiency Anemia  Provider:  Marshell Garfinkel MD  Procedure: Infusion  IV Type: Peripheral, IV Location: R Antecubital  Venofer (Iron Sucrose), Dose: 200 mg  Infusion Start Time: 1000  Infusion Stop Time: [id number]  Post Infusion IV Care: Peripheral IV Discontinued  Discharge: Condition: Good, Destination: Home . AVS Declined  Performed by:  Cleophus Molt, RN

## 2023-01-01 NOTE — MAU Note (Addendum)
Pt says she has been coughing/ runny nose/ Headache  since Thurs-  she took 400mg  Ibuprofen daily . Last time today- 0900.  (Says other 3 children have been coughing . She did video call with Family practice - told allergies or virus . 27  year old is well ) BC of coughing - she has left hip pain - started this am - she called [facility]- told to be seen within 24 hrs. She has an appointment on 01-04-2023

## 2023-01-04 ENCOUNTER — Telehealth: Payer: Self-pay

## 2023-01-04 ENCOUNTER — Encounter: Payer: Medicaid Other | Admitting: Advanced Practice Midwife

## 2023-01-04 NOTE — Telephone Encounter (Signed)
Called pt to see if she needed to switch her appt to mychart. Pt does not have vm.

## 2023-01-05 ENCOUNTER — Ambulatory Visit: Payer: Medicaid Other

## 2023-01-05 MED ORDER — ACETAMINOPHEN 325 MG PO TABS: 650.0000 mg | ORAL_TABLET | Freq: Once | ORAL | Status: DC

## 2023-01-05 MED ORDER — SODIUM CHLORIDE 0.9 % IV SOLN
200.0000 mg | Freq: Once | INTRAVENOUS | Status: DC
  Filled 2023-01-05: qty 10

## 2023-01-05 MED ORDER — DIPHENHYDRAMINE HCL 25 MG PO CAPS: 25.0000 mg | ORAL_CAPSULE | Freq: Once | ORAL | Status: DC

## 2023-01-08 ENCOUNTER — Ambulatory Visit (INDEPENDENT_AMBULATORY_CARE_PROVIDER_SITE_OTHER): Payer: Medicaid Other

## 2023-01-08 VITALS — BP 136/78 | HR 116 | Temp 97.5°F | Resp 18 | Ht 66.0 in | Wt 254.0 lb

## 2023-01-08 DIAGNOSIS — Z3A34 34 weeks gestation of pregnancy: Secondary | ICD-10-CM

## 2023-01-08 DIAGNOSIS — O99013 Anemia complicating pregnancy, third trimester: Secondary | ICD-10-CM

## 2023-01-08 DIAGNOSIS — Z3480 Encounter for supervision of other normal pregnancy, unspecified trimester: Secondary | ICD-10-CM

## 2023-01-08 DIAGNOSIS — O34219 Maternal care for unspecified type scar from previous cesarean delivery: Secondary | ICD-10-CM

## 2023-01-08 MED ORDER — DIPHENHYDRAMINE HCL 25 MG PO CAPS: 25.0000 mg | ORAL_CAPSULE | Freq: Once | ORAL | Status: DC

## 2023-01-08 MED ORDER — ACETAMINOPHEN 325 MG PO TABS: 650.0000 mg | ORAL_TABLET | Freq: Once | ORAL | Status: DC

## 2023-01-08 MED ORDER — SODIUM CHLORIDE 0.9 % IV SOLN
200.0000 mg | Freq: Once | INTRAVENOUS | Status: AC
  Administered 2023-01-08: 200 mg via INTRAVENOUS
  Filled 2023-01-08: qty 10

## 2023-01-08 NOTE — Progress Notes (Signed)
Diagnosis: Acute Anemia  Provider:  Marshell Garfinkel MD  Procedure: Infusion  IV Type: Peripheral, IV Location: R Antecubital  Venofer (Iron Sucrose), Dose: 200 mg  Infusion Start Time: [id number]  Infusion Stop Time: 0958  Post Infusion IV Care: Peripheral IV Discontinued  Discharge: Condition: Good, Destination: Home . AVS Provided  Performed by:  Paul Dykes, RN

## 2023-01-09 ENCOUNTER — Inpatient Hospital Stay (HOSPITAL_COMMUNITY)
Admission: AD | Admit: 2023-01-09 | Discharge: 2023-01-09 | Disposition: A | Discharge status: Home / Self Care | Payer: Medicaid Other | Attending: Obstetrics & Gynecology | Admitting: Obstetrics & Gynecology

## 2023-01-09 ENCOUNTER — Encounter (HOSPITAL_COMMUNITY): Payer: Self-pay | Admitting: Obstetrics & Gynecology

## 2023-01-09 DIAGNOSIS — O26853 Spotting complicating pregnancy, third trimester: Secondary | ICD-10-CM | POA: Diagnosis present

## 2023-01-09 DIAGNOSIS — O98813 Other maternal infectious and parasitic diseases complicating pregnancy, third trimester: Secondary | ICD-10-CM | POA: Insufficient documentation

## 2023-01-09 DIAGNOSIS — O34219 Maternal care for unspecified type scar from previous cesarean delivery: Secondary | ICD-10-CM | POA: Diagnosis not present

## 2023-01-09 DIAGNOSIS — B9689 Other specified bacterial agents as the cause of diseases classified elsewhere: Secondary | ICD-10-CM | POA: Insufficient documentation

## 2023-01-09 DIAGNOSIS — Z3A34 34 weeks gestation of pregnancy: Secondary | ICD-10-CM | POA: Diagnosis not present

## 2023-01-09 DIAGNOSIS — O09293 Supervision of pregnancy with other poor reproductive or obstetric history, third trimester: Secondary | ICD-10-CM | POA: Diagnosis not present

## 2023-01-09 DIAGNOSIS — O23593 Infection of other part of genital tract in pregnancy, third trimester: Secondary | ICD-10-CM | POA: Diagnosis not present

## 2023-01-09 DIAGNOSIS — B3731 Acute candidiasis of vulva and vagina: Secondary | ICD-10-CM | POA: Diagnosis not present

## 2023-01-09 LAB — WET PREP, GENITAL
Clue Cells Wet Prep HPF POC: NONE SEEN
Sperm: NONE SEEN
Trich, Wet Prep: NONE SEEN
WBC, Wet Prep HPF POC: >=10 — AB (ref <10)
Yeast Wet Prep HPF POC: NONE SEEN

## 2023-01-09 LAB — URINALYSIS, ROUTINE W REFLEX MICROSCOPIC
Bilirubin Urine: NEGATIVE
Glucose, UA: NEGATIVE mg/dL
Ketones, ur: NEGATIVE mg/dL
Nitrite: NEGATIVE
Protein, ur: NEGATIVE mg/dL
Specific Gravity, Urine: 1.01 (ref 1.005–1.030)
pH: 7 (ref 5.0–8.0)

## 2023-01-09 MED ORDER — TERCONAZOLE 0.4 % VA CREA: 1.0000 | TOPICAL_CREAM | Freq: Every day | VAGINAL | 0 refills | Status: DC

## 2023-01-09 NOTE — MAU Note (Signed)
.  Kimberly Farmer is a 27 y.o. at [redacted]w[redacted]d here in MAU reporting: when she woke up she had pink when she wiped and then "9 or 10 blood droplets". States the last time she was blood was around 1130, but she did not have anyone to watch her children so she came in now to make sure everything was okay. Denies pain or LOF. Also reports DFM today. Denies HA or visual disturbances, hx GHTN.   Pain score: 0 Vitals:   01/09/23 1326  BP: (!) 156/69  Pulse: (!) 115  Resp: 16  Temp: 98.4 F (36.9 C)  SpO2: 98%     FHT:154 Lab orders placed from triage:  UA

## 2023-01-09 NOTE — MAU Provider Note (Signed)
History     CSN: ID:[id number]  Arrival date and time: 01/09/23 1315   Event Date/Time   First Provider Initiated Contact with Patient 01/09/23 1403      Chief Complaint  Patient presents with   Vaginal Bleeding   HPI Kimberly Farmer is a 27 y.o. [id number] at [redacted]w[redacted]d who presents to MAU for vaginal spotting. She reports she went to the bathroom this morning and had some light pink spotting with wiping. She has not had any more since 1130 but due to child care, was not able to come in until now. She denies pain, heavy vaginal bleeding, or leaking fluid. She endorses normal fetal movement. No headaches, vision changes, or RUQ/epigastric pain.   Pregnancy course: hx gHTN, previous c/s x2. Patient receives [facility] at [facility], next appointment is on 3/28.   OB History     Gravida  5   Para  3   Term  3   Preterm  0   AB  1   Living  3      SAB  1   IAB  0   Ectopic  0   Multiple  0   Live Births  3           Past Medical History:  Diagnosis Date   Arthritis    Bilateral knees and back   Breakthrough bleeding on birth control pills 05/16/2018   Cesarean wound infection 03/18/2019   Choroid plexus cysts, fetal, affecting care of mother, antepartum 09/17/2018   Gestational hypertension 03/01/2021   Lumbar disc disease    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   PCOS (polycystic ovarian syndrome)    Post-dates pregnancy 02/21/2019   Pregnancy induced hypertension    Tick bite of left breast 02/24/2022   Yellow eyes 02/08/2022    Past Surgical History:  Procedure Laterality Date   CESAREAN SECTION N/A 02/21/2019   Procedure: CESAREAN SECTION;  Surgeon: Gae Dry, MD;  Location: [facility] ORS;  Service: Obstetrics;  Laterality: N/A;   CESAREAN SECTION N/A 03/02/2021   Procedure: CESAREAN SECTION;  Surgeon: Osborne Oman, MD;  Location: [facility] ORS;  Service: Obstetrics;  Laterality: N/A;   KNEE SURGERY Left 2011   [name] has had 5 surgeries on her knee   TONSILLECTOMY AND  ADENOIDECTOMY  2007    Family History  Problem Relation Age of Onset   Hypertension Mother    Alcohol abuse Father    Diabetes Maternal Grandmother    Hypertension Maternal Grandmother    Cancer Paternal Grandfather        skin    Social History   Tobacco Use   Smoking status: Never   Smokeless tobacco: Never   Tobacco comments:    Vape - Reduced Nicotine  Vaping Use   Vaping Use: Former   Substances: Nicotine  Substance Use Topics   Alcohol use: No   Drug use: No    Allergies:  Allergies  Allergen Reactions   Hydrocodone Other (See Comments)    Other Reaction: SEVERE HA & GI Upset   Oxycodone Other (See Comments)    Other Reaction: SEVERE HA & GI Upset   Tape Rash    No medications prior to admission.   Review of Systems  Constitutional: Negative.   Gastrointestinal: Negative.   Genitourinary:  Positive for vaginal bleeding (spotting).  All other systems reviewed and are negative.  Physical Exam  Patient Vitals for the past 24 hrs:  BP Temp Temp src Pulse Resp SpO2 Weight  01/09/23 1537 121/74 -- -- (!) 106 -- -- --  01/09/23 1416 125/72 -- -- 93 -- -- --  01/09/23 1400 130/78 -- -- (!) 104 -- 97 % --  01/09/23 1348 135/79 -- -- (!) 104 -- -- --  01/09/23 1326 (!) 156/69 98.4 F (36.9 C) Oral (!) 115 16 98 % 116.4 kg   Physical Exam Vitals and nursing note reviewed. Exam conducted with a chaperone present.  Constitutional:      General: She is not in acute distress. Eyes:     Extraocular Movements: Extraocular movements intact.     Pupils: Pupils are equal, round, and reactive to light.  Cardiovascular:     Rate and Rhythm: Tachycardia present.  Pulmonary:     Effort: Pulmonary effort is normal. No respiratory distress.  Abdominal:     Palpations: Abdomen is soft.     Tenderness: There is no abdominal tenderness.     Comments: Gravid    Genitourinary:    Comments: Normal external female genitalia, vaginal walls pink and rugated, small amount  of thick yellow green curdy discharge coating vaginal walls c/w yeast, no bleeding, no pooling of amniotic fluid, cervix visually closed without lesions/masses Musculoskeletal:        General: Normal range of motion.     Cervical back: Normal range of motion.  Skin:    General: Skin is warm and dry.  Neurological:     General: No focal deficit present.     Mental Status: She is alert and oriented to person, place, and time.  Psychiatric:        Mood and Affect: Mood normal.        Behavior: Behavior normal.   Dilation: Closed Effacement (%): Thick Presentation: Undeterminable Exam by:: Maryagnes Amos, CNM  NST FHR: 135 bpm, moderate variability, +15x15 accels, no decels Toco: quiet  Results for orders placed or performed during the hospital encounter of 01/09/23 (from the past 24 hour(s))  Urinalysis, Routine w reflex microscopic -Urine, Clean Catch     Status: Abnormal   Collection Time: 01/09/23  1:32 PM  Result Value Ref Range   Color, Urine YELLOW YELLOW   APPearance HAZY (A) CLEAR   Specific Gravity, Urine 1.010 1.005 - 1.030   pH 7.0 5.0 - 8.0   Glucose, UA NEGATIVE NEGATIVE mg/dL   Hgb urine dipstick SMALL (A) NEGATIVE   Bilirubin Urine NEGATIVE NEGATIVE   Ketones, ur NEGATIVE NEGATIVE mg/dL   Protein, ur NEGATIVE NEGATIVE mg/dL   Nitrite NEGATIVE NEGATIVE   Leukocytes,Ua LARGE (A) NEGATIVE   RBC / HPF 0-5 0 - 5 RBC/hpf   WBC, UA 6-10 0 - 5 WBC/hpf   Bacteria, UA RARE (A) NONE SEEN   Squamous Epithelial / HPF 6-10 0 - 5 /HPF   Mucus PRESENT   Wet prep, genital     Status: Abnormal   Collection Time: 01/09/23  2:11 PM  Result Value Ref Range   Yeast Wet Prep HPF POC NONE SEEN NONE SEEN   Trich, Wet Prep NONE SEEN NONE SEEN   Clue Cells Wet Prep HPF POC NONE SEEN NONE SEEN   WBC, Wet Prep HPF POC >=10 (A) <10   Sperm NONE SEEN    MAU Course  Procedures  MDM UA Wet prep, GC/CT NST  Speculum exam without any evidence of bleeding. Exam c/w yeast  infection. Cervix closed. NST reactive and reassuring. Toco quiet.  Although wet prep negative, will treat for yeast given physical exam. Likely the  source of spotting.  Single elevated BP on arrival, subsequent BPs normotensive. Patient asymptomatic. PEC labs deferred at this time as I have a low suspicion for PEC.   Assessment and Plan   1. [redacted] weeks gestation of pregnancy   2. Yeast vaginitis    - Discharge home in stable condition - Terazol sent - Return precautions given. Return to MAU as needed - Keep OB appointment as scheduled.   Renee Harder, CNM 01/09/2023, 5:32 PM

## 2023-01-10 LAB — GC/CHLAMYDIA PROBE AMP (~~LOC~~) NOT AT ARMC
Chlamydia: NEGATIVE
Comment: NEGATIVE
Comment: NORMAL
Neisseria Gonorrhea: NEGATIVE

## 2023-01-11 ENCOUNTER — Encounter: Payer: Medicaid Other | Admitting: Obstetrics and Gynecology

## 2023-01-11 ENCOUNTER — Encounter: Payer: Self-pay | Admitting: Obstetrics and Gynecology

## 2023-01-11 LAB — CULTURE, OB URINE: Culture: 2000 — AB

## 2023-01-12 ENCOUNTER — Ambulatory Visit: Payer: Medicaid Other

## 2023-01-12 MED ORDER — ACETAMINOPHEN 325 MG PO TABS: 650.0000 mg | ORAL_TABLET | Freq: Once | ORAL | Status: DC

## 2023-01-12 MED ORDER — DIPHENHYDRAMINE HCL 25 MG PO CAPS: 25.0000 mg | ORAL_CAPSULE | Freq: Once | ORAL | Status: DC

## 2023-01-12 MED ORDER — SODIUM CHLORIDE 0.9 % IV SOLN
200.0000 mg | Freq: Once | INTRAVENOUS | Status: DC
  Filled 2023-01-12: qty 10

## 2023-01-12 NOTE — Progress Notes (Signed)
Patient did not keep her OB appointment for 01/11/2023.  Durene Romans MD Attending Center for [employer] farm manager)

## 2023-01-19 ENCOUNTER — Ambulatory Visit: Payer: Medicaid Other | Admitting: *Deleted

## 2023-01-19 ENCOUNTER — Ambulatory Visit: Payer: Medicaid Other | Attending: Maternal & Fetal Medicine

## 2023-01-19 VITALS — BP 135/75 | HR 100

## 2023-01-19 DIAGNOSIS — O09299 Supervision of pregnancy with other poor reproductive or obstetric history, unspecified trimester: Secondary | ICD-10-CM

## 2023-01-19 DIAGNOSIS — E669 Obesity, unspecified: Secondary | ICD-10-CM

## 2023-01-19 DIAGNOSIS — O99213 Obesity complicating pregnancy, third trimester: Secondary | ICD-10-CM | POA: Diagnosis present

## 2023-01-19 DIAGNOSIS — O09293 Supervision of pregnancy with other poor reproductive or obstetric history, third trimester: Secondary | ICD-10-CM

## 2023-01-19 DIAGNOSIS — O34219 Maternal care for unspecified type scar from previous cesarean delivery: Secondary | ICD-10-CM

## 2023-01-19 DIAGNOSIS — Z3A36 36 weeks gestation of pregnancy: Secondary | ICD-10-CM

## 2023-01-19 DIAGNOSIS — O99013 Anemia complicating pregnancy, third trimester: Secondary | ICD-10-CM

## 2023-01-19 DIAGNOSIS — Z8279 Family history of other congenital malformations, deformations and chromosomal abnormalities: Secondary | ICD-10-CM

## 2023-01-19 DIAGNOSIS — D649 Anemia, unspecified: Secondary | ICD-10-CM

## 2023-01-19 DIAGNOSIS — Z362 Encounter for other antenatal screening follow-up: Secondary | ICD-10-CM

## 2023-01-19 DIAGNOSIS — O0933 Supervision of pregnancy with insufficient antenatal care, third trimester: Secondary | ICD-10-CM | POA: Diagnosis not present

## 2023-01-23 ENCOUNTER — Ambulatory Visit (INDEPENDENT_AMBULATORY_CARE_PROVIDER_SITE_OTHER): Payer: Medicaid Other | Admitting: Obstetrics & Gynecology

## 2023-01-23 VITALS — BP 122/84 | HR 106 | Wt 258.0 lb

## 2023-01-23 DIAGNOSIS — Z3A36 36 weeks gestation of pregnancy: Secondary | ICD-10-CM

## 2023-01-23 DIAGNOSIS — O163 Unspecified maternal hypertension, third trimester: Secondary | ICD-10-CM

## 2023-01-23 DIAGNOSIS — O0993 Supervision of high risk pregnancy, unspecified, third trimester: Secondary | ICD-10-CM

## 2023-01-23 DIAGNOSIS — Z8759 Personal history of other complications of pregnancy, childbirth and the puerperium: Secondary | ICD-10-CM

## 2023-01-23 DIAGNOSIS — O99013 Anemia complicating pregnancy, third trimester: Secondary | ICD-10-CM

## 2023-01-23 DIAGNOSIS — O34219 Maternal care for unspecified type scar from previous cesarean delivery: Secondary | ICD-10-CM

## 2023-01-23 MED ORDER — FERRIC MALTOL 30 MG PO CAPS: 1.0000 | ORAL_CAPSULE | Freq: Two times a day (BID) | ORAL | 2 refills | Status: DC

## 2023-01-23 NOTE — Progress Notes (Signed)
ROB   CC: HA's and swelling

## 2023-01-23 NOTE — Patient Instructions (Signed)
Return to office for any scheduled appointments. Call the office or go to the MAU at [facility] at  if: You begin to have strong, frequent contractions Your water breaks.  Sometimes it is a big gush of fluid, sometimes it is just a trickle that keeps getting your underwear wet or running down your legs You have vaginal bleeding.  It is normal to have a small amount of spotting if your cervix was checked.  You do not feel your baby moving like normal.  If you do not, get something to eat and drink and lay down and focus on feeling your baby move.   If your baby is still not moving like normal, you should call the office or go to MAU. Any other obstetric concerns.  

## 2023-01-23 NOTE — Progress Notes (Signed)
PRENATAL VISIT NOTE  Subjective:  Kimberly Farmer is a 27 y.o. [phone] at [redacted]w[redacted]d being seen today for ongoing prenatal care. Here with her three young sons, working hard to keep them under control.  She is currently monitored for the following issues for this high-risk pregnancy and has F/H of alcoholism; BMI 36.0-36.9,adult; PCOS (polycystic ovarian syndrome); Obesity in pregnancy; History of gestational hypertension; History of cesarean delivery affecting pregnancy; Anxiety and depression; Malaise and fatigue; Migraine with aura and without status migrainosus, not intractable; Easy bruising; Chronic pain of right ankle; Left carpal tunnel syndrome; Reactive cervical lymphadenopathy; Prediabetes; IUD failure, pregnant; Supervision of high-risk pregnancy; GBS bacteriuria; and Anemia in pregnancy on their problem list.  Patient reports headache.  Mild, only took 325 mg of Tylenol.  Patient denies any visual symptoms, RUQ/epigastric pain or other concerning symptoms.   Contractions: Not present. Vag. Bleeding: None.  Movement: Present. Denies leaking of fluid.   The following portions of the patient's history were reviewed and updated as appropriate: allergies, current medications, past family history, past medical history, past social history, past surgical history and problem list.   Objective:   Vitals:   01/23/23 1520 01/23/23 1535 01/23/23 1538  BP: (!) 133/91 122/84 122/84  Pulse: (!) 123 (!) 106 (!) 106  Weight: 258 lb (117 kg)      Fetal Status: Fetal Heart Rate (bpm): 144   Movement: Present     General:  Alert, oriented and cooperative. Patient is in no acute distress.  Skin: Skin is warm and dry. No rash noted.   Cardiovascular: Normal heart rate noted  Respiratory: Normal respiratory effort, no problems with respiration noted  Abdomen: Soft, gravid, appropriate for gestational age.  Pain/Pressure: Absent     Pelvic: Cervical exam deferred        Extremities: Normal range of  motion.  Edema: Trace  Mental Status: Normal mood and affect. Normal behavior. Normal judgment and thought content.    [place] MFM OB FOLLOW UP  Result Date: 01/19/2023 ----------------------------------------------------------------------  OBSTETRICS REPORT                       (Signed Final 01/19/2023 12:02 pm) ---------------------------------------------------------------------- Patient Info  ID #:       [id number]                          D.O.B.:  Jan 17, 1996 (26 yrs)  Name:       Kimberly Farmer                Visit Date: 01/19/2023 11:05 am ---------------------------------------------------------------------- Performed By  Attending:        Noralee Space MD        Ref. Address:     [address]                                                             [address]  Performed By:     Kris Hartmann,      [facility]  Referred By:      [facility] ---------------------------------------------------------------------- Orders  #  Description                           Code        Ordered By  1  [place] MFM OB FOLLOW UP                   [phone]    Braxton Feathers ----------------------------------------------------------------------  #  Order #                     Accession #                Episode #  1  [id number]                   [id number]                 [id number] ---------------------------------------------------------------------- Indications  [redacted] weeks gestation of pregnancy                Z3A.36  Poor obstetric history: Previous               O09.299  preeclampsia / eclampsia/gestational HTN  History of cesarean delivery, currently        O34.219  pregnant  Obesity complicating pregnancy, third          O99.213  trimester (BMI 33.9)  Anemia during pregnancy in second  trimester    O99.012  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 Cord Insertion:      Previously visualized  Amniotic Fluid  AFI FV:      Within normal limits  AFI Sum(cm)     %Tile       Largest Pocket(cm)  22.72           86          6.19  RUQ(cm)       RLQ(cm)       LUQ(cm)        LLQ(cm)  6.19          4.47  5.96           6.1 ---------------------------------------------------------------------- Biometry  BPD:      92.6  mm     G. Age:  37w 4d         91  %    CI:        78.03   %    70 - 86                                                          FL/HC:      21.5   %    20.1 - 22.1  HC:      331.7  mm     G. Age:  37w 6d         62  %    HC/AC:      0.92        0.93 - 1.11  AC:       359   mm     G. Age:  39w 6d       > 99  %    FL/BPD:     77.0   %    71 - 87  FL:       71.3  mm     G. Age:  36w 4d         56  %    FL/AC:      19.9   %    20 - 24  LV:          8  mm  Est. FW:    3543  gm    7 lb 13 oz      97  % ---------------------------------------------------------------------- OB History  Gravidity:    5         Term:   3         SAB:   1  Living:       3 ---------------------------------------------------------------------- Gestational Age  LMP:           35w 4d        Date:  05/15/22                  EDD:   02/19/23  U/S Today:     38w 0d                                        EDD:   02/02/23  Best:          36w 1d     Det. ByMarcella Dubs         EDD:   02/15/23                                      (09/26/22) ---------------------------------------------------------------------- Anatomy  Cranium:               Appears  normal         Aortic Arch:            Appears normal  Cavum:  Appears normal         Ductal Arch:            Not well visualized  Ventricles:            Appears normal         Diaphragm:              Appears normal  Choroid Plexus:        Previously seen        Stomach:                Appears normal, left                                                                        sided  Cerebellum:            Previously seen        Abdomen:                Appears normal  Posterior Fossa:       Previously seen        Abdominal Wall:         Not well visualized  Nuchal Fold:           Not applicable (>20    Cord Vessels:           Appears normal ([redacted]                         wks GA)                                        vessel cord)  Face:                  Appears normal         Kidneys:                Appear normal  Lips:                  Previously seen        Bladder:                Appears normal  Thoracic:              Appears normal         Spine:                  Previously seen  Heart:                 Previously seen        Upper Extremities:      Previously seen  RVOT:                  Appears normal         Lower Extremities:      Previously seen  LVOT:                  Appears normal  Other:  Previous fetal anatomy visualized. RVOT, LVOT, AA visualized.  Female  gender. ---------------------------------------------------------------------- Cervix Uterus Adnexa  Right Ovary  Not visualized.  Left Ovary  Not visualized. ---------------------------------------------------------------------- Impression  Late prenatal care.  Patient does not have gestational  diabetes.  On today's ultrasound, the estimated fetal weight is at the  97th percentile and the abdominal circumference  measurement is at the 99th percentile.  Amniotic fluid is  normal good fetal activity seen.  Obstetric history significant for a term vaginal delivery  followed by 2 term cesarean deliveries.  Patient will be  undergoing  repeat cesarean delivery at [redacted] weeks gestation. ---------------------------------------------------------------------- Recommendations  -No follow-up appointments were made . ----------------------------------------------------------------------                 Noralee Spaceavi Shankar, MD Electronically Signed Final Report   01/19/2023 12:02 pm ----------------------------------------------------------------------   Assessment and Plan:  Pregnancy: [id number] at 3248w5d 1. Elevated blood pressure affecting pregnancy in third trimester, antepartum 2. History of gestational hypertension Recheck BP was normal. Advised to take Tylenol 1000 mg q6h prn headaches, let us know if worsens. Continue to monitor BP closely. [facility], she knows she will have to deliver earlier. Will check labs today.  - Comprehensive metabolic panel - CBC - Protein / creatinine ratio, urine  3. Anemia during pregnancy in third trimester Had iron infusion.  Prescribed oral iron therapy also to help.  - Ferric Maltol 30 MG CAPS; Take 1 capsule (30 mg total) by mouth 2 (two) times daily. Please take one hour before breakfast and dinner  Dispense: 60 capsule; Refill: 2  4. History of cesarean delivery affecting pregnancy Already scheduled for RCS and BTS.   5. [redacted] weeks gestation of pregnancy 6. Supervision of high risk pregnancy in third trimester Negative recent GC/Chlam. GBS bacteruria. No other concerns. Preterm labor symptoms and general obstetric precautions including but not limited to vaginal bleeding, contractions, leaking of fluid and fetal movement were reviewed in detail with the patient. Please refer to After Visit Summary for other counseling recommendations.   Return in about 1 week (around 01/30/2023) for OFFICE OB VISIT (MD only).  Future Appointments  Date Time Provider Department Center  01/25/2023  8:45 AM [facility] NURSE [facility]  02/06/2023  1:30 PM Constant, Gigi GinPeggy, MD [facility]     [name] Liona Wengert, MD

## 2023-01-24 LAB — PROTEIN / CREATININE RATIO, URINE
Creatinine, Urine: 109.9 mg/dL
Protein, Ur: 14.5 mg/dL
Protein/Creat Ratio: 132 mg/g creat (ref 0–200)

## 2023-01-24 LAB — CBC
Hematocrit: 34.4 % (ref 34.0–46.6)
Hemoglobin: 10.7 g/dL — ABNORMAL LOW (ref 11.1–15.9)
MCH: 23.3 pg — ABNORMAL LOW (ref 26.6–33.0)
MCHC: 31.1 g/dL — ABNORMAL LOW (ref 31.5–35.7)
MCV: 75 fL — ABNORMAL LOW (ref 79–97)
Platelets: 322 10*3/uL (ref 150–450)
RBC: 4.59 x10E6/uL (ref 3.77–5.28)
RDW: 20 % — ABNORMAL HIGH (ref 11.7–15.4)
WBC: 9.6 10*3/uL (ref 3.4–10.8)

## 2023-01-24 LAB — COMPREHENSIVE METABOLIC PANEL
ALT: 15 IU/L (ref 0–32)
AST: 13 IU/L (ref 0–40)
Albumin/Globulin Ratio: 1.7 (ref 1.2–2.2)
Albumin: 3.7 g/dL — ABNORMAL LOW (ref 4.0–5.0)
Alkaline Phosphatase: 134 IU/L — ABNORMAL HIGH (ref 44–121)
BUN/Creatinine Ratio: 12 (ref 9–23)
BUN: 5 mg/dL — ABNORMAL LOW (ref 6–20)
Bilirubin Total: <0.2 mg/dL (ref 0.0–1.2)
CO2: 18 mmol/L — ABNORMAL LOW (ref 20–29)
Calcium: 8.8 mg/dL (ref 8.7–10.2)
Chloride: 105 mmol/L (ref 96–106)
Creatinine, Ser: 0.42 mg/dL — ABNORMAL LOW (ref 0.57–1.00)
Globulin, Total: 2.2 g/dL (ref 1.5–4.5)
Glucose: 89 mg/dL (ref 70–99)
Potassium: 3.8 mmol/L (ref 3.5–5.2)
Sodium: 139 mmol/L (ref 134–144)
Total Protein: 5.9 g/dL — ABNORMAL LOW (ref 6.0–8.5)
eGFR: 138 mL/min/{1.73_m2} (ref >59)

## 2023-01-25 ENCOUNTER — Ambulatory Visit (INDEPENDENT_AMBULATORY_CARE_PROVIDER_SITE_OTHER): Payer: Medicaid Other | Admitting: *Deleted

## 2023-01-25 ENCOUNTER — Encounter (HOSPITAL_COMMUNITY): Payer: Self-pay

## 2023-01-25 ENCOUNTER — Encounter: Payer: Medicaid Other | Admitting: Obstetrics and Gynecology

## 2023-01-25 VITALS — BP 129/83 | HR 109

## 2023-01-25 DIAGNOSIS — Z3A37 37 weeks gestation of pregnancy: Secondary | ICD-10-CM

## 2023-01-25 DIAGNOSIS — O0993 Supervision of high risk pregnancy, unspecified, third trimester: Secondary | ICD-10-CM

## 2023-01-25 NOTE — Patient Instructions (Addendum)
Kimberly Farmer  01/25/2023   Your procedure is scheduled on:  02/08/2023  Arrive at 0730 at Entrance C on [address] at [facility]  and [employer]. You are invited to use the FREE valet parking or use the Visitor's parking deck.  Pick up the phone at the desk and dial [phone].  Call this number if you have problems the morning of surgery: [phone]  Remember:   Do not eat food:(After Midnight) Desps de medianoche.  Do not drink clear liquids: (After Midnight) Desps de medianoche.  Take these medicines the morning of surgery with A SIP OF WATER:  prozac   Do not wear jewelry, make-up or nail polish.  Do not wear lotions, powders, or perfumes. Do not wear deodorant.  Do not shave 48 hours prior to surgery.  Do not bring valuables to the hospital.  [facility] is not   responsible for any belongings or valuables brought to the hospital.  Contacts, dentures or bridgework may not be worn into surgery.  Leave suitcase in the car. After surgery it may be brought to your room.  For patients admitted to the hospital, checkout time is 11:00 AM the day of              discharge.      Please read over the following fact sheets that you were given:     Preparing for Surgery

## 2023-01-25 NOTE — Progress Notes (Signed)
Subjective:  Kimberly Farmer is a 27 y.o. female here for BP check. Headache resolved.   Hypertension ROS: Patient denies any headaches, visual symptoms, RUQ/epigastric pain or other concerning symptoms.  Objective:  BP 129/83   Pulse (!) 109   LMP 05/15/2022   Appearance alert, well appearing, and in no distress. General exam BP noted to be stable today in office.    Assessment:   Blood Pressure stable.   Plan:  Follow up next week at [facility] .   [name], RN

## 2023-01-30 ENCOUNTER — Encounter: Payer: Self-pay | Admitting: Obstetrics & Gynecology

## 2023-01-31 ENCOUNTER — Telehealth: Payer: Self-pay

## 2023-01-31 ENCOUNTER — Encounter: Payer: Medicaid Other | Admitting: Obstetrics & Gynecology

## 2023-01-31 NOTE — Telephone Encounter (Signed)
TC to pt to start virtual appt  No answer and not able to lvm.

## 2023-02-01 ENCOUNTER — Telehealth: Payer: Self-pay

## 2023-02-01 NOTE — Telephone Encounter (Signed)
Attempted to reach patient regarding message left with answering service. Pt vm is not set up

## 2023-02-06 ENCOUNTER — Telehealth (INDEPENDENT_AMBULATORY_CARE_PROVIDER_SITE_OTHER): Payer: Medicaid Other | Admitting: Obstetrics and Gynecology

## 2023-02-06 ENCOUNTER — Encounter (HOSPITAL_COMMUNITY)
Admission: RE | Admit: 2023-02-06 | Discharge: 2023-02-06 | Disposition: A | Discharge status: Home / Self Care | Payer: Medicaid Other | Source: Ambulatory Visit | Attending: Family Medicine | Admitting: Family Medicine

## 2023-02-06 ENCOUNTER — Encounter: Payer: Self-pay | Admitting: Obstetrics and Gynecology

## 2023-02-06 DIAGNOSIS — O99210 Obesity complicating pregnancy, unspecified trimester: Secondary | ICD-10-CM

## 2023-02-06 DIAGNOSIS — O34219 Maternal care for unspecified type scar from previous cesarean delivery: Secondary | ICD-10-CM | POA: Insufficient documentation

## 2023-02-06 DIAGNOSIS — O99213 Obesity complicating pregnancy, third trimester: Secondary | ICD-10-CM

## 2023-02-06 DIAGNOSIS — O34211 Maternal care for low transverse scar from previous cesarean delivery: Secondary | ICD-10-CM | POA: Diagnosis not present

## 2023-02-06 DIAGNOSIS — O09293 Supervision of pregnancy with other poor reproductive or obstetric history, third trimester: Secondary | ICD-10-CM

## 2023-02-06 DIAGNOSIS — O99820 Streptococcus B carrier state complicating pregnancy: Secondary | ICD-10-CM | POA: Diagnosis not present

## 2023-02-06 DIAGNOSIS — O99013 Anemia complicating pregnancy, third trimester: Secondary | ICD-10-CM

## 2023-02-06 DIAGNOSIS — E669 Obesity, unspecified: Secondary | ICD-10-CM | POA: Diagnosis not present

## 2023-02-06 DIAGNOSIS — Z01812 Encounter for preprocedural laboratory examination: Secondary | ICD-10-CM | POA: Diagnosis not present

## 2023-02-06 DIAGNOSIS — D649 Anemia, unspecified: Secondary | ICD-10-CM

## 2023-02-06 DIAGNOSIS — Z3A38 38 weeks gestation of pregnancy: Secondary | ICD-10-CM

## 2023-02-06 DIAGNOSIS — R8271 Bacteriuria: Secondary | ICD-10-CM

## 2023-02-06 DIAGNOSIS — Z8759 Personal history of other complications of pregnancy, childbirth and the puerperium: Secondary | ICD-10-CM

## 2023-02-06 DIAGNOSIS — O0993 Supervision of high risk pregnancy, unspecified, third trimester: Secondary | ICD-10-CM

## 2023-02-06 LAB — CBC
HCT: 36.3 % (ref 36.0–46.0)
Hemoglobin: 11 g/dL — ABNORMAL LOW (ref 12.0–15.0)
MCH: 23.1 pg — ABNORMAL LOW (ref 26.0–34.0)
MCHC: 30.3 g/dL (ref 30.0–36.0)
MCV: 76.3 fL — ABNORMAL LOW (ref 80.0–100.0)
Platelets: 335 10*3/uL (ref 150–400)
RBC: 4.76 MIL/uL (ref 3.87–5.11)
RDW: 19.4 % — ABNORMAL HIGH (ref 11.5–15.5)
WBC: 9.3 10*3/uL (ref 4.0–10.5)
nRBC: 0 % (ref 0.0–0.2)

## 2023-02-06 LAB — RAPID HIV SCREEN (HIV 1/2 AB+AG)
HIV 1/2 Antibodies: NONREACTIVE
HIV-1 P24 Antigen - HIV24: NONREACTIVE

## 2023-02-06 LAB — TYPE AND SCREEN
ABO/RH(D): O POS
Antibody Screen: NEGATIVE

## 2023-02-06 LAB — RPR: RPR Ser Ql: NONREACTIVE

## 2023-02-06 NOTE — Progress Notes (Signed)
CC: ROB  Double checking tubal still going to take place

## 2023-02-06 NOTE — Progress Notes (Signed)
OBSTETRICS PRENATAL VIRTUAL VISIT ENCOUNTER NOTE  Provider location: Center for [facility]   Patient location: Home  I connected with Kimberly Farmer on 02/06/23 at  1:30 PM EDT by MyChart Video Encounter and verified that I am speaking with the correct person using two identifiers. I discussed the limitations, risks, security and privacy concerns of performing an evaluation and management service virtually and the availability of in person appointments. I also discussed with the patient that there may be a patient responsible charge related to this service. The patient expressed understanding and agreed to proceed. Subjective:  Kimberly Farmer is a 27 y.o. [id number] at [redacted]w[redacted]d being seen today for ongoing prenatal care.  She is currently monitored for the following issues for this high-risk pregnancy and has F/H of alcoholism; BMI 36.0-36.9,adult; PCOS (polycystic ovarian syndrome); Obesity in pregnancy; History of gestational hypertension; History of cesarean delivery affecting pregnancy; Anxiety and depression; Malaise and fatigue; Migraine with aura and without status migrainosus, not intractable; Easy bruising; Chronic pain of right ankle; Left carpal tunnel syndrome; Reactive cervical lymphadenopathy; Prediabetes; IUD failure, pregnant; Supervision of high-risk pregnancy; GBS bacteriuria; and Anemia in pregnancy on their problem list.  Patient reports no complaints.  Contractions: Not present. Vag. Bleeding: None.  Movement: Present. Denies any leaking of fluid.   The following portions of the patient's history were reviewed and updated as appropriate: allergies, current medications, past family history, past medical history, past social history, past surgical history and problem list.   Objective:  There were no vitals filed for this visit.  Fetal Status:     Movement: Present     General:  Alert, oriented and cooperative. Patient is in no acute distress.   Respiratory: Normal respiratory effort, no problems with respiration noted  Mental Status: Normal mood and affect. Normal behavior. Normal judgment and thought content.  Rest of physical exam deferred due to type of encounter  Imaging: [place] MFM OB FOLLOW UP  Result Date: 01/19/2023 ----------------------------------------------------------------------  OBSTETRICS REPORT                       (Signed Final 01/19/2023 12:02 pm) ---------------------------------------------------------------------- Patient Info  ID #:       [id number]                          D.O.B.:  [birth year] (26 yrs)  Name:       Kimberly Farmer                Visit Date: 01/19/2023 11:05 am ---------------------------------------------------------------------- Performed By  Attending:        Noralee Space MD        Ref. Address:     [address]                                                             [address]  Performed By:     Kimberly Farmer,      [facility]  Referred By:      [facility] ---------------------------------------------------------------------- Orders  #  Description                           Code        Ordered By  1  [place] MFM OB FOLLOW UP                   [phone]    Kimberly Farmer ----------------------------------------------------------------------  #  Order #                     Accession #                Episode #  1  [id number]                   [id number]                 [id number] ---------------------------------------------------------------------- Indications  [redacted] weeks gestation of pregnancy                Z3A.36  Poor obstetric history: Previous               O09.299  preeclampsia / eclampsia/gestational HTN  History of cesarean delivery, currently        O34.219  pregnant   Obesity complicating pregnancy, third          O99.213  trimester (BMI 33.9)  Anemia during pregnancy in second trimester    O99.012  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 Cord Insertion:      Previously visualized  Amniotic Fluid  AFI FV:      Within normal limits  AFI Sum(cm)     %Tile       Largest Pocket(cm)  22.72           86          6.19  RUQ(cm)       RLQ(cm)       LUQ(cm)        LLQ(cm)  6.19          4.47  5.96           6.1 ---------------------------------------------------------------------- Biometry  BPD:      92.6  mm     G. Age:  37w 4d         91  %    CI:        78.03   %    70 - 86                                                          FL/HC:      21.5   %    20.1 - 22.1  HC:      331.7  mm     G. Age:  37w 6d         62  %    HC/AC:      0.92        0.93 - 1.11  AC:       359   mm     G. Age:  39w 6d       > 99  %    FL/BPD:     77.0   %    71 - 87  FL:       71.3  mm     G. Age:  36w 4d         56  %    FL/AC:      19.9   %    20 - 24  LV:          8  mm  Est. FW:    3543  gm    7 lb 13 oz      97  % ---------------------------------------------------------------------- OB History  Gravidity:    5         Term:   3         SAB:   1  Living:       3 ---------------------------------------------------------------------- Gestational Age  LMP:           35w 4d        Date:  05/15/22                  EDD:   02/19/23  U/S Today:     38w 0d                                        EDD:   02/02/23  Best:          36w 1d     Det. ByMarcella Dubs         EDD:   02/15/23                                      (09/26/22)  ---------------------------------------------------------------------- Anatomy  Cranium:               Appears normal         Aortic Arch:            Appears normal  Cavum:  Appears normal         Ductal Arch:            Not well visualized  Ventricles:            Appears normal         Diaphragm:              Appears normal  Choroid Plexus:        Previously seen        Stomach:                Appears normal, left                                                                        sided  Cerebellum:            Previously seen        Abdomen:                Appears normal  Posterior Fossa:       Previously seen        Abdominal Wall:         Not well visualized  Nuchal Fold:           Not applicable (>20    Cord Vessels:           Appears normal ([redacted]                         wks GA)                                        vessel cord)  Face:                  Appears normal         Kidneys:                Appear normal  Lips:                  Previously seen        Bladder:                Appears normal  Thoracic:              Appears normal         Spine:                  Previously seen  Heart:                 Previously seen        Upper Extremities:      Previously seen  RVOT:                  Appears normal         Lower Extremities:      Previously seen  LVOT:                  Appears normal  Other:  Previous fetal anatomy visualized. RVOT, LVOT, AA visualized.  Female  gender. ---------------------------------------------------------------------- Cervix Uterus Adnexa  Right Ovary  Not visualized.  Left Ovary  Not visualized. ---------------------------------------------------------------------- Impression  Late prenatal care.  Patient does not have gestational  diabetes.  On today's ultrasound, the estimated fetal weight is at the  97th percentile and the abdominal circumference  measurement is at the 99th percentile.  Amniotic fluid is  normal good fetal activity seen.  Obstetric history  significant for a term vaginal delivery  followed by 2 term cesarean deliveries.  Patient will be  undergoing repeat cesarean delivery at [redacted] weeks gestation. ---------------------------------------------------------------------- Recommendations  -No follow-up appointments were made . ----------------------------------------------------------------------                 Kimberly Space, MD Electronically Signed Final Report   01/19/2023 12:02 pm ----------------------------------------------------------------------   Assessment and Plan:  Pregnancy: [id number] at [redacted]w[redacted]d 1. Supervision of high risk pregnancy in third trimester Patient is doing well without complaints  2. History of cesarean delivery affecting pregnancy Scheduled for repeat c-section with BTL on 4/25  3. Obesity in pregnancy Continue ASA  4. GBS bacteriuria   5. History of gestational hypertension Patient unable to check BP during encounter. Advised patient to check BP later today and to contact us if elevated  6. Anemia during pregnancy in third trimester Patient discontinued iron supplement  Term labor symptoms and general obstetric precautions including but not limited to vaginal bleeding, contractions, leaking of fluid and fetal movement were reviewed in detail with the patient. I discussed the assessment and treatment plan with the patient. The patient was provided an opportunity to ask questions and all were answered. The patient agreed with the plan and demonstrated an understanding of the instructions. The patient was advised to call back or seek an in-person office evaluation/go to [facility] at [facility] for any urgent or concerning symptoms. Please refer to After Visit Summary for other counseling recommendations.   I provided 15 minutes of face-to-face time during this encounter.  Return in about 6 weeks (around 03/20/2023) for postpartum.  No future appointments.   Catalina Antigua, MD Center for [employer], [facility]

## 2023-02-08 ENCOUNTER — Other Ambulatory Visit: Payer: Self-pay

## 2023-02-08 ENCOUNTER — Encounter: Payer: Self-pay | Admitting: Obstetrics and Gynecology

## 2023-02-08 ENCOUNTER — Encounter (HOSPITAL_COMMUNITY): Payer: Self-pay | Admitting: Family Medicine

## 2023-02-08 ENCOUNTER — Encounter (HOSPITAL_COMMUNITY)
Admission: AD | Disposition: A | Discharge status: Home / Self Care | Payer: Self-pay | Source: Home / Self Care | Attending: Family Medicine

## 2023-02-08 ENCOUNTER — Inpatient Hospital Stay (HOSPITAL_COMMUNITY)
Admission: AD | Admit: 2023-02-08 | Discharge: 2023-02-11 | DRG: 784 | Disposition: A | Discharge status: Home / Self Care | Payer: Medicaid Other | Attending: Family Medicine | Admitting: Family Medicine

## 2023-02-08 ENCOUNTER — Other Ambulatory Visit: Payer: Self-pay | Admitting: Family Medicine

## 2023-02-08 ENCOUNTER — Inpatient Hospital Stay (HOSPITAL_COMMUNITY): Payer: Medicaid Other | Admitting: Anesthesiology

## 2023-02-08 DIAGNOSIS — O99344 Other mental disorders complicating childbirth: Secondary | ICD-10-CM | POA: Diagnosis present

## 2023-02-08 DIAGNOSIS — O3483 Maternal care for other abnormalities of pelvic organs, third trimester: Secondary | ICD-10-CM | POA: Diagnosis present

## 2023-02-08 DIAGNOSIS — Z302 Encounter for sterilization: Secondary | ICD-10-CM | POA: Diagnosis not present

## 2023-02-08 DIAGNOSIS — O99820 Streptococcus B carrier state complicating pregnancy: Secondary | ICD-10-CM | POA: Diagnosis not present

## 2023-02-08 DIAGNOSIS — F32A Depression, unspecified: Secondary | ICD-10-CM | POA: Diagnosis present

## 2023-02-08 DIAGNOSIS — O99019 Anemia complicating pregnancy, unspecified trimester: Secondary | ICD-10-CM | POA: Diagnosis present

## 2023-02-08 DIAGNOSIS — Z7982 Long term (current) use of aspirin: Secondary | ICD-10-CM

## 2023-02-08 DIAGNOSIS — O99824 Streptococcus B carrier state complicating childbirth: Secondary | ICD-10-CM | POA: Diagnosis present

## 2023-02-08 DIAGNOSIS — Z331 Pregnant state, incidental: Secondary | ICD-10-CM

## 2023-02-08 DIAGNOSIS — F419 Anxiety disorder, unspecified: Secondary | ICD-10-CM | POA: Diagnosis present

## 2023-02-08 DIAGNOSIS — Z9851 Tubal ligation status: Secondary | ICD-10-CM

## 2023-02-08 DIAGNOSIS — Z3A39 39 weeks gestation of pregnancy: Secondary | ICD-10-CM

## 2023-02-08 DIAGNOSIS — Z9079 Acquired absence of other genital organ(s): Secondary | ICD-10-CM

## 2023-02-08 DIAGNOSIS — O164 Unspecified maternal hypertension, complicating childbirth: Secondary | ICD-10-CM

## 2023-02-08 DIAGNOSIS — O0993 Supervision of high risk pregnancy, unspecified, third trimester: Principal | ICD-10-CM

## 2023-02-08 DIAGNOSIS — O34211 Maternal care for low transverse scar from previous cesarean delivery: Principal | ICD-10-CM | POA: Diagnosis present

## 2023-02-08 DIAGNOSIS — N838 Other noninflammatory disorders of ovary, fallopian tube and broad ligament: Secondary | ICD-10-CM | POA: Diagnosis present

## 2023-02-08 DIAGNOSIS — Z5982 Transportation insecurity: Secondary | ICD-10-CM

## 2023-02-08 DIAGNOSIS — D62 Acute posthemorrhagic anemia: Secondary | ICD-10-CM | POA: Diagnosis not present

## 2023-02-08 DIAGNOSIS — O9902 Anemia complicating childbirth: Secondary | ICD-10-CM | POA: Diagnosis present

## 2023-02-08 DIAGNOSIS — Z79899 Other long term (current) drug therapy: Secondary | ICD-10-CM | POA: Diagnosis not present

## 2023-02-08 DIAGNOSIS — O34219 Maternal care for unspecified type scar from previous cesarean delivery: Secondary | ICD-10-CM

## 2023-02-08 DIAGNOSIS — O0990 Supervision of high risk pregnancy, unspecified, unspecified trimester: Secondary | ICD-10-CM

## 2023-02-08 DIAGNOSIS — Z98891 History of uterine scar from previous surgery: Secondary | ICD-10-CM

## 2023-02-08 DIAGNOSIS — T8331XA Breakdown (mechanical) of intrauterine contraceptive device, initial encounter: Secondary | ICD-10-CM

## 2023-02-08 LAB — CREATININE, SERUM
Creatinine, Ser: 0.48 mg/dL (ref 0.44–1.00)
GFR, Estimated: >60 mL/min (ref >60)

## 2023-02-08 SURGERY — Surgical Case
Anesthesia: Spinal | Site: Abdomen

## 2023-02-08 MED ORDER — SIMETHICONE 80 MG PO CHEW: 80.0000 mg | CHEWABLE_TABLET | ORAL | Status: DC | PRN

## 2023-02-08 MED ORDER — OXYTOCIN-SODIUM CHLORIDE 30-0.9 UT/500ML-% IV SOLN
2.5000 [IU]/h | INTRAVENOUS | Status: AC
  Administered 2023-02-08: 2.5 [IU]/h via INTRAVENOUS
  Filled 2023-02-08: qty 500

## 2023-02-08 MED ORDER — ONDANSETRON HCL 4 MG/2ML IJ SOLN
INTRAMUSCULAR | Status: DC | PRN
  Administered 2023-02-08: 4 mg via INTRAVENOUS

## 2023-02-08 MED ORDER — SOD CITRATE-CITRIC ACID 500-334 MG/5ML PO SOLN
30.0000 mL | ORAL | Status: AC
  Administered 2023-02-08: 30 mL via ORAL

## 2023-02-08 MED ORDER — LACTATED RINGERS IV SOLN: INTRAVENOUS | Status: DC | PRN

## 2023-02-08 MED ORDER — DIPHENHYDRAMINE HCL 25 MG PO CAPS: 25.0000 mg | ORAL_CAPSULE | Freq: Four times a day (QID) | ORAL | Status: DC | PRN

## 2023-02-08 MED ORDER — CEFAZOLIN SODIUM-DEXTROSE 2-4 GM/100ML-% IV SOLN
INTRAVENOUS | Status: AC
  Filled 2023-02-08: qty 100

## 2023-02-08 MED ORDER — ACETAMINOPHEN 10 MG/ML IV SOLN
INTRAVENOUS | Status: DC | PRN
  Administered 2023-02-08: 1000 mg via INTRAVENOUS

## 2023-02-08 MED ORDER — OXYTOCIN-SODIUM CHLORIDE 30-0.9 UT/500ML-% IV SOLN
INTRAVENOUS | Status: AC
  Filled 2023-02-08: qty 500

## 2023-02-08 MED ORDER — SIMETHICONE 80 MG PO CHEW
80.0000 mg | CHEWABLE_TABLET | Freq: Three times a day (TID) | ORAL | Status: DC
  Administered 2023-02-08 – 2023-02-11 (×8): 80 mg via ORAL
  Filled 2023-02-08 (×8): qty 1

## 2023-02-08 MED ORDER — MORPHINE SULFATE (PF) 0.5 MG/ML IJ SOLN
INTRAMUSCULAR | Status: AC
  Filled 2023-02-08: qty 10

## 2023-02-08 MED ORDER — PHENYLEPHRINE HCL (PRESSORS) 10 MG/ML IV SOLN
INTRAVENOUS | Status: DC | PRN
  Administered 2023-02-08 (×3): 80 ug via INTRAVENOUS

## 2023-02-08 MED ORDER — KETOROLAC TROMETHAMINE 30 MG/ML IJ SOLN
30.0000 mg | Freq: Four times a day (QID) | INTRAMUSCULAR | Status: AC
  Administered 2023-02-08: 30 mg via INTRAVENOUS
  Filled 2023-02-08: qty 1

## 2023-02-08 MED ORDER — OXYTOCIN-SODIUM CHLORIDE 30-0.9 UT/500ML-% IV SOLN: INTRAVENOUS | Status: DC | PRN

## 2023-02-08 MED ORDER — ACETAMINOPHEN 500 MG PO TABS
1000.0000 mg | ORAL_TABLET | Freq: Four times a day (QID) | ORAL | Status: DC
  Administered 2023-02-08 – 2023-02-11 (×10): 1000 mg via ORAL
  Filled 2023-02-08 (×12): qty 2

## 2023-02-08 MED ORDER — SENNOSIDES-DOCUSATE SODIUM 8.6-50 MG PO TABS
2.0000 | ORAL_TABLET | Freq: Every day | ORAL | Status: DC
  Administered 2023-02-09 – 2023-02-11 (×3): 2 via ORAL
  Filled 2023-02-08 (×4): qty 2

## 2023-02-08 MED ORDER — COCONUT OIL OIL: 1.0000 | TOPICAL_OIL | Status: DC | PRN

## 2023-02-08 MED ORDER — OXYTOCIN-SODIUM CHLORIDE 30-0.9 UT/500ML-% IV SOLN
INTRAVENOUS | Status: DC | PRN
  Administered 2023-02-08: 200 mL via INTRAVENOUS

## 2023-02-08 MED ORDER — IBUPROFEN 600 MG PO TABS
600.0000 mg | ORAL_TABLET | Freq: Four times a day (QID) | ORAL | Status: DC
  Administered 2023-02-09 – 2023-02-11 (×9): 600 mg via ORAL
  Filled 2023-02-08 (×10): qty 1

## 2023-02-08 MED ORDER — MEASLES, MUMPS & RUBELLA VAC IJ SOLR: 0.5000 mL | Freq: Once | INTRAMUSCULAR | Status: DC

## 2023-02-08 MED ORDER — GABAPENTIN 100 MG PO CAPS
200.0000 mg | ORAL_CAPSULE | Freq: Every day | ORAL | Status: DC
  Administered 2023-02-09 – 2023-02-10 (×3): 200 mg via ORAL
  Filled 2023-02-08 (×3): qty 2

## 2023-02-08 MED ORDER — STERILE WATER FOR IRRIGATION IR SOLN
Status: DC | PRN
  Administered 2023-02-08: 1000 mL

## 2023-02-08 MED ORDER — PHENYLEPHRINE HCL-NACL 20-0.9 MG/250ML-% IV SOLN
INTRAVENOUS | Status: DC | PRN
  Administered 2023-02-08: 60 ug/min via INTRAVENOUS

## 2023-02-08 MED ORDER — LACTATED RINGERS IV SOLN: INTRAVENOUS | Status: DC

## 2023-02-08 MED ORDER — MORPHINE SULFATE (PF) 0.5 MG/ML IJ SOLN
INTRAMUSCULAR | Status: DC | PRN
  Administered 2023-02-08: .15 mg via INTRATHECAL

## 2023-02-08 MED ORDER — FENTANYL CITRATE (PF) 100 MCG/2ML IJ SOLN
INTRAMUSCULAR | Status: DC | PRN
  Administered 2023-02-08: 15 ug via INTRATHECAL

## 2023-02-08 MED ORDER — FENTANYL CITRATE (PF) 100 MCG/2ML IJ SOLN
INTRAMUSCULAR | Status: AC
  Filled 2023-02-08: qty 2

## 2023-02-08 MED ORDER — WITCH HAZEL-GLYCERIN EX PADS: 1.0000 | MEDICATED_PAD | CUTANEOUS | Status: DC | PRN

## 2023-02-08 MED ORDER — CEFAZOLIN SODIUM-DEXTROSE 2-4 GM/100ML-% IV SOLN
2.0000 g | INTRAVENOUS | Status: AC
  Administered 2023-02-08: 2 g via INTRAVENOUS

## 2023-02-08 MED ORDER — DEXAMETHASONE SODIUM PHOSPHATE 10 MG/ML IJ SOLN
INTRAMUSCULAR | Status: DC | PRN
  Administered 2023-02-08: 10 mg via INTRAVENOUS

## 2023-02-08 MED ORDER — MAGNESIUM HYDROXIDE 400 MG/5ML PO SUSP: 30.0000 mL | ORAL | Status: DC | PRN

## 2023-02-08 MED ORDER — ONDANSETRON HCL 4 MG/2ML IJ SOLN
INTRAMUSCULAR | Status: AC
  Filled 2023-02-08: qty 2

## 2023-02-08 MED ORDER — SODIUM CHLORIDE 0.9 % IR SOLN
Status: DC | PRN
  Administered 2023-02-08: 1000 mL

## 2023-02-08 MED ORDER — BUPIVACAINE IN DEXTROSE 0.75-8.25 % IT SOLN
INTRATHECAL | Status: DC | PRN
  Administered 2023-02-08: 1.6 mL via INTRATHECAL

## 2023-02-08 MED ORDER — SOD CITRATE-CITRIC ACID 500-334 MG/5ML PO SOLN
ORAL | Status: AC
  Filled 2023-02-08: qty 30

## 2023-02-08 MED ORDER — TETANUS-DIPHTH-ACELL PERTUSSIS 5-2.5-18.5 LF-MCG/0.5 IM SUSY: 0.5000 mL | PREFILLED_SYRINGE | Freq: Once | INTRAMUSCULAR | Status: DC

## 2023-02-08 MED ORDER — MENTHOL 3 MG MT LOZG
1.0000 | LOZENGE | OROMUCOSAL | Status: DC | PRN
  Administered 2023-02-09: 3 mg via ORAL
  Filled 2023-02-08: qty 9

## 2023-02-08 MED ORDER — ZOLPIDEM TARTRATE 5 MG PO TABS: 5.0000 mg | ORAL_TABLET | Freq: Every evening | ORAL | Status: DC | PRN

## 2023-02-08 MED ORDER — PRENATAL MULTIVITAMIN CH
1.0000 | ORAL_TABLET | Freq: Every day | ORAL | Status: DC
  Administered 2023-02-08 – 2023-02-10 (×3): 1 via ORAL
  Filled 2023-02-08 (×3): qty 1

## 2023-02-08 MED ORDER — DIBUCAINE (PERIANAL) 1 % EX OINT: 1.0000 | TOPICAL_OINTMENT | CUTANEOUS | Status: DC | PRN

## 2023-02-08 MED ORDER — ENOXAPARIN SODIUM 60 MG/0.6ML IJ SOSY
60.0000 mg | PREFILLED_SYRINGE | INTRAMUSCULAR | Status: DC
  Administered 2023-02-09 – 2023-02-11 (×3): 60 mg via SUBCUTANEOUS
  Filled 2023-02-08 (×3): qty 0.6

## 2023-02-08 SURGICAL SUPPLY — 31 items
ADH SKN CLS APL DERMABOND .7 (GAUZE/BANDAGES/DRESSINGS) ×1
ADH SKN CLS LQ APL DERMABOND (GAUZE/BANDAGES/DRESSINGS) ×2
CANISTER SUCT 3000ML PPV (MISCELLANEOUS) ×1 IMPLANT
CHLORAPREP W/TINT 26ML (MISCELLANEOUS) ×2 IMPLANT
CLAMP UMBILICAL CORD (MISCELLANEOUS) ×1 IMPLANT
CLOTH BEACON ORANGE TIMEOUT ST (SAFETY) ×1 IMPLANT
DERMABOND ADVANCED .7 DNX12 (GAUZE/BANDAGES/DRESSINGS) ×1 IMPLANT
DERMABOND ADVANCED .7 DNX6 (GAUZE/BANDAGES/DRESSINGS) IMPLANT
DRAPE SHEET LG 3/4 BI-LAMINATE (DRAPES) ×1 IMPLANT
DRSG OPSITE POSTOP 4X10 (GAUZE/BANDAGES/DRESSINGS) ×1 IMPLANT
ELECT REM PT RETURN 9FT ADLT (ELECTROSURGICAL) ×1
ELECTRODE REM PT RTRN 9FT ADLT (ELECTROSURGICAL) ×1 IMPLANT
GAUZE SPONGE 4X4 12PLY STRL LF (GAUZE/BANDAGES/DRESSINGS) IMPLANT
GLOVE BIOGEL PI IND STRL 7.0 (GLOVE) ×3 IMPLANT
GLOVE ECLIPSE 6.5 STRL STRAW (GLOVE) ×1 IMPLANT
GOWN STRL REUS W/ TWL LRG LVL3 (GOWN DISPOSABLE) ×2 IMPLANT
GOWN STRL REUS W/TWL LRG LVL3 (GOWN DISPOSABLE) ×2
NS IRRIG 1000ML POUR BTL (IV SOLUTION) ×1 IMPLANT
PAD OB MATERNITY 4.3X12.25 (PERSONAL CARE ITEMS) ×1 IMPLANT
PAD PREP 24X48 CUFFED NSTRL (MISCELLANEOUS) ×1 IMPLANT
RETRACTOR WND ALEXIS 25 LRG (MISCELLANEOUS) IMPLANT
RTRCTR WOUND ALEXIS 25CM LRG (MISCELLANEOUS)
SUT MNCRL AB 0 CT1 27 (SUTURE) ×1 IMPLANT
SUT PLAIN 2 0 XLH (SUTURE) ×1 IMPLANT
SUT VIC AB 0 CT1 36 (SUTURE) ×2 IMPLANT
SUT VIC AB 2-0 CT1 27 (SUTURE) ×1
SUT VIC AB 2-0 CT1 TAPERPNT 27 (SUTURE) ×1 IMPLANT
SUT VIC AB 4-0 KS 27 (SUTURE) ×1 IMPLANT
TOWEL OR 17X24 6PK STRL BLUE (TOWEL DISPOSABLE) ×3 IMPLANT
TRAY FOLEY CATH SILVER 16FR (SET/KITS/TRAYS/PACK) ×1 IMPLANT
WATER STERILE IRR 1000ML POUR (IV SOLUTION) ×1 IMPLANT

## 2023-02-08 NOTE — Anesthesia Postprocedure Evaluation (Signed)
Anesthesia Post Note  Patient: [name]  Procedure(s) Performed: CESAREAN SECTION WITH BILATERAL TUBAL LIGATION (Abdomen)     Patient location during evaluation: PACU Anesthesia Type: Spinal Level of consciousness: oriented and awake and alert Pain management: pain level controlled Vital Signs Assessment: post-procedure vital signs reviewed and stable Respiratory status: spontaneous breathing, respiratory function stable and patient connected to nasal cannula oxygen Cardiovascular status: blood pressure returned to baseline and stable Postop Assessment: no headache, no backache and no apparent nausea or vomiting Anesthetic complications: no  No notable events documented.  Last Vitals:  Vitals:   02/08/23 1215 02/08/23 1340  BP: 97/67 112/88  Pulse: (!) 56 64  Resp: 18 18  Temp: 36.7 C 36.7 C  SpO2: 98% 99%    Last Pain:  Vitals:   02/08/23 1340  TempSrc: Oral  PainSc:    Pain Goal:                   Shelton Silvas

## 2023-02-08 NOTE — H&P (Signed)
Obstetric Preoperative History and Physical  Kimberly Farmer is a 27 y.o. [phone] with IUP at [redacted]w[redacted]d presenting for presenting for scheduled cesarean section.  No acute concerns.   Prenatal Course Source of Care: [facility]  with onset of care at 14 weeks Pregnancy complications or risks: Patient Active Problem List   Diagnosis Date Noted   S/P repeat low transverse C-section 02/08/2023   GBS bacteriuria 08/28/2022   Anemia in pregnancy 08/28/2022   Supervision of high-risk pregnancy 08/25/2022   IUD failure, pregnant 06/17/2022   Prediabetes 02/20/2022   Migraine with aura and without status migrainosus, not intractable 02/08/2022   Easy bruising 02/08/2022   Chronic pain of right ankle 02/08/2022   Left carpal tunnel syndrome 02/08/2022   Reactive cervical lymphadenopathy 02/08/2022   Malaise and fatigue 01/03/2022   Anxiety and depression 09/01/2021   History of cesarean delivery affecting pregnancy 09/21/2020   History of gestational hypertension 08/25/2020   Obesity in pregnancy 06/25/2018   PCOS (polycystic ovarian syndrome) 03/05/2018   BMI 36.0-36.9,adult 10/11/2016   F/H of alcoholism 09/27/2015   She plans to breastfeed She desires bilateral tubal ligation for postpartum contraception.   Prenatal labs and studies: ABO, Rh: --/--/O POS (04/23 5621) Antibody: NEG (04/23 0951) Rubella: 2.56 (11/10 0929) RPR: NON REACTIVE (04/23 0947)  HBsAg: Negative (11/10 0929)  HIV: NON REACTIVE (04/23 0947)  GBS:  Genetic screening normal Anatomy US normal  Prenatal Transfer Tool  Maternal Diabetes: No Genetic Screening: Normal Maternal Ultrasounds/Referrals: Normal Fetal Ultrasounds or other Referrals:  None Maternal Substance Abuse:  No Significant Maternal Medications:  None Significant Maternal Lab Results: Group B Strep positive  Past Medical History:  Diagnosis Date   Arthritis    Bilateral knees and back   Breakthrough bleeding on birth control pills  05/16/2018   Cesarean wound infection 03/18/2019   Choroid plexus cysts, fetal, affecting care of mother, antepartum 09/17/2018   Gestational hypertension 03/01/2021   Lumbar disc disease    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   PCOS (polycystic ovarian syndrome)    Post-dates pregnancy 02/21/2019   Pregnancy induced hypertension    Tick bite of left breast 02/24/2022   Yellow eyes 02/08/2022    Past Surgical History:  Procedure Laterality Date   CESAREAN SECTION N/A 02/21/2019   Procedure: CESAREAN SECTION;  Surgeon: Nadara Mustard, MD;  Location: [facility] ORS;  Service: Obstetrics;  Laterality: N/A;   CESAREAN SECTION N/A 03/02/2021   Procedure: CESAREAN SECTION;  Surgeon: Tereso Newcomer, MD;  Location: [facility] LD ORS;  Service: Obstetrics;  Laterality: N/A;   KNEE SURGERY Left 2011   [name] has had 5 surgeries on her knee   TONSILLECTOMY AND ADENOIDECTOMY  2007    OB History  Gravida Para Term Preterm AB Living  5 3 3  0 1 3  SAB IAB Ectopic Multiple Live Births  1 0 0 0 3    # Outcome Date GA Lbr Len/2nd Weight Sex Delivery Anes PTL Lv  5 Current           4 Term 03/02/21 [redacted]w[redacted]d    CS-Unspec     3 Term 02/21/19 [redacted]w[redacted]d  4320 g M CS-LTranv Spinal, EPI  LIV  2 Term 04/28/17 [redacted]w[redacted]d 02:57 / 03:53 3595 g M Vag-Spont EPI  LIV  1 SAB 10/16/14 [redacted]w[redacted]d    SAB       Social History   Socioeconomic History   Marital status: Single    Spouse name: Not on file   Number  of children: Not on file   Years of education: Not on file   Highest education level: Not on file  Occupational History   Not on file  Tobacco Use   Smoking status: Never   Smokeless tobacco: Never   Tobacco comments:    Vape - Reduced Nicotine  Vaping Use   Vaping Use: Former   Substances: Nicotine  Substance and Sexual Activity   Alcohol use: No   Drug use: No   Sexual activity: Not Currently    Partners: Male    Birth control/protection: None  Other Topics Concern   Not on file  Social History Narrative   Not on  file   Social Determinants of Health   Financial Resource Strain: Low Risk  (10/25/2021)   Overall Financial Resource Strain (CARDIA)    Difficulty of Paying Living Expenses: Not very hard  Food Insecurity: No Food Insecurity (02/08/2023)   Hunger Vital Sign    Worried About Running Out of Food in the Last Year: Never true    Ran Out of Food in the Last Year: Never true  Transportation Needs: Unmet Transportation Needs (02/08/2023)   PRAPARE - Administrator, Civil Service (Medical): Yes    Lack of Transportation (Non-Medical): No  Physical Activity: Insufficiently Active (01/26/2019)   Exercise Vital Sign    Days of Exercise per Week: 4 days    Minutes of Exercise per Session: 20 min  Stress: Stress Concern Present (10/25/2021)   [employer] of Occupational Health - Occupational Stress Questionnaire    Feeling of Stress : Rather much  Social Connections: Unknown (01/26/2019)   Social Connection and Isolation Panel [NHANES]    Frequency of Communication with Friends and Family: Patient declined    Frequency of Social Gatherings with Friends and Family: Patient declined    Attends Religious Services: Patient declined    Database administrator or Organizations: Patient declined    Attends Engineer, structural: Patient declined    Marital Status: Patient declined    Family History  Problem Relation Age of Onset   Hypertension Mother    Alcohol abuse Father    Diabetes Maternal Grandmother    Hypertension Maternal Grandmother    Cancer Paternal Grandfather        skin    Medications Prior to Admission  Medication Sig Dispense Refill Last Dose   aspirin EC 81 MG tablet Take 1 tablet (81 mg total) by mouth daily. 60 tablet 1    Cholecalciferol (VITAMIN D) 50 MCG (2000 UT) CAPS Take 1 capsule by mouth daily. (Patient not taking: Reported on 11/11/2022)      cyclobenzaprine (FLEXERIL) 10 MG tablet Take 1 tablet (10 mg total) by mouth 2 (two) times daily as  needed for muscle spasms. (Patient not taking: Reported on 01/19/2023) 20 tablet 0    Ferric Maltol 30 MG CAPS Take 1 capsule (30 mg total) by mouth 2 (two) times daily. Please take one hour before breakfast and dinner 60 capsule 2    ferrous sulfate (FERROUSUL) 325 (65 FE) MG tablet Take 1 tablet (325 mg total) by mouth every other day. (Patient not taking: Reported on 01/09/2023) 60 tablet 1    FLUoxetine (PROZAC) 20 MG capsule Take 1 capsule (20 mg total) by mouth daily. 30 capsule 4    guaiFENesin (ROBITUSSIN) 100 MG/5ML liquid Take 5-10 mLs (100-200 mg total) by mouth every 4 (four) hours as needed for cough or to loosen phlegm. 60 mL  0    hydrOXYzine (ATARAX) 25 MG tablet Take 1 tablet (25 mg total) by mouth every 6 (six) hours as needed for anxiety. (Patient not taking: Reported on 12/15/2022) 30 tablet 2    terconazole (TERAZOL 7) 0.4 % vaginal cream Place 1 applicator vaginally at bedtime. Use for seven days 45 g 0    vitamin B-12 (CYANOCOBALAMIN) 1000 MCG tablet Take by mouth daily. (Patient not taking: Reported on 11/11/2022)       Allergies  Allergen Reactions   Hydrocodone Other (See Comments)    Other Reaction: SEVERE HA & GI Upset   Oxycodone Other (See Comments)    Other Reaction: SEVERE HA & GI Upset   Tape Rash    Review of Systems: Negative except for what is mentioned in HPI.  Physical Exam: LMP 05/15/2022  FHR by Doppler: 145 bpm CONSTITUTIONAL: Well-developed, well-nourished female in no acute distress.  HENT:  Normocephalic, atraumatic, External right and left ear normal. Oropharynx is clear and moist EYES: Conjunctivae and EOM are normal. Pupils are equal, round, and reactive to light. No scleral icterus.  NECK: Normal range of motion, supple, no masses SKIN: Skin is warm and dry. No rash noted. Not diaphoretic. No erythema. No pallor. NEUROLGIC: Alert and oriented to person, place, and time. Normal reflexes, muscle tone coordination. No cranial nerve deficit  noted. PSYCHIATRIC: Normal mood and affect. Normal behavior. Normal judgment and thought content. CARDIOVASCULAR: Normal heart rate noted, regular rhythm RESPIRATORY: Effort and breath sounds normal, no problems with respiration noted ABDOMEN: Soft, nontender, nondistended, gravid. Well-healed Pfannenstiel incision. PELVIC: Deferred MUSCULOSKELETAL: Normal range of motion. No edema and no tenderness. 2+ distal pulses.   Pertinent Labs/Studies:   Results for orders placed or performed during the hospital encounter of 02/06/23 (from the past 72 hour(s))  CBC     Status: Abnormal   Collection Time: 02/06/23  9:47 AM  Result Value Ref Range   WBC 9.3 4.0 - 10.5 K/uL   RBC 4.76 3.87 - 5.11 MIL/uL   Hemoglobin 11.0 (L) 12.0 - 15.0 g/dL   HCT 16.1 09.6 - 04.5 %   MCV 76.3 (L) 80.0 - 100.0 fL   MCH 23.1 (L) 26.0 - 34.0 pg   MCHC 30.3 30.0 - 36.0 g/dL   RDW 40.9 (H) 81.1 - 91.4 %   Platelets 335 150 - 400 K/uL   nRBC 0.0 0.0 - 0.2 %    Comment: Performed at [facility], [address] [place], [place] [address]  RPR     Status: None   Collection Time: 02/06/23  9:47 AM  Result Value Ref Range   RPR Ser Ql NON REACTIVE NON REACTIVE    Comment: Performed at [facility], [address] [place], [place] [address]  Rapid HIV screen (HIV 1/2 Ab+Ag)     Status: None   Collection Time: 02/06/23  9:47 AM  Result Value Ref Range   HIV-1 P24 Antigen - HIV24 NON REACTIVE NON REACTIVE    Comment: (NOTE) Detection of p24 may be inhibited by biotin in the sample, causing false negative results in acute infection.    HIV 1/2 Antibodies NON REACTIVE NON REACTIVE   Interpretation (HIV Ag Ab)      A non reactive test result means that HIV 1 or HIV 2 antibodies and HIV 1 p24 antigen were not detected in the specimen.    Comment: Performed at [facility], [address], [place], [place] [address]  Type and screen     Status: None   Collection Time: 02/06/23  9:51 AM  Result  Value Ref Range   ABO/RH(D) O POS    Antibody Screen NEG    Sample Expiration      02/09/2023,2359 Performed at [facility], [address], [place], [place] [address]     Assessment and Plan :TAHRA HITZEMAN is a 27 y.o. [id number] at [redacted]w[redacted]d being admitted being admitted for scheduled cesarean section. The risks of cesarean section discussed with the patient included but were not limited to: bleeding which may require transfusion or reoperation; infection which may require antibiotics; injury to bowel, bladder, ureters or other surrounding organs; injury to the fetus; need for additional procedures including hysterectomy in the event of a life-threatening hemorrhage; placental abnormalities wth subsequent pregnancies, incisional problems, thromboembolic phenomenon and other postoperative/anesthesia complications.   She expressed that she strongly desire permanent sterilization. We discussed bilateral salpingectomy specifically for sterilization and ovarian cancer She declines all other modalities. Risks of procedure discussed with patient including but not limited to: risk of regret, permanence of method, bleeding, infection, injury to surrounding organs and need for additional procedures.  Failure risk of 0.5-1% with increased risk of ectopic gestation if pregnancy occurs was also discussed with patient.    The patient concurred with the proposed plan, giving informed written consent for the procedure. Patient has been NPO since last night she will remain NPO for procedure. Anesthesia and OR aware. Preoperative prophylactic antibiotics and SCDs ordered on call to the OR. To OR when ready.    Federico Flake, MD, MPH, ABFM Attending Physician Center for [facility]

## 2023-02-08 NOTE — Transfer of Care (Signed)
Immediate Anesthesia Transfer of Care Note  Patient: Kimberly Farmer  Procedure(s) Performed: CESAREAN SECTION WITH BILATERAL TUBAL LIGATION (Abdomen)  Patient Location: PACU  Anesthesia Type:Spinal  Level of Consciousness: awake, alert , and oriented  Airway & Oxygen Therapy: Patient Spontanous Breathing  Post-op Assessment: Report given to RN and Post -op Vital signs reviewed and stable  HR 64, RR 16, SaO2 100%, BP 110/63  Post vital signs: Reviewed and stable  Last Vitals:  Vitals Value Taken Time  BP    Temp    Pulse    Resp    SpO2      Last Pain:  Vitals:   02/08/23 0823  PainSc: 0-No pain         Complications: No notable events documented.

## 2023-02-08 NOTE — OR Nursing (Signed)
Dr. Alvester Morin in OR case for C-section made incision at 1000am got call from [facility] RN in need of an attending to assist with delivery on LD with NR FHR  requiring a vacuum delivery.   Dr, Alvester Morin left OR B to asist with del incision cover with wet dressing   Dr. Alvester Morin returned at 1019 and restarted case at 1020am proceeding as usual without difficulties.

## 2023-02-08 NOTE — Discharge Summary (Signed)
Postpartum Discharge Summary  Date of Service updated-yes     Patient Name: Kimberly Farmer DOB: 1996/05/17 MRN: [id number]  Date of admission: 02/08/2023 Delivery date:02/08/2023  Delivering provider: Federico Flake  Date of discharge: 02/11/2023  Admitting diagnosis: S/P repeat low transverse C-section [Z98.891] Status post repeat low transverse cesarean section [Z98.891] Intrauterine pregnancy: [redacted]w[redacted]d     Secondary diagnosis:  Principal Problem:   S/P repeat low transverse C-section Active Problems:   Anxiety and depression   IUD failure, pregnant   Supervision of high-risk pregnancy   Anemia in pregnancy   H/O bilateral salpingectomy   Postpartum acute blood loss anemia  Additional problems: GBS bacteriuria    Discharge diagnosis: Term Pregnancy Delivered and Anemia                                              Post partum procedures: none Augmentation: N/A Complications: None  Hospital course: Sceduled C/S   27 y.o. yo [id number] at [redacted]w[redacted]d was admitted to the hospital 02/08/2023 for scheduled cesarean section with the following indication:Elective Repeat.Delivery details are as follows:  Membrane Rupture Time/Date: 10:24 AM ,02/08/2023   Delivery Method:C-Section, Low Transverse  Details of operation can be found in separate operative note.  Patient had a postpartum course uncomplicated.  She is ambulating, tolerating a regular diet, passing flatus, and urinating well. Patient is discharged home in stable condition on  02/11/23        Newborn Data: Birth date:02/08/2023  Birth time:10:24 AM  Gender:Female  Living status:Living  Apgars:8 ,9  Weight:3880 g     Magnesium Sulfate received: No BMZ received: No Rhophylac:N/A MMR:N/A T-DaP: none Flu: No Transfusion:No  Physical exam  Vitals:   02/09/23 2100 02/10/23 0316 02/10/23 2204 02/11/23 0541  BP: 132/70 (!) 132/58 127/79 121/72  Pulse: 87 82 81 81  Resp: 18 16 18 18   Temp: 98.5 F (36.9 C) 98 F (36.7 C)  97.8 F (36.6 C) 98 F (36.7 C)  TempSrc: Oral  Oral Oral  SpO2: 98% 99% 99% 98%   General: alert, cooperative, and no distress Lochia: appropriate Uterine Fundus: firm Incision: Dressing is clean, dry, and intact DVT Evaluation: No evidence of DVT seen on physical exam. Negative Homan's sign. No cords or calf tenderness. No significant calf/ankle edema. Labs: Lab Results  Component Value Date   WBC 9.1 02/09/2023   HGB 8.9 (L) 02/09/2023   HCT 30.0 (L) 02/09/2023   MCV 77.7 (L) 02/09/2023   PLT 259 02/09/2023      Latest Ref Rng & Units 02/08/2023    2:23 PM  CMP  Creatinine 0.44 - 1.00 mg/dL 1.47    Edinburgh Score:    02/10/2023    6:30 AM  Edinburgh Postnatal Depression Scale Screening Tool  I have been able to laugh and see the funny side of things. 1  I have looked forward with enjoyment to things. 1  I have blamed myself unnecessarily when things went wrong. 2  I have been anxious or worried for no good reason. 2  I have felt scared or panicky for no good reason. 0  Things have been getting on top of me. 1  I have been so unhappy that I have had difficulty sleeping. 0  I have felt sad or miserable. 1  I have been so unhappy that I have been crying. 1  The thought of harming myself has occurred to me. 0  Edinburgh Postnatal Depression Scale Total 9     After visit meds:  Allergies as of 02/11/2023       Reactions   Hydrocodone Other (See Comments)   Other Reaction: SEVERE HA & GI Upset   Oxycodone Other (See Comments)   Other Reaction: SEVERE HA & GI Upset   Tape Rash        Medication List     STOP taking these medications    aspirin EC 81 MG tablet   cyanocobalamin 1000 MCG tablet Commonly known as: VITAMIN B12   cyclobenzaprine 10 MG tablet Commonly known as: FLEXERIL   Ferric Maltol 30 MG Caps   ferrous sulfate 325 (65 FE) MG tablet Commonly known as: FerrouSul   guaiFENesin 100 MG/5ML liquid Commonly known as: ROBITUSSIN    terconazole 0.4 % vaginal cream Commonly known as: TERAZOL 7   Vitamin D 50 MCG (2000 UT) Caps       TAKE these medications    FLUoxetine 20 MG capsule Commonly known as: PROzac Take 1 capsule (20 mg total) by mouth daily.   furosemide 20 MG tablet Commonly known as: LASIX Take 1 tablet (20 mg total) by mouth daily.   hydrOXYzine 25 MG tablet Commonly known as: ATARAX Take 1 tablet (25 mg total) by mouth every 6 (six) hours as needed for anxiety.   ibuprofen 600 MG tablet Commonly known as: ADVIL Take 1 tablet (600 mg total) by mouth every 6 (six) hours.         Discharge home in stable condition Infant Feeding: Breast Infant Disposition:home with mother Discharge instruction: per After Visit Summary and Postpartum booklet. Activity: Advance as tolerated. Pelvic rest for 6 weeks.  Diet: routine diet Future Appointments: Future Appointments  Date Time Provider Department Center  02/15/2023  2:50 PM [facility] NURSE [facility]  03/22/2023  1:50 PM Reva Bores, MD [facility]   Follow up Visit: Message sent to [facility] 4/24  Please schedule this patient for a In person postpartum visit in 6 weeks with the following provider: Any provider. Additional Postpartum F/U:Postpartum Depression checkup and Incision check 1 week  High risk pregnancy complicated by:  Obesity, depression,  Delivery mode:  C-Section, Low Transverse  Anticipated Birth Control:  BTL done PP   02/11/2023 Donette Larry, CNM

## 2023-02-08 NOTE — Op Note (Addendum)
[name] PROCEDURE DATE: 02/08/2023  PREOPERATIVE DIAGNOSES: Intrauterine pregnancy at [redacted]w[redacted]d weeks gestation;  elective repeat  POSTOPERATIVE DIAGNOSES: The same, viable infant delivered  PROCEDURE: Repeat Low Transverse Cesarean Section with bilateral salpingectomy  SURGEON:  Dr. Karyl Kinnier  ASSISTANT:  Myrtie Hawk, DO An experienced assistant was required given the standard of surgical care given the complexity of the case.  This assistant was needed for exposure, dissection, suctioning, retraction, instrument exchange, assisting with delivery with administration of fundal pressure, and for overall help during the procedure.  ANESTHESIOLOGY TEAM: Anesthesiologist: Shelton Silvas, MD CRNA: Elbert Ewings, CRNA  INDICATIONS: Kimberly Farmer is a 27 y.o. [id number] at [redacted]w[redacted]d here for cesarean section secondary to the indications listed under preoperative diagnoses; please see preoperative note for further details.  The risks of surgery were discussed with the patient including but were not limited to: bleeding which may require transfusion or reoperation; infection which may require antibiotics; injury to bowel, bladder, ureters or other surrounding organs; injury to the fetus; need for additional procedures including hysterectomy in the event of a life-threatening hemorrhage; formation of adhesions; placental abnormalities wth subsequent pregnancies; incisional problems; thromboembolic phenomenon and other postoperative/anesthesia complications.  The patient concurred with the proposed plan, giving informed written consent for the procedure.    FINDINGS:  Viable female infant in cephalic presentation.  Apgars 8 and 9.  Amniotic fluid: clear.  Intact placenta, three vessel cord.  Normal uterus, fallopian tubes and ovaries bilaterally. There was a paratubal cyst on the left that was removed with the salpingectomy   ANESTHESIA: spinal INTRAVENOUS FLUIDS: 1700 ml   ESTIMATED  BLOOD LOSS: 430 ml URINE OUTPUT:  50 ml SPECIMENS: Placenta sent to L&D ., Right and left fallopian tubes COMPLICATIONS: None immediate  PROCEDURE IN DETAIL:  The patient preoperatively received intravenous antibiotics and had sequential compression devices applied to her lower extremities.  She was then taken to the operating room where spinal anesthesia was found to be adequate. She was then placed in a dorsal supine position with a leftward tilt, and prepped and draped in a sterile manner.  A foley catheter was  placed into her bladder and attached to constant gravity.  After an adequate timeout was performed, a Pfannenstiel skin incision was made with scalpel and carried through to the underlying layer of fascia. The fascia was incised in the midline, and this incision was extended sharply with mayo scissors. The rectus muscles were separated in the midline and the peritoneum was entered bluntly.   The Alexis self-retaining retractor was introduced into the abdominal cavity.  Attention was turned to the lower uterine segment where a low transverse hysterotomy was made with a scalpel and extended bluntly in caudad and cephalad directions.  The infant was successfully delivered, the cord was clamped and cut after one minute, and the infant was handed over to the awaiting neonatology team. Uterine massage was then administered, and the placenta delivered intact with a three-vessel cord. The uterus was then cleared of clots and debris.  The hysterotomy was closed with 0-Monocryl in a running fashion.  Figure-of-eight 0 monocryl serosal stitches were placed to help with hemostasis.    Attention was then turned to the patient's uterus, and right fallopian tube was identified and followed out to the fimbriated end.  Ligasure device was used to cauterize and cut the mesosalpinx to proximal end of the fallopian tube, removing 6cm of tube. A similar process was carried out on the left side  allowing for  bilateral tubal sterilization.   The pelvis was cleared of all clot and debris. Hemostasis was confirmed on all surfaces.  The uterus was once again inspected and found to be hemostatic. The retractor was removed.  The peritoneum was closed with a 2-0 Vicryl. The fascia was then closed using 0 Vicryl in a running fashion.  The subcutaneous layer was irrigated, any areas of bleeding were cauterized with the bovie,  was reapproximated with 2-0 plain gut in a running fashion, was found to be hemostatic.Marland KitchenMarland KitchenThe skin was closed with a 4-0 Vicryl subcuticular stitch. The patient tolerated the procedure well. Sponge, instrument and needle counts were correct x 3.  She was taken to the recovery room in stable condition.   Myrtie Hawk, DO FMOB Fellow, Faculty practice [facility], Center for [facility] 02/08/23  12:28 PM   Attestation of Attending Supervision of OB Fellow: Evaluation and management procedures were performed by the Family Medicine OB Fellow under my supervision.  I have reviewed the Fellow's note and chart. I was gloved and gowned for the entirety of the procedure and involved during the case. I have made any necessary editorial changes.   Federico Flake, MD, MPH, ABFM Attending Physician Center for [facility]

## 2023-02-08 NOTE — Anesthesia Preprocedure Evaluation (Addendum)
Anesthesia Evaluation  Patient identified by MRN, date of birth, ID band Patient awake    Reviewed: Allergy & Precautions, NPO status , Patient's Chart, lab work & pertinent test results  Airway Mallampati: I  TM Distance: >3 FB Neck ROM: Full    Dental  (+) Teeth Intact, Dental Advisory Given   Pulmonary    breath sounds clear to auscultation       Cardiovascular hypertension,  Rhythm:Regular Rate:Normal     Neuro/Psych  Headaches PSYCHIATRIC DISORDERS Anxiety Depression       GI/Hepatic negative GI ROS,,,  Endo/Other    Renal/GU      Musculoskeletal  (+) Arthritis ,    Abdominal   Peds  Hematology   Anesthesia Other Findings   Reproductive/Obstetrics (+) Pregnancy                             Anesthesia Physical Anesthesia Plan  ASA: 3  Anesthesia Plan: Spinal   Post-op Pain Management:    Induction:   PONV Risk Score and Plan: 0 and Ondansetron  Airway Management Planned: Natural Airway  Additional Equipment: None  Intra-op Plan:   Post-operative Plan:   Informed Consent: I have reviewed the patients History and Physical, chart, labs and discussed the procedure including the risks, benefits and alternatives for the proposed anesthesia with the patient or authorized representative who has indicated his/her understanding and acceptance.       Plan Discussed with:   Anesthesia Plan Comments: (Lab Results      Component                Value               Date                      WBC                      9.3                 02/06/2023                HGB                      11.0 (L)            02/06/2023                HCT                      36.3                02/06/2023                MCV                      76.3 (L)            02/06/2023                PLT                      335                 02/06/2023           )       Anesthesia Quick Evaluation

## 2023-02-08 NOTE — Anesthesia Procedure Notes (Signed)
Spinal  Start time: 02/08/2023 9:36 AM End time: 02/08/2023 9:39 AM Reason for block: surgical anesthesia Staffing Performed: anesthesiologist  Anesthesiologist: Shelton Silvas, MD Performed by: Shelton Silvas, MD Authorized by: Shelton Silvas, MD   Preanesthetic Checklist Completed: patient identified, IV checked, site marked, risks and benefits discussed, surgical consent, monitors and equipment checked, pre-op evaluation and timeout performed Spinal Block Patient position: sitting Prep: DuraPrep and site prepped and draped Location: L3-4 Injection technique: single-shot Needle Needle type: Pencan  Needle gauge: 24 G Needle length: 10 cm Needle insertion depth: 10 cm Additional Notes Patient tolerated well. No immediate complications.  Functioning IV was confirmed and monitors were applied. Sterile prep and drape, including hand hygiene and sterile gloves were used. The patient was positioned and the back was prepped. The skin was anesthetized with lidocaine. Free flow of clear CSF was obtained prior to injecting local anesthetic into the CSF. The spinal needle aspirated freely following injection. The needle was carefully withdrawn. The patient tolerated the procedure well.

## 2023-02-09 ENCOUNTER — Encounter (HOSPITAL_COMMUNITY): Payer: Self-pay | Admitting: Family Medicine

## 2023-02-09 LAB — CBC
HCT: 30 % — ABNORMAL LOW (ref 36.0–46.0)
Hemoglobin: 8.9 g/dL — ABNORMAL LOW (ref 12.0–15.0)
MCH: 23.1 pg — ABNORMAL LOW (ref 26.0–34.0)
MCHC: 29.7 g/dL — ABNORMAL LOW (ref 30.0–36.0)
MCV: 77.7 fL — ABNORMAL LOW (ref 80.0–100.0)
Platelets: 259 10*3/uL (ref 150–400)
RBC: 3.86 MIL/uL — ABNORMAL LOW (ref 3.87–5.11)
RDW: 19.5 % — ABNORMAL HIGH (ref 11.5–15.5)
WBC: 9.1 10*3/uL (ref 4.0–10.5)
nRBC: 0 % (ref 0.0–0.2)

## 2023-02-09 MED ORDER — FERROUS SULFATE 325 (65 FE) MG PO TABS
325.0000 mg | ORAL_TABLET | ORAL | Status: DC
  Administered 2023-02-09 – 2023-02-11 (×2): 325 mg via ORAL
  Filled 2023-02-09 (×2): qty 1

## 2023-02-09 NOTE — Progress Notes (Signed)
POSTPARTUM PROGRESS NOTE  POD #1  Subjective:  Kimberly Farmer is a 27 y.o. [id number] s/p rLTCS at [redacted]w[redacted]d.  She reports she doing well. No acute events overnight. She reports she is doing well. She denies any problems with ambulating, voiding or po intake. Denies nausea or vomiting. She has not passed flatus. Pain is well controlled.  Lochia is appropriate .  Objective: Blood pressure 122/70, pulse 64, temperature 98.2 F (36.8 C), temperature source Oral, resp. rate 18, last menstrual period 05/15/2022, SpO2 99 %, unknown if currently breastfeeding.  Physical Exam:  General: alert, cooperative and no distress Chest: no respiratory distress Heart:regular rate, distal pulses intact Abdomen: soft, nontender,  Uterine Fundus: firm, appropriately tender DVT Evaluation: No calf swelling or tenderness Extremities: trace edema Skin: warm, dry; incision clean/dry/intact w/ pressure dressing in place  Recent Labs    02/06/23 0947 02/09/23 0412  HGB 11.0* 8.9*  HCT 36.3 30.0*    Assessment/Plan: Kimberly Farmer is a 27 y.o. [id number] s/p rLTCS at [redacted]w[redacted]d.  POD#1 - Doing welll; pain well controlled. H/H appropriate  Routine postpartum care  OOB, ambulated  Lovenox for VTE prophylaxis Anemia: asymptomatic  Start po ferrous sulfate BID  Contraception: BTS done Feeding: Breast  Dispo: Plan for discharge tomorrow or the next day .   LOS: 1 day   Derrel Nip, MD  OB Fellow  02/09/2023, 8:06 AM

## 2023-02-09 NOTE — Lactation Note (Addendum)
This note was copied from a baby's chart. Lactation Consultation Note  Patient Name: Kimberly Farmer ZOXWR'U Date: 02/09/2023 Age:27 hours Reason for consult: Initial assessment;Maternal endocrine disorder (See Birth Parent's MR: Repeat C/S, PCOS, GHTN)  Per Birth Parent, infant finished BF for 1 hour prior to [facility] entering the room, was still cuing Birth Parent is concern she doesn't have enough milk and she is tired not had any sleep today, infant is feeding for long periods. LC did not see latch, Birth Parent was set up with DEBP earlier today by RN. Birth Parent very tired and infant was still cuing , Birth Parent used DEBP  when [facility] was in the room and expressed 8 mls of colostrum that was given to infant by curve tip syringe. Birth Parent does not want to use a bottle want to exclusively BF infant. Infant appeared content afterwards and Support Person changed void diaper. Birth Parent discard 8 mls she had previously pumped due being past 4 hours that was sitting on the counter when LC entered the room. LC informed Birth Parent she has good milk supply. LC unable to assess infant's latch due Birth Parent BF infant for 1 hour and Birth Parent is very tired. LC discussed the importance of maternal rest, diet and hydration. Birth Parent was  made aware of O/P services, breastfeeding support groups, community resources, and our phone # for post-discharge questions.    LC suggested to continue to BF infant by cues on demand, 8 to 12+ times within 24 hours but do not BF for 1 hour per feeding  on day 1. Latch infant 30-40  minutes and afterwards continue to use DEBP and give infant back EBM so Birth Parent can rest. Birth Parent was given BF supplement guidelines. LC encouraged Birth Parent to offer infant after latching infant at the breast any EBM that she express from pumping.   Maternal Data Has patient been taught Hand Expression?: Yes Does the patient have breastfeeding experience prior to this  delivery?: Yes How long did the patient breastfeed?: Per Birth Parent, She exclusively pumped 1 year with 1st and 3rd child, had latch difficulties but with 2nd child she BF for 2 years. Birth Parent doesn't want introduce bottle wants to exclusively BF infant.  Feeding Mother's Current Feeding Choice: Breast Milk  LATCH Score  LC did not observe latch.                  Lactation Tools Discussed/Used Tools: Pump;Flanges Flange Size: 24 Breast pump type: Double-Electric Breast Pump Pump Education: Setup, frequency, and cleaning;Milk Storage Reason for Pumping: Birth Parent was set up earlier today with DEBP by RN, hx PCOS and C/S delivery Pumping frequency: Birth Parent plans to continue to use DEBP every 3 hours for 15 minutes on inital setting. Pumped volume: 8 mL  Interventions Interventions: Breast feeding basics reviewed;Position options;Skin to skin;Education;Breast compression;LC Services brochure  Discharge Pump: DEBP (Per Birth Parent, she has DEBP at home.)  Consult Status Consult Status: Follow-up Date: 02/09/23 Follow-up type: In-patient    Frederico Hamman 02/09/2023, 3:33 AM

## 2023-02-09 NOTE — Lactation Note (Signed)
This note was copied from a baby's chart. Lactation Consultation Note  Patient Name: Kimberly Farmer ZOXWR'U Date: 02/09/2023 Age:27 hours  Reason for consult: Follow-up assessment;Term;Breastfeeding assistance;Infant weight loss  P4, C/S, GA [redacted]w[redacted]d, 7% weight loss  Upon entry to room mother and father are making efforts to wake baby to feed. According to mother, "[name]" isn't wanting to eat and she is concerned. Mother appears very tired and father of baby is very helpful and supportive with working with baby to entice feedings.   Mother receptive for assistance with latching baby. Observed mother latch baby and he was shallow on the breast mostly due to sleepiness. Pillows added for support and to get baby aligned with the breast. Mother can easily express colostrum.  Assisted mother to re-latched baby 2-3 times before he had more depth on the breast. Denies discomfort with this latch. Infant had bursts of swallows but he was mostly sleepy during the feeding. Infant is being supplemented with formula and EBM until he is actively feeding at the breast. Dad will give mother's EBM 4 ml after breastfeeding by finger feeding/ curved tip syringe.   Encouraged mother to continue to latch with feeding cues, pump to supplement and give formula as based on infants feeding effort. Encouraged mother to also rest, eat and drink. Father/ support person is very attentive and engaged in mother/infant care.    Maternal Data Has patient been taught Hand Expression?: Yes Does the patient have breastfeeding experience prior to this delivery?: Yes  Feeding Mother's Current Feeding Choice: Breast Milk and Formula  LATCH Score Latch: Repeated attempts needed to sustain latch, nipple held in mouth throughout feeding, stimulation needed to elicit sucking reflex.  Audible Swallowing: A few with stimulation  Type of Nipple: Everted at rest and after stimulation  Comfort (Breast/Nipple): Soft /  non-tender  Hold (Positioning): Assistance needed to correctly position infant at breast and maintain latch.  LATCH Score: 7   Lactation Tools Discussed/Used Tools: Pump Flange Size: 24 Breast pump type: Double-Electric Breast Pump Pumped volume: 4 mL  Interventions Interventions: Breast feeding basics reviewed;Assisted with latch;Breast massage;Breast compression;Adjust position;Support pillows;Education   Consult Status Consult Status: Follow-up Date: 02/10/23 Follow-up type: In-patient    Christella Hartigan M 02/09/2023, 4:01 PM

## 2023-02-10 DIAGNOSIS — D62 Acute posthemorrhagic anemia: Secondary | ICD-10-CM | POA: Diagnosis not present

## 2023-02-10 MED ORDER — FUROSEMIDE 20 MG PO TABS
20.0000 mg | ORAL_TABLET | Freq: Every day | ORAL | Status: DC
  Administered 2023-02-10 – 2023-02-11 (×2): 20 mg via ORAL
  Filled 2023-02-10 (×2): qty 1

## 2023-02-10 NOTE — Progress Notes (Signed)
POSTPARTUM PROGRESS NOTE  POD #2  Subjective:  Kimberly Farmer is a 27 y.o. [id number] s/p rLTCS at [redacted]w[redacted]d.  She reports she doing well. No acute events overnight. She reports she is doing well. She denies any problems with ambulating, voiding or po intake. Denies nausea or vomiting. She has passed flatus. Pain is well controlled.  Lochia is appropriate .  Objective: Blood pressure (!) 132/58, pulse 82, temperature 98 F (36.7 C), resp. rate 16, last menstrual period 05/15/2022, SpO2 99 %, unknown if currently breastfeeding. Patient Vitals for the past 24 hrs:  BP Temp Temp src Pulse Resp SpO2  02/10/23 0316 (!) 132/58 98 F (36.7 C) -- 82 16 99 %  02/09/23 2100 132/70 98.5 F (36.9 C) Oral 87 18 98 %  02/09/23 1620 132/80 98.8 F (37.1 C) Oral 92 18 100 %    Physical Exam:  General: alert, cooperative and no distress Chest: no respiratory distress Heart:regular rate, distal pulses intact Abdomen: soft, nontender,  Uterine Fundus: firm, appropriately tender DVT Evaluation: No calf swelling or tenderness Extremities: trace edema Skin: warm, dry; incision clean/dry/intact w/ pressure dressing in place  Recent Labs    02/09/23 0412  HGB 8.9*  HCT 30.0*   Assessment/Plan: Kimberly Farmer is a 27 y.o. [id number] POD#2 s/p rLTCS at [redacted]w[redacted]d.  POD#1 - Doing welll; pain well controlled. Routine postpartum care  OOB, ambulated  Lovenox for VTE prophylaxis SBPs in low 130s - History of GHTN. Lasix 20 mg daily started.  Monitor BP.  Anemia: asymptomatic  Start po ferrous sulfate BID Contraception: BTS done Feeding: Breast  Dispo: Plan for discharge tomorrow    LOS: 2 days   Jaynie Collins, MD, FACOG Obstetrician & Gynecologist, [facility] for [employer], [facility] 02/10/2023, 11:00 AM

## 2023-02-10 NOTE — Lactation Note (Signed)
This note was copied from a baby's chart. Lactation Consultation Note  Patient Name: Kimberly Farmer Date: 02/10/2023 Age:27 hours  Attempted to see mom but she and baby were sleeping. FOB was awake and LC showed him my badge and he whispered they were both asleep.    Maternal Data    Feeding Nipple Type: Slow - flow  LATCH Score                    Lactation Tools Discussed/Used    Interventions    Discharge    Consult Status      Charyl Dancer 02/10/2023, 11:25 PM

## 2023-02-11 MED ORDER — IBUPROFEN 600 MG PO TABS: 600.0000 mg | ORAL_TABLET | Freq: Four times a day (QID) | ORAL | 0 refills | Status: DC

## 2023-02-11 MED ORDER — FUROSEMIDE 20 MG PO TABS: 20.0000 mg | ORAL_TABLET | Freq: Every day | ORAL | 0 refills | Status: DC

## 2023-02-11 NOTE — Lactation Note (Signed)
This note was copied from a baby's chart. Lactation Consultation Note  Patient Name: Kimberly Farmer ZOXWR'U Date: 02/11/2023 Age:27 hours Reason for consult: Follow-up assessment;Term Mom stated BF is going. Mom stated the baby is latching great w/no problems. Mom is supplementing w/formula until her mature milk comes in.  Mom is pumping every 2-3 hrs. Suggested mom pump every 3 hrs. Mom stated yesterday she was only getting 3-4 ml but tonight she pumped and got 20 ml. Praised mom. Experienced BF mom pumped w/one of her children and mom would like to BF this baby because it's going to be her last one.  Praised mom for all of her hard work but encouraged mom to rest when baby is resting at night. Mom looks very tired and sleepy. Since baby is cluster feeding he is wanting to be held so parents are taking turns holding him while he sleeps. Mom is giving the baby the BM before giving the formula. Suggested w/20 mls and BF baby probably doesn't need any formula more than likely. Suggested mom see's how the baby is acting determines whether or not to give the formula. Encouraged mom call for latch score.  Maternal Data    Feeding Mother's Current Feeding Choice: Breast Milk and Formula Nipple Type: Slow - flow  LATCH Score                    Lactation Tools Discussed/Used    Interventions    Discharge    Consult Status Consult Status: Follow-up Date: 02/11/23 Follow-up type: In-patient    Kimberly Farmer 02/11/2023, 3:52 AM

## 2023-02-11 NOTE — Progress Notes (Signed)
Notified by Aundra Millet, RN that patient's BabyScripts app will not allow her to log BPs into app. CNM sent high alert in-basket to [facility] account to see if account can be reset. Patient also given paper by her postpartum RN to assist with logging BPs.   Jabrea Kallstrom, CNM 02/11/23 11:22 AM

## 2023-02-11 NOTE — Progress Notes (Signed)
Pt enrolled in Babyscripts and successfully signed into her app. BP cuff given and pt instructed how to use with successful return demo. Pt unable to enter BP reading into APP despite troubleshooting by RN. Sam Reita Cliche CNM notified and will have office work on issue tomorrow. Will send BP log with pt at discharge.

## 2023-02-11 NOTE — Lactation Note (Signed)
This note was copied from a baby's chart. Lactation Consultation Note  Patient Name: Kimberly Farmer ZOXWR'U Date: 02/11/2023 Age:27 hours Reason for consult: Follow-up assessment  P4, Weight increased.  Mother has pumped 20-30 ml.  Suggest breastfeeding on demand.  Mother concerned about weight loss. Encouraged mother and provided education. Reviewed engorgement care and monitoring voids/stools.  Feeding Mother's Current Feeding Choice: Breast Milk and Formula  Lactation Tools Discussed/Used Tools: Pump Flange Size: 24 Breast pump type: Double-Electric Breast Pump  Interventions Interventions: Education  Discharge Discharge Education: Engorgement and breast care;Warning signs for feeding baby  Consult Status Consult Status: Complete Date: 02/11/23    Dahlia Byes [facility] 02/11/2023, 10:52 AM

## 2023-02-12 ENCOUNTER — Encounter: Payer: Self-pay | Admitting: Obstetrics and Gynecology

## 2023-02-12 LAB — SURGICAL PATHOLOGY

## 2023-02-13 ENCOUNTER — Encounter: Payer: Self-pay | Admitting: Obstetrics & Gynecology

## 2023-02-13 ENCOUNTER — Encounter: Payer: Self-pay | Admitting: Obstetrics and Gynecology

## 2023-02-14 ENCOUNTER — Inpatient Hospital Stay (HOSPITAL_COMMUNITY): Payer: Medicaid Other

## 2023-02-14 ENCOUNTER — Inpatient Hospital Stay (HOSPITAL_COMMUNITY)
Admission: AD | Admit: 2023-02-14 | Discharge: 2023-02-14 | Disposition: A | Discharge status: Home / Self Care | Payer: Medicaid Other | Attending: Obstetrics & Gynecology | Admitting: Obstetrics & Gynecology

## 2023-02-14 DIAGNOSIS — Z98891 History of uterine scar from previous surgery: Secondary | ICD-10-CM | POA: Diagnosis not present

## 2023-02-14 DIAGNOSIS — Z79899 Other long term (current) drug therapy: Secondary | ICD-10-CM | POA: Insufficient documentation

## 2023-02-14 LAB — TYPE AND SCREEN
ABO/RH(D): O POS
Antibody Screen: NEGATIVE

## 2023-02-14 LAB — CBC
HCT: 36.4 % (ref 36.0–46.0)
Hemoglobin: 11 g/dL — ABNORMAL LOW (ref 12.0–15.0)
MCH: 23.4 pg — ABNORMAL LOW (ref 26.0–34.0)
MCHC: 30.2 g/dL (ref 30.0–36.0)
MCV: 77.3 fL — ABNORMAL LOW (ref 80.0–100.0)
Platelets: 436 10*3/uL — ABNORMAL HIGH (ref 150–400)
RBC: 4.71 MIL/uL (ref 3.87–5.11)
RDW: 19.4 % — ABNORMAL HIGH (ref 11.5–15.5)
WBC: 9.8 10*3/uL (ref 4.0–10.5)
nRBC: 0 % (ref 0.0–0.2)

## 2023-02-14 MED ORDER — ACETAMINOPHEN 500 MG PO TABS
1000.0000 mg | ORAL_TABLET | Freq: Once | ORAL | Status: AC
  Administered 2023-02-14: 1000 mg via ORAL
  Filled 2023-02-14: qty 2

## 2023-02-14 MED ORDER — AMOXICILLIN-POT CLAVULANATE 875-125 MG PO TABS: 1.0000 | ORAL_TABLET | Freq: Two times a day (BID) | ORAL | 0 refills | Status: AC

## 2023-02-14 MED ORDER — METHYLERGONOVINE MALEATE 0.2 MG PO TABS: 0.2000 mg | ORAL_TABLET | Freq: Four times a day (QID) | ORAL | 0 refills | Status: AC

## 2023-02-14 NOTE — MAU Provider Note (Signed)
History     [id number]  Arrival date and time: 02/14/23 6644    Chief Complaint  Patient presents with   Vaginal Bleeding     HPI Kimberly Farmer is a 27 y.o. s/p RCS+BTL on 02/08/2023, who presents for vaginal bleeding.   Patient had uncomplicated RCS and BTL on 02/08/2023 She reports that she had stopped bleeding by the time she left the hospital and that this is typical for her after her c-sections Two days ago in the evening she began to pass clots Reports her periods are normally very heavy and have lots of clots However she is breastfeeding and this type of bleeding postpartum is out of the ordinary for her Clots are golf ball sized at their biggest Don't happen constantly, seems to be sporadic No significant pain or cramping beyond normal post op pain  --/--/O POS (05/01 1030)  OB History     Gravida  5   Para  4   Term  4   Preterm  0   AB  1   Living  4      SAB  1   IAB  0   Ectopic  0   Multiple  0   Live Births  4           Past Medical History:  Diagnosis Date   Arthritis    Bilateral knees and back   Breakthrough bleeding on birth control pills 05/16/2018   Cesarean wound infection 03/18/2019   Choroid plexus cysts, fetal, affecting care of mother, antepartum 09/17/2018   Gestational hypertension 03/01/2021   Lumbar disc disease    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   PCOS (polycystic ovarian syndrome)    Post-dates pregnancy 02/21/2019   Pregnancy induced hypertension    Tick bite of left breast 02/24/2022   Yellow eyes 02/08/2022    Past Surgical History:  Procedure Laterality Date   CESAREAN SECTION N/A 02/21/2019   Procedure: CESAREAN SECTION;  Surgeon: Nadara Mustard, MD;  Location: [facility] ORS;  Service: Obstetrics;  Laterality: N/A;   CESAREAN SECTION N/A 03/02/2021   Procedure: CESAREAN SECTION;  Surgeon: Tereso Newcomer, MD;  Location: [facility] ORS;  Service: Obstetrics;  Laterality: N/A;   CESAREAN SECTION WITH BILATERAL  TUBAL LIGATION N/A 02/08/2023   Procedure: CESAREAN SECTION WITH BILATERAL TUBAL LIGATION;  Surgeon: Federico Flake, MD;  Location: [facility] ORS;  Service: Obstetrics;  Laterality: N/A;   KNEE SURGERY Left 2011   [name] has had 5 surgeries on her knee   TONSILLECTOMY AND ADENOIDECTOMY  2007    Family History  Problem Relation Age of Onset   Hypertension Mother    Alcohol abuse Father    Diabetes Maternal Grandmother    Hypertension Maternal Grandmother    Cancer Paternal Grandfather        skin    Social History   Socioeconomic History   Marital status: Single    Spouse name: Not on file   Number of children: Not on file   Years of education: Not on file   Highest education level: Not on file  Occupational History   Not on file  Tobacco Use   Smoking status: Never   Smokeless tobacco: Never   Tobacco comments:    Vape - Reduced Nicotine  Vaping Use   Vaping Use: Former   Substances: Nicotine  Substance and Sexual Activity   Alcohol use: No   Drug use: No   Sexual activity: Not Currently  Partners: Male    Birth control/protection: None  Other Topics Concern   Not on file  Social History Narrative   Not on file   Social Determinants of Health   Financial Resource Strain: Low Risk  (10/25/2021)   Overall Financial Resource Strain (CARDIA)    Difficulty of Paying Living Expenses: Not very hard  Food Insecurity: No Food Insecurity (02/08/2023)   Hunger Vital Sign    Worried About Running Out of Food in the Last Year: Never true    Ran Out of Food in the Last Year: Never true  Transportation Needs: Unmet Transportation Needs (02/08/2023)   PRAPARE - Administrator, Civil Service (Medical): Yes    Lack of Transportation (Non-Medical): No  Physical Activity: Insufficiently Active (01/26/2019)   Exercise Vital Sign    Days of Exercise per Week: 4 days    Minutes of Exercise per Session: 20 min  Stress: Stress Concern Present (10/25/2021)   [employer] - Occupational Stress Questionnaire    Feeling of Stress : Rather much  Social Connections: Unknown (01/26/2019)   Social Connection and Isolation Panel [NHANES]    Frequency of Communication with Friends and Family: Patient declined    Frequency of Social Gatherings with Friends and Family: Patient declined    Attends Religious Services: Patient declined    Database administrator or Organizations: Patient declined    Attends Banker Meetings: Patient declined    Marital Status: Patient declined  Intimate Partner Violence: Not At Risk (02/08/2023)   Humiliation, Afraid, Rape, and Kick questionnaire    Fear of Current or Ex-Partner: No    Emotionally Abused: No    Physically Abused: No    Sexually Abused: No    Allergies  Allergen Reactions   Hydrocodone Other (See Comments)    Other Reaction: SEVERE HA & GI Upset   Oxycodone Other (See Comments)    Other Reaction: SEVERE HA & GI Upset   Tape Rash    Current Facility-Administered Medications on File Prior to Encounter  Medication Dose Route Frequency Provider Last Rate Last Admin   acetaminophen (TYLENOL) tablet 650 mg  650 mg Oral Once Milas Hock, MD       acetaminophen (TYLENOL) tablet 650 mg  650 mg Oral Once Milas Hock, MD       diphenhydrAMINE (BENADRYL) capsule 25 mg  25 mg Oral Once Milas Hock, MD       diphenhydrAMINE (BENADRYL) capsule 25 mg  25 mg Oral Once Milas Hock, MD       iron sucrose (VENOFER) 200 mg in sodium chloride 0.9 % 100 mL IVPB  200 mg Intravenous Once Milas Hock, MD       iron sucrose (VENOFER) 200 mg in sodium chloride 0.9 % 100 mL IVPB  200 mg Intravenous Once Milas Hock, MD       Current Outpatient Medications on File Prior to Encounter  Medication Sig Dispense Refill   FLUoxetine (PROZAC) 20 MG capsule Take 1 capsule (20 mg total) by mouth daily. 30 capsule 4   furosemide (LASIX) 20 MG tablet Take 1 tablet (20 mg total) by mouth daily. 3  tablet 0   hydrOXYzine (ATARAX) 25 MG tablet Take 1 tablet (25 mg total) by mouth every 6 (six) hours as needed for anxiety. (Patient not taking: Reported on 12/15/2022) 30 tablet 2   ibuprofen (ADVIL) 600 MG tablet Take 1 tablet (600 mg total) by mouth every 6 (six)  hours. 30 tablet 0     ROS Pertinent positives and negative per HPI, all others reviewed and negative  Physical Exam   BP 125/72 (BP Location: Left Arm)   Pulse (!) 112   Temp 98.4 F (36.9 C) (Oral)   Resp 16   SpO2 99%   Breastfeeding Yes   Patient Vitals for the past 24 hrs:  BP Temp Temp src Pulse Resp SpO2  02/14/23 0928 125/72 98.4 F (36.9 C) Oral (!) 112 16 99 %    Physical Exam Vitals reviewed.  Constitutional:      General: She is not in acute distress.    Appearance: She is well-developed. She is not diaphoretic.  Eyes:     General: No scleral icterus. Pulmonary:     Effort: Pulmonary effort is normal. No respiratory distress.  Abdominal:     General: There is no distension.     Palpations: Abdomen is soft.     Tenderness: There is no abdominal tenderness. There is no guarding or rebound.  Skin:    General: Skin is warm and dry.  Neurological:     Mental Status: She is alert.     Coordination: Coordination normal.      Cervical Exam    Bedside Ultrasound Not performed.  My interpretation: n/a  FHT N/a  Labs Results for orders placed or performed during the hospital encounter of 02/14/23 (from the past 24 hour(s))  Type and screen [facility]     Status: None   Collection Time: 02/14/23 10:30 AM  Result Value Ref Range   ABO/RH(D) O POS    Antibody Screen NEG    Sample Expiration      02/17/2023,2359 Performed at [facility], [address] [place], [place] [address]   CBC     Status: Abnormal   Collection Time: 02/14/23 10:31 AM  Result Value Ref Range   WBC 9.8 4.0 - 10.5 K/uL   RBC 4.71 3.87 - 5.11 MIL/uL   Hemoglobin 11.0 (L) 12.0 - 15.0 g/dL    HCT 84.1 32.4 - 40.1 %   MCV 77.3 (L) 80.0 - 100.0 fL   MCH 23.4 (L) 26.0 - 34.0 pg   MCHC 30.2 30.0 - 36.0 g/dL   RDW 02.7 (H) 25.3 - 66.4 %   Platelets 436 (H) 150 - 400 K/uL   nRBC 0.0 0.0 - 0.2 %    Imaging US Pelvis Limited  Result Date: 02/14/2023 CLINICAL DATA:  Six days postpartum with postpartum bleeding. Evaluate for retained products. EXAM: TRANSABDOMINAL ULTRASOUND OF PELVIS TECHNIQUE: Transabdominal ultrasound examination of the pelvis was performed including evaluation of the uterus, ovaries, adnexal regions, and pelvic cul-de-sac. COMPARISON:  None Available. FINDINGS: Uterus Measurements: 20.0 x 5.5 x 12.0 cm = volume: 815 mL. No myometrial abnormalities. No obvious hematoma at the C-section site. Endometrium Thickness: 19 mm. Heterogeneous material in the endometrial canal with areas of cystic change. No abnormal blood flow is demonstrated but findings consistent with retained products. Right ovary Measurements: 4.5 x 3.0 x 4.7 cm = volume: 3.3 mL. Normal appearance/no adnexal mass. Left ovary Measurements: 3.5 x 2.0 x 3.0 cm = volume: 11.5 mL. Normal appearance/no adnexal mass. Other findings:  No abnormal free fluid. IMPRESSION: 1. Heterogeneous material in the widened endometrial canal with areas of cystic change but no abnormal blood flow. Findings consistent with retained products of conception. 2. Normal ovaries. Electronically Signed   By: Rudie Meyer M.D.   On: 02/14/2023  11:15    MAU Course  Procedures Lab Orders         CBC    Meds ordered this encounter  Medications   methylergonovine (METHERGINE) 0.2 MG tablet    Sig: Take 1 tablet (0.2 mg total) by mouth 4 (four) times daily for 3 days.    Dispense:  12 tablet    Refill:  0   amoxicillin-clavulanate (AUGMENTIN) 875-125 MG tablet    Sig: Take 1 tablet by mouth every 12 (twelve) hours for 5 days.    Dispense:  10 tablet    Refill:  0   Imaging Orders         US Pelvis Limited     MDM Moderate (Level  3-4)  Assessment and Plan  #Postpartum bleeding after cesarean Hgb stable, recovered from delivery. Discussed [place] with Dr. Despina Hidden and reviewed at length, he is confident this is decidualized endometrium and that the US findings do NOT constitute retained POC's. Recommend uterotonics x3 days, Augmentin x5 days for possible subclinical endometritis. Rx sent to pharmacy. Patient has follow up visit tomorrow with [facility] office.    Dispo: discharged to home in stable condition    Venora Maples, MD/MPH 02/14/23 12:06 PM  Allergies as of 02/14/2023       Reactions   Hydrocodone Other (See Comments)   Other Reaction: SEVERE HA & GI Upset   Oxycodone Other (See Comments)   Other Reaction: SEVERE HA & GI Upset   Tape Rash        Medication List     TAKE these medications    amoxicillin-clavulanate 875-125 MG tablet Commonly known as: AUGMENTIN Take 1 tablet by mouth every 12 (twelve) hours for 5 days.   FLUoxetine 20 MG capsule Commonly known as: PROzac Take 1 capsule (20 mg total) by mouth daily.   furosemide 20 MG tablet Commonly known as: LASIX Take 1 tablet (20 mg total) by mouth daily.   hydrOXYzine 25 MG tablet Commonly known as: ATARAX Take 1 tablet (25 mg total) by mouth every 6 (six) hours as needed for anxiety.   ibuprofen 600 MG tablet Commonly known as: ADVIL Take 1 tablet (600 mg total) by mouth every 6 (six) hours.   methylergonovine 0.2 MG tablet Commonly known as: METHERGINE Take 1 tablet (0.2 mg total) by mouth 4 (four) times daily for 3 days.

## 2023-02-14 NOTE — MAU Note (Addendum)
...  Kimberly Farmer is a 27 y.o. at 6 days post partum C/S here in MAU reporting: Passing large blood clots since yesterday. She reports she had completely stopped experiencing vaginal bleeding before she left the hospital on 4/28. She reports she is not experiencing vaginal bleeding in between passing blood clots. She reports the smallest clots are golf ball sized and the larger clots are around 4-5 inches and are "stringy and banana shaped." Endorses occasional lower abdominal cramping as well as occasional discomfort around her C/S scar. Denies vaginal odors. Denies fever at home.   PP acute blood loss anemia.  She reports she was going to come yesterday but did not have child care. Has not taken any pain medication since being discharged.   Onset of complaint: x2 days Pain score: 1/10 lower abdomen  Lab orders placed from triage:  none

## 2023-02-14 NOTE — Discharge Instructions (Signed)
You were seen in the MAU for vaginal bleeding. We did an ultrasound that shows there is some blood in your uterus. We have sent a medication called ** to help your uterus contract and expel the blood. We have also sent an antibiotic called Augmentin to help prevent an infection. Please make sure to follow up with your regular OB office tomorrow for your previously scheduled appointment.   Return to the MAU if you develop worsening abdominal pain, heavy vaginal bleeding soaking >1 pad/hour, or fever.

## 2023-02-15 ENCOUNTER — Ambulatory Visit: Payer: Medicaid Other

## 2023-02-20 ENCOUNTER — Telehealth (HOSPITAL_COMMUNITY): Payer: Self-pay

## 2023-02-20 NOTE — Telephone Encounter (Signed)
Patient reports that she is doing fine. Patient declines questions/concerns about her health and healing.  Patient reports that baby is doing well.  Baby sleeps in a bedside bassinet. RN reviewed ABC's of safe sleep with patient. Patient declines any questions or concerns about baby.  EPDS unable to complete at this time.  It is not a good time for patient and she would like a call back later.  Suann Larry [facility]  Perinatal Services   02/20/23,1320

## 2023-02-21 ENCOUNTER — Telehealth (HOSPITAL_COMMUNITY): Payer: Self-pay | Admitting: *Deleted

## 2023-02-21 NOTE — Telephone Encounter (Signed)
Nurse called to repeat EDPS, but mom is very busy with her family so test not completed at this time. She reports good support and feeling well emotionally. Discussed doing self-test online and contacting OB if any emotional issues noted.   Duffy Rhody, RN 02-21-2023 at 9:18am

## 2023-03-06 ENCOUNTER — Telehealth (INDEPENDENT_AMBULATORY_CARE_PROVIDER_SITE_OTHER): Payer: Medicaid Other | Admitting: Obstetrics and Gynecology

## 2023-03-06 ENCOUNTER — Encounter: Payer: Self-pay | Admitting: Obstetrics and Gynecology

## 2023-03-06 DIAGNOSIS — N61 Mastitis without abscess: Secondary | ICD-10-CM | POA: Diagnosis not present

## 2023-03-06 MED ORDER — DICLOXACILLIN SODIUM 500 MG PO CAPS: 500.0000 mg | ORAL_CAPSULE | Freq: Four times a day (QID) | ORAL | 0 refills | Status: DC

## 2023-03-06 NOTE — Progress Notes (Signed)
Virtual visit possible mastitis.   CC: pt reports area is warm to the touch , painful, and red on bottom of left breast.  Baby will not nurse from left breast.  Pt notes due to schedule with other children it has been hard to pump on a timed schedule.  No fever, notes chills yet unsure if it's from anemia  Taking Ibp for pain.

## 2023-03-06 NOTE — Progress Notes (Signed)
GYNECOLOGY VIRTUAL VISIT ENCOUNTER NOTE  Provider location: [facility] for [facility]   Patient location: Home  I connected with Kimberly Farmer on 03/06/23 at  8:35 AM EDT by MyChart Video Encounter and verified that I am speaking with the correct person using two identifiers.   I discussed the limitations, risks, security and privacy concerns of performing an evaluation and management service virtually and the availability of in person appointments. I also discussed with the patient that there may be a patient responsible charge related to this service. The patient expressed understanding and agreed to proceed.   History:  Kimberly Farmer is a 27 y.o. [id number] female being evaluated today for evaluation of left breast pain. Patient reports having a tender to touch and red painful breast for the past few days. She reports an inability to pump on a timed schedule due to her other household responsibilities. She states that her newborn will not nurse from that side either. She denies fever or chills. She denies any other concerns.       Past Medical History:  Diagnosis Date   Arthritis    Bilateral knees and back   Breakthrough bleeding on birth control pills 05/16/2018   Cesarean wound infection 03/18/2019   Choroid plexus cysts, fetal, affecting care of mother, antepartum 09/17/2018   Gestational hypertension 03/01/2021   Lumbar disc disease    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   PCOS (polycystic ovarian syndrome)    Post-dates pregnancy 02/21/2019   Pregnancy induced hypertension    Tick bite of left breast 02/24/2022   Yellow eyes 02/08/2022   Past Surgical History:  Procedure Laterality Date   CESAREAN SECTION N/A 02/21/2019   Procedure: CESAREAN SECTION;  Surgeon: Nadara Mustard, MD;  Location: [facility] ORS;  Service: Obstetrics;  Laterality: N/A;   CESAREAN SECTION N/A 03/02/2021   Procedure: CESAREAN SECTION;  Surgeon: Tereso Newcomer, MD;  Location: [facility]  LD ORS;  Service: Obstetrics;  Laterality: N/A;   CESAREAN SECTION WITH BILATERAL TUBAL LIGATION N/A 02/08/2023   Procedure: CESAREAN SECTION WITH BILATERAL TUBAL LIGATION;  Surgeon: Federico Flake, MD;  Location: [facility] LD ORS;  Service: Obstetrics;  Laterality: N/A;   KNEE SURGERY Left 2011   [name] has had 5 surgeries on her knee   TONSILLECTOMY AND ADENOIDECTOMY  2007   The following portions of the patient's history were reviewed and updated as appropriate: allergies, current medications, past family history, past medical history, past social history, past surgical history and problem list.   Health Maintenance:  Normal pap and negative HRHPV on 02/2022.    Review of Systems:  Pertinent items noted in HPI and remainder of comprehensive ROS otherwise negative.  Physical Exam:   General:  Alert, oriented and cooperative. Patient appears to be in no acute distress.  Mental Status: Normal mood and affect. Normal behavior. Normal judgment and thought content.   Respiratory: Normal respiratory effort, no problems with respiration noted  Rest of physical exam deferred due to type of encounter Patient is driving and unable to show affected breast  Labs and Imaging No results found for this or any previous visit (from the past 336 hour(s)). US Pelvis Limited  Result Date: 02/14/2023 CLINICAL DATA:  Six days postpartum with postpartum bleeding. Evaluate for retained products. EXAM: TRANSABDOMINAL ULTRASOUND OF PELVIS TECHNIQUE: Transabdominal ultrasound examination of the pelvis was performed including evaluation of the uterus, ovaries, adnexal regions, and pelvic cul-de-sac. COMPARISON:  None Available. FINDINGS: Uterus Measurements: 20.0  x 5.5 x 12.0 cm = volume: 815 mL. No myometrial abnormalities. No obvious hematoma at the C-section site. Endometrium Thickness: 19 mm. Heterogeneous material in the endometrial canal with areas of cystic change. No abnormal blood flow is demonstrated but  findings consistent with retained products. Right ovary Measurements: 4.5 x 3.0 x 4.7 cm = volume: 3.3 mL. Normal appearance/no adnexal mass. Left ovary Measurements: 3.5 x 2.0 x 3.0 cm = volume: 11.5 mL. Normal appearance/no adnexal mass. Other findings:  No abnormal free fluid. IMPRESSION: 1. Heterogeneous material in the widened endometrial canal with areas of cystic change but no abnormal blood flow. Findings consistent with retained products of conception. 2. Normal ovaries. Electronically Signed   By: Rudie Meyer M.D.   On: 02/14/2023 11:15       Assessment and Plan:     1. Mastitis Rx dicloxacillin has been e-prescribed Encouraged patient to pump as often as she can.  Precautions reviewed and reasons to return to the office discussed with the patient in order to prevent or identify early a breast abscess       I discussed the assessment and treatment plan with the patient. The patient was provided an opportunity to ask questions and all were answered. The patient agreed with the plan and demonstrated an understanding of the instructions.   The patient was advised to call back or seek an in-person evaluation/go to the ED if the symptoms worsen or if the condition fails to improve as anticipated.  I provided 15 minutes of face-to-face time during this encounter.   Catalina Antigua, MD Center for [employer], [facility]

## 2023-03-22 ENCOUNTER — Ambulatory Visit: Payer: Medicaid Other | Admitting: Family Medicine

## 2023-03-22 ENCOUNTER — Encounter: Payer: Self-pay | Admitting: Family Medicine

## 2023-03-22 NOTE — Progress Notes (Signed)
Patient did not keep appointment today. She will be called to reschedule.  

## 2023-05-23 ENCOUNTER — Encounter: Payer: Self-pay | Admitting: Family Medicine

## 2023-06-07 ENCOUNTER — Encounter: Payer: Self-pay | Admitting: Family Medicine

## 2023-06-07 ENCOUNTER — Ambulatory Visit: Payer: Self-pay

## 2023-06-07 NOTE — Telephone Encounter (Signed)
Chief Complaint: Medication reaction Symptoms: Feels like a lump in throat, increased saliva, feels like a weight  Frequency: Onset x 3 days, worse today after taking medication Pertinent Negatives: Patient denies difficulty breathing Disposition: [x] ED /[] Urgent Care (no appt availability in office) / [] Appointment(In office/virtual)/ []  [place] [facility]/ [] Home Care/ [] Refused Recommended Disposition /[] [facility]/ []  Follow-up with PCP Additional Notes: Patient says she's been taking fluoxetine for 1.5-2 weeks now and that she noticed the past 3 days about 10-15 minutes after taking the medication, she has a lump in her throat as if the pill didn't go down with tightness in the throat. She says today seems worse than the past 2 days. She says today she took the pill around 12pm. The entire time she was talking she was clearing her throat. I asked if she's having difficulty breathing or catching her breath due to this, she says no. I advised not to take any more medication, advised ED or UC due to the issue with the throat explaining that she may need medication to help with the sensation she's having. She was hesitant about saying she will go. I advised I will send this to the provider for review and someone will call her back, but it's imperative she seeks treatment in the ED or UC for this.      Reason for Disposition  [1] Caller has URGENT medicine question about med that PCP or specialist prescribed AND [2] triager unable to answer question  Answer Assessment - Initial Assessment Questions 1. NAME of MEDICINE: "What medicine(s) are you calling about?"     Fluoxetine 2. QUESTION: "What is your question?" (e.g., double dose of medicine, side effect)     Side effect 3. PRESCRIBER: "Who prescribed the medicine?" Reason: if prescribed by specialist, call should be referred to that group.     Merita Norton 4. SYMPTOMS: "Do you have any symptoms?" If Yes, ask: "What symptoms are  you having?"  "How bad are the symptoms (e.g., mild, moderate, severe)     Throat has a tightness, lump feeling in the throat, producing a lot of saliva  Protocols used: Medication Question Call-A-AH

## 2023-06-07 NOTE — Telephone Encounter (Signed)
Routed PCP patient mychart message as well in case any further recommendations

## 2023-06-15 ENCOUNTER — Encounter: Payer: Self-pay | Admitting: Family Medicine

## 2023-06-15 ENCOUNTER — Ambulatory Visit: Payer: Medicaid Other | Admitting: Family Medicine

## 2023-06-15 VITALS — BP 121/96 | HR 101 | Ht 66.0 in | Wt 256.7 lb

## 2023-06-15 DIAGNOSIS — O165 Unspecified maternal hypertension, complicating the puerperium: Secondary | ICD-10-CM | POA: Insufficient documentation

## 2023-06-15 DIAGNOSIS — F39 Unspecified mood [affective] disorder: Secondary | ICD-10-CM | POA: Diagnosis not present

## 2023-06-15 DIAGNOSIS — F419 Anxiety disorder, unspecified: Secondary | ICD-10-CM | POA: Insufficient documentation

## 2023-06-15 DIAGNOSIS — F411 Generalized anxiety disorder: Secondary | ICD-10-CM | POA: Insufficient documentation

## 2023-06-15 MED ORDER — SERTRALINE HCL 25 MG PO TABS: 25.0000 mg | ORAL_TABLET | Freq: Every day | ORAL | 3 refills | Status: DC

## 2023-06-15 MED ORDER — NIFEDIPINE ER OSMOTIC RELEASE 30 MG PO TB24: 30.0000 mg | ORAL_TABLET | Freq: Every day | ORAL | 3 refills | Status: DC

## 2023-06-15 NOTE — Assessment & Plan Note (Signed)
Chronic, not treated Recommended start of nifedipine xl at 30 daily Goal of 139/89 or less

## 2023-06-15 NOTE — Progress Notes (Signed)
Established patient visit   Patient: Kimberly Farmer   DOB: 02-13-1996   26 y.o. Female  MRN: [id number] Visit Date: 06/15/2023  Today's healthcare provider: Jacky Kindle, FNP  Introduced to nurse practitioner role and practice setting.  All questions answered.  Discussed provider/patient relationship and expectations.  Subjective    HPI HPI     Medical Management of Chronic Issues    Additional comments: Has been going through anxiety, has been experiencing for a long time, has been heightened since having baby 4 months       Last edited by Rolly Salter, CMA on 06/15/2023  8:49 AM.      Medications: Outpatient Medications Prior to Visit  Medication Sig Note   [DISCONTINUED] dicloxacillin (DYNAPEN) 500 MG capsule Take 1 capsule (500 mg total) by mouth 4 (four) times daily. (Patient not taking: Reported on 06/15/2023)    [DISCONTINUED] FLUoxetine (PROZAC) 20 MG capsule Take 1 capsule (20 mg total) by mouth daily. (Patient not taking: Reported on 06/15/2023) 06/15/2023: reports pill dsyphagia   [DISCONTINUED] furosemide (LASIX) 20 MG tablet Take 1 tablet (20 mg total) by mouth daily. (Patient not taking: Reported on 06/15/2023)    [DISCONTINUED] hydrOXYzine (ATARAX) 25 MG tablet Take 1 tablet (25 mg total) by mouth every 6 (six) hours as needed for anxiety. (Patient not taking: Reported on 06/15/2023)    [DISCONTINUED] ibuprofen (ADVIL) 600 MG tablet Take 1 tablet (600 mg total) by mouth every 6 (six) hours. (Patient not taking: Reported on 06/15/2023)    [DISCONTINUED] acetaminophen (TYLENOL) tablet 650 mg     [DISCONTINUED] acetaminophen (TYLENOL) tablet 650 mg     [DISCONTINUED] diphenhydrAMINE (BENADRYL) capsule 25 mg     [DISCONTINUED] diphenhydrAMINE (BENADRYL) capsule 25 mg     [DISCONTINUED] iron sucrose (VENOFER) 200 mg in sodium chloride 0.9 % 100 mL IVPB     [DISCONTINUED] iron sucrose (VENOFER) 200 mg in sodium chloride 0.9 % 100 mL IVPB     No facility-administered  medications prior to visit.     Objective    BP (!) 121/96 (BP Location: Right Arm, Patient Position: Sitting, Cuff Size: Large)   Pulse (!) 101   Ht 5\' 6"  (1.676 m)   Wt 256 lb 11.2 oz (116.4 kg)   SpO2 98%   BMI 41.43 kg/m   Physical Exam Vitals and nursing note reviewed.  Constitutional:      General: She is not in acute distress.    Appearance: Normal appearance. She is obese. She is not ill-appearing, toxic-appearing or diaphoretic.  HENT:     Head: Normocephalic and atraumatic.  Cardiovascular:     Rate and Rhythm: Normal rate and regular rhythm.     Pulses: Normal pulses.     Heart sounds: Normal heart sounds. No murmur heard.    No friction rub. No gallop.  Pulmonary:     Effort: Pulmonary effort is normal. No respiratory distress.     Breath sounds: Normal breath sounds. No stridor. No wheezing, rhonchi or rales.  Chest:     Chest wall: No tenderness.  Musculoskeletal:        General: No swelling, tenderness, deformity or signs of injury. Normal range of motion.     Right lower leg: No edema.     Left lower leg: No edema.  Skin:    General: Skin is warm and dry.     Capillary Refill: Capillary refill takes less than 2 seconds.     Coloration: Skin is  not jaundiced or pale.     Findings: No bruising, erythema, lesion or rash.  Neurological:     General: No focal deficit present.     Mental Status: She is alert and oriented to person, place, and time. Mental status is at baseline.     Cranial Nerves: No cranial nerve deficit.     Sensory: No sensory deficit.     Motor: No weakness.     Coordination: Coordination normal.  Psychiatric:        Mood and Affect: Mood is anxious and depressed. Affect is labile and tearful.        Behavior: Behavior normal.        Thought Content: Thought content does not include homicidal or suicidal ideation. Thought content does not include homicidal or suicidal plan.        Judgment: Judgment normal.     No results found for  any visits on 06/15/23.  Assessment & Plan     Problem List Items Addressed This Visit       Cardiovascular and Mediastinum   Postpartum hypertension    Chronic, not treated Recommended start of nifedipine xl at 30 daily Goal of 139/89 or less      Relevant Medications   NIFEdipine (PROCARDIA-XL/NIFEDICAL-XL) 30 MG 24 hr tablet     Other   Mood disorder (HCC) - Primary    Chronic, worsening Recommend restart of SSRI tablet to assist Encouraged to take when SO or family is available given pt reported concerns of pill dysphagia previously Pt reports uncontrolled thoughts of irrational things including putting her child on the roof and driving away Recognizes hormonal component and chronic stressors from children who have special needs including autism and need for in home therapy following meningitis       Morbid obesity (HCC)    Chronic, stable Body mass index is 41.43 kg/m. Continue to recommend balanced, lower carb meals. Smaller meal size, adding snacks. Choosing water as drink of choice and increasing purposeful exercise.       Return in about 6 weeks (around 07/27/2023), or if symptoms worsen or fail to improve, for anxiety and depression.     Leilani Merl, FNP, have reviewed all documentation for this visit. The documentation on 06/15/23 for the exam, diagnosis, procedures, and orders are all accurate and complete.  Jacky Kindle, FNP  [facility] [phone] (phone) [phone] (fax)  [facility]

## 2023-06-15 NOTE — Assessment & Plan Note (Signed)
Chronic, worsening Recommend restart of SSRI tablet to assist Encouraged to take when SO or family is available given pt reported concerns of pill dysphagia previously Pt reports uncontrolled thoughts of irrational things including putting her child on the roof and driving away Recognizes hormonal component and chronic stressors from children who have special needs including autism and need for in home therapy following meningitis

## 2023-06-15 NOTE — Assessment & Plan Note (Signed)
Chronic, stable Body mass index is 41.43 kg/m. Continue to recommend balanced, lower carb meals. Smaller meal size, adding snacks. Choosing water as drink of choice and increasing purposeful exercise.

## 2023-07-25 ENCOUNTER — Ambulatory Visit (INDEPENDENT_AMBULATORY_CARE_PROVIDER_SITE_OTHER): Payer: Medicaid Other | Admitting: Family Medicine

## 2023-07-25 DIAGNOSIS — Z91199 Patient's noncompliance with other medical treatment and regimen due to unspecified reason: Secondary | ICD-10-CM

## 2023-07-25 NOTE — Progress Notes (Signed)
Patient was not seen for appt d/t no call, no show, or late arrival >10 mins past appt time.   Elise T Payne, FNP  [facility] [address] [place], [place] [address] [phone] (phone) [phone] (fax) [facility]  

## 2023-08-30 ENCOUNTER — Ambulatory Visit: Payer: Medicaid Other | Admitting: Family Medicine

## 2023-08-30 ENCOUNTER — Encounter: Payer: Self-pay | Admitting: Family Medicine

## 2023-08-30 VITALS — BP 125/85 | HR 95 | Resp 16 | Ht 66.0 in | Wt 256.0 lb

## 2023-08-30 DIAGNOSIS — N61 Mastitis without abscess: Secondary | ICD-10-CM | POA: Diagnosis not present

## 2023-08-30 MED ORDER — AMOXICILLIN 875 MG PO TABS
875.0000 mg | ORAL_TABLET | Freq: Two times a day (BID) | ORAL | 0 refills | Status: AC
Start: 2023-08-30 — End: 2023-09-06

## 2023-08-30 NOTE — Progress Notes (Signed)
Established patient visit   Patient: Kimberly Farmer   DOB: March 22, 1996   27 y.o. Female  MRN: [id number] Visit Date: 08/30/2023  Today's healthcare provider: Ronnald Ramp, MD   Chief Complaint  Patient presents with   Breast Problem   Subjective     HPI   Right side lump, improved since yesterday Last edited by Ashok Cordia, CMA on 08/30/2023  9:15 AM.       Discussed the use of AI scribe software for clinical note transcription with the patient, who gave verbal consent to proceed.  History of Present Illness   The patient is a 27 year old female who presents with concerns about palpable knots on her breasts. She first noticed a small raised area on her breast about three weeks ago, which she initially dismissed. However, the area became significantly raised and red in a heart-shaped pattern the day before her consultation. The patient describes the raised area as not being painful or warm to touch. She initially thought it might be due to clogged milk ducts, but after a nursing session and pumping six ounces of milk, the area did not cause any pain. The raised area reduced overnight, leaving some redness on her breast.  The patient is currently breastfeeding and has not experienced any issues related to breastfeeding. She has not noticed any other discharge apart from milk. She also mentioned a slight tenderness in one area of her breast. The patient has not noticed any similar issues on her other breast. She has been using warm compresses as a home remedy.         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   sertraline (ZOLOFT) 25 MG tablet Take 1 tablet (25 mg total) by mouth daily.   No facility-administered medications prior to visit.    Review of Systems      Objective    BP 125/85   Pulse 95   Resp 16   Ht 5\' 6"  (1.676 m)   Wt 256 lb (116.1 kg)   SpO2 95%   BMI 41.32 kg/m  BP Readings from Last 3 Encounters:  08/30/23 125/85  06/15/23 (!) 121/96  02/14/23 125/77   Wt Readings from Last 3 Encounters:  08/30/23 256 lb (116.1 kg)  06/15/23 256 lb 11.2 oz (116.4 kg)  01/25/23 257 lb (116.6 kg)          Physical Exam Chest:     Chest wall: Tenderness present. No mass.  Breasts:    Right: Skin change present. No swelling, bleeding or inverted nipple.     Left: Inverted nipple present. No swelling or bleeding.     Comments: Pt is breastfeeding  Mild erythema of right breast       No results found for any visits on 08/30/23.  Assessment & Plan  Problem List Items Addressed This Visit       Other   Mastitis - Primary    27 year old presenting with a palpable breast nodule and erythema, fluctuating in size and redness over a few weeks. No pain, warmth, or systemic symptoms such as fever. Differential includes clogged milk ducts versus early mastitis, with mastitis being less likely due to absence of fever and significant pain. Rapid resolution of symptoms is reassuring. Discussed conservative management with warm compresses and monitoring for signs of progression. - acute  - start amox 875mg  every 12 hours for 7 day course  - Apply warm compresses to the affected area - Monitor for worsening erythema, increased tenderness, or development of fever -  Contact clinic if symptoms worsen or new symptoms such as warmth or increased erythema develop - Send a picture if there are any changes.       Relevant Medications   amoxicillin (AMOXIL) 875 MG tablet          Return in about 10 days (around 09/09/2023), or if symptoms worsen or fail to improve.      Ronnald Ramp, MD  [facility] [phone] (phone) [phone] (fax)  [facility]

## 2023-08-30 NOTE — Assessment & Plan Note (Signed)
27 year old presenting with a palpable breast nodule and erythema, fluctuating in size and redness over a few weeks. No pain, warmth, or systemic symptoms such as fever. Differential includes clogged milk ducts versus early mastitis, with mastitis being less likely due to absence of fever and significant pain. Rapid resolution of symptoms is reassuring. Discussed conservative management with warm compresses and monitoring for signs of progression. - acute  - start amox 875mg  every 12 hours for 7 day course  - Apply warm compresses to the affected area - Monitor for worsening erythema, increased tenderness, or development of fever - Contact clinic if symptoms worsen or new symptoms such as warmth or increased erythema develop - Send a picture if there are any changes.

## 2023-08-30 NOTE — Patient Instructions (Signed)
VISIT SUMMARY:  Today, you were seen for concerns about a raised, red area on your breast. The area has fluctuated in size and redness, and you have been using warm compresses as a home remedy. You are currently breastfeeding and have not experienced any significant pain or other symptoms.  YOUR PLAN:  -BREAST NODULE WITH ERYTHEMA: t. This can be due to clogged milk ducts or early mastitis, although mastitis is less likely since you do not have a fever or significant pain. The plan is to continue using warm compresses and monitor for any worsening symptoms such as increased redness, tenderness, or fever. If symptoms worsen or new symptoms develop, contact the clinic and send a picture of the changes.  INSTRUCTIONS:  Please apply warm compresses to the affected area and monitor for any worsening symptoms. If you notice increased redness, tenderness, or develop a fever, contact the clinic immediately and send a picture of the changes.  I have prescribed Amoxicillin 875mg  every 12 hours for the next week. Please take as directed and notify our office of any symptoms that we discussed.

## 2023-10-23 ENCOUNTER — Ambulatory Visit: Payer: Self-pay

## 2023-10-23 NOTE — Telephone Encounter (Signed)
  Chief Complaint: knee swelling  Symptoms: L knee swelling and pain, locking up at times, toenails blue at times after sitting for a while, has hx of low iron   Frequency: unsure how long  Pertinent Negatives: NA Disposition: [] ED /[] Urgent Care (no appt availability in office) / [x] Appointment(In office/virtual)/ []  [facility]/ [] Home Care/ [] Refused Recommended Disposition /[] [facility]/ []  Follow-up with PCP Additional Notes: pt needed to r/s her appt on 10/25/23 d/t her son has appt. R/s to 10/26/23 0940 with Dr. Gasper. Pt states she was taking iron  infusions before birth of last child so unsure if toenails are d/t low iron  again. Pt wanting referral to Ortho as well for knee   Reason for Disposition  Knee swelling is a chronic symptom (recurrent or ongoing AND present > 4 weeks)  Answer Assessment - Initial Assessment Questions 1. LOCATION: Where is the swelling located?  (e.g., left, right, both knees)     L knee  2. ONSET: When did the swelling start? Does it come and go, or is it there all the time?     Unsure  3. SWELLING: How bad is the swelling? Or, How large is it? (e.g., mild, moderate, severe; size of localized swelling)    - NONE: No joint swelling.   - LOCALIZED: Localized; small area of puffy or swollen skin (e.g., insect bite, skin irritation).   - MILD: Joint looks or feels mildly swollen or puffy.   - MODERATE: Swollen; interferes with normal activities (e.g., work or school); can't move joint normally (bend and straighten completely); may be limping.   - SEVERE: Very swollen; can't move swollen joint at all; limping a lot or unable to walk.     Mild to moderate  4. PAIN: Is there any pain? If Yes, ask: How bad is it? (Scale 1-10; or mild, moderate, severe)   - NONE (0): no pain.   - MILD (1-3): doesn't interfere with normal activities.    - MODERATE (4-7): interferes with normal activities (e.g., work or school) or awakens from  sleep, limping.    - SEVERE (8-10): excruciating pain, unable to do any normal activities, unable to walk.      Locks up at times  6. AGGRAVATING FACTORS: What makes the knee swelling worse? (e.g., walking, climbing stairs, running)     Walking  8. OTHER SYMPTOMS: Do you have any other symptoms? (e.g., chest pain, difficulty breathing, fever, calf pain)     Toenails turning blue d/t low iron  in past (happens when sitting for a while)  Protocols used: Knee Swelling-A-AH

## 2023-10-25 ENCOUNTER — Ambulatory Visit: Payer: Medicaid Other | Admitting: Family Medicine

## 2023-10-26 ENCOUNTER — Ambulatory Visit: Payer: Medicaid Other | Admitting: Family Medicine

## 2023-10-31 ENCOUNTER — Ambulatory Visit: Payer: Medicaid Other | Admitting: Family Medicine

## 2023-11-08 NOTE — Progress Notes (Unsigned)
Established patient visit   Patient: Kimberly Farmer   DOB: 1996/08/14   28 y.o. Female  MRN: [id number] Visit Date: 11/09/2023  Today's healthcare provider: Ronnald Ramp, MD   Chief Complaint  Patient presents with   Knee Pain    Knee is starting to lock up.    Subjective     HPI     Knee Pain    Additional comments: Knee is starting to lock up.       Last edited by Bevelyn Ngo, CMA on 11/09/2023  2:16 PM.       Discussed the use of AI scribe software for clinical note transcription with the patient, who gave verbal consent to proceed.  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 (Patient not taking: Reported on 11/09/2023)   sertraline (ZOLOFT) 25 MG tablet Take 1 tablet (25 mg total) by mouth daily. (Patient not taking: Reported on 11/09/2023)   No facility-administered medications prior to visit.    Review of Systems  Last CBC Lab Results  Component Value Date   WBC 9.8 02/14/2023   HGB 11.0 (L) 02/14/2023   HCT 36.4 02/14/2023   MCV 77.3 (L) 02/14/2023   MCH 23.4 (L) 02/14/2023   RDW 19.4 (H) 02/14/2023   PLT 436 (H) 02/14/2023    Last metabolic panel Lab Results  Component Value Date   GLUCOSE 89 01/23/2023   NA 139 01/23/2023   K 3.8 01/23/2023   CL 105 01/23/2023   CO2 18 (L) 01/23/2023   BUN 5 (L) 01/23/2023   CREATININE 0.48 02/08/2023   GFRNONAA >60 02/08/2023   CALCIUM 8.8 01/23/2023   PROT 5.9 (L) 01/23/2023   ALBUMIN 3.7 (L) 01/23/2023   LABGLOB 2.2 01/23/2023   AGRATIO 1.7 01/23/2023   BILITOT <0.2 01/23/2023   ALKPHOS 134 (H) 01/23/2023   AST 13 01/23/2023   ALT 15 01/23/2023   ANIONGAP 7 09/26/2022   Last hemoglobin A1c Lab Results  Component Value Date   HGBA1C 5.5 08/25/2022   Last thyroid functions Lab Results  Component Value Date   TSH 0.936 08/25/2022     {See past labs  Heme  Chem  Endocrine  Serology  Results Review (optional):1}   Objective    BP 90/76   Pulse (!) 111   Ht 5\' 6"  (1.676 m)   Wt 254 lb 6.4 oz (115.4 kg)   SpO2 97%   BMI 41.06 kg/m  BP Readings from Last 3 Encounters:  11/09/23 90/76  08/30/23 125/85  06/15/23 (!) 121/96   Wt Readings from Last 3 Encounters:  11/09/23 254  lb 6.4 oz (115.4 kg)  08/30/23 256 lb (116.1 kg)  06/15/23 256 lb 11.2 oz (116.4 kg)    {See vitals history (optional):1}    Physical Exam  Knee: - Inspection: no gross deformity. No swelling/effusion, erythema or bruising. Skin intact, has medial left knee surgical scar  - Palpation: TTP along medial left knee joint line  - ROM: full active ROM with flexion and extension in knee  - Strength: 5/5 strength - Neuro/vasc: NV intact - Special Tests: - LIGAMENTS:  negative Lachman's, no MCL or LCL laxity     No results found for any visits on 11/09/23.  Assessment & Plan     Problem List Items Addressed This Visit       Other   Morbid obesity (HCC)   Relevant Orders   Amb ref to Medical Nutrition Therapy-MNT   Anxiety   Relevant Medications   FLUoxetine (PROZAC) 10 MG tablet   Other Visit Diagnoses       Blue toes    -  Primary   Relevant Orders   US  ARTERIAL ABI (SCREENING LOWER EXTREMITY)     Knee locking, left       Relevant Orders   AMB referral to orthopedics                     Return in about 2 weeks (around 11/23/2023) for Anxiety.        Ronnald Ramp, MD  [facility] [phone] (phone) [phone] (fax)  [facility]

## 2023-11-09 ENCOUNTER — Ambulatory Visit: Payer: Medicaid Other | Admitting: Family Medicine

## 2023-11-09 ENCOUNTER — Encounter: Payer: Self-pay | Admitting: Family Medicine

## 2023-11-09 VITALS — BP 90/76 | HR 111 | Ht 66.0 in | Wt 254.4 lb

## 2023-11-09 DIAGNOSIS — R230 Cyanosis: Secondary | ICD-10-CM

## 2023-11-09 DIAGNOSIS — M2392 Unspecified internal derangement of left knee: Secondary | ICD-10-CM

## 2023-11-09 DIAGNOSIS — F419 Anxiety disorder, unspecified: Secondary | ICD-10-CM | POA: Diagnosis not present

## 2023-11-09 MED ORDER — FLUOXETINE HCL 10 MG PO TABS
20.0000 mg | ORAL_TABLET | Freq: Every day | ORAL | 3 refills | Status: DC
Start: 2023-11-09 — End: 2024-03-03

## 2023-11-10 DIAGNOSIS — R230 Cyanosis: Secondary | ICD-10-CM | POA: Insufficient documentation

## 2023-11-10 NOTE — Assessment & Plan Note (Signed)
Chronic  BMI 41,  Weight: 254 lb 6.4 oz (115.4 kg)  Pt is interested in speaking with nutritionist to help with weight loss Reports losing 70lbs after birth of children and then regaining weight during pregnancy with her last baby  Referral for medical nutrition submitted per patient request  Pt does not express interest in GLP1 agents at this time

## 2023-11-10 NOTE — Assessment & Plan Note (Signed)
Chronic knee pain with a history of multiple surgeries on both knees, including five on the left and two on the right. Reports swelling, locking, and clicking in the left knee, exacerbated by sedentary behavior. 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 History of childhood deteriorating arthritis and degenerative disc disease. Discussed referral to orthopedics for updated x-rays and further evaluation, with potential non-surgical options due to young age. - Refer to orthopedics for further evaluation and management - Order updated x-rays of the knees - Recommend anti-inflammatory medications as needed - Encourage physical therapy and exercises that do not exacerbate pain

## 2023-11-10 NOTE — Assessment & Plan Note (Signed)
Bluish discoloration of the toenails when sitting for prolonged periods, suggesting possible circulatory issues. Denies pain, burning, or tingling sensations in the feet and has no history of smoking. Discussed the possibility of cold-induced discoloration versus circulatory issues. Explained the ABI test to assess circulation. - Order ABI (Ankle-Brachial Index) to assess circulation - Schedule ABI at the outpatient imaging center

## 2023-11-10 NOTE — Assessment & Plan Note (Signed)
Anxiety previously managed with Zoloft, discontinued due to adverse effects. Reports symptoms including auditory overstimulation and situational anxiety related to maternal concerns. Discussed switching to Prozac, starting with a low dose and increasing as tolerated. Explained potential side effects and the importance of monitoring response. - Prescribe Prozac 10 mg daily for one week, then increase to 20 mg daily - Schedule a virtual follow-up in two weeks to assess response to medication

## 2023-11-12 ENCOUNTER — Encounter: Payer: Self-pay | Admitting: Family Medicine

## 2023-11-23 ENCOUNTER — Telehealth (INDEPENDENT_AMBULATORY_CARE_PROVIDER_SITE_OTHER): Payer: Medicaid Other | Admitting: Family Medicine

## 2023-11-23 NOTE — Progress Notes (Signed)
Pt did not show for scheduled appt

## 2023-12-10 ENCOUNTER — Ambulatory Visit: Payer: Medicaid Other | Admitting: Dietician

## 2024-01-07 ENCOUNTER — Ambulatory Visit: Payer: Medicaid Other | Admitting: Dietician

## 2024-01-10 ENCOUNTER — Encounter: Payer: Self-pay | Admitting: Family Medicine

## 2024-03-03 ENCOUNTER — Other Ambulatory Visit (HOSPITAL_COMMUNITY): Payer: Self-pay

## 2024-03-03 ENCOUNTER — Other Ambulatory Visit: Payer: Self-pay

## 2024-03-03 ENCOUNTER — Telehealth: Payer: Self-pay | Admitting: Family Medicine

## 2024-03-03 DIAGNOSIS — F419 Anxiety disorder, unspecified: Secondary | ICD-10-CM

## 2024-03-03 MED ORDER — FLUOXETINE HCL 10 MG PO CAPS
20.0000 mg | ORAL_CAPSULE | Freq: Every day | ORAL | 3 refills | Status: AC
Start: 2024-03-03 — End: ?
  Filled 2024-03-03: qty 60, 30d supply, fill #0

## 2024-03-03 NOTE — Telephone Encounter (Signed)
 [facility] is requesting refill FLUoxetine  (PROZAC ) 10 MG tablet   Please advise

## 2024-03-03 NOTE — Telephone Encounter (Signed)
Converted to refill request 

## 2024-03-05 ENCOUNTER — Telehealth: Payer: Self-pay | Admitting: Family Medicine

## 2024-03-05 NOTE — Telephone Encounter (Signed)
 [facility] is requesting refill  FLUoxetine  (PROZAC ) 10 MG capsule   Please advise

## 2024-05-22 ENCOUNTER — Ambulatory Visit: Payer: Self-pay

## 2024-05-22 NOTE — Telephone Encounter (Signed)
 Patient unable to come in for in office visit. Scheduled virtual.  FYI Only or Action Required?: FYI only for provider.  Patient was last seen in primary care on 11/09/2023 by Sharma Coyer, MD.  Called Nurse Triage reporting Urinary Tract Infection.  Symptoms began yesterday.  Interventions attempted: Rest, hydration, or home remedies.  Symptoms are: gradually worsening.  Triage Disposition: See Physician Within 24 Hours  Patient/caregiver understands and will follow disposition?:  Reason for Disposition  Urinating more frequently than usual (i.e., frequency) OR new-onset of the feeling of an urgent need to urinate (i.e., urgency)  Answer Assessment - Initial Assessment Questions 1. SYMPTOM: What's the main symptom you're concerned about? (e.g., frequency, incontinence)     Increased frequency, burning with urination, clear discharge   2. ONSET:      2 days  3. PAIN: Is there any pain? If Yes, ask: How bad is it? (Scale: 1-10; mild, moderate, severe)     Burning with urination  4. CAUSE: What do you think is causing the symptoms?     UTI  5. OTHER SYMPTOMS: Do you have any other symptoms? (e.g., blood in urine, fever, flank pain, pain with urination)     Denies  6. PREGNANCY: Is there any chance you are pregnant? When was your last menstrual period?     Denies  Protocols used: Urinary Symptoms-A-AH Copied from CRM [id number]. Topic: Clinical - Red Word Triage >> May 22, 2024  9:12 AM Vena H wrote: Red Word that prompted transfer to Nurse Triage: Pt is having frequent urination and burning with urinating

## 2024-05-23 ENCOUNTER — Telehealth (INDEPENDENT_AMBULATORY_CARE_PROVIDER_SITE_OTHER): Admitting: Family Medicine

## 2024-05-23 DIAGNOSIS — R5382 Chronic fatigue, unspecified: Secondary | ICD-10-CM

## 2024-05-23 DIAGNOSIS — R300 Dysuria: Secondary | ICD-10-CM

## 2024-05-23 DIAGNOSIS — N390 Urinary tract infection, site not specified: Secondary | ICD-10-CM | POA: Diagnosis not present

## 2024-05-23 MED ORDER — CEPHALEXIN 500 MG PO CAPS
500.0000 mg | ORAL_CAPSULE | Freq: Two times a day (BID) | ORAL | 0 refills | Status: AC
Start: 2024-05-23 — End: 2024-05-26

## 2024-05-23 NOTE — Telephone Encounter (Signed)
 Pt has been contacted, she has agreed to come in to drop off a urine sample before her video visit

## 2024-05-23 NOTE — Progress Notes (Signed)
 MyChart Video Visit    Virtual Visit via Video Note   This format is felt to be most appropriate for this patient at this time. Physical exam was limited by quality of the video and audio technology used for the visit.   Patient location: Home, in [place], [place] Provider location: [facility]  I discussed the limitations of evaluation and management by telemedicine and the availability of in person appointments. The patient expressed understanding and agreed to proceed.  Patient: Kimberly Farmer   DOB: 1996-07-01   28 y.o. Female  MRN: [id number] Visit Date: 05/23/2024  Today's healthcare provider: LAURAINE LOISE BUOY, DO   No chief complaint on file.  Subjective    HPI  Kimberly Farmer is a 28 year old female with a history of frequent urinary tract infections who presents via virtual visit with symptoms suggestive of a urinary tract infection.  She experiences dysuria, which began recently and was more pronounced yesterday but has slightly improved today. She has not been able to provide a urine sample due to logistical issues with the clinic's communication and her living situation in [place], [place] , which is a considerable distance from the clinic.  She notes increased urinary frequency, which she attributes to increased water  intake as she attempts to reduce her caffeine consumption. No fever, chills, or flank pain. She has a history of frequent urinary tract infections, particularly noted during her previous pregnancies, although she has not been formally evaluated by a urologist.  She also describes feeling persistently tired despite reducing her caffeine intake and increasing water  consumption. She reports doing well emotionally, better than in previous times, and does not express any current emotional distress.  She has a three-year-old with autism, which impacts her ability to attend medical appointments. 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 She typically sleeps from 10 PM to around 5:40 AM.     Medications: Outpatient Medications Prior to Visit  Medication Sig   FLUoxetine  (PROZAC ) 10 MG capsule Take 2 capsules (20 mg total) by mouth daily.   NIFEdipine  (PROCARDIA -XL/NIFEDICAL-XL) 30 MG 24 hr tablet Take 1 tablet (30 mg total) by mouth daily. (Patient not taking: Reported on 11/09/2023)   sertraline  (ZOLOFT ) 25 MG tablet Take 1 tablet (25 mg total) by mouth daily. (Patient not taking: Reported on 11/09/2023)   No facility-administered medications prior to visit.    Review of Systems  Constitutional:  Positive for fatigue (chronic). Negative for chills and fever.  Genitourinary:  Positive for dysuria and frequency. Negative for flank pain and hematuria.       No flank pain reported.        Objective    There were no vitals taken for this visit.     Physical Exam Constitutional:      General: She is not in acute distress.    Appearance: Normal appearance.  HENT:     Head: Normocephalic.  Pulmonary:     Effort: Pulmonary effort is normal. No respiratory distress.  Neurological:     Mental Status: She is alert and oriented to person, place, and time. Mental status is at baseline.       Assessment & Plan    Recurrent urinary tract infection -     Ambulatory referral to Urology -     Cephalexin ; Take 1 capsule (500 mg total) by mouth 2 (two)  times daily for 3 days.  Dispense: 6 capsule; Refill: 0  Dysuria -     UA/M w/rflx Culture, Routine  Chronic fatigue    Recurrent urinary tract infections Recurrent UTIs with dysuria and frequency, improving but persistent. Possible anatomical issues. No prior urology evaluation. - Order urinalysis to confirm UTI. - Prescribe short course of antibiotics post urine sample. - Refer to urology for evaluation, accommodating children during visits.  Chronic  fatigue Fatigue possibly due to caffeine withdrawal and lifestyle factors. Emotional state well-managed. - Discuss sleep patterns and emotional well-being. - Encourage continued reduction of caffeine intake and increase in water  consumption. - Recommend further follow-up with PCP if not improving    Return if symptoms worsen or fail to improve.     I discussed the assessment and treatment plan with the patient. The patient was provided an opportunity to ask questions and all were answered. The patient agreed with the plan and demonstrated an understanding of the instructions.   The patient was advised to call back or seek an in-person evaluation if the symptoms worsen or if the condition fails to improve as anticipated.  I provided 15 minutes of virtual-face-to-face time during this encounter.   LAURAINE LOISE BUOY, DO [facility] [phone] (phone) [phone] (fax)  [facility]

## 2024-05-27 ENCOUNTER — Emergency Department (HOSPITAL_COMMUNITY)

## 2024-05-27 ENCOUNTER — Emergency Department (HOSPITAL_COMMUNITY)
Admission: EM | Admit: 2024-05-27 | Discharge: 2024-05-27 | Disposition: A | Discharge status: Home / Self Care | Attending: Emergency Medicine | Admitting: Emergency Medicine

## 2024-05-27 ENCOUNTER — Encounter (HOSPITAL_COMMUNITY): Payer: Self-pay

## 2024-05-27 ENCOUNTER — Other Ambulatory Visit: Payer: Self-pay

## 2024-05-27 DIAGNOSIS — Y9301 Activity, walking, marching and hiking: Secondary | ICD-10-CM | POA: Insufficient documentation

## 2024-05-27 DIAGNOSIS — M25562 Pain in left knee: Secondary | ICD-10-CM | POA: Diagnosis present

## 2024-05-27 MED ORDER — METHYLPREDNISOLONE 4 MG PO TBPK
ORAL_TABLET | ORAL | 0 refills | Status: AC
Start: 2024-05-27 — End: ?

## 2024-05-27 MED ORDER — DEXAMETHASONE 4 MG PO TABS
10.0000 mg | ORAL_TABLET | Freq: Once | ORAL | Status: AC
  Administered 2024-05-27 (×2): 10 mg via ORAL
  Filled 2024-05-27: qty 3

## 2024-05-27 NOTE — Discharge Instructions (Signed)
 Take next dose of steroid tomorrow.  Recommend 1000 mg of Tylenol  every 6 hours as needed for pain as well.  Recommend ice.  Weight-bear as tolerated and follow-up with orthopedics.

## 2024-05-27 NOTE — ED Triage Notes (Signed)
 Pt came in via POV d/t Lt knee pain, Hx of surgeries on it & since yesterday the severalty of her pain worsened. That knee has not been able to straighten, is very swollen & feels like it is veering towards her other knee instead of going straight. A/Ox4, rates her pain a dull 8/10 when standing & 2/10 when sitting.

## 2024-05-27 NOTE — ED Provider Notes (Signed)
 [place] EMERGENCY DEPARTMENT AT [facility] Provider Note   CSN: [id number] Arrival date & time: 05/27/24  1030     Patient presents with: Knee Pain   Kimberly Farmer is a 28 y.o. female.   Patient here with acute on chronic left knee pain.  Multiple knee surgeries in the past.  She is having some pain especially with walking.  Denies any specific trauma.  Denies any fevers or chills.  Movement makes it worse.  Rest makes it better.  Does not locally have an orthopedic that she follows with anymore for this.  She denies any pain otherwise.  The history is provided by the patient.       Prior to Admission medications   Medication Sig Start Date End Date Taking? Authorizing Provider  FLUoxetine  (PROZAC ) 10 MG capsule Take 2 capsules (20 mg total) by mouth daily. 03/03/24   Simmons-Robinson, Rockie, MD  NIFEdipine  (PROCARDIA -XL/NIFEDICAL-XL) 30 MG 24 hr tablet Take 1 tablet (30 mg total) by mouth daily. Patient not taking: Reported on 11/09/2023 06/15/23   Emilio Marseille T, FNP  sertraline  (ZOLOFT ) 25 MG tablet Take 1 tablet (25 mg total) by mouth daily. Patient not taking: Reported on 11/09/2023 06/15/23   Emilio Marseille T, FNP    Allergies: Hydrocodone, Oxycodone , and Tape    Review of Systems  Updated Vital Signs BP 129/77   Pulse (!) 112   Temp 98.2 F (36.8 C)   Resp 18   Ht 5' 6 (1.676 m)   Wt 108.4 kg   LMP  (LMP Unknown)   SpO2 100%   BMI 38.58 kg/m   Physical Exam Vitals and nursing note reviewed.  Constitutional:      General: She is not in acute distress.    Appearance: She is well-developed.  HENT:     Head: Normocephalic and atraumatic.     Mouth/Throat:     Mouth: Mucous membranes are moist.  Eyes:     Extraocular Movements: Extraocular movements intact.     Conjunctiva/sclera: Conjunctivae normal.     Pupils: Pupils are equal, round, and reactive to light.  Cardiovascular:     Rate and Rhythm: Normal rate and regular rhythm.     Pulses:  Normal pulses.     Heart sounds: No murmur heard. Pulmonary:     Effort: Pulmonary effort is normal. No respiratory distress.     Breath sounds: Normal breath sounds.  Abdominal:     Palpations: Abdomen is soft.     Tenderness: There is no abdominal tenderness.  Musculoskeletal:        General: Tenderness present. No swelling.     Cervical back: Neck supple.     Comments: Some tenderness to palpation of the left knee but there is no obvious swelling redness erythema or warmth she got good range of motion but with discomfort.  Skin:    General: Skin is warm and dry.     Capillary Refill: Capillary refill takes less than 2 seconds.  Neurological:     General: No focal deficit present.     Mental Status: She is alert and oriented to person, place, and time.     Cranial Nerves: No cranial nerve deficit.     Sensory: No sensory deficit.     Motor: No weakness.     Coordination: Coordination normal.  Psychiatric:        Mood and Affect: Mood normal.     (all labs ordered are listed, but only abnormal  results are displayed) Labs Reviewed - No data to display  EKG: None  Radiology: DG Knee Complete 4 Views Left Result Date: 05/27/2024 CLINICAL DATA:  Knee pain for several years, recent worsening. EXAM: LEFT KNEE - COMPLETE 4+ VIEW COMPARISON:  None Available. FINDINGS: No fracture or dislocation. Mild tricompartmental peripheral spurring. Undulation of the medial femoral condyle may represent underlying chondral irregularity. No joint effusion. No erosive or bony destructive change. Unremarkable soft tissues. IMPRESSION: 1. No acute findings. 2. Mild tricompartmental osteoarthritis, age advanced. 3. Undulation of the medial femoral condyle may represent underlying chondral irregularity. Electronically Signed   By: Andrea Gasman M.D.   On: 05/27/2024 12:01     Procedures   Medications Ordered in the ED  dexamethasone  (DECADRON ) tablet 10 mg (has no administration in time range)                                     Medical Decision Making Amount and/or Complexity of Data Reviewed Radiology: ordered.  Risk Prescription drug management.   Kimberly Farmer is here with acute on chronic left knee pain.  Normal vitals.  No fever.  She has history of multiple surgeries to this left knee.  There is no major swelling warmth or erythema.  Range of motion is good but with discomfort.  Differential diagnosis likely inflammatory type process.  I have no concern for septic joint or infectious process.  She denies any trauma.  I do not appear to notice any obvious laxity of the knee joint.  X-ray showed per radiology report significant arthritis but no effusion.  Overall I suspect acute on chronic pain likely from inflammatory process.  Will prescribe Medrol  Dosepak.  Given a dose of Decadron  here in the ED.  Recommend ice rest Tylenol  compression crutches and follow-up with orthopedics.  Overall I do not think there is any acute process otherwise.  Discharge.  She is neurovascular neuromuscular intact.  This chart was dictated using voice recognition software.  Despite best efforts to proofread,  errors can occur which can change the documentation meaning.      Final diagnoses:  None    ED Discharge Orders     None          Ruthe Cornet, DO 05/27/24 1548

## 2024-06-02 ENCOUNTER — Encounter: Payer: Self-pay | Admitting: Family Medicine

## 2024-06-20 ENCOUNTER — Ambulatory Visit: Admitting: Family Medicine

## 2024-07-25 ENCOUNTER — Ambulatory Visit: Payer: Self-pay

## 2024-07-25 NOTE — Telephone Encounter (Signed)
 FYI Only or Action Required?: FYI only for provider.  Patient was last seen in primary care on 05/23/2024 by Kimberly Lauraine SAILOR, DO.  Called Nurse Triage reporting Anxiety.  Symptoms began ongoing problem for the patient.  Interventions attempted: Nothing.  Symptoms are: gradually worsening.  Triage Disposition: See PCP Within 2 Weeks  Patient/caregiver understands and will follow disposition?: Yes     Copied from CRM [id number]. Topic: Clinical - Prescription Issue >> Jul 25, 2024  1:03 PM Suzette B wrote: Reason for CRM: FLUoxetine  (PROZAC ) 10 MG capsule,patient stated that she doesn't have a pcp as of now she was a patient of [facility]. She stated she had completely stopped taking the medication because she was feeling fine, and realized the medication was actually helping her. Please call patient to advise Kimberly Farmer [phone] [facility])  [facility]  [address] [place], [place] [address] [phone]       Reason for Disposition  [1] Symptoms of anxiety or panic attack AND [2] is a chronic symptom (recurrent or ongoing AND present > 4 weeks)  Answer Assessment - Initial Assessment Questions 1. CONCERN: Did anything happen that prompted you to call today?      Increased stress 2. ANXIETY SYMPTOMS: Can you describe how you (your loved one; patient) have been feeling? (e.g., tense, restless, panicky, anxious, keyed up, overwhelmed, sense of impending doom).      Increased stress recently  3. ONSET: How long have you been feeling this way? (e.g., hours, days, weeks)     Increasing recently  4. SEVERITY: How would you rate the level of anxiety? (e.g., 0 - 10; or mild, moderate, severe).     Mild  5. FUNCTIONAL IMPAIRMENT: How have these feelings affected your ability to do daily activities? Have you had more difficulty than usual doing your normal daily activities? (e.g., getting better, same, worse; self-care, school, work, interactions)      Mild  6. HISTORY: Have you felt this way before? Have you ever been diagnosed with an anxiety problem in the past? (e.g., generalized anxiety disorder, panic attacks, PTSD). If Yes, ask: How was this problem treated? (e.g., medicines, counseling, etc.)     Yes, history of anxiety and panic attacks  7. RISK OF HARM - SUICIDAL IDEATION: Do you ever have thoughts of hurting or killing yourself? If Yes, ask:  Do you have these feelings now? Do you have a plan on how you would do this?     No 8. TREATMENT:  What has been done so far to treat this anxiety? (e.g., medicines, relaxation strategies). What has helped?     Not currently on medication  9. THERAPIST: Do you have a counselor or therapist? If Yes, ask: What is their name?     No 10. POTENTIAL TRIGGERS: Do you drink caffeinated beverages (e.g., coffee, colas, teas), and how much daily? Do you drink alcohol or use any drugs? Have you started any new medicines recently?       Changes with the kids school  11. PATIENT SUPPORT: Who is with you now? Who do you live with? Do you have family or friends who you can talk to?        Family  12. OTHER SYMPTOMS: Do you have any other symptoms? (e.g., feeling depressed, trouble concentrating, trouble sleeping, trouble breathing, palpitations or fast heartbeat, chest pain, sweating, nausea, or diarrhea)       No  Protocols used: Anxiety and Panic Attack-A-AH

## 2024-08-01 ENCOUNTER — Ambulatory Visit: Admitting: Family Medicine

## 2024-08-01 ENCOUNTER — Encounter: Payer: Self-pay | Admitting: Family Medicine

## 2024-08-01 VITALS — BP 115/69 | HR 89 | Temp 97.9°F | Ht 66.0 in | Wt 238.8 lb

## 2024-08-01 DIAGNOSIS — Z136 Encounter for screening for cardiovascular disorders: Secondary | ICD-10-CM

## 2024-08-01 DIAGNOSIS — Z13228 Encounter for screening for other metabolic disorders: Secondary | ICD-10-CM

## 2024-08-01 DIAGNOSIS — E669 Obesity, unspecified: Secondary | ICD-10-CM | POA: Diagnosis not present

## 2024-08-01 DIAGNOSIS — Z789 Other specified health status: Secondary | ICD-10-CM | POA: Diagnosis not present

## 2024-08-01 DIAGNOSIS — Z130 Encounter for screening for diseases of the blood and blood-forming organs and certain disorders involving the immune mechanism: Secondary | ICD-10-CM

## 2024-08-01 DIAGNOSIS — F411 Generalized anxiety disorder: Secondary | ICD-10-CM

## 2024-08-01 DIAGNOSIS — R002 Palpitations: Secondary | ICD-10-CM

## 2024-08-01 DIAGNOSIS — Z1329 Encounter for screening for other suspected endocrine disorder: Secondary | ICD-10-CM

## 2024-08-01 MED ORDER — PAROXETINE HCL 20 MG PO TABS: 20.0000 mg | ORAL_TABLET | Freq: Every day | ORAL | 3 refills | Status: DC

## 2024-08-01 NOTE — Progress Notes (Unsigned)
 Established patient visit   Patient: Kimberly Farmer   DOB: 12-10-1995   28 y.o. Female  MRN: [id number] Visit Date: 08/01/2024  Today's healthcare provider: LAURAINE LOISE BUOY, DO   Chief Complaint  Patient presents with   Transitions Of Care    Patient reports she is here for a transfer of care and concerned because she is having increased anxiety.   Subjective    HPI Kimberly Farmer is a 28 year old female with anxiety who presents with anxiety and medication management. She is accompanied by her child, Ginnie.  She has been experiencing ongoing anxiety. 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 Previously, she felt stable but did not refill her medication, fluoxetine , due to feeling fine at the time. Recently, her anxiety has increased, prompting her to seek a re-evaluation for medication management.  She has a history of using fluoxetine , sertraline , and bupropion  for anxiety. She recalls taking fluoxetine  for one month but stopped due to difficulty with refills. She has also tried gabapentin , possibly for anxiety or pain, but is unsure of the exact reason.  Her current life is hectic, managing her children's needs, including her autistic child who experiences anxiety and sensory issues. She mentions her three-year-old son, who has limited verbal communication and often screams when overwhelmed, making outings challenging.  She experiences heart palpitations, which she associates with anxiety, particularly during overwhelming situations involving loud noises. She is concerned about her weight and its impact on her knees, as she has a history of knee surgeries and swelling.  She is breastfeeding her youngest child and is considering stopping but finds it challenging due to the child's nighttime waking and crying episodes. She is also trying to manage her caffeine intake, which she feels is excessive due to her constant fatigue and also contributes to  her weight gain due to added milk and sugar.  No thoughts of self-harm or harm to others. She has a family history of heart issues, as her mother has an irregular heart rhythm.      Medications: Outpatient Medications Prior to Visit  Medication Sig   [DISCONTINUED] FLUoxetine  (PROZAC ) 10 MG capsule Take 2 capsules (20 mg total) by mouth daily.   [DISCONTINUED] methylPREDNISolone  (MEDROL  DOSEPAK) 4 MG TBPK tablet Follow package insert   [DISCONTINUED] NIFEdipine  (PROCARDIA -XL/NIFEDICAL-XL) 30 MG 24 hr tablet Take 1 tablet (30 mg total) by mouth daily. (Patient not taking: Reported on 11/09/2023)   [DISCONTINUED] sertraline  (ZOLOFT ) 25 MG tablet Take 1 tablet (25 mg total) by mouth daily. (Patient not taking: Reported on 11/09/2023)   No facility-administered medications prior to visit.        Objective    BP 115/69 (BP Location: Right Arm, Patient Position: Sitting, Cuff Size: Large)   Pulse 89   Temp 97.9 F (36.6 C) (Oral)   Ht 5' 6 (1.676 m)   Wt 238 lb 12.8 oz (108.3 kg)   SpO2 98%   Breastfeeding Yes   BMI 38.54 kg/m     Physical Exam Vitals and nursing note reviewed.  Constitutional:      General: She is not in acute distress.    Appearance: Normal appearance.  HENT:     Head: Normocephalic and atraumatic.  Eyes:     General: No scleral icterus.    Conjunctiva/sclera: Conjunctivae normal.  Cardiovascular:     Rate and Rhythm: Normal rate.  Pulmonary:     Effort: Pulmonary effort is normal.  Neurological:  Mental Status: She is alert and oriented to person, place, and time. Mental status is at baseline.  Psychiatric:        Mood and Affect: Mood normal.        Behavior: Behavior normal.      Results for orders placed or performed in visit on 08/01/24  Comprehensive metabolic panel with GFR  Result Value Ref Range   Glucose 76 70 - 99 mg/dL   BUN 8 6 - 20 mg/dL   Creatinine, Ser 9.38 0.57 - 1.00 mg/dL   eGFR 874 >40 fO/fpw/8.26   BUN/Creatinine  Ratio 13 9 - 23   Sodium 141 134 - 144 mmol/L   Potassium 4.3 3.5 - 5.2 mmol/L   Chloride 101 96 - 106 mmol/L   CO2 25 20 - 29 mmol/L   Calcium  10.0 8.7 - 10.2 mg/dL   Total Protein 7.3 6.0 - 8.5 g/dL   Albumin 4.7 4.0 - 5.0 g/dL   Globulin, Total 2.6 1.5 - 4.5 g/dL   Bilirubin Total 0.3 0.0 - 1.2 mg/dL   Alkaline Phosphatase 91 41 - 116 IU/L   AST 17 0 - 40 IU/L   ALT 16 0 - 32 IU/L  Hemoglobin A1c  Result Value Ref Range   Hgb A1c MFr Bld 5.3 4.8 - 5.6 %   Est. average glucose Bld gHb Est-mCnc 105 mg/dL  Lipid panel  Result Value Ref Range   Cholesterol, Total 169 100 - 199 mg/dL   Triglycerides 78 0 - 149 mg/dL   HDL 53 >60 mg/dL   VLDL Cholesterol Cal 15 5 - 40 mg/dL   LDL Chol Calc (NIH) 898 (H) 0 - 99 mg/dL   Chol/HDL Ratio 3.2 0.0 - 4.4 ratio  TSH Rfx on Abnormal to Free T4  Result Value Ref Range   TSH 1.130 0.450 - 4.500 uIU/mL    Assessment & Plan    Transition of care  Generalized anxiety disorder -     PARoxetine HCl; Take 1 tablet (20 mg total) by mouth daily.  Dispense: 30 tablet; Refill: 3  Palpitations -     TSH Rfx on Abnormal to Free T4  Obesity (BMI 35.0-39.9 without comorbidity) -     Hemoglobin A1c  Screening for endocrine, metabolic and immunity disorder -     Comprehensive metabolic panel with GFR -     Hemoglobin A1c  Encounter for screening for cardiovascular disorders -     Lipid panel     Transition of care Patient transitioning from her previous provider who no longer works in the office (least pain).  Generalized anxiety disorder; palpitations Anxiety exacerbated by life changes and medication discontinuation. Concerned about weight gain and breastfeeding safety. Heart palpitations likely anxiety-related as they only occur during times of anxiety. - Prescribed paroxetine, safe for breastfeeding. Monitor child for irritability or weight gain. - Discussed paroxetine side effects: decreased appetite, nausea, diarrhea, dry mouth.  Advised monitoring heart palpitations. - Ordered routine blood work to assess health status. - Discussed switching to caffeine tablets from coffee, advised caution due to higher caffeine content.  Obesity (BMI 35.0-39.9 without comorbidity) Encouraged lifestyle modifications.  Will screen for diabetes with A1c.    Return in about 6 weeks (around 09/12/2024) for Anx/Dep.      I discussed the assessment and treatment plan with the patient  The patient was provided an opportunity to ask questions and all were answered. The patient agreed with the plan and demonstrated an understanding of the instructions.  The patient was advised to call back or seek an in-person evaluation if the symptoms worsen or if the condition fails to improve as anticipated.    LAURAINE LOISE BUOY, DO  [facility] [phone] (phone) [phone] (fax)  [facility]

## 2024-08-02 LAB — COMPREHENSIVE METABOLIC PANEL WITH GFR
ALT: 16 IU/L (ref 0–32)
AST: 17 IU/L (ref 0–40)
Albumin: 4.7 g/dL (ref 4.0–5.0)
Alkaline Phosphatase: 91 IU/L (ref 41–116)
BUN/Creatinine Ratio: 13 (ref 9–23)
BUN: 8 mg/dL (ref 6–20)
Bilirubin Total: 0.3 mg/dL (ref 0.0–1.2)
CO2: 25 mmol/L (ref 20–29)
Calcium: 10 mg/dL (ref 8.7–10.2)
Chloride: 101 mmol/L (ref 96–106)
Creatinine, Ser: 0.61 mg/dL (ref 0.57–1.00)
Globulin, Total: 2.6 g/dL (ref 1.5–4.5)
Glucose: 76 mg/dL (ref 70–99)
Potassium: 4.3 mmol/L (ref 3.5–5.2)
Sodium: 141 mmol/L (ref 134–144)
Total Protein: 7.3 g/dL (ref 6.0–8.5)
eGFR: 125 mL/min/1.73 (ref >59)

## 2024-08-02 LAB — LIPID PANEL
Chol/HDL Ratio: 3.2 ratio (ref 0.0–4.4)
Cholesterol, Total: 169 mg/dL (ref 100–199)
HDL: 53 mg/dL (ref >39)
LDL Chol Calc (NIH): 101 mg/dL — ABNORMAL HIGH (ref 0–99)
Triglycerides: 78 mg/dL (ref 0–149)
VLDL Cholesterol Cal: 15 mg/dL (ref 5–40)

## 2024-08-02 LAB — TSH RFX ON ABNORMAL TO FREE T4: TSH: 1.13 u[IU]/mL (ref 0.450–4.500)

## 2024-08-02 LAB — HEMOGLOBIN A1C
Est. average glucose Bld gHb Est-mCnc: 105 mg/dL
Hgb A1c MFr Bld: 5.3 % (ref 4.8–5.6)

## 2024-08-04 ENCOUNTER — Ambulatory Visit: Payer: Self-pay | Admitting: Family Medicine

## 2024-09-10 ENCOUNTER — Encounter: Payer: Self-pay | Admitting: Family Medicine

## 2024-09-10 ENCOUNTER — Telehealth (INDEPENDENT_AMBULATORY_CARE_PROVIDER_SITE_OTHER): Admitting: Family Medicine

## 2024-09-10 DIAGNOSIS — G43909 Migraine, unspecified, not intractable, without status migrainosus: Secondary | ICD-10-CM | POA: Diagnosis not present

## 2024-09-10 DIAGNOSIS — F411 Generalized anxiety disorder: Secondary | ICD-10-CM | POA: Diagnosis not present

## 2024-09-10 MED ORDER — PAROXETINE HCL 30 MG PO TABS: 30.0000 mg | ORAL_TABLET | Freq: Every day | ORAL | 1 refills | Status: DC

## 2024-09-10 NOTE — Progress Notes (Signed)
 MyChart Video Visit    Virtual Visit via Video Note   This format is felt to be most appropriate for this patient at this time. Physical exam was limited by quality of the video and audio technology used for the visit.   Patient location: Home Provider location: [facility]  I discussed the limitations of evaluation and management by telemedicine and the availability of in person appointments. The patient expressed understanding and agreed to proceed.  Patient: Kimberly Farmer   DOB: 1996-08-21   28 y.o. Female  MRN: [id number] Visit Date: 09/10/2024  Today's healthcare provider: LAURAINE LOISE BUOY, DO   No chief complaint on file.  Subjective    HPI  Kimberly Farmer is a 28 year old female with a history of migraines who presents for follow-up on anxiety and with daily migraines potentially related to recent dental work.  She experiences migraines almost daily, approximately five days a week, which began about a week and a half after undergoing significant dental work. The migraines start on the left side of her head in front and radiate backward, with associated dental pain on the right side that improves as the headache eases, particularly after taking medication and a shower. She is currently taking medication almost daily due to the intensity of the migraines.  She has a history of migraines and was previously on preventative medication, Topamax  (topiramate ) 50 mg, in 2019 and again in 2021. Her past migraines did not include dental pain and were characterized by visual disturbances like seeing 'bubbles and spots.'  She has been pregnant multiple times and does not experience migraines during pregnancy, suggesting a possible hormonal component. She is currently working to wean her son off of breastfeeding and has noticed that migraines tended to return around this time with previous children. She has breastfed four children and typically stops at the two-year  mark.  She has a history of anxiety and depression, for which she is currently taking paroxetine  20 mg daily. Over the past two weeks, she reports feeling better, with improved interest in activities and less frequent feelings of depression or hopelessness. She experiences some anxiety, particularly related to her children, and describes herself as someone whose mind is always active and who tends to worry, but feels this is part of her personality. She describes her life as chaotic but finds comfort in the chaos, although she struggles with relaxation and often feels the need to be doing something productive.    Medications: Outpatient Medications Prior to Visit  Medication Sig   [DISCONTINUED] PARoxetine  (PAXIL ) 20 MG tablet Take 1 tablet (20 mg total) by mouth daily.   No facility-administered medications prior to visit.        Objective    There were no vitals taken for this visit.     Physical Exam Constitutional:      General: She is not in acute distress.    Appearance: Normal appearance.  HENT:     Head: Normocephalic.  Pulmonary:     Effort: Pulmonary effort is normal. No respiratory distress.  Neurological:     Mental Status: She is alert and oriented to person, place, and time. Mental status is at baseline.       Assessment & Plan    Generalized anxiety disorder -     PARoxetine  HCl; Take 1 tablet (30 mg total) by mouth daily.  Dispense: 90 tablet; Refill: 1  Migraine without status migrainosus, not intractable, unspecified migraine type  Generalized anxiety disorder  Improvement in depressive symptoms with increased joyful activities. Normal energy, no appetite changes, good concentration, no suicidal ideation. Anxiety personality-driven with improved control. Reduced compulsive checking behaviors. - Increased paroxetine  to 30 mg daily. - Provided a 90-day supply of paroxetine . - Follow up in three months.  Migraine without status migrainosus, not  intractable, unspecified migraine type Recently restarted, with migraines almost daily, possibly linked to recent dental work or hormonal changes.  No current visual disturbances. Previously on topiramate  but discontinued due to breastfeeding. - Consider follow-up dental evaluation if migraines persist. - Once dental concerns are resolved and her youngest son has weaned off of breast-feeding, consider preventative migraine treatment if still needed.    Return in about 3 months (around 12/11/2024) for Anx/Dep; and in 6 months for [facility] with new provider.     I discussed the assessment and treatment plan with the patient. The patient was provided an opportunity to ask questions and all were answered. The patient agreed with the plan and demonstrated an understanding of the instructions.   The patient was advised to call back or seek an in-person evaluation if the symptoms worsen or if the condition fails to improve as anticipated.  I provided 27 minutes of virtual-face-to-face time during this encounter.   LAURAINE LOISE BUOY, DO [facility] [phone] (phone) [phone] (fax)  [facility]

## 2024-09-14 ENCOUNTER — Encounter: Payer: Self-pay | Admitting: Family Medicine

## 2024-09-17 ENCOUNTER — Telehealth: Admitting: Family Medicine

## 2024-09-17 DIAGNOSIS — F411 Generalized anxiety disorder: Secondary | ICD-10-CM

## 2024-09-17 DIAGNOSIS — G43909 Migraine, unspecified, not intractable, without status migrainosus: Secondary | ICD-10-CM

## 2024-09-17 MED ORDER — ESCITALOPRAM OXALATE 5 MG PO TABS: ORAL_TABLET | ORAL | 1 refills | Status: AC

## 2024-09-17 MED ORDER — PROPRANOLOL HCL 20 MG PO TABS: 20.0000 mg | ORAL_TABLET | Freq: Two times a day (BID) | ORAL | 1 refills | Status: AC

## 2024-09-17 NOTE — Progress Notes (Signed)
 MyChart Video Visit    Virtual Visit via Video Note   This format is felt to be most appropriate for this patient at this time. Physical exam was limited by quality of the video and audio technology used for the visit.   Patient location: home Provider location: [facility]  I discussed the limitations of evaluation and management by telemedicine and the availability of in person appointments. The patient expressed understanding and agreed to proceed.  Patient: Kimberly Farmer   DOB: 09/09/1996   28 y.o. Female  MRN: [id number] Visit Date: 09/17/2024  Today's healthcare provider: Nancyann Groom, MD   No chief complaint on file.  Subjective    HPI  Discussed the use of AI scribe software for clinical note transcription with the patient, who gave verbal consent to proceed.  History of Present Illness   Kimberly Farmer is a 28 year old female who presents with recurrent headaches which she suspects to be hormonal migraines.   She experiences recurrent headaches that do not occur during her pregnancies or the first 15 months of breastfeeding. The headaches return between the 15 to 51-month mark of breastfeeding, a pattern consistent with each of her four children.  Currently breastfeeding, she notes the headaches have begun to replicate, occurring about five times a week. Previously, she experienced visual changes described as 'bubbles', but now she experiences flickering lights or odd visual sensations, such as when looking down to wash her hands.  She was on paroxetine  for anxiety, started approximately two months ago, but discontinued it due to increased headache severity. Prior to paroxetine , she was on Prozac . She is not taking any hormones or birth control pills and had her tubes removed during her last C-section.       Medications: Outpatient Medications Prior to Visit  Medication Sig Note   [DISCONTINUED] PARoxetine  (PAXIL ) 30 MG tablet Take 1 tablet (30 mg total) by mouth  daily. 09/17/2024: to escitalopram due to worsening headaches   No facility-administered medications prior to visit.        Objective    There were no vitals taken for this visit.   Physical Exam  Awake, alert, oriented x 3. In no apparent distress   Assessment & Plan     1. Migraine without status migrainosus, not intractable, unspecified migraine type (Primary)  - propranolol  (INDERAL ) 20 MG tablet; Take 1 tablet (20 mg total) by mouth 2 (two) times daily. May increase to 2 tablets twice a day after 2 weeks if needed for prevention of migraine headaches  Dispense: 60 tablet; Refill: 1   2. Generalized anxiety disorder Stopped paroxetine  because she felt that it was making migraines worse. Tried sertraline  and fluoxetine  in the past which she did not feel were effective.   Try escitalopram (LEXAPRO) 5 MG tablet; Take 1/2 tablet daily for 6 days, then increase to 1 tablet daily for 1 week, then increase to 2 tablets daily  Dispense: 30 tablet; Refill: 1     I discussed the assessment and treatment plan with the patient. The patient was provided an opportunity to ask questions and all were answered. The patient agreed with the plan and demonstrated an understanding of the instructions.   The patient was advised to call back or seek an in-person evaluation if the symptoms worsen or if the condition fails to improve as anticipated.  I provided 20 minutes of non-face-to-face time during this encounter.   Nancyann Freimark, MD [facility] [phone] (phone)  [phone] (fax)  [facility]

## 2024-12-12 ENCOUNTER — Ambulatory Visit: Admitting: Family Medicine

## 2025-03-13 ENCOUNTER — Encounter: Admitting: Family Medicine

## 2025-03-27 ENCOUNTER — Encounter: Admitting: Family Medicine
